# Patient Record
Sex: Male | Born: 1945
Health system: Southern US, Community
[De-identification: ages and names within clinical notes are randomized; demographics above are authoritative.]

## PROBLEM LIST (undated history)

## (undated) DIAGNOSIS — R053 Chronic cough: Secondary | ICD-10-CM

## (undated) DIAGNOSIS — U071 COVID-19: Secondary | ICD-10-CM

## (undated) DIAGNOSIS — C801 Malignant (primary) neoplasm, unspecified: Secondary | ICD-10-CM

## (undated) DIAGNOSIS — I499 Cardiac arrhythmia, unspecified: Secondary | ICD-10-CM

## (undated) DIAGNOSIS — R05 Cough: Secondary | ICD-10-CM

## (undated) HISTORY — DX: Chronic cough: R05.3

## (undated) HISTORY — PX: SKIN SURGERY: SHX2413

## (undated) HISTORY — PX: HERNIA REPAIR: SHX51

---
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
## 2010-06-28 HISTORY — PX: ACHILLES TENDON REPAIR: SUR1153

## 2015-04-03 DIAGNOSIS — R69 Illness, unspecified: Secondary | ICD-10-CM | POA: Diagnosis not present

## 2015-09-01 DIAGNOSIS — M25562 Pain in left knee: Secondary | ICD-10-CM | POA: Diagnosis not present

## 2015-09-01 DIAGNOSIS — G8929 Other chronic pain: Secondary | ICD-10-CM | POA: Diagnosis not present

## 2015-10-02 DIAGNOSIS — R69 Illness, unspecified: Secondary | ICD-10-CM | POA: Diagnosis not present

## 2015-11-09 DIAGNOSIS — R062 Wheezing: Secondary | ICD-10-CM | POA: Diagnosis not present

## 2015-11-09 DIAGNOSIS — J069 Acute upper respiratory infection, unspecified: Secondary | ICD-10-CM | POA: Diagnosis not present

## 2015-11-09 DIAGNOSIS — J209 Acute bronchitis, unspecified: Secondary | ICD-10-CM | POA: Diagnosis not present

## 2015-12-01 DIAGNOSIS — M25562 Pain in left knee: Secondary | ICD-10-CM

## 2015-12-01 DIAGNOSIS — M1712 Unilateral primary osteoarthritis, left knee: Secondary | ICD-10-CM | POA: Diagnosis not present

## 2015-12-01 DIAGNOSIS — G8929 Other chronic pain: Secondary | ICD-10-CM | POA: Insufficient documentation

## 2015-12-01 HISTORY — DX: Other chronic pain: G89.29

## 2015-12-01 HISTORY — DX: Pain in left knee: M25.562

## 2016-02-08 DIAGNOSIS — R05 Cough: Secondary | ICD-10-CM | POA: Diagnosis not present

## 2016-04-22 DIAGNOSIS — I10 Essential (primary) hypertension: Secondary | ICD-10-CM | POA: Diagnosis not present

## 2016-04-22 DIAGNOSIS — E785 Hyperlipidemia, unspecified: Secondary | ICD-10-CM | POA: Diagnosis not present

## 2016-04-22 DIAGNOSIS — Z9181 History of falling: Secondary | ICD-10-CM | POA: Diagnosis not present

## 2016-04-22 DIAGNOSIS — Z23 Encounter for immunization: Secondary | ICD-10-CM | POA: Diagnosis not present

## 2016-04-22 DIAGNOSIS — Z1389 Encounter for screening for other disorder: Secondary | ICD-10-CM | POA: Diagnosis not present

## 2016-04-22 DIAGNOSIS — Z79899 Other long term (current) drug therapy: Secondary | ICD-10-CM | POA: Diagnosis not present

## 2016-04-22 DIAGNOSIS — Z0000 Encounter for general adult medical examination without abnormal findings: Secondary | ICD-10-CM | POA: Diagnosis not present

## 2016-04-22 DIAGNOSIS — D510 Vitamin B12 deficiency anemia due to intrinsic factor deficiency: Secondary | ICD-10-CM | POA: Diagnosis not present

## 2016-09-02 DIAGNOSIS — M1712 Unilateral primary osteoarthritis, left knee: Secondary | ICD-10-CM | POA: Diagnosis not present

## 2016-09-02 DIAGNOSIS — G8929 Other chronic pain: Secondary | ICD-10-CM | POA: Diagnosis not present

## 2016-10-26 DIAGNOSIS — R69 Illness, unspecified: Secondary | ICD-10-CM | POA: Diagnosis not present

## 2017-03-30 DIAGNOSIS — S79911A Unspecified injury of right hip, initial encounter: Secondary | ICD-10-CM | POA: Diagnosis not present

## 2017-03-30 DIAGNOSIS — S39012A Strain of muscle, fascia and tendon of lower back, initial encounter: Secondary | ICD-10-CM | POA: Diagnosis not present

## 2017-03-30 DIAGNOSIS — S3992XA Unspecified injury of lower back, initial encounter: Secondary | ICD-10-CM | POA: Diagnosis not present

## 2017-03-30 DIAGNOSIS — S79912A Unspecified injury of left hip, initial encounter: Secondary | ICD-10-CM | POA: Diagnosis not present

## 2017-03-30 DIAGNOSIS — W03XXXA Other fall on same level due to collision with another person, initial encounter: Secondary | ICD-10-CM | POA: Diagnosis not present

## 2017-03-30 DIAGNOSIS — M62830 Muscle spasm of back: Secondary | ICD-10-CM | POA: Diagnosis not present

## 2017-04-06 DIAGNOSIS — S060X9A Concussion with loss of consciousness of unspecified duration, initial encounter: Secondary | ICD-10-CM | POA: Diagnosis not present

## 2017-05-25 DIAGNOSIS — Z79899 Other long term (current) drug therapy: Secondary | ICD-10-CM | POA: Diagnosis not present

## 2017-05-25 DIAGNOSIS — E785 Hyperlipidemia, unspecified: Secondary | ICD-10-CM | POA: Diagnosis not present

## 2017-05-25 DIAGNOSIS — Z0000 Encounter for general adult medical examination without abnormal findings: Secondary | ICD-10-CM | POA: Diagnosis not present

## 2017-05-25 DIAGNOSIS — Z125 Encounter for screening for malignant neoplasm of prostate: Secondary | ICD-10-CM | POA: Diagnosis not present

## 2017-05-26 DIAGNOSIS — R69 Illness, unspecified: Secondary | ICD-10-CM | POA: Diagnosis not present

## 2017-06-01 DIAGNOSIS — Z0000 Encounter for general adult medical examination without abnormal findings: Secondary | ICD-10-CM | POA: Diagnosis not present

## 2017-06-01 DIAGNOSIS — Z1331 Encounter for screening for depression: Secondary | ICD-10-CM | POA: Diagnosis not present

## 2017-06-01 DIAGNOSIS — Z23 Encounter for immunization: Secondary | ICD-10-CM | POA: Diagnosis not present

## 2017-06-01 DIAGNOSIS — Z9181 History of falling: Secondary | ICD-10-CM | POA: Diagnosis not present

## 2017-06-01 DIAGNOSIS — Z6837 Body mass index (BMI) 37.0-37.9, adult: Secondary | ICD-10-CM | POA: Diagnosis not present

## 2017-06-01 DIAGNOSIS — Z1339 Encounter for screening examination for other mental health and behavioral disorders: Secondary | ICD-10-CM | POA: Diagnosis not present

## 2017-10-12 DIAGNOSIS — Z7982 Long term (current) use of aspirin: Secondary | ICD-10-CM | POA: Diagnosis not present

## 2017-10-12 DIAGNOSIS — J189 Pneumonia, unspecified organism: Secondary | ICD-10-CM | POA: Diagnosis not present

## 2017-10-12 DIAGNOSIS — J309 Allergic rhinitis, unspecified: Secondary | ICD-10-CM | POA: Diagnosis not present

## 2017-10-12 DIAGNOSIS — I10 Essential (primary) hypertension: Secondary | ICD-10-CM | POA: Diagnosis not present

## 2017-10-12 DIAGNOSIS — R05 Cough: Secondary | ICD-10-CM | POA: Diagnosis not present

## 2018-04-01 DIAGNOSIS — J668 Airway disease due to other specific organic dusts: Secondary | ICD-10-CM | POA: Diagnosis not present

## 2018-05-29 DIAGNOSIS — Z23 Encounter for immunization: Secondary | ICD-10-CM | POA: Diagnosis not present

## 2018-06-01 DIAGNOSIS — R69 Illness, unspecified: Secondary | ICD-10-CM | POA: Diagnosis not present

## 2018-11-29 DIAGNOSIS — R69 Illness, unspecified: Secondary | ICD-10-CM | POA: Diagnosis not present

## 2018-11-30 DIAGNOSIS — R69 Illness, unspecified: Secondary | ICD-10-CM | POA: Diagnosis not present

## 2018-12-13 DIAGNOSIS — R69 Illness, unspecified: Secondary | ICD-10-CM | POA: Diagnosis not present

## 2018-12-26 DIAGNOSIS — R69 Illness, unspecified: Secondary | ICD-10-CM | POA: Diagnosis not present

## 2019-01-18 DIAGNOSIS — R69 Illness, unspecified: Secondary | ICD-10-CM | POA: Diagnosis not present

## 2019-04-23 DIAGNOSIS — Z23 Encounter for immunization: Secondary | ICD-10-CM | POA: Diagnosis not present

## 2019-07-08 DIAGNOSIS — I10 Essential (primary) hypertension: Secondary | ICD-10-CM | POA: Diagnosis not present

## 2019-07-08 DIAGNOSIS — Z79899 Other long term (current) drug therapy: Secondary | ICD-10-CM | POA: Diagnosis not present

## 2019-07-08 DIAGNOSIS — R9431 Abnormal electrocardiogram [ECG] [EKG]: Secondary | ICD-10-CM | POA: Diagnosis not present

## 2019-07-08 DIAGNOSIS — R0602 Shortness of breath: Secondary | ICD-10-CM | POA: Diagnosis not present

## 2019-07-08 DIAGNOSIS — J1282 Pneumonia due to coronavirus disease 2019: Secondary | ICD-10-CM | POA: Diagnosis not present

## 2019-07-08 DIAGNOSIS — R0902 Hypoxemia: Secondary | ICD-10-CM | POA: Diagnosis not present

## 2019-07-08 DIAGNOSIS — J1289 Other viral pneumonia: Secondary | ICD-10-CM | POA: Diagnosis not present

## 2019-07-08 DIAGNOSIS — U071 COVID-19: Secondary | ICD-10-CM | POA: Diagnosis not present

## 2019-07-08 DIAGNOSIS — M1990 Unspecified osteoarthritis, unspecified site: Secondary | ICD-10-CM | POA: Diagnosis not present

## 2019-07-08 DIAGNOSIS — E871 Hypo-osmolality and hyponatremia: Secondary | ICD-10-CM | POA: Diagnosis not present

## 2019-07-08 DIAGNOSIS — Z6837 Body mass index (BMI) 37.0-37.9, adult: Secondary | ICD-10-CM | POA: Diagnosis not present

## 2019-07-08 DIAGNOSIS — R05 Cough: Secondary | ICD-10-CM | POA: Diagnosis not present

## 2019-07-08 DIAGNOSIS — E669 Obesity, unspecified: Secondary | ICD-10-CM | POA: Diagnosis not present

## 2019-07-08 DIAGNOSIS — I4891 Unspecified atrial fibrillation: Secondary | ICD-10-CM | POA: Diagnosis not present

## 2019-07-09 DIAGNOSIS — E871 Hypo-osmolality and hyponatremia: Secondary | ICD-10-CM | POA: Diagnosis not present

## 2019-07-09 DIAGNOSIS — I4891 Unspecified atrial fibrillation: Secondary | ICD-10-CM | POA: Diagnosis not present

## 2019-07-09 DIAGNOSIS — R0902 Hypoxemia: Secondary | ICD-10-CM | POA: Diagnosis not present

## 2019-07-09 DIAGNOSIS — E669 Obesity, unspecified: Secondary | ICD-10-CM | POA: Diagnosis not present

## 2019-07-09 DIAGNOSIS — U071 COVID-19: Secondary | ICD-10-CM | POA: Diagnosis not present

## 2019-07-09 DIAGNOSIS — I10 Essential (primary) hypertension: Secondary | ICD-10-CM | POA: Diagnosis not present

## 2019-07-18 DIAGNOSIS — Z9119 Patient's noncompliance with other medical treatment and regimen: Secondary | ICD-10-CM | POA: Diagnosis not present

## 2019-07-18 DIAGNOSIS — U071 COVID-19: Secondary | ICD-10-CM | POA: Diagnosis not present

## 2019-07-18 DIAGNOSIS — I10 Essential (primary) hypertension: Secondary | ICD-10-CM | POA: Diagnosis not present

## 2019-07-18 DIAGNOSIS — Y708 Miscellaneous anesthesiology devices associated with adverse incidents, not elsewhere classified: Secondary | ICD-10-CM | POA: Diagnosis not present

## 2019-07-18 DIAGNOSIS — Z6837 Body mass index (BMI) 37.0-37.9, adult: Secondary | ICD-10-CM | POA: Diagnosis not present

## 2019-07-18 DIAGNOSIS — I4891 Unspecified atrial fibrillation: Secondary | ICD-10-CM | POA: Diagnosis not present

## 2019-07-25 ENCOUNTER — Other Ambulatory Visit: Payer: Self-pay

## 2019-07-25 ENCOUNTER — Encounter: Payer: Self-pay | Admitting: *Deleted

## 2019-07-25 ENCOUNTER — Ambulatory Visit (INDEPENDENT_AMBULATORY_CARE_PROVIDER_SITE_OTHER): Payer: Medicare HMO | Admitting: Cardiology

## 2019-07-25 VITALS — BP 138/86 | HR 112 | Ht 71.5 in | Wt 264.0 lb

## 2019-07-25 DIAGNOSIS — I4811 Longstanding persistent atrial fibrillation: Secondary | ICD-10-CM | POA: Diagnosis not present

## 2019-07-25 DIAGNOSIS — I10 Essential (primary) hypertension: Secondary | ICD-10-CM | POA: Diagnosis not present

## 2019-07-25 NOTE — Progress Notes (Signed)
Cardiology Office Note:    Date:  07/25/2019   ID:  [name], DOB March 09, 1946, MRN [id number]  PCP:  Angelina Sheriff, MD  Cardiologist:  Berniece Salines, DO  Electrophysiologist:  None   Referring MD: Angelina Sheriff, MD   Chief Complaint  Patient presents with  . Irregular Heart Beat    History of Present Illness:    Thomas Knight is a 74 y.o. male with a hx of atrial fibrillation (recently diagnosed now on metoprolol tartrate 25 mg twice a day with Eliquis 5 mg twice daily), hypertension, recently COVID-19 infection presents today to be evaluated postoperatively.  Patient initially presented to [facility] on July 08, 2019 to be evaluated for tenuous coughing.  He was found to be positive for COVID-19.  In addition he was noted to be in atrial fibrillation.  He was seen by cardiology who recommended patient get a transthoracic echocardiogram and was treated for rate control and anticoagulated for atrial fibrillation.  He is here today to establish cardiac care.  Past Medical History:  Diagnosis Date  . Chronic cough   . Chronic pain of left knee 12/01/2015    Past Surgical History:  Procedure Laterality Date  . ACHILLES TENDON REPAIR Left 2012   Ruptured  . HERNIA REPAIR     umbilical hernia  . SKIN SURGERY Left    Basal cell carcinoma on left forearm    Current Medications: Current Meds  Medication Sig  . Ascorbic Acid (VITAMIN C WITH ROSE HIPS) 500 MG tablet Take 500 mg by mouth daily.  Marland Kitchen aspirin 81 MG EC tablet Take 81 mg by mouth daily.  . chlorpheniramine-HYDROcodone (TUSSIONEX) 10-8 MG/5ML SUER Take 5 mLs by mouth 2 (two) times daily as needed.  . Cholecalciferol (D3-1000) 25 MCG (1000 UT) capsule Take 1,000 Units by mouth daily.  Marland Kitchen dextromethorphan-guaiFENesin (ROBITUSSIN-DM) 10-100 MG/5ML liquid Take 10 mLs by mouth every 4 (four) hours as needed for cough.  Marland Kitchen ELIQUIS 5 MG TABS tablet Take 5 mg by mouth 2 (two) times daily.  . metoprolol  tartrate (LOPRESSOR) 25 MG tablet Take 25 mg by mouth 2 (two) times daily.  . Zinc 50 MG CAPS Take by mouth.     Allergies:   Patient has no known allergies.   Social History   Socioeconomic History  . Marital status: Unknown    Spouse name: Not on file  . Number of children: Not on file  . Years of education: Not on file  . Highest education level: Not on file  Occupational History  . Not on file  Tobacco Use  . Smoking status: Never Smoker  . Smokeless tobacco: Never Used  Substance and Sexual Activity  . Alcohol use: Never  . Drug use: Never  . Sexual activity: Not on file  Other Topics Concern  . Not on file  Social History Narrative  . Not on file   Social Determinants of Health   Financial Resource Strain:   . Difficulty of Paying Living Expenses: Not on file  Food Insecurity:   . Worried About Charity fundraiser in the Last Year: Not on file  . Ran Out of Food in the Last Year: Not on file  Transportation Needs:   . Lack of Transportation (Medical): Not on file  . Lack of Transportation (Non-Medical): Not on file  Physical Activity:   . Days of Exercise per Week: Not on file  . Minutes of Exercise per Session: Not on file  Stress:   . Feeling of Stress : Not on file  Social Connections:   . Frequency of Communication with Friends and Family: Not on file  . Frequency of Social Gatherings with Friends and Family: Not on file  . Attends Religious Services: Not on file  . Active Member of Clubs or Organizations: Not on file  . Attends Archivist Meetings: Not on file  . Marital Status: Not on file     Family History: The patient's family history includes Lung cancer in his father.  ROS:   Review of Systems  Constitution: Negative for decreased appetite, fever and weight gain.  HENT: Negative for congestion, ear discharge, hoarse voice and sore throat.   Eyes: Negative for discharge, redness, vision loss in right eye and visual halos.    Cardiovascular: Negative for chest pain, dyspnea on exertion, leg swelling, orthopnea and palpitations.  Respiratory: Negative for cough, hemoptysis, shortness of breath and snoring.   Endocrine: Negative for heat intolerance and polyphagia.  Hematologic/Lymphatic: Negative for bleeding problem. Does not bruise/bleed easily.  Skin: Negative for flushing, nail changes, rash and suspicious lesions.  Musculoskeletal: Negative for arthritis, joint pain, muscle cramps, myalgias, neck pain and stiffness.  Gastrointestinal: Negative for abdominal pain, bowel incontinence, diarrhea and excessive appetite.  Genitourinary: Negative for decreased libido, genital sores and incomplete emptying.  Neurological: Negative for brief paralysis, focal weakness, headaches and loss of balance.  Psychiatric/Behavioral: Negative for altered mental status, depression and suicidal ideas.  Allergic/Immunologic: Negative for HIV exposure and persistent infections.    EKGs/Labs/Other Studies Reviewed:    The following studies were reviewed today:   EKG:  The ekg ordered today demonstrates atrial fibrillation, with controlled ventricular rate, heart rate 78 bpm-compared to his EKG from [facility] no significant change.  Transthoracic echocardiogram normal LV function EF 55 to 60%.  Right ventricle is mildly enlarged.  Left atrium is moderately dilated.  There is mild aortic valve sclerosis.  There is trace mitral vegetation.  Mild tricuspid regurgitation.   Recent Labs: No results found for requested labs within last 8760 hours.  Recent Lipid Panel No results found for: CHOL, TRIG, HDL, CHOLHDL, VLDL, LDLCALC, LDLDIRECT  Physical Exam:    VS:  BP 138/86 (BP Location: Right Arm, Patient Position: Sitting, Cuff Size: Normal)   Pulse (!) 112   Ht 5' 11.5" (1.816 m)   Wt 264 lb (119.7 kg)   SpO2 100%   BMI 36.31 kg/m     Wt Readings from Last 3 Encounters:  07/25/19 264 lb (119.7 kg)     GEN: Very  pleasant, obese, well nourished, well developed in no acute distress HEENT: Normal NECK: No JVD; No carotid bruits LYMPHATICS: No lymphadenopathy CARDIAC: S1S2 noted,RRR, no murmurs, rubs, gallops RESPIRATORY:  Clear to auscultation without rales, wheezing or rhonchi  ABDOMEN: Soft, non-tender, non-distended, +bowel sounds, no guarding. EXTREMITIES: No edema, No cyanosis, no clubbing MUSCULOSKELETAL:  No deformity  SKIN: Warm and dry NEUROLOGIC:  Alert and oriented x 3, non-focal PSYCHIATRIC:  Normal affect, good insight  ASSESSMENT:    1. Longstanding persistent atrial fibrillation ([place])   2. Essential hypertension    PLAN:     1.  Atrial fibrillation-discussed heavily with the patient today management for his A. fib currently he is on rate control strategy with his Lopressor 25 mg daily.  He is also being anticoagulated with Eliquis 5 mg twice daily.  I have educated patient on the side effects of this medication. I also urge  him to abstain from any taking behaviors.  He understands that he is now at a high risk of bleeding due to being on anticoagulation.  He was also advised that if he ever falls and especially hit his head to be seen in the emergency department.  In addition we discussed rhythm control strategy.  The goal will be to keep patient in sinus rhythm.  For now I would like to first perform a DC cardioversion since he has been anticoagulated and by the time he undergo his procedure he would have been anticoagulated for a month.  After DC cardioversion arrhythmics will be started.  For now the patient will prefer to discuss this with his wife and adult children prior to undergoing any procedure.  This is not unreasonable.  2 hypertension.  We discussed his blood pressure was slightly elevated and for now he will decrease salt intake and we will reassess this in 1 month at his follow-up visit if he is still not at his target of less less than 130/80 we will proceed with giving  in addition antihypertensive.  The patient is in agreement with the above plan. The patient left the office in stable condition.  The patient will follow up in 1 month or sooner if needed.   Medication Adjustments/Labs and Tests Ordered: Current medicines are reviewed at length with the patient today.  Concerns regarding medicines are outlined above.  Orders Placed This Encounter  Procedures  . EKG 12-Lead   No orders of the defined types were placed in this encounter.   Patient Instructions  Medication Instructions:  Your physician recommends that you continue on your current medications as directed. Please refer to the Current Medication list given to you today.  *If you need a refill on your cardiac medications before your next appointment, please call your pharmacy*  Lab Work: None If you have labs (blood work) drawn today and your tests are completely normal, you will receive your results only by: Marland Kitchen MyChart Message (if you have MyChart) OR . A paper copy in the mail If you have any lab test that is abnormal or we need to change your treatment, we will call you to review the results.  Testing/Procedures: Your physician has recommended that you have a Cardioversion (DCCV). Electrical Cardioversion uses a jolt of electricity to your heart either through paddles or wired patches attached to your chest. This is a controlled, usually prescheduled, procedure. Defibrillation is done under light anesthesia in the hospital, and you usually go home the day of the procedure. This is done to get your heart back into a normal rhythm. You are not awake for the procedure. Please see the instruction sheet given to you today.    Follow-Up: At [facility], you and your health needs are our priority.  As part of our continuing mission to provide you with exceptional heart care, we have created designated Provider Care Teams.  These Care Teams include your primary Cardiologist (physician) and  Advanced Practice Providers (APPs -  Physician Assistants and Nurse Practitioners) who all work together to provide you with the care you need, when you need it.  Your next appointment:   1 month(s)  The format for your next appointment:   In Person  Provider:   Berniece Salines, DO  Other Instructions  Electrical Cardioversion Electrical cardioversion is the delivery of a jolt of electricity to restore a normal rhythm to the heart. A rhythm that is too fast or is not regular keeps  the heart from pumping well. In this procedure, sticky patches or metal paddles are placed on the chest to deliver electricity to the heart from a device. This procedure may be done in an emergency if:  There is low or no blood pressure as a result of the heart rhythm.  Normal rhythm must be restored as fast as possible to protect the brain and heart from further damage.  It may save a life. This may also be a scheduled procedure for irregular or fast heart rhythms that are not immediately life-threatening. Tell a health care provider about:  Any allergies you have.  All medicines you are taking, including vitamins, herbs, eye drops, creams, and over-the-counter medicines.  Any problems you or family members have had with anesthetic medicines.  Any blood disorders you have.  Any surgeries you have had.  Any medical conditions you have.  Whether you are pregnant or may be pregnant. What are the risks? Generally, this is a safe procedure. However, problems may occur, including:  Allergic reactions to medicines.  A blood clot that breaks free and travels to other parts of your body.  The possible return of an abnormal heart rhythm within hours or days after the procedure.  Your heart stopping (cardiac arrest). This is rare. What happens before the procedure? Medicines  Your health care provider may have you start taking: ? Blood-thinning medicines (anticoagulants) so your blood does not clot as  easily. ? Medicines to help stabilize your heart rate and rhythm.  Ask your health care provider about: ? Changing or stopping your regular medicines. This is especially important if you are taking diabetes medicines or blood thinners. ? Taking medicines such as aspirin and ibuprofen. These medicines can thin your blood. Do not take these medicines unless your health care provider tells you to take them. ? Taking over-the-counter medicines, vitamins, herbs, and supplements. General instructions  Follow instructions from your health care provider about eating or drinking restrictions.  Plan to have someone take you home from the hospital or clinic.  If you will be going home right after the procedure, plan to have someone with you for 24 hours.  Ask your health care provider what steps will be taken to help prevent infection. These may include washing your skin with a germ-killing soap. What happens during the procedure?   An IV will be inserted into one of your veins.  Sticky patches (electrodes) or metal paddles may be placed on your chest.  You will be given a medicine to help you relax (sedative).  An electrical shock will be delivered. The procedure may vary among health care providers and hospitals. What can I expect after the procedure?  Your blood pressure, heart rate, breathing rate, and blood oxygen level will be monitored until you leave the hospital or clinic.  Your heart rhythm will be watched to make sure it does not change.  You may have some redness on the skin where the shocks were given. Follow these instructions at home:  Do not drive for 24 hours if you were given a sedative during your procedure.  Take over-the-counter and prescription medicines only as told by your health care provider.  Ask your health care provider how to check your pulse. Check it often.  Rest for 48 hours after the procedure or as told by your health care provider.  Avoid or limit  your caffeine use as told by your health care provider.  Keep all follow-up visits as told by your  health care provider. This is important. Contact a health care provider if:  You feel like your heart is beating too quickly or your pulse is not regular.  You have a serious muscle cramp that does not go away. Get help right away if:  You have discomfort in your chest.  You are dizzy or you feel faint.  You have trouble breathing or you are short of breath.  Your speech is slurred.  You have trouble moving an arm or leg on one side of your body.  Your fingers or toes turn cold or blue. Summary  Electrical cardioversion is the delivery of a jolt of electricity to restore a normal rhythm to the heart.  This procedure may be done right away in an emergency or may be a scheduled procedure if the condition is not an emergency.  Generally, this is a safe procedure.  After the procedure, check your pulse often as told by your health care provider. This information is not intended to replace advice given to you by your health care provider. Make sure you discuss any questions you have with your health care provider. Document Revised: 01/15/2019 Document Reviewed: 01/15/2019 Elsevier Patient Education  [place].      Adopting a Healthy Lifestyle.  Know what a healthy weight is for you (roughly BMI <25) and aim to maintain this   Aim for 7+ servings of fruits and vegetables daily   65-80+ fluid ounces of water or unsweet tea for healthy kidneys   Limit to max 1 drink of alcohol per day; avoid smoking/tobacco   Limit animal fats in diet for cholesterol and heart health - choose grass fed whenever available   Avoid highly processed foods, and foods high in saturated/trans fats   Aim for low stress - take time to unwind and care for your mental health   Aim for 150 min of moderate intensity exercise weekly for heart health, and weights twice weekly for bone health     Aim for 7-9 hours of sleep daily   When it comes to diets, agreement about the perfect plan isnt easy to find, even among the experts. Experts at the [employer] developed an idea known as the Healthy Eating Plate. Just imagine a plate divided into logical, healthy portions.   The emphasis is on diet quality:   Load up on vegetables and fruits - one-half of your plate: Aim for color and variety, and remember that potatoes dont count.   Go for whole grains - one-quarter of your plate: Whole wheat, barley, wheat berries, quinoa, oats, brown rice, and foods made with them. If you want pasta, go with whole wheat pasta.   Protein power - one-quarter of your plate: Fish, chicken, beans, and nuts are all healthy, versatile protein sources. Limit red meat.   The diet, however, does go beyond the plate, offering a few other suggestions.   Use healthy plant oils, such as olive, canola, soy, corn, sunflower and peanut. Check the labels, and avoid partially hydrogenated oil, which have unhealthy trans fats.   If youre thirsty, drink water. Coffee and tea are good in moderation, but skip sugary drinks and limit milk and dairy products to one or two daily servings.   The type of carbohydrate in the diet is more important than the amount. Some sources of carbohydrates, such as vegetables, fruits, whole grains, and beans-are healthier than others.   Finally, stay active  Signed, Berniece Salines, DO  07/25/2019  10:20 AM    [facility]

## 2019-07-25 NOTE — Patient Instructions (Signed)
Medication Instructions:  Your physician recommends that you continue on your current medications as directed. Please refer to the Current Medication list given to you today.  *If you need a refill on your cardiac medications before your next appointment, please call your pharmacy*  Lab Work: None If you have labs (blood work) drawn today and your tests are completely normal, you will receive your results only by: Marland Kitchen MyChart Message (if you have MyChart) OR . A paper copy in the mail If you have any lab test that is abnormal or we need to change your treatment, we will call you to review the results.  Testing/Procedures: Your physician has recommended that you have a Cardioversion (DCCV). Electrical Cardioversion uses a jolt of electricity to your heart either through paddles or wired patches attached to your chest. This is a controlled, usually prescheduled, procedure. Defibrillation is done under light anesthesia in the hospital, and you usually go home the day of the procedure. This is done to get your heart back into a normal rhythm. You are not awake for the procedure. Please see the instruction sheet given to you today.    Follow-Up: At [facility], you and your health needs are our priority.  As part of our continuing mission to provide you with exceptional heart care, we have created designated Provider Care Teams.  These Care Teams include your primary Cardiologist (physician) and Advanced Practice Providers (APPs -  Physician Assistants and Nurse Practitioners) who all work together to provide you with the care you need, when you need it.  Your next appointment:   1 month(s)  The format for your next appointment:   In Person  Provider:   Berniece Salines, DO  Other Instructions  Electrical Cardioversion Electrical cardioversion is the delivery of a jolt of electricity to restore a normal rhythm to the heart. A rhythm that is too fast or is not regular keeps the heart from  pumping well. In this procedure, sticky patches or metal paddles are placed on the chest to deliver electricity to the heart from a device. This procedure may be done in an emergency if:  There is low or no blood pressure as a result of the heart rhythm.  Normal rhythm must be restored as fast as possible to protect the brain and heart from further damage.  It may save a life. This may also be a scheduled procedure for irregular or fast heart rhythms that are not immediately life-threatening. Tell a health care provider about:  Any allergies you have.  All medicines you are taking, including vitamins, herbs, eye drops, creams, and over-the-counter medicines.  Any problems you or family members have had with anesthetic medicines.  Any blood disorders you have.  Any surgeries you have had.  Any medical conditions you have.  Whether you are pregnant or may be pregnant. What are the risks? Generally, this is a safe procedure. However, problems may occur, including:  Allergic reactions to medicines.  A blood clot that breaks free and travels to other parts of your body.  The possible return of an abnormal heart rhythm within hours or days after the procedure.  Your heart stopping (cardiac arrest). This is rare. What happens before the procedure? Medicines  Your health care provider may have you start taking: ? Blood-thinning medicines (anticoagulants) so your blood does not clot as easily. ? Medicines to help stabilize your heart rate and rhythm.  Ask your health care provider about: ? Changing or stopping your regular medicines.  This is especially important if you are taking diabetes medicines or blood thinners. ? Taking medicines such as aspirin and ibuprofen. These medicines can thin your blood. Do not take these medicines unless your health care provider tells you to take them. ? Taking over-the-counter medicines, vitamins, herbs, and supplements. General instructions   Follow instructions from your health care provider about eating or drinking restrictions.  Plan to have someone take you home from the hospital or clinic.  If you will be going home right after the procedure, plan to have someone with you for 24 hours.  Ask your health care provider what steps will be taken to help prevent infection. These may include washing your skin with a germ-killing soap. What happens during the procedure?   An IV will be inserted into one of your veins.  Sticky patches (electrodes) or metal paddles may be placed on your chest.  You will be given a medicine to help you relax (sedative).  An electrical shock will be delivered. The procedure may vary among health care providers and hospitals. What can I expect after the procedure?  Your blood pressure, heart rate, breathing rate, and blood oxygen level will be monitored until you leave the hospital or clinic.  Your heart rhythm will be watched to make sure it does not change.  You may have some redness on the skin where the shocks were given. Follow these instructions at home:  Do not drive for 24 hours if you were given a sedative during your procedure.  Take over-the-counter and prescription medicines only as told by your health care provider.  Ask your health care provider how to check your pulse. Check it often.  Rest for 48 hours after the procedure or as told by your health care provider.  Avoid or limit your caffeine use as told by your health care provider.  Keep all follow-up visits as told by your health care provider. This is important. Contact a health care provider if:  You feel like your heart is beating too quickly or your pulse is not regular.  You have a serious muscle cramp that does not go away. Get help right away if:  You have discomfort in your chest.  You are dizzy or you feel faint.  You have trouble breathing or you are short of breath.  Your speech is slurred.  You  have trouble moving an arm or leg on one side of your body.  Your fingers or toes turn cold or blue. Summary  Electrical cardioversion is the delivery of a jolt of electricity to restore a normal rhythm to the heart.  This procedure may be done right away in an emergency or may be a scheduled procedure if the condition is not an emergency.  Generally, this is a safe procedure.  After the procedure, check your pulse often as told by your health care provider. This information is not intended to replace advice given to you by your health care provider. Make sure you discuss any questions you have with your health care provider. Document Revised: 01/15/2019 Document Reviewed: 01/15/2019 Elsevier Patient Education  [place].

## 2019-07-30 ENCOUNTER — Telehealth: Payer: Self-pay | Admitting: *Deleted

## 2019-07-30 ENCOUNTER — Other Ambulatory Visit: Payer: Self-pay | Admitting: *Deleted

## 2019-07-30 DIAGNOSIS — I4811 Longstanding persistent atrial fibrillation: Secondary | ICD-10-CM

## 2019-07-30 DIAGNOSIS — Z01812 Encounter for preprocedural laboratory examination: Secondary | ICD-10-CM

## 2019-07-30 NOTE — Telephone Encounter (Signed)
Telephone call to patient. Talked to him about setting up cardioversion next week while Dr Harriet Masson is in the hospital. He needs labs prior to the procedure. He will come tomorrow(07/31/19) and have a CBC and BMP drawn

## 2019-08-01 DIAGNOSIS — Z01812 Encounter for preprocedural laboratory examination: Secondary | ICD-10-CM | POA: Diagnosis not present

## 2019-08-01 DIAGNOSIS — I4811 Longstanding persistent atrial fibrillation: Secondary | ICD-10-CM | POA: Diagnosis not present

## 2019-08-01 DIAGNOSIS — Z03818 Encounter for observation for suspected exposure to other biological agents ruled out: Secondary | ICD-10-CM | POA: Diagnosis not present

## 2019-08-02 LAB — CBC
Hematocrit: 47.4 % (ref 37.5–51.0)
Hemoglobin: 16.5 g/dL (ref 13.0–17.7)
MCH: 31.4 pg (ref 26.6–33.0)
MCHC: 34.8 g/dL (ref 31.5–35.7)
MCV: 90 fL (ref 79–97)
Platelets: 198 10*3/uL (ref 150–450)
RBC: 5.25 x10E6/uL (ref 4.14–5.80)
RDW: 13 % (ref 11.6–15.4)
WBC: 6.2 10*3/uL (ref 3.4–10.8)

## 2019-08-02 LAB — BASIC METABOLIC PANEL
BUN/Creatinine Ratio: 13 (ref 10–24)
BUN: 13 mg/dL (ref 8–27)
CO2: 23 mmol/L (ref 20–29)
Calcium: 9 mg/dL (ref 8.6–10.2)
Chloride: 105 mmol/L (ref 96–106)
Creatinine, Ser: 1.01 mg/dL (ref 0.76–1.27)
GFR calc Af Amer: 84 mL/min/{1.73_m2} (ref >59)
GFR calc non Af Amer: 73 mL/min/{1.73_m2} (ref >59)
Glucose: 82 mg/dL (ref 65–99)
Potassium: 4.2 mmol/L (ref 3.5–5.2)
Sodium: 142 mmol/L (ref 134–144)

## 2019-08-02 NOTE — Telephone Encounter (Signed)
Telephone call to patient. Informed of lab results and copy sent to [place] for [place] on Thurs 10/07/19 at 9 AM. Informed patient to arrive at hospital at [facility].

## 2019-08-07 ENCOUNTER — Telehealth: Payer: Self-pay | Admitting: *Deleted

## 2019-08-07 NOTE — Telephone Encounter (Signed)
Called patient. Verified date and time of cardioversion at [facility] on 08/09/2019 at [place]. Pt to arrive at 7:30 Am at the [facility] per [place] in the [place]. Informed him that someone from [place] will call and go over pre instructions. No further questions

## 2019-08-08 ENCOUNTER — Telehealth: Payer: Self-pay | Admitting: Cardiology

## 2019-08-08 NOTE — Telephone Encounter (Signed)
New Message    Thomas Knight is calling and says the Covid results are not available yet and they need them before proceeding with the cardioversion     Please advise

## 2019-08-24 ENCOUNTER — Other Ambulatory Visit: Payer: Self-pay

## 2019-08-24 ENCOUNTER — Ambulatory Visit (INDEPENDENT_AMBULATORY_CARE_PROVIDER_SITE_OTHER): Payer: Medicare HMO | Admitting: Cardiology

## 2019-08-24 ENCOUNTER — Encounter: Payer: Self-pay | Admitting: Cardiology

## 2019-08-24 VITALS — BP 130/90 | HR 53 | Ht 71.5 in | Wt 270.0 lb

## 2019-08-24 DIAGNOSIS — E782 Mixed hyperlipidemia: Secondary | ICD-10-CM

## 2019-08-24 DIAGNOSIS — R600 Localized edema: Secondary | ICD-10-CM

## 2019-08-24 DIAGNOSIS — I10 Essential (primary) hypertension: Secondary | ICD-10-CM | POA: Insufficient documentation

## 2019-08-24 DIAGNOSIS — Z01818 Encounter for other preprocedural examination: Secondary | ICD-10-CM | POA: Diagnosis not present

## 2019-08-24 DIAGNOSIS — R0602 Shortness of breath: Secondary | ICD-10-CM | POA: Diagnosis not present

## 2019-08-24 DIAGNOSIS — I4819 Other persistent atrial fibrillation: Secondary | ICD-10-CM

## 2019-08-24 DIAGNOSIS — R0609 Other forms of dyspnea: Secondary | ICD-10-CM | POA: Insufficient documentation

## 2019-08-24 DIAGNOSIS — I4811 Longstanding persistent atrial fibrillation: Secondary | ICD-10-CM | POA: Diagnosis not present

## 2019-08-24 DIAGNOSIS — E669 Obesity, unspecified: Secondary | ICD-10-CM

## 2019-08-24 HISTORY — DX: Localized edema: R60.0

## 2019-08-24 HISTORY — DX: Mixed hyperlipidemia: E78.2

## 2019-08-24 HISTORY — DX: Other persistent atrial fibrillation: I48.19

## 2019-08-24 HISTORY — DX: Shortness of breath: R06.02

## 2019-08-24 HISTORY — DX: Essential (primary) hypertension: I10

## 2019-08-24 HISTORY — DX: Obesity, unspecified: E66.9

## 2019-08-24 LAB — BASIC METABOLIC PANEL
BUN/Creatinine Ratio: 12 (ref 10–24)
BUN: 13 mg/dL (ref 8–27)
CO2: 25 mmol/L (ref 20–29)
Calcium: 9.4 mg/dL (ref 8.6–10.2)
Chloride: 102 mmol/L (ref 96–106)
Creatinine, Ser: 1.06 mg/dL (ref 0.76–1.27)
GFR calc Af Amer: 80 mL/min/{1.73_m2} (ref >59)
GFR calc non Af Amer: 69 mL/min/{1.73_m2} (ref >59)
Glucose: 101 mg/dL — ABNORMAL HIGH (ref 65–99)
Potassium: 4.5 mmol/L (ref 3.5–5.2)
Sodium: 140 mmol/L (ref 134–144)

## 2019-08-24 LAB — MAGNESIUM: Magnesium: 2.3 mg/dL (ref 1.6–2.3)

## 2019-08-24 MED ORDER — FUROSEMIDE 40 MG PO TABS: 40.0000 mg | ORAL_TABLET | Freq: Every day | ORAL | 1 refills | Status: DC

## 2019-08-24 MED ORDER — POTASSIUM CHLORIDE CRYS ER 20 MEQ PO TBCR: 20.0000 meq | EXTENDED_RELEASE_TABLET | Freq: Every day | ORAL | 1 refills | Status: DC

## 2019-08-24 NOTE — Progress Notes (Signed)
Cardiology Office Note:    Date:  08/24/2019   ID:  Thomas Knight, DOB Nov 05, 1945, MRN [id number]  PCP:  Thomas Sheriff, MD  Cardiologist:  Thomas Salines, DO  Electrophysiologist:  None   Referring MD: Thomas Sheriff, MD   Chief Complaint  Patient presents with  . Follow-up   Follow-up for atrial fibrillation  History of Present Illness:    Thomas Knight is a 74 y.o. male with a hx of atrial fibrillation (recently diagnosed now on metoprolol tartrate 25 mg twice a day with Eliquis 5 mg twice daily), hypertension, recently COVID-19 infection presents today to be evaluated postoperatively.  Patient initially presented to [facility] on July 08, 2019 to be evaluated for tenuous coughing.  He was found to be positive for COVID-19.  In addition he was noted to be in atrial fibrillation.  He was seen by cardiology who recommended patient get a transthoracic echocardiogram and was treated for rate control and anticoagulated for atrial fibrillation.  I saw the patient on July 25, 2019 at that time he seems to be doing well post hospitalization.  He was still in atrial fibrillation therefore I discussed with the patient about rhythm control strategy.  Since he has been anticoagulated previously he was willing to undergo DC cardioversion.  We did arrange for the patient undergo his cardioversion however unfortunately the patient did not get his procedure due to missed timing of his COVID-19 testing.  He is here for follow-up visit today.  He tells me that he has been experiencing significant shortness of breath with bilateral leg edema.  He denies any chest pain  Past Medical History:  Diagnosis Date  . Chronic cough   . Chronic pain of left knee 12/01/2015    Past Surgical History:  Procedure Laterality Date  . ACHILLES TENDON REPAIR Left 2012   Ruptured  . HERNIA REPAIR     umbilical hernia  . SKIN SURGERY Left    Basal cell carcinoma on left forearm    Current  Medications: Current Meds  Medication Sig  . Ascorbic Acid (VITAMIN C WITH ROSE HIPS) 500 MG tablet Take 500 mg by mouth daily.  Marland Kitchen aspirin 81 MG EC tablet Take 81 mg by mouth daily.  . chlorpheniramine-HYDROcodone (TUSSIONEX) 10-8 MG/5ML SUER Take 5 mLs by mouth 2 (two) times daily as needed.  . Cholecalciferol (D3-1000) 25 MCG (1000 UT) capsule Take 1,000 Units by mouth daily.  Marland Kitchen dextromethorphan-guaiFENesin (ROBITUSSIN-DM) 10-100 MG/5ML liquid Take 10 mLs by mouth every 4 (four) hours as needed for cough.  Marland Kitchen ELIQUIS 5 MG TABS tablet Take 5 mg by mouth 2 (two) times daily.  . metoprolol tartrate (LOPRESSOR) 25 MG tablet Take 25 mg by mouth 2 (two) times daily.  Marland Kitchen OVER THE COUNTER MEDICATION daily. Instaflex featuring UC-II Collagen/ Relieves discomfort, improves flexibility, joint relief in 7 days  . Zinc 50 MG CAPS Take by mouth.     Allergies:   Patient has no known allergies.   Social History   Socioeconomic History  . Marital status: Married    Spouse name: Not on file  . Number of children: Not on file  . Years of education: Not on file  . Highest education level: Not on file  Occupational History  . Not on file  Tobacco Use  . Smoking status: Never Smoker  . Smokeless tobacco: Never Used  Substance and Sexual Activity  . Alcohol use: Never  . Drug use: Never  . Sexual  activity: Not on file  Other Topics Concern  . Not on file  Social History Narrative  . Not on file   Social Determinants of Health   Financial Resource Strain:   . Difficulty of Paying Living Expenses: Not on file  Food Insecurity:   . Worried About Charity fundraiser in the Last Year: Not on file  . Ran Out of Food in the Last Year: Not on file  Transportation Needs:   . Lack of Transportation (Medical): Not on file  . Lack of Transportation (Non-Medical): Not on file  Physical Activity:   . Days of Exercise per Week: Not on file  . Minutes of Exercise per Session: Not on file  Stress:   .  Feeling of Stress : Not on file  Social Connections:   . Frequency of Communication with Friends and Family: Not on file  . Frequency of Social Gatherings with Friends and Family: Not on file  . Attends Religious Services: Not on file  . Active Member of Clubs [name]: Not on file  . Attends Archivist Meetings: Not on file  . Marital Status: Not on file     Family History: The patient's family history includes Lung cancer in his father.  ROS:   Review of Systems  Constitution: Negative for decreased appetite, fever and weight gain.  HENT: Negative for congestion, ear discharge, hoarse voice and sore throat.   Eyes: Negative for discharge, redness, vision loss in right eye and visual halos.  Cardiovascular: Reports shortness of breath.  Negative for chest pain, leg swelling, orthopnea and palpitations.  Respiratory: Negative for cough, hemoptysis, shortness of breath and snoring.   Endocrine: Negative for heat intolerance and polyphagia.  Hematologic/Lymphatic: Negative for bleeding problem. Does not bruise/bleed easily.  Skin: Negative for flushing, nail changes, rash and suspicious lesions.  Musculoskeletal: Negative for arthritis, joint pain, muscle cramps, myalgias, neck pain and stiffness.  Gastrointestinal: Negative for abdominal pain, bowel incontinence, diarrhea and excessive appetite.  Genitourinary: Negative for decreased libido, genital sores and incomplete emptying.  Neurological: Negative for brief paralysis, focal weakness, headaches and loss of balance.  Psychiatric/Behavioral: Negative for altered mental status, depression and suicidal ideas.  Allergic/Immunologic: Negative for HIV exposure and persistent infections.    EKGs/Labs/Other Studies Reviewed:    The following studies were reviewed today:   EKG:  The ekg ordered today demonstrates atrial fibrillation, heart rate 66 bpm, compared to prior EKG patient remains in atrial  fibrillation.  Transthoracic echocardiogram normal LV function EF 55 to 60%.  Right ventricle is mildly enlarged.  Left atrium is moderately dilated.  There is mild aortic valve sclerosis.  There is trace mitral vegetation.  Mild tricuspid regurgitation.     Recent Labs: 08/01/2019: BUN 13; Creatinine, Ser 1.01; Hemoglobin 16.5; Platelets 198; Potassium 4.2; Sodium 142  Recent Lipid Panel No results found for: CHOL, TRIG, HDL, CHOLHDL, VLDL, LDLCALC, LDLDIRECT  Physical Exam:    VS:  BP 130/90 (BP Location: Right Arm, Patient Position: Sitting, Cuff Size: Normal)   Pulse (!) 53   Ht 5' 11.5" (1.816 m)   Wt 270 lb (122.5 kg)   SpO2 94%   BMI 37.13 kg/m     Wt Readings from Last 3 Encounters:  08/24/19 270 lb (122.5 kg)  07/25/19 264 lb (119.7 kg)     GEN: Well nourished, well developed in no acute distress HEENT: Normal NECK: No JVD; No carotid bruits LYMPHATICS: No lymphadenopathy CARDIAC: S1S2 noted,RRR, no murmurs,  rubs, gallops RESPIRATORY: Mild bibasilar crackles, wheezing Knight rhonchi  ABDOMEN: Soft, non-tender, non-distended, +bowel sounds, no guarding. EXTREMITIES: Bilateral leg edema, No cyanosis, no clubbing MUSCULOSKELETAL:  No deformity  SKIN: Warm and dry NEUROLOGIC:  Alert and oriented x 3, non-focal PSYCHIATRIC:  Normal affect, good insight  ASSESSMENT:    1. Shortness of breath   2. Longstanding persistent atrial fibrillation ([place])   3. Essential hypertension   4. Mixed hyperlipidemia   5. Bilateral leg edema   6. Obesity (BMI 30-39.9)    PLAN:    He is experiencing his shortness of breath which is new.  His physical exam is suggestive of some volume overload with mild bibasilar crackles and bilateral leg edema.  At this time I will start patient on Lasix 40 mg daily and potassium chloride 20 mEq daily.  I also think that his atrial fibrillation may be contributing to his symptoms.  We will monitor the patient while he takes his Lasix for improvement.  In  addition I am hoping to get the patient cardioverted within the next 2 weeks.  I plan to start the patient on antiarrhythmic with amiodarone post cardioversion.  I advised the patient to continue on his anticoagulation with Eliquis.  If his shortness of breath does not improve with his diuretics hopefully post cardioversion where he is in sinus rhythm we will pursue an ischemic evaluation at that time.   He does have some diastolic hypertension in the office today.  But I am starting the patient on Lasix 40 mg daily which I think will also help with his high blood pressure.  He has gained some weight since his last visit which I think is secondary to his retention of fluid.  Blood work will be done today for BMP and mag.  The patient is in agreement with the above plan. The patient left the office in stable condition.  The patient will follow up in 1 month Knight sooner if needed    Medication Adjustments/Labs and Tests Ordered: Current medicines are reviewed at length with the patient today.  Concerns regarding medicines are outlined above.  Orders Placed This Encounter  Procedures  . Basic Metabolic Panel (BMET)  . Magnesium  . EKG 12-Lead   Meds ordered this encounter  Medications  . furosemide (LASIX) 40 MG tablet    Sig: Take 1 tablet (40 mg total) by mouth daily.    Dispense:  90 tablet    Refill:  1  . potassium chloride SA (KLOR-CON M20) 20 MEQ tablet    Sig: Take 1 tablet (20 mEq total) by mouth daily.    Dispense:  90 tablet    Refill:  1    Patient Instructions  Medication Instructions:  Your physician has recommended you make the following change in your medication:   START: Furosemide 40 mg Take 1 tab daily START: Potassium 20 meq Take 1 tab daily  *If you need a refill on your cardiac medications before your next appointment, please call your pharmacy*   Lab Work: Your physician recommends that you return for lab work in: TODAY BMP,Magnesium  If you have labs  (blood work) drawn today and your tests are completely normal, you will receive your results only by: Marland Kitchen MyChart Message (if you have MyChart) Knight . A paper copy in the mail If you have any lab test that is abnormal Knight we need to change your treatment, we will call you to review the results.   Testing/Procedures: Your physician  has recommended that you have a Cardioversion (DCCV). Electrical Cardioversion uses a jolt of electricity to your heart either through paddles Knight wired patches attached to your chest. This is a controlled, usually prescheduled, procedure. Defibrillation is done under light anesthesia in the hospital, and you usually go home the day of the procedure. This is done to get your heart back into a normal rhythm. You are not awake for the procedure. This will be done at  [place]: At [facility], you and your health needs are our priority.  As part of our continuing mission to provide you with exceptional heart care, we have created designated Provider Care Teams.  These Care Teams include your primary Cardiologist (physician) and Advanced Practice Providers (APPs -  Physician Assistants and Nurse Practitioners) who all work together to provide you with the care you need, when you need it.  We recommend signing up for the patient portal called "MyChart".  Sign up information is provided on this After Visit Summary.  MyChart is used to connect with patients for Virtual Visits (Telemedicine).  Patients are able to view lab/test results, encounter notes, upcoming appointments, etc.  Non-urgent messages can be sent to your provider as well.   To learn more about what you can do with MyChart, go to [email or web address].    Your next appointment:   1 month(s)  The format for your next appointment:   In Person  Provider:   Berniece Salines, DO   Other Instructions      Adopting a Healthy Lifestyle.  Know what a healthy weight is for you (roughly BMI  <25) and aim to maintain this   Aim for 7+ servings of fruits and vegetables daily   65-80+ fluid ounces of water Knight unsweet tea for healthy kidneys   Limit to max 1 drink of alcohol per day; avoid smoking/tobacco   Limit animal fats in diet for cholesterol and heart health - choose grass fed whenever available   Avoid highly processed foods, and foods high in saturated/trans fats   Aim for low stress - take time to unwind and care for your mental health   Aim for 150 min of moderate intensity exercise weekly for heart health, and weights twice weekly for bone health   Aim for 7-9 hours of sleep daily   When it comes to diets, agreement about the perfect plan isnt easy to find, even among the experts. Experts at the [employer] developed an idea known as the Healthy Eating Plate. Just imagine a plate divided into logical, healthy portions.   The emphasis is on diet quality:   Load up on vegetables and fruits - one-half of your plate: Aim for color and variety, and remember that potatoes dont count.   Go for whole grains - one-quarter of your plate: Whole wheat, barley, wheat berries, quinoa, oats, brown rice, and foods made with them. If you want pasta, go with whole wheat pasta.   Protein power - one-quarter of your plate: Fish, chicken, beans, and nuts are all healthy, versatile protein sources. Limit red meat.   The diet, however, does go beyond the plate, offering a few other suggestions.   Use healthy plant oils, such as olive, canola, soy, corn, sunflower and peanut. Check the labels, and avoid partially hydrogenated oil, which have unhealthy trans fats.   If youre thirsty, drink water. Coffee and tea are good in moderation, but skip sugary drinks and limit milk  and dairy products to one Knight two daily servings.   The type of carbohydrate in the diet is more important than the amount. Some sources of carbohydrates, such as vegetables, fruits, whole  grains, and beans-are healthier than others.   Finally, stay active  Signed, Thomas Salines, DO  08/24/2019 11:26 AM    [place]

## 2019-08-24 NOTE — Patient Instructions (Signed)
Medication Instructions:  Your physician has recommended you make the following change in your medication:   START: Furosemide 40 mg Take 1 tab daily START: Potassium 20 meq Take 1 tab daily  *If you need a refill on your cardiac medications before your next appointment, please call your pharmacy*   Lab Work: Your physician recommends that you return for lab work in: TODAY BMP,Magnesium  If you have labs (blood work) drawn today and your tests are completely normal, you will receive your results only by: Marland Kitchen MyChart Message (if you have MyChart) OR . A paper copy in the mail If you have any lab test that is abnormal or we need to change your treatment, we will call you to review the results.   Testing/Procedures: Your physician has recommended that you have a Cardioversion (DCCV). Electrical Cardioversion uses a jolt of electricity to your heart either through paddles or wired patches attached to your chest. This is a controlled, usually prescheduled, procedure. Defibrillation is done under light anesthesia in the hospital, and you usually go home the day of the procedure. This is done to get your heart back into a normal rhythm. You are not awake for the procedure. This will be done at  [place]: At [facility], you and your health needs are our priority.  As part of our continuing mission to provide you with exceptional heart care, we have created designated Provider Care Teams.  These Care Teams include your primary Cardiologist (physician) and Advanced Practice Providers (APPs -  Physician Assistants and Nurse Practitioners) who all work together to provide you with the care you need, when you need it.  We recommend signing up for the patient portal called "MyChart".  Sign up information is provided on this After Visit Summary.  MyChart is used to connect with patients for Virtual Visits (Telemedicine).  Patients are able to view lab/test results, encounter  notes, upcoming appointments, etc.  Non-urgent messages can be sent to your provider as well.   To learn more about what you can do with MyChart, go to [email or web address].    Your next appointment:   1 month(s)  The format for your next appointment:   In Person  Provider:   Berniece Salines, DO   Other Instructions

## 2019-08-27 NOTE — Telephone Encounter (Signed)
Called [facility] where he was tested. They are faxing results to me today and I will fax to [facility].Spoke with patient and he is scheduled for Thurs 08/30/19 At 9:30 To arrive at hospital at 7:30.Patient verbalized understanding.

## 2019-08-27 NOTE — Telephone Encounter (Signed)
Mickel Baas could you please take care of this for me thank you.

## 2019-08-30 DIAGNOSIS — I4891 Unspecified atrial fibrillation: Secondary | ICD-10-CM | POA: Diagnosis not present

## 2019-08-30 DIAGNOSIS — E669 Obesity, unspecified: Secondary | ICD-10-CM | POA: Diagnosis not present

## 2019-08-30 DIAGNOSIS — I10 Essential (primary) hypertension: Secondary | ICD-10-CM | POA: Diagnosis not present

## 2019-08-30 DIAGNOSIS — J309 Allergic rhinitis, unspecified: Secondary | ICD-10-CM | POA: Diagnosis not present

## 2019-09-03 ENCOUNTER — Telehealth: Payer: Self-pay | Admitting: Cardiology

## 2019-09-03 NOTE — Telephone Encounter (Signed)
  Patient has the opportunity to get the covid vaccine today @ 1:15 pm but he had a cardioversion done recently and he wants to know if it is okay for him to get the shot. Please let him know soon as possible so he can go to his appt.  Patient also wants to know what would be the sign or symptoms of afib so he would know what to look for in case he were to go back into afib since the cardioversion.

## 2019-09-03 NOTE — Telephone Encounter (Signed)
Telephone call to patient. Informed no reason he should not get the COVID vaccine today. Went over s/s of atrial fib and that there are times he may not know he is in atrial fib. Pt satisfied

## 2019-09-07 ENCOUNTER — Other Ambulatory Visit: Payer: Self-pay | Admitting: *Deleted

## 2019-09-07 ENCOUNTER — Telehealth: Payer: Self-pay | Admitting: Cardiology

## 2019-09-07 MED ORDER — ELIQUIS 5 MG PO TABS: 5.0000 mg | ORAL_TABLET | Freq: Two times a day (BID) | ORAL | 5 refills | Status: DC

## 2019-09-07 MED ORDER — METOPROLOL TARTRATE 25 MG PO TABS: 25.0000 mg | ORAL_TABLET | Freq: Two times a day (BID) | ORAL | 1 refills | Status: DC

## 2019-09-07 NOTE — Telephone Encounter (Signed)
Refill sent.

## 2019-09-07 NOTE — Telephone Encounter (Signed)
  *  STAT* If patient is at the pharmacy, call can be transferred to refill team.   1. Which medications need to be refilled? (please list name of each medication and dose if known) ELIQUIS 5 MG TABS tablet, metoprolol tartrate (LOPRESSOR) 25 MG tablet  2. Which pharmacy/location (including street and city if local pharmacy) is medication to be sent to? [place], [place]  3. Do they need a 30 day or 90 day supply? 90 days

## 2019-09-10 ENCOUNTER — Other Ambulatory Visit: Payer: Self-pay

## 2019-09-10 MED ORDER — METOPROLOL TARTRATE 25 MG PO TABS: 25.0000 mg | ORAL_TABLET | Freq: Two times a day (BID) | ORAL | 2 refills | Status: DC

## 2019-09-10 MED ORDER — ELIQUIS 5 MG PO TABS: 5.0000 mg | ORAL_TABLET | Freq: Two times a day (BID) | ORAL | 2 refills | Status: DC

## 2019-09-21 ENCOUNTER — Encounter: Payer: Self-pay | Admitting: Cardiology

## 2019-09-21 ENCOUNTER — Ambulatory Visit (INDEPENDENT_AMBULATORY_CARE_PROVIDER_SITE_OTHER): Payer: Medicare HMO | Admitting: Cardiology

## 2019-09-21 ENCOUNTER — Other Ambulatory Visit: Payer: Self-pay

## 2019-09-21 VITALS — BP 150/80 | HR 63 | Ht 71.5 in | Wt 263.0 lb

## 2019-09-21 DIAGNOSIS — E669 Obesity, unspecified: Secondary | ICD-10-CM

## 2019-09-21 DIAGNOSIS — I480 Paroxysmal atrial fibrillation: Secondary | ICD-10-CM

## 2019-09-21 DIAGNOSIS — I10 Essential (primary) hypertension: Secondary | ICD-10-CM | POA: Diagnosis not present

## 2019-09-21 NOTE — Progress Notes (Signed)
Cardiology Office Note:    Date:  09/21/2019   ID:  Thomas Knight, DOB Apr 15, 1946, MRN [id number]  PCP:  Angelina Sheriff, MD  Cardiologist:  Berniece Salines, DO  Electrophysiologist:  None   Referring MD: Angelina Sheriff, MD    Follow up visit   History of Present Illness:    Thomas Knight is a 74 y.o. male with a hx of history of atrial fibrillation on Eliquis 5 mg twice a day and metoprolol tartrate 25 mg twice daily, hypertension, recent COVID-19 infection.  The patient is status post cardioversion from the [facility] with no complications.  He is here today for follow-up visit.  He offers no complaints at this time.  Past Medical History:  Diagnosis Date  . Chronic cough   . Chronic pain of left knee 12/01/2015    Past Surgical History:  Procedure Laterality Date  . ACHILLES TENDON REPAIR Left 2012   Ruptured  . HERNIA REPAIR     umbilical hernia  . SKIN SURGERY Left    Basal cell carcinoma on left forearm    Current Medications: Current Meds  Medication Sig  . Ascorbic Acid (VITAMIN C WITH ROSE HIPS) 500 MG tablet Take 500 mg by mouth daily.  Marland Kitchen aspirin 81 MG EC tablet Take 81 mg by mouth daily.  . Cholecalciferol (D3-1000) 25 MCG (1000 UT) capsule Take 1,000 Units by mouth daily.  Marland Kitchen ELIQUIS 5 MG TABS tablet Take 1 tablet (5 mg total) by mouth 2 (two) times daily.  . furosemide (LASIX) 40 MG tablet Take 1 tablet (40 mg total) by mouth daily.  . metoprolol tartrate (LOPRESSOR) 25 MG tablet Take 1 tablet (25 mg total) by mouth 2 (two) times daily.  Marland Kitchen OVER THE COUNTER MEDICATION daily. Instaflex featuring UC-II Collagen/ Relieves discomfort, improves flexibility, joint relief in 7 days  . potassium chloride SA (KLOR-CON M20) 20 MEQ tablet Take 1 tablet (20 mEq total) by mouth daily.  . Zinc 50 MG CAPS Take by mouth.  . [DISCONTINUED] chlorpheniramine-HYDROcodone (TUSSIONEX) 10-8 MG/5ML SUER Take 5 mLs by mouth 2 (two) times daily as needed.  .  [DISCONTINUED] dextromethorphan-guaiFENesin (ROBITUSSIN-DM) 10-100 MG/5ML liquid Take 10 mLs by mouth every 4 (four) hours as needed for cough.     Allergies:   Patient has no known allergies.   Social History   Socioeconomic History  . Marital status: Married    Spouse name: Not on file  . Number of children: Not on file  . Years of education: Not on file  . Highest education level: Not on file  Occupational History  . Not on file  Tobacco Use  . Smoking status: Never Smoker  . Smokeless tobacco: Never Used  Substance and Sexual Activity  . Alcohol use: Never  . Drug use: Never  . Sexual activity: Not on file  Other Topics Concern  . Not on file  Social History Narrative  . Not on file   Social Determinants of Health   Financial Resource Strain:   . Difficulty of Paying Living Expenses:   Food Insecurity:   . Worried About Charity fundraiser in the Last Year:   . Arboriculturist in the Last Year:   Transportation Needs:   . Film/video editor (Medical):   Marland Kitchen Lack of Transportation (Non-Medical):   Physical Activity:   . Days of Exercise per Week:   . Minutes of Exercise per Session:   Stress:   .  Feeling of Stress :   Social Connections:   . Frequency of Communication with Friends and Family:   . Frequency of Social Gatherings with Friends and Family:   . Attends Religious Services:   . Active Member of Clubs [name]:   . Attends Archivist Meetings:   Marland Kitchen Marital Status:      Family History: The patient's family history includes Lung cancer in his father.  ROS:   Review of Systems  Constitution: Negative for decreased appetite, fever and weight gain.  HENT: Negative for congestion, ear discharge, hoarse voice and sore throat.   Eyes: Negative for discharge, redness, vision loss in right eye and visual halos.  Cardiovascular: Negative for chest pain, dyspnea on exertion, leg swelling, orthopnea and palpitations.  Respiratory: Negative  for cough, hemoptysis, shortness of breath and snoring.   Endocrine: Negative for heat intolerance and polyphagia.  Hematologic/Lymphatic: Negative for bleeding problem. Does not bruise/bleed easily.  Skin: Negative for flushing, nail changes, rash and suspicious lesions.  Musculoskeletal: Negative for arthritis, joint pain, muscle cramps, myalgias, neck pain and stiffness.  Gastrointestinal: Negative for abdominal pain, bowel incontinence, diarrhea and excessive appetite.  Genitourinary: Negative for decreased libido, genital sores and incomplete emptying.  Neurological: Negative for brief paralysis, focal weakness, headaches and loss of balance.  Psychiatric/Behavioral: Negative for altered mental status, depression and suicidal ideas.  Allergic/Immunologic: Negative for HIV exposure and persistent infections.    EKGs/Labs/Other Studies Reviewed:    The following studies were reviewed today:   EKG:  The ekg ordered today demonstrates sinus rhythm, heart rate 61 bpm with first-degree AV block.  Prior EKG showed evidence of A. fib with controlled ventricular rate.  Recent Labs: 08/01/2019: Hemoglobin 16.5; Platelets 198 08/24/2019: BUN 13; Creatinine, Ser 1.06; Magnesium 2.3; Potassium 4.5; Sodium 140  Recent Lipid Panel No results found for: CHOL, TRIG, HDL, CHOLHDL, VLDL, LDLCALC, LDLDIRECT  Physical Exam:    VS:  BP (!) 150/80 (BP Location: Right Arm, Patient Position: Sitting, Cuff Size: Large)   Pulse 63   Ht 5' 11.5" (1.816 m)   Wt 263 lb (119.3 kg)   SpO2 94%   BMI 36.17 kg/m     Wt Readings from Last 3 Encounters:  09/21/19 263 lb (119.3 kg)  08/24/19 270 lb (122.5 kg)  07/25/19 264 lb (119.7 kg)     GEN: Well nourished, well developed in no acute distress HEENT: Normal NECK: No JVD; No carotid bruits LYMPHATICS: No lymphadenopathy CARDIAC: S1S2 noted,RRR, no murmurs, rubs, gallops RESPIRATORY:  Clear to auscultation without rales, wheezing Knight rhonchi  ABDOMEN:  Soft, non-tender, non-distended, +bowel sounds, no guarding. EXTREMITIES: bilateral +1 leg edema, No cyanosis, no clubbing MUSCULOSKELETAL:  No deformity  SKIN: Warm and dry NEUROLOGIC:  Alert and oriented x 3, non-focal PSYCHIATRIC:  Normal affect, good insight  ASSESSMENT:    1. Paroxysmal atrial fibrillation (HCC)   2. Essential hypertension   3. Obesity (BMI 30-39.9)    PLAN:    He is in sinus rhythm today.  I discussed with patient about possible antiarrhythmics he prefers to hold off on this for now.  He will continue on his metoprolol tartrate 25 mg twice daily and his Eliquis 5 mg twice daily.  He does have some bilateral edema at this time going to have him increase his Lasix to 40 mg twice a day on Saturday and Monday and go back to his regular 40 mg daily.  He is hypertensive in the office today, hoping that  increasing his Lasix and his decreasing salt intake will help bring his blood pressure some improvement.  He has been asked to take his blood pressure daily we will check back with the patient in 1 week to see if he is getting readings started acceptable less than 130/80.  At which time further recommendations for additional medication will be made.  The patient is in agreement with the above plan. The patient left the office in stable condition.  The patient will follow up in 3 months.    Medication Adjustments/Labs and Tests Ordered: Current medicines are reviewed at length with the patient today.  Concerns regarding medicines are outlined above.  Orders Placed This Encounter  Procedures  . EKG 12-Lead   No orders of the defined types were placed in this encounter.   Patient Instructions  Medication Instructions:  1) Increase Lasix to 40 mg twice a day on Monday and Saturday then back to once a daily   2) Increase Potassium to 20 mg twice a day on Monday on Saturday when you take your Lasix then back to once daily  *If you need a refill on your cardiac  medications before your next appointment, please call your pharmacy*   Lab Work: None ordered   If you have labs (blood work) drawn today and your tests are completely normal, you will receive your results only by: Marland Kitchen MyChart Message (if you have MyChart) Knight . A paper copy in the mail If you have any lab test that is abnormal Knight we need to change your treatment, we will call you to review the results.   Testing/Procedures: None ordered    Follow-Up: At [facility], you and your health needs are our priority.  As part of our continuing mission to provide you with exceptional heart care, we have created designated Provider Care Teams.  These Care Teams include your primary Cardiologist (physician) and Advanced Practice Providers (APPs -  Physician Assistants and Nurse Practitioners) who all work together to provide you with the care you need, when you need it.  We recommend signing up for the patient portal called "MyChart".  Sign up information is provided on this After Visit Summary.  MyChart is used to connect with patients for Virtual Visits (Telemedicine).  Patients are able to view lab/test results, encounter notes, upcoming appointments, etc.  Non-urgent messages can be sent to your provider as well.   To learn more about what you can do with MyChart, go to [email or web address].    Your next appointment:   3 month(s)  The format for your next appointment:   In Person  Provider:   Berniece Salines, DO   Other Instructions None      Adopting a Healthy Lifestyle.  Know what a healthy weight is for you (roughly BMI <25) and aim to maintain this   Aim for 7+ servings of fruits and vegetables daily   65-80+ fluid ounces of water Knight unsweet tea for healthy kidneys   Limit to max 1 drink of alcohol per day; avoid smoking/tobacco   Limit animal fats in diet for cholesterol and heart health - choose grass fed whenever available   Avoid highly processed foods, and foods  high in saturated/trans fats   Aim for low stress - take time to unwind and care for your mental health   Aim for 150 min of moderate intensity exercise weekly for heart health, and weights twice weekly for bone health   Aim for 7-9 hours of sleep  daily   When it comes to diets, agreement about the perfect plan isnt easy to find, even among the experts. Experts at the [employer] developed an idea known as the Healthy Eating Plate. Just imagine a plate divided into logical, healthy portions.   The emphasis is on diet quality:   Load up on vegetables and fruits - one-half of your plate: Aim for color and variety, and remember that potatoes dont count.   Go for whole grains - one-quarter of your plate: Whole wheat, barley, wheat berries, quinoa, oats, brown rice, and foods made with them. If you want pasta, go with whole wheat pasta.   Protein power - one-quarter of your plate: Fish, chicken, beans, and nuts are all healthy, versatile protein sources. Limit red meat.   The diet, however, does go beyond the plate, offering a few other suggestions.   Use healthy plant oils, such as olive, canola, soy, corn, sunflower and peanut. Check the labels, and avoid partially hydrogenated oil, which have unhealthy trans fats.   If youre thirsty, drink water. Coffee and tea are good in moderation, but skip sugary drinks and limit milk and dairy products to one Knight two daily servings.   The type of carbohydrate in the diet is more important than the amount. Some sources of carbohydrates, such as vegetables, fruits, whole grains, and beans-are healthier than others.   Finally, stay active  Signed, Berniece Salines, DO  09/21/2019 11:04 AM    Thomas Knight

## 2019-09-21 NOTE — Patient Instructions (Addendum)
Medication Instructions:  1) Increase Lasix to 40 mg twice a day on Monday and Saturday then back to once a daily   2) Increase Potassium to 20 mg twice a day on Monday on Saturday when you take your Lasix then back to once daily  *If you need a refill on your cardiac medications before your next appointment, please call your pharmacy*   Lab Work: None ordered   If you have labs (blood work) drawn today and your tests are completely normal, you will receive your results only by: Marland Kitchen MyChart Message (if you have MyChart) OR . A paper copy in the mail If you have any lab test that is abnormal or we need to change your treatment, we will call you to review the results.   Testing/Procedures: None ordered    Follow-Up: At [facility], you and your health needs are our priority.  As part of our continuing mission to provide you with exceptional heart care, we have created designated Provider Care Teams.  These Care Teams include your primary Cardiologist (physician) and Advanced Practice Providers (APPs -  Physician Assistants and Nurse Practitioners) who all work together to provide you with the care you need, when you need it.  We recommend signing up for the patient portal called "MyChart".  Sign up information is provided on this After Visit Summary.  MyChart is used to connect with patients for Virtual Visits (Telemedicine).  Patients are able to view lab/test results, encounter notes, upcoming appointments, etc.  Non-urgent messages can be sent to your provider as well.   To learn more about what you can do with MyChart, go to [email or web address].    Your next appointment:   3 month(s)  The format for your next appointment:   In Person  Provider:   Berniece Salines, DO   Other Instructions None

## 2019-10-08 ENCOUNTER — Other Ambulatory Visit: Payer: Self-pay | Admitting: Cardiology

## 2019-10-08 MED ORDER — FUROSEMIDE 40 MG PO TABS: 40.0000 mg | ORAL_TABLET | Freq: Every day | ORAL | 1 refills | Status: DC

## 2019-10-08 NOTE — Telephone Encounter (Signed)
Medication refilled

## 2019-10-08 NOTE — Telephone Encounter (Signed)
Yes please refill 

## 2019-10-08 NOTE — Telephone Encounter (Signed)
Patient dropped most of his 90 day supply and the pharmacy will not let him get his refill yet because it has not been enough time from the last one. He is running out of medication. Will verify that Dr. Harriet Masson is fine for [place] to go ahead and fill again.

## 2019-10-08 NOTE — Telephone Encounter (Signed)
*  STAT* If patient is at the pharmacy, call can be transferred to refill team.   1. Which medications need to be refilled? (please list name of each medication and dose if known) furosemide (LASIX) 40 MG tablet  2. Which pharmacy/location (including street and city if local pharmacy) is medication to be sent to? [place], [place]  3. Do they need a 30 day or 90 day supply? 90 day  Patient is out of medication. He states he dropped the rest.

## 2019-12-13 DIAGNOSIS — R69 Illness, unspecified: Secondary | ICD-10-CM | POA: Diagnosis not present

## 2019-12-17 DIAGNOSIS — R69 Illness, unspecified: Secondary | ICD-10-CM | POA: Diagnosis not present

## 2019-12-28 ENCOUNTER — Ambulatory Visit: Payer: Medicare HMO | Admitting: Cardiology

## 2020-01-10 ENCOUNTER — Ambulatory Visit (INDEPENDENT_AMBULATORY_CARE_PROVIDER_SITE_OTHER): Payer: Medicare HMO | Admitting: Cardiology

## 2020-01-10 ENCOUNTER — Encounter: Payer: Self-pay | Admitting: Cardiology

## 2020-01-10 ENCOUNTER — Other Ambulatory Visit: Payer: Self-pay

## 2020-01-10 VITALS — BP 144/88 | HR 57 | Ht 71.5 in | Wt 261.6 lb

## 2020-01-10 DIAGNOSIS — E669 Obesity, unspecified: Secondary | ICD-10-CM | POA: Diagnosis not present

## 2020-01-10 DIAGNOSIS — E782 Mixed hyperlipidemia: Secondary | ICD-10-CM | POA: Diagnosis not present

## 2020-01-10 DIAGNOSIS — R072 Precordial pain: Secondary | ICD-10-CM

## 2020-01-10 DIAGNOSIS — R600 Localized edema: Secondary | ICD-10-CM | POA: Diagnosis not present

## 2020-01-10 DIAGNOSIS — I480 Paroxysmal atrial fibrillation: Secondary | ICD-10-CM

## 2020-01-10 DIAGNOSIS — I10 Essential (primary) hypertension: Secondary | ICD-10-CM

## 2020-01-10 MED ORDER — NITROGLYCERIN 0.4 MG SL SUBL
0.4000 mg | SUBLINGUAL_TABLET | SUBLINGUAL | 6 refills | Status: DC | PRN
Start: 2020-01-10 — End: 2021-01-30

## 2020-01-10 NOTE — Progress Notes (Signed)
Cardiology Office Note:    Date:  01/10/2020   ID:  [name], DOB 1946/05/23, MRN [id number]  PCP:  Angelina Sheriff, MD  Cardiologist:  Berniece Salines, DO  Electrophysiologist:  None   Referring MD: Angelina Sheriff, MD   " I have been having some chest pain"  History of Present Illness:    Thomas Knight is a 74 y.o. male with a hx of  history of atrial fibrillation on Eliquis 5 mg twice a day and metoprolol tartrate 25 mg twice daily status post cardioversion, hypertension, recent COVID-19 infection.  Last saw the patient on March 3 12/16/2019 at that time he appeared to be doing well he has some bilateral leg edema I increase his Lasix to 40 mg twice daily for 2 days and have him back on his regular 40 mg daily.  In the interim he tells me he has done well with his leg edema.  New symptom today is the fact that the patient tells me that on exertion he is experiencing chest tightness and burning sensation.  He notes that his mailbox is a little further and on the incline from his house whenever he walks to his mailbox and he comes back he feels this tightening burning sensation across his midsternal area.  He notes that on rest this completely resolves.  Nothing makes it better or worse.  Past Medical History:  Diagnosis Date  . Chronic cough   . Chronic pain of left knee 12/01/2015    Past Surgical History:  Procedure Laterality Date  . ACHILLES TENDON REPAIR Left 2012   Ruptured  . HERNIA REPAIR     umbilical hernia  . SKIN SURGERY Left    Basal cell carcinoma on left forearm    Current Medications: Current Meds  Medication Sig  . Ascorbic Acid (VITAMIN C WITH ROSE HIPS) 500 MG tablet Take 500 mg by mouth daily.  Marland Kitchen aspirin 81 MG EC tablet Take 81 mg by mouth daily.  . Cholecalciferol (D3-1000) 25 MCG (1000 UT) capsule Take 1,000 Units by mouth daily.  Marland Kitchen ELIQUIS 5 MG TABS tablet Take 1 tablet (5 mg total) by mouth 2 (two) times daily.  . furosemide (LASIX) 40 MG  tablet Take 1 tablet (40 mg total) by mouth daily.  . metoprolol tartrate (LOPRESSOR) 25 MG tablet Take 1 tablet (25 mg total) by mouth 2 (two) times daily.  Marland Kitchen OVER THE COUNTER MEDICATION daily. Instaflex featuring UC-II Collagen/ Relieves discomfort, improves flexibility, joint relief in 7 days  . potassium chloride SA (KLOR-CON M20) 20 MEQ tablet Take 1 tablet (20 mEq total) by mouth daily.  . Zinc 50 MG CAPS Take by mouth.     Allergies:   Patient has no known allergies.   Social History   Socioeconomic History  . Marital status: Married    Spouse name: Not on file  . Number of children: Not on file  . Years of education: Not on file  . Highest education level: Not on file  Occupational History  . Not on file  Tobacco Use  . Smoking status: Never Smoker  . Smokeless tobacco: Never Used  Substance and Sexual Activity  . Alcohol use: Never  . Drug use: Never  . Sexual activity: Not on file  Other Topics Concern  . Not on file  Social History Narrative  . Not on file   Social Determinants of Health   Financial Resource Strain:   . Difficulty of Paying Living  Expenses:   Food Insecurity:   . Worried About Charity fundraiser in the Last Year:   . Arboriculturist in the Last Year:   Transportation Needs:   . Film/video editor (Medical):   Marland Kitchen Lack of Transportation (Non-Medical):   Physical Activity:   . Days of Exercise per Week:   . Minutes of Exercise per Session:   Stress:   . Feeling of Stress :   Social Connections:   . Frequency of Communication with Friends and Family:   . Frequency of Social Gatherings with Friends and Family:   . Attends Religious Services:   . Active Member of Clubs or Organizations:   . Attends Archivist Meetings:   Marland Kitchen Marital Status:      Family History: The patient's family history includes Lung cancer in his father.  ROS:   Review of Systems  Constitution: Negative for decreased appetite, fever and weight gain.    HENT: Negative for congestion, ear discharge, hoarse voice and sore throat.   Eyes: Negative for discharge, redness, vision loss in right eye and visual halos.  Cardiovascular: Negative for chest pain, dyspnea on exertion, leg swelling, orthopnea and palpitations.  Respiratory: Negative for cough, hemoptysis, shortness of breath and snoring.   Endocrine: Negative for heat intolerance and polyphagia.  Hematologic/Lymphatic: Negative for bleeding problem. Does not bruise/bleed easily.  Skin: Negative for flushing, nail changes, rash and suspicious lesions.  Musculoskeletal: Negative for arthritis, joint pain, muscle cramps, myalgias, neck pain and stiffness.  Gastrointestinal: Negative for abdominal pain, bowel incontinence, diarrhea and excessive appetite.  Genitourinary: Negative for decreased libido, genital sores and incomplete emptying.  Neurological: Negative for brief paralysis, focal weakness, headaches and loss of balance.  Psychiatric/Behavioral: Negative for altered mental status, depression and suicidal ideas.  Allergic/Immunologic: Negative for HIV exposure and persistent infections.    EKGs/Labs/Other Studies Reviewed:    The following studies were reviewed today:   EKG:  The ekg ordered today demonstrates sinus bradycardia, heart rate 52 bpm with first-degree AV block.  Compared to prior EKG no significant change.  Recent Labs: 08/01/2019: Hemoglobin 16.5; Platelets 198 08/24/2019: BUN 13; Creatinine, Ser 1.06; Magnesium 2.3; Potassium 4.5; Sodium 140  Recent Lipid Panel No results found for: CHOL, TRIG, HDL, CHOLHDL, VLDL, LDLCALC, LDLDIRECT  Physical Exam:    VS:  BP (!) 144/88 (BP Location: Right Arm, Patient Position: Sitting, Cuff Size: Normal)   Pulse (!) 57   Ht 5' 11.5" (1.816 m)   Wt 261 lb 9.6 oz (118.7 kg)   SpO2 95%   BMI 35.98 kg/m     Wt Readings from Last 3 Encounters:  01/10/20 261 lb 9.6 oz (118.7 kg)  09/21/19 263 lb (119.3 kg)  08/24/19 270 lb  (122.5 kg)     GEN: Well nourished, well developed in no acute distress HEENT: Normal NECK: No JVD; No carotid bruits LYMPHATICS: No lymphadenopathy CARDIAC: S1S2 noted,RRR, no murmurs, rubs, gallops RESPIRATORY:  Clear to auscultation without rales, wheezing or rhonchi  ABDOMEN: Soft, non-tender, non-distended, +bowel sounds, no guarding. EXTREMITIES: No edema, No cyanosis, no clubbing MUSCULOSKELETAL:  No deformity  SKIN: Warm and dry NEUROLOGIC:  Alert and oriented x 3, non-focal PSYCHIATRIC:  Normal affect, good insight  ASSESSMENT:    1. Precordial pain   2. Paroxysmal atrial fibrillation (HCC)   3. Essential hypertension   4. Mixed hyperlipidemia   5. Bilateral leg edema   6. Obesity (BMI 30-39.9)    PLAN:  His chest tightness/burning sensation is concerning given the patient risk factors I therefore would like to proceed with coronary CTA in this patient.  He does not have any IV contrast dye allergies.  He is agreeable to proceed with this testing.  Sublingual nitroglycerin prescription was sent, its protocol and 911 protocol explained and the patient vocalized understanding questions were answered to the patient's satisfaction.  Paroxysmal atrial fibrillation-patient is still in sinus rhythm today.  EKG shows sinus bradycardia no changes will be made to his medication.  We have previously discussed antiarrhythmics and the patient does not really want to start this on occasion option for now.  Obesity-the patient understands the need to lose weight with diet and exercise. We have discussed specific strategies for this.  Hypertension-his blood pressure is improved but not yet at target therefore would like the patient to continue to decrease his salt intake.  We discussed that if his diet modification does not help by his next visit if he is still above target of greater than 130/80 mmHg additional antihypertensive medication will be added.   The patient is in  agreement with the above plan. The patient left the office in stable condition.  The patient will follow up in 3 months or sooner if needed.   Medication Adjustments/Labs and Tests Ordered: Current medicines are reviewed at length with the patient today.  Concerns regarding medicines are outlined above.  Orders Placed This Encounter  Procedures  . CT CORONARY MORPH W/CTA COR W/SCORE W/CA W/CM &/OR WO/CM  . CT CORONARY FRACTIONAL FLOW RESERVE DATA PREP  . CT CORONARY FRACTIONAL FLOW RESERVE FLUID ANALYSIS  . Basic metabolic panel  . EKG 12-Lead   Meds ordered this encounter  Medications  . nitroGLYCERIN (NITROSTAT) 0.4 MG SL tablet    Sig: Place 1 tablet (0.4 mg total) under the tongue every 5 (five) minutes as needed.    Dispense:  25 tablet    Refill:  6    Patient Instructions  Medication Instructions:  Your physician has recommended you make the following change in your medication:   Take Nitroglycerin as needed for chest pain.  *If you need a refill on your cardiac medications before your next appointment, please call your pharmacy*   Lab Work: Your physician recommends that you return for lab work in: 1 week prior to your CT. You can come Monday through Friday 8:30 am to 12:00 pm and 1:15 to 4:30. You do not need to make an appointment as the order has already been placed.   If you have labs (blood work) drawn today and your tests are completely normal, you will receive your results only by: Marland Kitchen MyChart Message (if you have MyChart) OR . A paper copy in the mail If you have any lab test that is abnormal or we need to change your treatment, we will call you to review the results.   Testing/Procedures: Your cardiac CT will be scheduled at:   [facility] [address] [place], [place] [address] [id number]  If scheduled at [facility], please arrive at the [facility] main entrance of [facility] 30 minutes prior to test start  time. Proceed to the [facility] Radiology Department (first floor) to check-in and test prep.  Please follow these instructions carefully (unless otherwise directed):  Hold all erectile dysfunction medications at least 3 days (72 hrs) prior to test.  On the Night Before the Test: . Be sure to Drink plenty of water. Marland Kitchen  Do not consume any caffeinated/decaffeinated beverages or chocolate 12 hours prior to your test. . Do not take any antihistamines 12 hours prior to your test.   On the Day of the Test: . Drink plenty of water. Do not drink any water within one hour of the test. . Do not eat any food 4 hours prior to the test. . You may take your regular medications prior to the test.  . Take metoprolol (Lopressor) two hours prior to test. . HOLD Furosemide  morning of the test.        After the Test: . Drink plenty of water. . After receiving IV contrast, you may experience a mild flushed feeling. This is normal. . On occasion, you may experience a mild rash up to 24 hours after the test. This is not dangerous. If this occurs, you can take Benadryl 25 mg and increase your fluid intake. . If you experience trouble breathing, this can be serious. If it is severe call 911 IMMEDIATELY. If it is mild, please call our office. . If you take any of these medications: Glipizide/Metformin, Avandament, Glucavance, please do not take 48 hours after completing test unless otherwise instructed.   Once we have confirmed authorization from your insurance company, we will call you to set up a date and time for your test. Based on how quickly your insurance processes prior authorizations requests, please allow up to 4 weeks to be contacted for scheduling your Cardiac CT appointment. Be advised that routine Cardiac CT appointments could be scheduled as many as 8 weeks after your provider has ordered it.  For non-scheduling related questions, please contact the cardiac imaging nurse navigator should you have  any questions/concerns: Marchia Bond, Cardiac Imaging Nurse Navigator Burley Saver, Interim Cardiac Imaging Nurse [facility] Direct Office Dial: [phone]   For scheduling needs, including cancellations and rescheduling, please call Vivien Rota at [phone], option 3.      Follow-Up: At [facility], you and your health needs are our priority.  As part of our continuing mission to provide you with exceptional heart care, we have created designated Provider Care Teams.  These Care Teams include your primary Cardiologist (physician) and Advanced Practice Providers (APPs -  Physician Assistants and Nurse Practitioners) who all work together to provide you with the care you need, when you need it.  We recommend signing up for the patient portal called "MyChart".  Sign up information is provided on this After Visit Summary.  MyChart is used to connect with patients for Virtual Visits (Telemedicine).  Patients are able to view lab/test results, encounter notes, upcoming appointments, etc.  Non-urgent messages can be sent to your provider as well.   To learn more about what you can do with MyChart, go to [email or web address].    Your next appointment:   3 month(s)  The format for your next appointment:   In Person  Provider:   Jyl Heinz, MD   Other Instructions  Cardiac CT Angiogram A cardiac CT angiogram is a procedure to look at the heart and the area around the heart. It may be done to help find the cause of chest pains or other symptoms of heart disease. During this procedure, a substance called contrast dye is injected into the blood vessels in the area to be checked. A large X-ray machine, called a CT scanner, then takes detailed pictures of the heart and the surrounding area. The procedure is also sometimes called a coronary CT angiogram,  coronary artery scanning, or CTA. A cardiac CT angiogram allows the health care provider to see how well  blood is flowing to and from the heart. The health care provider will be able to see if there are any problems, such as:  Blockage or narrowing of the coronary arteries in the heart.  Fluid around the heart.  Signs of weakness or disease in the muscles, valves, and tissues of the heart. Tell a health care provider about:  Any allergies you have. This is especially important if you have had a previous allergic reaction to contrast dye.  All medicines you are taking, including vitamins, herbs, eye drops, creams, and over-the-counter medicines.  Any blood disorders you have.  Any surgeries you have had.  Any medical conditions you have.  Whether you are pregnant or may be pregnant.  Any anxiety disorders, chronic pain, or other conditions you have that may increase your stress or prevent you from lying still. What are the risks? Generally, this is a safe procedure. However, problems may occur, including:  Bleeding.  Infection.  Allergic reactions to medicines or dyes.  Damage to other structures or organs.  Kidney damage from the contrast dye that is used.  Increased risk of cancer from radiation exposure. This risk is low. Talk with your health care provider about: ? The risks and benefits of testing. ? How you can receive the lowest dose of radiation. What happens before the procedure?  Wear comfortable clothing and remove any jewelry, glasses, dentures, and hearing aids.  Follow instructions from your health care provider about eating and drinking. This may include: ? For 12 hours before the procedure -- avoid caffeine. This includes tea, coffee, soda, energy drinks, and diet pills. Drink plenty of water or other fluids that do not have caffeine in them. Being well hydrated can prevent complications. ? For 4-6 hours before the procedure -- stop eating and drinking. The contrast dye can cause nausea, but this is less likely if your stomach is empty.  Ask your health care  provider about changing or stopping your regular medicines. This is especially important if you are taking diabetes medicines, blood thinners, or medicines to treat problems with erections (erectile dysfunction). What happens during the procedure?   Hair on your chest may need to be removed so that small sticky patches called electrodes can be placed on your chest. These will transmit information that helps to monitor your heart during the procedure.  An IV will be inserted into one of your veins.  You might be given a medicine to control your heart rate during the procedure. This will help to ensure that good images are obtained.  You will be asked to lie on an exam table. This table will slide in and out of the CT machine during the procedure.  Contrast dye will be injected into the IV. You might feel warm, or you may get a metallic taste in your mouth.  You will be given a medicine called nitroglycerin. This will relax or dilate the arteries in your heart.  The table that you are lying on will move into the CT machine tunnel for the scan.  The person running the machine will give you instructions while the scans are being done. You may be asked to: ? Keep your arms above your head. ? Hold your breath. ? Stay very still, even if the table is moving.  When the scanning is complete, you will be moved out of the machine.  The  IV will be removed. The procedure may vary among health care providers and hospitals. What can I expect after the procedure? After your procedure, it is common to have:  A metallic taste in your mouth from the contrast dye.  A feeling of warmth.  A headache from the nitroglycerin. Follow these instructions at home:  Take over-the-counter and prescription medicines only as told by your health care provider.  If you are told, drink enough fluid to keep your urine pale yellow. This will help to flush the contrast dye out of your body.  Most people can return  to their normal activities right after the procedure. Ask your health care provider what activities are safe for you.  It is up to you to get the results of your procedure. Ask your health care provider, or the department that is doing the procedure, when your results will be ready.  Keep all follow-up visits as told by your health care provider. This is important. Contact a health care provider if:  You have any symptoms of allergy to the contrast dye. These include: ? Shortness of breath. ? Rash or hives. ? A racing heartbeat. Summary  A cardiac CT angiogram is a procedure to look at the heart and the area around the heart. It may be done to help find the cause of chest pains or other symptoms of heart disease.  During this procedure, a large X-ray machine, called a CT scanner, takes detailed pictures of the heart and the surrounding area after a contrast dye has been injected into blood vessels in the area.  Ask your health care provider about changing or stopping your regular medicines before the procedure. This is especially important if you are taking diabetes medicines, blood thinners, or medicines to treat erectile dysfunction.  If you are told, drink enough fluid to keep your urine pale yellow. This will help to flush the contrast dye out of your body. This information is not intended to replace advice given to you by your health care provider. Make sure you discuss any questions you have with your health care provider. Document Revised: 02/07/2019 Document Reviewed: 02/07/2019 Elsevier Patient Education  [place].     Adopting a Healthy Lifestyle.  Know what a healthy weight is for you (roughly BMI <25) and aim to maintain this   Aim for 7+ servings of fruits and vegetables daily   65-80+ fluid ounces of water or unsweet tea for healthy kidneys   Limit to max 1 drink of alcohol per day; avoid smoking/tobacco   Limit animal fats in diet for cholesterol and heart  health - choose grass fed whenever available   Avoid highly processed foods, and foods high in saturated/trans fats   Aim for low stress - take time to unwind and care for your mental health   Aim for 150 min of moderate intensity exercise weekly for heart health, and weights twice weekly for bone health   Aim for 7-9 hours of sleep daily   When it comes to diets, agreement about the perfect plan isnt easy to find, even among the experts. Experts at the [place] developed an idea known as the Healthy Eating Plate. Just imagine a plate divided into logical, healthy portions.   The emphasis is on diet quality:   Load up on vegetables and fruits - one-half of your plate: Aim for color and variety, and remember that potatoes dont count.   Go for whole grains - one-quarter  of your plate: Whole wheat, barley, wheat berries, quinoa, oats, brown rice, and foods made with them. If you want pasta, go with whole wheat pasta.   Protein power - one-quarter of your plate: Fish, chicken, beans, and nuts are all healthy, versatile protein sources. Limit red meat.   The diet, however, does go beyond the plate, offering a few other suggestions.   Use healthy plant oils, such as olive, canola, soy, corn, sunflower and peanut. Check the labels, and avoid partially hydrogenated oil, which have unhealthy trans fats.   If youre thirsty, drink water. Coffee and tea are good in moderation, but skip sugary drinks and limit milk and dairy products to one or two daily servings.   The type of carbohydrate in the diet is more important than the amount. Some sources of carbohydrates, such as vegetables, fruits, whole grains, and beans-are healthier than others.   Finally, stay active  Signed, Berniece Salines, DO  01/10/2020 11:14 AM    [place]

## 2020-01-10 NOTE — Patient Instructions (Signed)
Medication Instructions:  Your physician has recommended you make the following change in your medication:   Take Nitroglycerin as needed for chest pain.  *If you need a refill on your cardiac medications before your next appointment, please call your pharmacy*   Lab Work: Your physician recommends that you return for lab work in: 1 week prior to your CT. You can come Monday through Friday 8:30 am to 12:00 pm and 1:15 to 4:30. You do not need to make an appointment as the order has already been placed.   If you have labs (blood work) drawn today and your tests are completely normal, you will receive your results only by: Marland Kitchen MyChart Message (if you have MyChart) OR . A paper copy in the mail If you have any lab test that is abnormal or we need to change your treatment, we will call you to review the results.   Testing/Procedures: Your cardiac CT will be scheduled at:   [facility] [address] [place], [place] [address] [phone]  If scheduled at [facility], please arrive at the [facility] main entrance of [facility] 30 minutes prior to test start time. Proceed to the [facility] Radiology Department (first floor) to check-in and test prep.  Please follow these instructions carefully (unless otherwise directed):  Hold all erectile dysfunction medications at least 3 days (72 hrs) prior to test.  On the Night Before the Test: . Be sure to Drink plenty of water. . Do not consume any caffeinated/decaffeinated beverages or chocolate 12 hours prior to your test. . Do not take any antihistamines 12 hours prior to your test.   On the Day of the Test: . Drink plenty of water. Do not drink any water within one hour of the test. . Do not eat any food 4 hours prior to the test. . You may take your regular medications prior to the test.  . Take metoprolol (Lopressor) two hours prior to test. . HOLD Furosemide  morning of the test.        After the  Test: . Drink plenty of water. . After receiving IV contrast, you may experience a mild flushed feeling. This is normal. . On occasion, you may experience a mild rash up to 24 hours after the test. This is not dangerous. If this occurs, you can take Benadryl 25 mg and increase your fluid intake. . If you experience trouble breathing, this can be serious. If it is severe call 911 IMMEDIATELY. If it is mild, please call our office. . If you take any of these medications: Glipizide/Metformin, Avandament, Glucavance, please do not take 48 hours after completing test unless otherwise instructed.   Once we have confirmed authorization from your insurance company, we will call you to set up a date and time for your test. Based on how quickly your insurance processes prior authorizations requests, please allow up to 4 weeks to be contacted for scheduling your Cardiac CT appointment. Be advised that routine Cardiac CT appointments could be scheduled as many as 8 weeks after your provider has ordered it.  For non-scheduling related questions, please contact the cardiac imaging nurse navigator should you have any questions/concerns: Marchia Bond, Cardiac Imaging Nurse Navigator Burley Saver, Interim Cardiac Imaging Nurse [facility] Direct Office Dial: [phone]   For scheduling needs, including cancellations and rescheduling, please call Vivien Rota at [phone], option 3.      Follow-Up: At [facility], you and  your health needs are our priority.  As part of our continuing mission to provide you with exceptional heart care, we have created designated Provider Care Teams.  These Care Teams include your primary Cardiologist (physician) and Advanced Practice Providers (APPs -  Physician Assistants and Nurse Practitioners) who all work together to provide you with the care you need, when you need it.  We recommend signing up for the patient portal called "MyChart".   Sign up information is provided on this After Visit Summary.  MyChart is used to connect with patients for Virtual Visits (Telemedicine).  Patients are able to view lab/test results, encounter notes, upcoming appointments, etc.  Non-urgent messages can be sent to your provider as well.   To learn more about what you can do with MyChart, go to [email or web address].    Your next appointment:   3 month(s)  The format for your next appointment:   In Person  Provider:   Jyl Heinz, MD   Other Instructions  Cardiac CT Angiogram A cardiac CT angiogram is a procedure to look at the heart and the area around the heart. It may be done to help find the cause of chest pains or other symptoms of heart disease. During this procedure, a substance called contrast dye is injected into the blood vessels in the area to be checked. A large X-ray machine, called a CT scanner, then takes detailed pictures of the heart and the surrounding area. The procedure is also sometimes called a coronary CT angiogram, coronary artery scanning, or CTA. A cardiac CT angiogram allows the health care provider to see how well blood is flowing to and from the heart. The health care provider will be able to see if there are any problems, such as:  Blockage or narrowing of the coronary arteries in the heart.  Fluid around the heart.  Signs of weakness or disease in the muscles, valves, and tissues of the heart. Tell a health care provider about:  Any allergies you have. This is especially important if you have had a previous allergic reaction to contrast dye.  All medicines you are taking, including vitamins, herbs, eye drops, creams, and over-the-counter medicines.  Any blood disorders you have.  Any surgeries you have had.  Any medical conditions you have.  Whether you are pregnant or may be pregnant.  Any anxiety disorders, chronic pain, or other conditions you have that may increase your stress or prevent you  from lying still. What are the risks? Generally, this is a safe procedure. However, problems may occur, including:  Bleeding.  Infection.  Allergic reactions to medicines or dyes.  Damage to other structures or organs.  Kidney damage from the contrast dye that is used.  Increased risk of cancer from radiation exposure. This risk is low. Talk with your health care provider about: ? The risks and benefits of testing. ? How you can receive the lowest dose of radiation. What happens before the procedure?  Wear comfortable clothing and remove any jewelry, glasses, dentures, and hearing aids.  Follow instructions from your health care provider about eating and drinking. This may include: ? For 12 hours before the procedure -- avoid caffeine. This includes tea, coffee, soda, energy drinks, and diet pills. Drink plenty of water or other fluids that do not have caffeine in them. Being well hydrated can prevent complications. ? For 4-6 hours before the procedure -- stop eating and drinking. The contrast dye can cause nausea, but this is less likely if your  stomach is empty.  Ask your health care provider about changing or stopping your regular medicines. This is especially important if you are taking diabetes medicines, blood thinners, or medicines to treat problems with erections (erectile dysfunction). What happens during the procedure?   Hair on your chest may need to be removed so that small sticky patches called electrodes can be placed on your chest. These will transmit information that helps to monitor your heart during the procedure.  An IV will be inserted into one of your veins.  You might be given a medicine to control your heart rate during the procedure. This will help to ensure that good images are obtained.  You will be asked to lie on an exam table. This table will slide in and out of the CT machine during the procedure.  Contrast dye will be injected into the IV. You might  feel warm, or you may get a metallic taste in your mouth.  You will be given a medicine called nitroglycerin. This will relax or dilate the arteries in your heart.  The table that you are lying on will move into the CT machine tunnel for the scan.  The person running the machine will give you instructions while the scans are being done. You may be asked to: ? Keep your arms above your head. ? Hold your breath. ? Stay very still, even if the table is moving.  When the scanning is complete, you will be moved out of the machine.  The IV will be removed. The procedure may vary among health care providers and hospitals. What can I expect after the procedure? After your procedure, it is common to have:  A metallic taste in your mouth from the contrast dye.  A feeling of warmth.  A headache from the nitroglycerin. Follow these instructions at home:  Take over-the-counter and prescription medicines only as told by your health care provider.  If you are told, drink enough fluid to keep your urine pale yellow. This will help to flush the contrast dye out of your body.  Most people can return to their normal activities right after the procedure. Ask your health care provider what activities are safe for you.  It is up to you to get the results of your procedure. Ask your health care provider, or the department that is doing the procedure, when your results will be ready.  Keep all follow-up visits as told by your health care provider. This is important. Contact a health care provider if:  You have any symptoms of allergy to the contrast dye. These include: ? Shortness of breath. ? Rash or hives. ? A racing heartbeat. Summary  A cardiac CT angiogram is a procedure to look at the heart and the area around the heart. It may be done to help find the cause of chest pains or other symptoms of heart disease.  During this procedure, a large X-ray machine, called a CT scanner, takes detailed  pictures of the heart and the surrounding area after a contrast dye has been injected into blood vessels in the area.  Ask your health care provider about changing or stopping your regular medicines before the procedure. This is especially important if you are taking diabetes medicines, blood thinners, or medicines to treat erectile dysfunction.  If you are told, drink enough fluid to keep your urine pale yellow. This will help to flush the contrast dye out of your body. This information is not intended to replace advice given  to you by your health care provider. Make sure you discuss any questions you have with your health care provider. Document Revised: 02/07/2019 Document Reviewed: 02/07/2019 Elsevier Patient Education  [place].

## 2020-01-30 DIAGNOSIS — H2513 Age-related nuclear cataract, bilateral: Secondary | ICD-10-CM | POA: Diagnosis not present

## 2020-02-20 ENCOUNTER — Other Ambulatory Visit: Payer: Self-pay | Admitting: Cardiology

## 2020-02-20 DIAGNOSIS — I4819 Other persistent atrial fibrillation: Secondary | ICD-10-CM | POA: Diagnosis not present

## 2020-02-20 DIAGNOSIS — I10 Essential (primary) hypertension: Secondary | ICD-10-CM | POA: Diagnosis not present

## 2020-02-20 LAB — BASIC METABOLIC PANEL
BUN/Creatinine Ratio: 10 (ref 10–24)
BUN: 9 mg/dL (ref 8–27)
CO2: 24 mmol/L (ref 20–29)
Calcium: 9.2 mg/dL (ref 8.6–10.2)
Chloride: 105 mmol/L (ref 96–106)
Creatinine, Ser: 0.87 mg/dL (ref 0.76–1.27)
GFR calc Af Amer: 98 mL/min/{1.73_m2} (ref >59)
GFR calc non Af Amer: 84 mL/min/{1.73_m2} (ref >59)
Glucose: 102 mg/dL — ABNORMAL HIGH (ref 65–99)
Potassium: 4.3 mmol/L (ref 3.5–5.2)
Sodium: 142 mmol/L (ref 134–144)

## 2020-02-22 ENCOUNTER — Telehealth (HOSPITAL_COMMUNITY): Payer: Self-pay | Admitting: Emergency Medicine

## 2020-02-22 NOTE — Telephone Encounter (Signed)
Attempted to call patient regarding upcoming cardiac CT appointment. °Left message on voicemail with name and callback number °Elzina Devera RN Navigator Cardiac Imaging °[facility] °[phone] Office °[phone] Cell ° °

## 2020-02-22 NOTE — Telephone Encounter (Signed)
Pt returning phone call regarding upcoming cardiac imaging study; pt verbalizes understanding of appt date/time, parking situation and where to check in, pre-test NPO status and medications ordered, and verified current allergies; name and call back number provided for further questions should they arise Izaih Kataoka RN Navigator Cardiac Imaging [facility] [phone] office [phone] cell   

## 2020-02-25 ENCOUNTER — Ambulatory Visit (HOSPITAL_COMMUNITY)
Admission: RE | Admit: 2020-02-25 | Discharge: 2020-02-25 | Disposition: A | Discharge status: Home / Self Care | Payer: Medicare HMO | Source: Ambulatory Visit | Attending: Cardiology | Admitting: Cardiology

## 2020-02-25 ENCOUNTER — Other Ambulatory Visit: Payer: Self-pay

## 2020-02-25 DIAGNOSIS — R072 Precordial pain: Secondary | ICD-10-CM | POA: Insufficient documentation

## 2020-02-25 MED ORDER — NITROGLYCERIN 0.4 MG SL SUBL
0.8000 mg | SUBLINGUAL_TABLET | Freq: Once | SUBLINGUAL | Status: AC
  Administered 2020-02-25: 0.8 mg via SUBLINGUAL

## 2020-02-25 MED ORDER — NITROGLYCERIN 0.4 MG SL SUBL
SUBLINGUAL_TABLET | SUBLINGUAL | Status: AC
  Filled 2020-02-25: qty 2

## 2020-02-25 MED ORDER — IOHEXOL 350 MG/ML SOLN
80.0000 mL | Freq: Once | INTRAVENOUS | Status: AC | PRN
  Administered 2020-02-25: 80 mL via INTRAVENOUS

## 2020-03-13 ENCOUNTER — Other Ambulatory Visit: Payer: Self-pay

## 2020-03-13 MED ORDER — POTASSIUM CHLORIDE CRYS ER 20 MEQ PO TBCR: 20.0000 meq | EXTENDED_RELEASE_TABLET | Freq: Every day | ORAL | 2 refills | Status: DC

## 2020-03-13 NOTE — Telephone Encounter (Signed)
Refill sent to [facility] for Potassium Chloride .

## 2020-04-10 DIAGNOSIS — R053 Chronic cough: Secondary | ICD-10-CM | POA: Insufficient documentation

## 2020-04-15 ENCOUNTER — Other Ambulatory Visit: Payer: Self-pay

## 2020-04-15 ENCOUNTER — Ambulatory Visit (INDEPENDENT_AMBULATORY_CARE_PROVIDER_SITE_OTHER): Payer: Medicare HMO | Admitting: Cardiology

## 2020-04-15 ENCOUNTER — Encounter: Payer: Self-pay | Admitting: Cardiology

## 2020-04-15 VITALS — BP 124/88 | HR 59 | Ht 71.0 in | Wt 257.2 lb

## 2020-04-15 DIAGNOSIS — I2510 Atherosclerotic heart disease of native coronary artery without angina pectoris: Secondary | ICD-10-CM | POA: Diagnosis not present

## 2020-04-15 DIAGNOSIS — I5032 Chronic diastolic (congestive) heart failure: Secondary | ICD-10-CM

## 2020-04-15 DIAGNOSIS — E669 Obesity, unspecified: Secondary | ICD-10-CM

## 2020-04-15 DIAGNOSIS — I480 Paroxysmal atrial fibrillation: Secondary | ICD-10-CM | POA: Diagnosis not present

## 2020-04-15 DIAGNOSIS — I10 Essential (primary) hypertension: Secondary | ICD-10-CM

## 2020-04-15 MED ORDER — FUROSEMIDE 40 MG PO TABS: 40.0000 mg | ORAL_TABLET | ORAL | 1 refills | Status: DC | PRN

## 2020-04-15 NOTE — Progress Notes (Signed)
Cardiology Office Note:    Date:  04/15/2020   ID:  Thomas Knight, DOB 10-Nov-1945, MRN [id number]  PCP:  Thomas Sheriff, MD  Cardiologist:  Thomas Salines, DO  Electrophysiologist:  None   Referring MD: Thomas Sheriff, MD  " I am doing well"  History of Present Illness:    Thomas Knight is a 74 y.o. male with a hx of minimal coronary artery disease seen on coronary CTA Eliquis twice daily and metoprolol 25 mg twice a day, he is status post cardioversion and remains in sinus rhythm on his metoprolol.  He had declined use of antiarrhythmic.  History of COVID-19 infection back in March 2021...................Marland Kitchen  At his last visit he complained of precordial chest pain given the fact that he does have risk factors I sent him for cardiac CTA.  In the interim he was able to get his cardiac CTA which showed minimal coronary artery disease. Today he offers no complaints.   Past Medical History:  Diagnosis Date  . Bilateral leg edema 08/24/2019  . Chronic cough   . Chronic pain of left knee 12/01/2015  . Essential hypertension 08/24/2019  . Mixed hyperlipidemia 08/24/2019  . Obesity (BMI 30-39.9) 08/24/2019  . Persistent atrial fibrillation ([place]) 08/24/2019  . Shortness of breath 08/24/2019    Past Surgical History:  Procedure Laterality Date  . ACHILLES TENDON REPAIR Left 2012   Ruptured  . HERNIA REPAIR     umbilical hernia  . SKIN SURGERY Left    Basal cell carcinoma on left forearm    Current Medications: Current Meds  Medication Sig  . Ascorbic Acid (VITAMIN C WITH ROSE HIPS) 500 MG tablet Take 500 mg by mouth daily.  Marland Kitchen aspirin 81 MG EC tablet Take 81 mg by mouth daily.  . Cholecalciferol (D3-1000) 25 MCG (1000 UT) capsule Take 1,000 Units by mouth daily.  Marland Kitchen ELIQUIS 5 MG TABS tablet Take 1 tablet (5 mg total) by mouth 2 (two) times daily.  . furosemide (LASIX) 40 MG tablet Take 1 tablet (40 mg total) by mouth as needed.  . metoprolol tartrate (LOPRESSOR) 25 MG tablet Take  1 tablet (25 mg total) by mouth 2 (two) times daily.  Marland Kitchen OVER THE COUNTER MEDICATION daily. Instaflex featuring UC-II Collagen/ Relieves discomfort, improves flexibility, joint relief in 7 days  . potassium chloride SA (KLOR-CON M20) 20 MEQ tablet Take 1 tablet (20 mEq total) by mouth daily.  . Zinc 50 MG CAPS Take by mouth.  . [DISCONTINUED] furosemide (LASIX) 40 MG tablet Take 1 tablet (40 mg total) by mouth daily.     Allergies:   Patient has no known allergies.   Social History   Socioeconomic History  . Marital status: Married    Spouse name: Not on file  . Number of children: Not on file  . Years of education: Not on file  . Highest education level: Not on file  Occupational History  . Not on file  Tobacco Use  . Smoking status: Never Smoker  . Smokeless tobacco: Never Used  Substance and Sexual Activity  . Alcohol use: Never  . Drug use: Never  . Sexual activity: Not on file  Other Topics Concern  . Not on file  Social History Narrative  . Not on file   Social Determinants of Health   Financial Resource Strain:   . Difficulty of Paying Living Expenses: Not on file  Food Insecurity:   . Worried About Charity fundraiser in  the Last Year: Not on file  . Ran Out of Food in the Last Year: Not on file  Transportation Needs:   . Lack of Transportation (Medical): Not on file  . Lack of Transportation (Non-Medical): Not on file  Physical Activity:   . Days of Exercise per Week: Not on file  . Minutes of Exercise per Session: Not on file  Stress:   . Feeling of Stress : Not on file  Social Connections:   . Frequency of Communication with Friends and Family: Not on file  . Frequency of Social Gatherings with Friends and Family: Not on file  . Attends Religious Services: Not on file  . Active Member of Clubs [name]: Not on file  . Attends Archivist Meetings: Not on file  . Marital Status: Not on file     Family History: The patient's family  history includes Lung cancer in his father.  ROS:   Review of Systems  Constitution: Negative for decreased appetite, fever and weight gain.  HENT: Negative for congestion, ear discharge, hoarse voice and sore throat.   Eyes: Negative for discharge, redness, vision loss in right eye and visual halos.  Cardiovascular: Negative for chest pain, dyspnea on exertion, leg swelling, orthopnea and palpitations.  Respiratory: Negative for cough, hemoptysis, shortness of breath and snoring.   Endocrine: Negative for heat intolerance and polyphagia.  Hematologic/Lymphatic: Negative for bleeding problem. Does not bruise/bleed easily.  Skin: Negative for flushing, nail changes, rash and suspicious lesions.  Musculoskeletal: Negative for arthritis, joint pain, muscle cramps, myalgias, neck pain and stiffness.  Gastrointestinal: Negative for abdominal pain, bowel incontinence, diarrhea and excessive appetite.  Genitourinary: Negative for decreased libido, genital sores and incomplete emptying.  Neurological: Negative for brief paralysis, focal weakness, headaches and loss of balance.  Psychiatric/Behavioral: Negative for altered mental status, depression and suicidal ideas.  Allergic/Immunologic: Negative for HIV exposure and persistent infections.    EKGs/Labs/Other Studies Reviewed:    The following studies were reviewed today:   EKG: None today  CCTA IMPRESSION: 1. Coronary calcium score of 12. This was 13th percentile for age and sex matched control.  2.  Normal coronary origin with right dominance.  3.  Minimal atherosclerosis.  CAD RADS 1.  4.  Consider non-atherosclerotic causes of chest pain.  5.  Recommend preventive therapy and risk factor modification.  Thomas Knight  Recent Labs: 08/01/2019: Hemoglobin 16.5; Platelets 198 08/24/2019: Magnesium 2.3 02/20/2020: BUN 9; Creatinine, Ser 0.87; Potassium 4.3; Sodium 142  Recent Lipid Panel No results found for: CHOL, TRIG, HDL,  CHOLHDL, VLDL, LDLCALC, LDLDIRECT  Physical Exam:    VS:  BP 124/88   Pulse (!) 59   Ht 5\' 11"  (1.803 m)   Wt 257 lb 3.2 oz (116.7 kg)   SpO2 95%   BMI 35.87 kg/m     Wt Readings from Last 3 Encounters:  04/15/20 257 lb 3.2 oz (116.7 kg)  01/10/20 261 lb 9.6 oz (118.7 kg)  09/21/19 263 lb (119.3 kg)     GEN: Well nourished, well developed in no acute distress HEENT: Normal NECK: No JVD; No carotid bruits LYMPHATICS: No lymphadenopathy CARDIAC: S1S2 noted,RRR, no murmurs, rubs, gallops RESPIRATORY:  Clear to auscultation without rales, wheezing Knight rhonchi  ABDOMEN: Soft, non-tender, non-distended, +bowel sounds, no guarding. EXTREMITIES: No edema, No cyanosis, no clubbing MUSCULOSKELETAL:  No deformity  SKIN: Warm and dry NEUROLOGIC:  Alert and oriented x 3, non-focal PSYCHIATRIC:  Normal affect, good insight  ASSESSMENT:  1. Minimal CAD   2. PAF (paroxysmal atrial fibrillation) ([place])   3. Essential hypertension   4. Chronic diastolic heart failure (HCC)   5. Obesity (BMI 30-39.9)    PLAN:     He seems to be doing well from a cardiovascular standpoint.  He has euvolemic and we discussed the use of his Lasix as needed.  I have educated the patient that if he gains 3 pounds in 2 days and 5 pounds in 1 week to take a Lasix dose.  Otherwise we can hold this medication.  He will continue his Eliquis 5 mg twice daily.  As well as stay on his beta-blocker.  We discussed his CT scan finding which show very minimal coronary artery disease.  He is not currently on a statin he will like to get a repeat lipid profile with his PCP and then I will recommend further use of statin.  His blood pressure is acceptable no changes will be made to his antihypertensive regimen.  The patient understands the need to lose weight with diet and exercise. We have discussed specific strategies for this.  The patient is in agreement with the above plan. The patient left the office in stable  condition.  The patient will follow up in 6 months Knight sooner if needed.   Medication Adjustments/Labs and Tests Ordered: Current medicines are reviewed at length with the patient today.  Concerns regarding medicines are outlined above.  No orders of the defined types were placed in this encounter.  Meds ordered this encounter  Medications  . furosemide (LASIX) 40 MG tablet    Sig: Take 1 tablet (40 mg total) by mouth as needed.    Dispense:  90 tablet    Refill:  1    Patient Instructions  Medication Instructions:  Start taking Lasix 40 mg as needed *If you need a refill on your cardiac medications before your next appointment, please call your pharmacy*   Lab Work: None If you have labs (blood work) drawn today and your tests are completely normal, you will receive your results only by: Marland Kitchen MyChart Message (if you have MyChart) Knight . A paper copy in the mail If you have any lab test that is abnormal Knight we need to change your treatment, we will call you to review the results.   Testing/Procedures: None   Follow-Up: At [facility], you and your health needs are our priority.  As part of our continuing mission to provide you with exceptional heart care, we have created designated Provider Care Teams.  These Care Teams include your primary Cardiologist (physician) and Advanced Practice Providers (APPs -  Physician Assistants and Nurse Practitioners) who all work together to provide you with the care you need, when you need it.  We recommend signing up for the patient portal called "MyChart".  Sign up information is provided on this After Visit Summary.  MyChart is used to connect with patients for Virtual Visits (Telemedicine).  Patients are able to view lab/test results, encounter notes, upcoming appointments, etc.  Non-urgent messages can be sent to your provider as well.   To learn more about what you can do with MyChart, go to [email or web address].    Your next appointment:    6 month(s)  The format for your next appointment:   In Person  Provider:   Berniece Salines, DO   Other Instructions      Adopting a Healthy Lifestyle.  Know what a healthy weight is for you (roughly BMI <  25) and aim to maintain this   Aim for 7+ servings of fruits and vegetables daily   65-80+ fluid ounces of water Knight unsweet tea for healthy kidneys   Limit to max 1 drink of alcohol per day; avoid smoking/tobacco   Limit animal fats in diet for cholesterol and heart health - choose grass fed whenever available   Avoid highly processed foods, and foods high in saturated/trans fats   Aim for low stress - take time to unwind and care for your mental health   Aim for 150 min of moderate intensity exercise weekly for heart health, and weights twice weekly for bone health   Aim for 7-9 hours of sleep daily   When it comes to diets, agreement about the perfect plan isnt easy to find, even among the experts. Experts at the [employer] developed an idea known as the Healthy Eating Plate. Just imagine a plate divided into logical, healthy portions.   The emphasis is on diet quality:   Load up on vegetables and fruits - one-half of your plate: Aim for color and variety, and remember that potatoes dont count.   Go for whole grains - one-quarter of your plate: Whole wheat, barley, wheat berries, quinoa, oats, brown rice, and foods made with them. If you want pasta, go with whole wheat pasta.   Protein power - one-quarter of your plate: Fish, chicken, beans, and nuts are all healthy, versatile protein sources. Limit red meat.   The diet, however, does go beyond the plate, offering a few other suggestions.   Use healthy plant oils, such as olive, canola, soy, corn, sunflower and peanut. Check the labels, and avoid partially hydrogenated oil, which have unhealthy trans fats.   If youre thirsty, drink water. Coffee and tea are good in moderation, but skip sugary  drinks and limit milk and dairy products to one Knight two daily servings.   The type of carbohydrate in the diet is more important than the amount. Some sources of carbohydrates, such as vegetables, fruits, whole grains, and beans-are healthier than others.   Finally, stay active  Signed, Thomas Salines, DO  04/15/2020 2:27 PM    [facility]

## 2020-04-15 NOTE — Patient Instructions (Addendum)
Medication Instructions:  Start taking Lasix 40 mg as needed *If you need a refill on your cardiac medications before your next appointment, please call your pharmacy*   Lab Work: None If you have labs (blood work) drawn today and your tests are completely normal, you will receive your results only by: Marland Kitchen MyChart Message (if you have MyChart) OR . A paper copy in the mail If you have any lab test that is abnormal or we need to change your treatment, we will call you to review the results.   Testing/Procedures: None   Follow-Up: At [facility], you and your health needs are our priority.  As part of our continuing mission to provide you with exceptional heart care, we have created designated Provider Care Teams.  These Care Teams include your primary Cardiologist (physician) and Advanced Practice Providers (APPs -  Physician Assistants and Nurse Practitioners) who all work together to provide you with the care you need, when you need it.  We recommend signing up for the patient portal called "MyChart".  Sign up information is provided on this After Visit Summary.  MyChart is used to connect with patients for Virtual Visits (Telemedicine).  Patients are able to view lab/test results, encounter notes, upcoming appointments, etc.  Non-urgent messages can be sent to your provider as well.   To learn more about what you can do with MyChart, go to [email or web address].    Your next appointment:   6 month(s)  The format for your next appointment:   In Person  Provider:   Berniece Salines, DO   Other Instructions

## 2020-06-26 DIAGNOSIS — Z23 Encounter for immunization: Secondary | ICD-10-CM | POA: Diagnosis not present

## 2020-07-21 ENCOUNTER — Other Ambulatory Visit: Payer: Self-pay

## 2020-07-21 MED ORDER — ELIQUIS 5 MG PO TABS: 5.0000 mg | ORAL_TABLET | Freq: Two times a day (BID) | ORAL | 2 refills | Status: DC

## 2020-08-21 DIAGNOSIS — H6121 Impacted cerumen, right ear: Secondary | ICD-10-CM | POA: Diagnosis not present

## 2020-08-21 DIAGNOSIS — H9190 Unspecified hearing loss, unspecified ear: Secondary | ICD-10-CM | POA: Diagnosis not present

## 2020-09-15 DIAGNOSIS — Z1331 Encounter for screening for depression: Secondary | ICD-10-CM | POA: Diagnosis not present

## 2020-09-15 DIAGNOSIS — Z0000 Encounter for general adult medical examination without abnormal findings: Secondary | ICD-10-CM | POA: Diagnosis not present

## 2020-09-15 DIAGNOSIS — E785 Hyperlipidemia, unspecified: Secondary | ICD-10-CM | POA: Diagnosis not present

## 2020-09-15 DIAGNOSIS — Z6837 Body mass index (BMI) 37.0-37.9, adult: Secondary | ICD-10-CM | POA: Diagnosis not present

## 2020-09-15 DIAGNOSIS — Z79899 Other long term (current) drug therapy: Secondary | ICD-10-CM | POA: Diagnosis not present

## 2020-09-19 ENCOUNTER — Telehealth: Payer: Self-pay

## 2020-09-19 MED ORDER — METOPROLOL TARTRATE 25 MG PO TABS: 25.0000 mg | ORAL_TABLET | Freq: Two times a day (BID) | ORAL | 2 refills | Status: DC

## 2020-09-19 NOTE — Telephone Encounter (Signed)
Refill sent to pharmacy.   

## 2020-09-28 DIAGNOSIS — G4730 Sleep apnea, unspecified: Secondary | ICD-10-CM | POA: Diagnosis not present

## 2020-09-30 DIAGNOSIS — Z1211 Encounter for screening for malignant neoplasm of colon: Secondary | ICD-10-CM | POA: Diagnosis not present

## 2020-09-30 DIAGNOSIS — Z1212 Encounter for screening for malignant neoplasm of rectum: Secondary | ICD-10-CM | POA: Diagnosis not present

## 2020-10-21 ENCOUNTER — Ambulatory Visit: Payer: Medicare HMO | Admitting: Cardiology

## 2020-10-21 ENCOUNTER — Encounter: Payer: Self-pay | Admitting: Cardiology

## 2020-10-21 ENCOUNTER — Other Ambulatory Visit: Payer: Self-pay

## 2020-10-21 VITALS — BP 124/72 | HR 60 | Ht 71.0 in | Wt 264.6 lb

## 2020-10-21 DIAGNOSIS — I4891 Unspecified atrial fibrillation: Secondary | ICD-10-CM | POA: Insufficient documentation

## 2020-10-21 DIAGNOSIS — I5032 Chronic diastolic (congestive) heart failure: Secondary | ICD-10-CM

## 2020-10-21 DIAGNOSIS — I2510 Atherosclerotic heart disease of native coronary artery without angina pectoris: Secondary | ICD-10-CM

## 2020-10-21 DIAGNOSIS — G4733 Obstructive sleep apnea (adult) (pediatric): Secondary | ICD-10-CM

## 2020-10-21 DIAGNOSIS — E669 Obesity, unspecified: Secondary | ICD-10-CM | POA: Diagnosis not present

## 2020-10-21 NOTE — Patient Instructions (Addendum)
Medication Instructions:  Your physician recommends that you continue on your current medications as directed. Please refer to the Current Medication list given to you today.  *If you need a refill on your cardiac medications before your next appointment, please call your pharmacy*   Lab Work: None If you have labs (blood work) drawn today and your tests are completely normal, you will receive your results only by: Marland Kitchen MyChart Message (if you have MyChart) OR . A paper copy in the mail If you have any lab test that is abnormal or we need to change your treatment, we will call you to review the results.   Testing/Procedures: None   Follow-Up: At [facility], you and your health needs are our priority.  As part of our continuing mission to provide you with exceptional heart care, we have created designated Provider Care Teams.  These Care Teams include your primary Cardiologist (physician) and Advanced Practice Providers (APPs -  Physician Assistants and Nurse Practitioners) who all work together to provide you with the care you need, when you need it.  We recommend signing up for the patient portal called "MyChart".  Sign up information is provided on this After Visit Summary.  MyChart is used to connect with patients for Virtual Visits (Telemedicine).  Patients are able to view lab/test results, encounter notes, upcoming appointments, etc.  Non-urgent messages can be sent to your provider as well.   To learn more about what you can do with MyChart, go to [email or web address].    Your next appointment:   6 month(s)  The format for your next appointment:   In Person  Provider:   Berniece Salines, DO   Other Instruction Okay to hold Eliquis for 4 doses prior to your Colonoscopy.

## 2020-10-21 NOTE — Progress Notes (Signed)
Cardiology Office Note:    Date:  10/21/2020   ID:  Thomas Knight, DOB 1945-10-22, MRN [id number]  PCP:  Angelina Sheriff, MD  Cardiologist:  Berniece Salines, DO  Electrophysiologist:  None   Referring MD: Angelina Sheriff, MD   I am doing fine  History of Present Illness:    Thomas Knight is a 75 y.o. male with a hx of minimal coronary artery disease seen on coronary CTA Eliquis twice daily and metoprolol 25 mg twice a day, he is status post cardioversion and remains in sinus rhythm on his metoprolol.  He had declined use of antiarrhythmic.  History of COVID-19 infection back in March 2021.  When I last saw the patient October 2021 at that time he appeared to be doing well from a cardiovascular standpoint.  We discussed his report from his CTA which show minimal CAD as well as we talked about managing his paroxysmal atrial fibrillation on his medication regimen which included beta-blocker as well as Eliquis 5 mg twice a day.  He is here today for follow-up visit.  Patient tells me today that he had been experiencing some shortness of breath only when he is carrying things as he is not himself.  He also noted that he has a planned colonoscopy in [facility] and is waiting for date of the procedure.  No chest pain, no lightheadedness no dizziness.  Of note he tells me that he had a home sleep study which was set up by his PCP and he was noted to have severe sleep apnea however he has not heard back about a CPAP setting.  Past Medical History:  Diagnosis Date  . Bilateral leg edema 08/24/2019  . Chronic cough   . Chronic pain of left knee 12/01/2015  . Essential hypertension 08/24/2019  . Mixed hyperlipidemia 08/24/2019  . Obesity (BMI 30-39.9) 08/24/2019  . Persistent atrial fibrillation ([place]) 08/24/2019  . Shortness of breath 08/24/2019    Past Surgical History:  Procedure Laterality Date  . ACHILLES TENDON REPAIR Left 2012   Ruptured  . HERNIA REPAIR     umbilical hernia   . SKIN SURGERY Left    Basal cell carcinoma on left forearm    Current Medications: Current Meds  Medication Sig  . Ascorbic Acid (VITAMIN C WITH ROSE HIPS) 500 MG tablet Take 500 mg by mouth daily.  . Cholecalciferol (D3-1000) 25 MCG (1000 UT) capsule Take 1,000 Units by mouth daily.  Marland Kitchen ELIQUIS 5 MG TABS tablet Take 1 tablet (5 mg total) by mouth 2 (two) times daily.  . furosemide (LASIX) 40 MG tablet Take 1 tablet (40 mg total) by mouth as needed.  . metoprolol tartrate (LOPRESSOR) 25 MG tablet Take 1 tablet (25 mg total) by mouth 2 (two) times daily.  . nitroGLYCERIN (NITROSTAT) 0.4 MG SL tablet Place 1 tablet (0.4 mg total) under the tongue every 5 (five) minutes as needed.  Marland Kitchen OVER THE COUNTER MEDICATION daily. Instaflex featuring UC-II Collagen/ Relieves discomfort, improves flexibility, joint relief in 7 days  . potassium chloride SA (KLOR-CON M20) 20 MEQ tablet Take 1 tablet (20 mEq total) by mouth daily.  . Zinc 50 MG CAPS Take by mouth.     Allergies:   Patient has no known allergies.   Social History   Socioeconomic History  . Marital status: Married    Spouse name: Not on file  . Number of children: Not on file  . Years of education: Not on file  .  Highest education level: Not on file  Occupational History  . Not on file  Tobacco Use  . Smoking status: Never Smoker  . Smokeless tobacco: Never Used  Substance and Sexual Activity  . Alcohol use: Never  . Drug use: Never  . Sexual activity: Not on file  Other Topics Concern  . Not on file  Social History Narrative  . Not on file   Social Determinants of Health   Financial Resource Strain: Not on file  Food Insecurity: Not on file  Transportation Needs: Not on file  Physical Activity: Not on file  Stress: Not on file  Social Connections: Not on file     Family History: The patient's family history includes Lung cancer in his father.  ROS:   Review of Systems  Constitution: Negative for decreased  appetite, fever and weight gain.  HENT: Negative for congestion, ear discharge, hoarse voice and sore throat.   Eyes: Negative for discharge, redness, vision loss in right eye and visual halos.  Cardiovascular: Negative for chest pain, dyspnea on exertion, leg swelling, orthopnea and palpitations.  Respiratory: Negative for cough, hemoptysis, shortness of breath and snoring.   Endocrine: Negative for heat intolerance and polyphagia.  Hematologic/Lymphatic: Negative for bleeding problem. Does not bruise/bleed easily.  Skin: Negative for flushing, nail changes, rash and suspicious lesions.  Musculoskeletal: Negative for arthritis, joint pain, muscle cramps, myalgias, neck pain and stiffness.  Gastrointestinal: Negative for abdominal pain, bowel incontinence, diarrhea and excessive appetite.  Genitourinary: Negative for decreased libido, genital sores and incomplete emptying.  Neurological: Negative for brief paralysis, focal weakness, headaches and loss of balance.  Psychiatric/Behavioral: Negative for altered mental status, depression and suicidal ideas.  Allergic/Immunologic: Negative for HIV exposure and persistent infections.    EKGs/Labs/Other Studies Reviewed:    The following studies were reviewed today:   EKG: None today   Coronary CTA February 25, 2020. FINDINGS: No pleural fluid.  Clear imaged lungs. Normal aortic caliber. Tortuous thoracic aorta. No central pulmonary embolism, on this non-dedicated study. No imaged thoracic adenopathy. Hepatic steatosis.  Normal imaged portions of the spleen, stomach. No acute osseous abnormality. IMPRESSION: No acute findings in the imaged extracardiac chest.  Recent Labs: 02/20/2020: BUN 9; Creatinine, Ser 0.87; Potassium 4.3; Sodium 142  Recent Lipid Panel No results found for: CHOL, TRIG, HDL, CHOLHDL, VLDL, LDLCALC, LDLDIRECT  Physical Exam:    VS:  BP 124/72   Pulse 60   Ht 5\' 11"  (1.803 m)   Wt 264 lb 9.6 oz (120 kg)   SpO2  97%   BMI 36.90 kg/m     Wt Readings from Last 3 Encounters:  10/21/20 264 lb 9.6 oz (120 kg)  04/15/20 257 lb 3.2 oz (116.7 kg)  01/10/20 261 lb 9.6 oz (118.7 kg)     GEN: Well nourished, well developed in no acute distress HEENT: Normal NECK: No JVD; No carotid bruits LYMPHATICS: No lymphadenopathy CARDIAC: S1S2 noted,RRR, no murmurs, rubs, gallops RESPIRATORY:  Clear to auscultation without rales, wheezing Knight rhonchi  ABDOMEN: Soft, non-tender, non-distended, +bowel sounds, no guarding. EXTREMITIES: No edema, No cyanosis, no clubbing MUSCULOSKELETAL:  No deformity  SKIN: Warm and dry NEUROLOGIC:  Alert and oriented x 3, non-focal PSYCHIATRIC:  Normal affect, good insight  ASSESSMENT:    1. Atrial fibrillation, unspecified type ([place])   2. Coronary artery disease involving native coronary artery of native heart without angina pectoris   3. Chronic diastolic heart failure (HCC)   4. Obesity (BMI 30-39.9)   5.  Obstructive sleep apnea syndrome    PLAN:     He appears to be doing well from a cardiovascular standpoint.  No changes will be made to his medications today.  I still do believe that if he has been diagnosed with severe sleep apnea he needs to be set up for CPAP -his sleep study was set up through his PCP which I will ask him to discuss with them hopefully for potential CPAP titration.  He is pending colonoscopy.  I discussed with the patient that prior to his colonoscopy he can hold his Eliquis for 4 doses (2 days) prior to his procedure.  He can start his Eliquis back minimal 12 hours at the discretion of the physician performing his procedure for  He also has some questions about what this means to have diastolic heart failure, we talked about his atrial fibrillation as well as he shared with me information from his PCP where he had lab work done with hemoglobin was 13.5 as well as a hematocrit of 39.  The patient understands the need to lose weight with diet and  exercise. We have discussed specific strategies for this.  The patient is in agreement with the above plan. The patient left the office in stable condition.  The patient will follow up in 6 months Knight sooner if needed.   Medication Adjustments/Labs and Tests Ordered: Current medicines are reviewed at length with the patient today.  Concerns regarding medicines are outlined above.  No orders of the defined types were placed in this encounter.  No orders of the defined types were placed in this encounter.   Patient Instructions  Medication Instructions:  Your physician recommends that you continue on your current medications as directed. Please refer to the Current Medication list given to you today.  *If you need a refill on your cardiac medications before your next appointment, please call your pharmacy*   Lab Work: None If you have labs (blood work) drawn today and your tests are completely normal, you will receive your results only by: Marland Kitchen MyChart Message (if you have MyChart) Knight . A paper copy in the mail If you have any lab test that is abnormal Knight we need to change your treatment, we will call you to review the results.   Testing/Procedures: None   Follow-Up: At [facility], you and your health needs are our priority.  As part of our continuing mission to provide you with exceptional heart care, we have created designated Provider Care Teams.  These Care Teams include your primary Cardiologist (physician) and Advanced Practice Providers (APPs -  Physician Assistants and Nurse Practitioners) who all work together to provide you with the care you need, when you need it.  We recommend signing up for the patient portal called "MyChart".  Sign up information is provided on this After Visit Summary.  MyChart is used to connect with patients for Virtual Visits (Telemedicine).  Patients are able to view lab/test results, encounter notes, upcoming appointments, etc.  Non-urgent messages  can be sent to your provider as well.   To learn more about what you can do with MyChart, go to [email or web address].    Your next appointment:   6 month(s)  The format for your next appointment:   In Person  Provider:   Berniece Salines, DO   Other Instruction Okay to hold Eliquis for 4 doses prior to your Colonoscopy.      Adopting a Healthy Lifestyle.  Know what a healthy weight is  for you (roughly BMI <25) and aim to maintain this   Aim for 7+ servings of fruits and vegetables daily   65-80+ fluid ounces of water Knight unsweet tea for healthy kidneys   Limit to max 1 drink of alcohol per day; avoid smoking/tobacco   Limit animal fats in diet for cholesterol and heart health - choose grass fed whenever available   Avoid highly processed foods, and foods high in saturated/trans fats   Aim for low stress - take time to unwind and care for your mental health   Aim for 150 min of moderate intensity exercise weekly for heart health, and weights twice weekly for bone health   Aim for 7-9 hours of sleep daily   When it comes to diets, agreement about the perfect plan isnt easy to find, even among the experts. Experts at the [employer] developed an idea known as the Healthy Eating Plate. Just imagine a plate divided into logical, healthy portions.   The emphasis is on diet quality:   Load up on vegetables and fruits - one-half of your plate: Aim for color and variety, and remember that potatoes dont count.   Go for whole grains - one-quarter of your plate: Whole wheat, barley, wheat berries, quinoa, oats, brown rice, and foods made with them. If you want pasta, go with whole wheat pasta.   Protein power - one-quarter of your plate: Fish, chicken, beans, and nuts are all healthy, versatile protein sources. Limit red meat.   The diet, however, does go beyond the plate, offering a few other suggestions.   Use healthy plant oils, such as olive, canola, soy,  corn, sunflower and peanut. Check the labels, and avoid partially hydrogenated oil, which have unhealthy trans fats.   If youre thirsty, drink water. Coffee and tea are good in moderation, but skip sugary drinks and limit milk and dairy products to one Knight two daily servings.   The type of carbohydrate in the diet is more important than the amount. Some sources of carbohydrates, such as vegetables, fruits, whole grains, and beans-are healthier than others.   Finally, stay active  Signed, Berniece Salines, DO  10/21/2020 4:52 PM    [facility]

## 2020-10-27 DIAGNOSIS — R195 Other fecal abnormalities: Secondary | ICD-10-CM | POA: Diagnosis not present

## 2020-10-27 DIAGNOSIS — R0602 Shortness of breath: Secondary | ICD-10-CM | POA: Diagnosis not present

## 2020-10-27 DIAGNOSIS — I482 Chronic atrial fibrillation, unspecified: Secondary | ICD-10-CM | POA: Diagnosis not present

## 2020-11-06 ENCOUNTER — Other Ambulatory Visit (HOSPITAL_COMMUNITY): Payer: Self-pay | Admitting: Gastroenterology

## 2020-11-06 ENCOUNTER — Encounter (HOSPITAL_BASED_OUTPATIENT_CLINIC_OR_DEPARTMENT_OTHER): Payer: Self-pay

## 2020-11-06 ENCOUNTER — Other Ambulatory Visit: Payer: Self-pay | Admitting: Gastroenterology

## 2020-11-06 ENCOUNTER — Other Ambulatory Visit: Payer: Self-pay

## 2020-11-06 ENCOUNTER — Ambulatory Visit (HOSPITAL_BASED_OUTPATIENT_CLINIC_OR_DEPARTMENT_OTHER)
Admission: RE | Admit: 2020-11-06 | Discharge: 2020-11-06 | Disposition: A | Discharge status: Home / Self Care | Payer: Medicare HMO | Source: Ambulatory Visit | Attending: Gastroenterology | Admitting: Gastroenterology

## 2020-11-06 DIAGNOSIS — R195 Other fecal abnormalities: Secondary | ICD-10-CM | POA: Diagnosis not present

## 2020-11-06 DIAGNOSIS — D374 Neoplasm of uncertain behavior of colon: Secondary | ICD-10-CM | POA: Diagnosis not present

## 2020-11-06 DIAGNOSIS — C184 Malignant neoplasm of transverse colon: Secondary | ICD-10-CM | POA: Diagnosis not present

## 2020-11-06 DIAGNOSIS — K5730 Diverticulosis of large intestine without perforation or abscess without bleeding: Secondary | ICD-10-CM | POA: Diagnosis not present

## 2020-11-06 DIAGNOSIS — K635 Polyp of colon: Secondary | ICD-10-CM | POA: Diagnosis not present

## 2020-11-06 DIAGNOSIS — E041 Nontoxic single thyroid nodule: Secondary | ICD-10-CM | POA: Diagnosis not present

## 2020-11-06 HISTORY — DX: Malignant (primary) neoplasm, unspecified: C80.1

## 2020-11-06 LAB — POCT I-STAT CREATININE: Creatinine, Ser: 0.9 mg/dL (ref 0.61–1.24)

## 2020-11-06 MED ORDER — IOHEXOL 300 MG/ML  SOLN
100.0000 mL | Freq: Once | INTRAMUSCULAR | Status: AC | PRN
  Administered 2020-11-06: 100 mL via INTRAVENOUS

## 2020-11-12 NOTE — Progress Notes (Signed)
I spoke with patient and his wife Thomas Knight regarding referral we received from Dr. Carol Ada for his newly diagnosed colon adenocarcinoma.  I referred this patient to [facility] for surgical consult and he is scheduled for 6/2 at 9:40 with Dr. Dema Severin.  I explained to him that colon cancer without distant metastasis requires first line treatment of colon surgery.  He will be scheduled with medical oncology 2 to 3 weeks after his surgery.  He lives in [place] and desires to be seen at [facility] in [place].  I explained I will monitor for his surgery date.  He has my direct number to call back if he has questions.  He verbalized an understanding.

## 2020-11-27 ENCOUNTER — Telehealth: Payer: Self-pay

## 2020-11-27 DIAGNOSIS — C189 Malignant neoplasm of colon, unspecified: Secondary | ICD-10-CM | POA: Diagnosis not present

## 2020-11-27 NOTE — Telephone Encounter (Signed)
   [facility] Pre-operative Risk Assessment    Patient Name: Thomas Knight  DOB: [birth year]  MRN: [id number]   HEARTCARE STAFF: - Please ensure there is not already an duplicate clearance open for this procedure. - Under Visit Info/Reason for Call, type in Other and utilize the format Clearance MM/DD/YY or Clearance TBD. Do not use dashes or single digits. - If request is for dental extraction, please clarify the # of teeth to be extracted. - If the patient is currently at the dentist's office, call Pre-Op APP to address. If the patient is not currently in the dentist office, please route to the Pre-Op pool  Request for surgical clearance:  1. What type of surgery is being performed? Laparascopic right hemicolectomy    2. When is this surgery scheduled? TBD   3. What type of clearance is required (medical clearance vs. Pharmacy clearance to hold med vs. Both)? Both  4. Are there any medications that need to be held prior to surgery and how long?Eliquis   5. Practice name and name of physician performing surgery? [facility]- Dr. Nadeen Landau   6. What is the office phone number? [phone]   7.   What is the office fax number? [phone] Attention Altamese Cabal  8.   Anesthesia type (None, local, MAC, general) ? general   Atheena Spano Tressa Busman 11/27/2020, 1:42 PM  _________________________________________________________________   (provider comments below)

## 2020-11-27 NOTE — Telephone Encounter (Signed)
Patient with diagnosis of afib on Eliquis for anticoagulation.    Procedure: Laparascopic right hemicolectomy    Date of procedure: TBD  CHA2DS2-VASc Score = 5  This indicates a 7.2% annual risk of stroke. The patient's score is based upon: CHF History: Yes HTN History: Yes Diabetes History: No Stroke History: No Vascular Disease History: Yes Age Score: 2 Gender Score: 0  CrCl 57mL/min using adjusted body weight due to obesity Platelet count 198K  Per office protocol, patient can hold Eliquis for 2 days prior to procedure.

## 2020-11-28 NOTE — Telephone Encounter (Signed)
   Name: Thomas Knight  DOB: 04-11-1946  MRN: [id number]   Primary Cardiologist: Berniece Salines, DO  Chart reviewed as part of pre-operative protocol coverage. Patient was contacted 11/28/2020 in reference to pre-operative risk assessment for pending surgery as outlined below.  Polo Lydon was last seen on 10/21/20 by Dr. Harriet Masson.  Since that day, [employer] has done well. Reports exercising regularly with exercise tolerance of >4METS.  Therefore, based on ACC/AHA guidelines, the patient would be at acceptable risk for the planned procedure without further cardiovascular testing.   Per pharmacy review he may hold Eliquis 2 days prior to planned procedure.   The patient was advised that if he develops new symptoms prior to surgery to contact our office to arrange for a follow-up visit, and he verbalized understanding.  I will route this recommendation to the requesting party via Epic fax function and remove from pre-op pool. Please call with questions.  Loel Dubonnet, NP 11/28/2020, 11:04 AM

## 2020-12-19 NOTE — Progress Notes (Signed)
Please place orders in epic pt. Is scheduled for a preop

## 2020-12-19 NOTE — Progress Notes (Addendum)
PCP - Lovette Cliche , MD [place] Cardiologist - LOV Berniece Salines, DO 10-21-20 clearance 11-28-20 epic  PPM/ICD -  Device Orders -  Rep Notified -   Chest x-ray -  EKG - 01-10-20 epiv Stress Test -  ECHO - 07-08-19 epic Cardiac Cath -  Coronary CT-02-25-20  Sleep Study -  CPAP -   Fasting Blood Sugar -  Checks Blood Sugar _____ times a day  Blood Thinner Instructions:Elequis hold 2 days Aspirin Instructions:  ERAS Protcol -yes 3 drinks PRE-SURGERY Ensure -   COVID TEST- 01-07-21  Activity--Walks 3 times a week without SOB and can walk a flight os stairs without SOB Anesthesia review: Afib, HTN  Patient denies shortness of breath, fever, cough and chest pain at PAT appointment   All instructions explained to the patient, with a verbal understanding of the material. Patient agrees to go over the instructions while at home for a better understanding. Patient also instructed to self quarantine after being tested for COVID-19. The opportunity to ask questions was provided.

## 2020-12-19 NOTE — Patient Instructions (Addendum)
DUE TO COVID-19 ONLY ONE VISITOR IS ALLOWED TO COME WITH YOU AND STAY IN THE WAITING ROOM ONLY DURING PRE OP AND PROCEDURE DAY OF SURGERY.   TWO VISITOR  MAY VISIT WITH YOU AFTER SURGERY IN YOUR PRIVATE ROOM DURING VISITING HOURS ONLY!  10a-8p  YOU NEED TO HAVE A COVID 19 TEST ON__7-13-22_____ @_______ , THIS TEST MUST BE DONE BEFORE SURGERY,    COVID TESTING SITE [address] [place] [place] [address],   IT IS ON THE RIGHT GOING OUT [address] APPROXIMATELY  2 MINUTES PAST [employer] ON THE RIGHT. ONCE YOUR COVID TEST IS COMPLETED,  PLEASE wear a mask in public               [facility]  12/19/2020   Your procedure is scheduled on: 01-09-21   Report to [facility] Main  Entrance   Report to admitting at      1000 AM     Call this number if you have problems the morning of surgery [phone]    Remember: Follow bowel prep per MD office follow a clear liquid diet the day of your bowel prep to prevent dehydration  DRINK 2 PRESURGERY ENSURE DRINKS THE NIGHT BEFORE SURGERY AT 1000 PM   AND 1 PRESURGERY DRINK THE DAY OF THE PROCEDURE 3 HOURS PRIOR TO SCHEDULED SURGERY.   NO SOLIDS AFTER MIDNIGHT THE DAY PRIOR TO THE SURGERY. Follow a clear liquid diet the day before surgery while you do your bowel prep  NOTHING BY MOUTH EXCEPT CLEAR LIQUIDS UNTIL THREE HOURS PRIOR TO SCHEDULED SURGERY.   PLEASE FINISH PRESURGERY ENSURE DRINK PER SURGEON ORDER 3 HOURS PRIOR TO SCHEDULED SURGERY TIME WHICH NEEDS TO BE   COMPLETED AT __0900 am_______.     CLEAR LIQUID DIET   Foods Allowed                                                                     Foods Excluded water Black Coffee and tea, regular and decaf                             liquids that you cannot  Plain Jell-O any favor except red or purple                                           see through such as: Fruit ices (not with fruit pulp)                                                  milk, soups,  orange juice  Iced Popsicles                                                           All solid  food                                 Cranberry, grape and apple juices Sports drinks like Gatorade Lightly seasoned clear broth or consume(fat free) Sugar, honey syrup                                              Sample Menu  Breakfast                                Lunch                                     Supper Cranberry juice                    Beef broth                            Chicken broth Jell-O                                     Grape juice                           Apple juice Coffee or tea                        Jell-O                                      Popsicle                                                Coffee or tea                        Coffee or tea  _____________________________________________________________________     BRUSH YOUR TEETH MORNING OF SURGERY AND RINSE YOUR MOUTH OUT, NO CHEWING GUM CANDY OR MINTS.     Take these medicines the morning of surgery with A SIP OF WATER: metoprolol  DO NOT TAKE ANY DIABETIC MEDICATIONS DAY OF YOUR SURGERY                               You may not have any metal on your body including hair pins and              piercings  Do not wear jewelry, lotions, powders or perfumes, deodorant                     Men may shave face and neck.   Do not bring valuables to the hospital. [place]  FOR VALUABLES.  Contacts, dentures or bridgework may not be worn into surgery.       Patients discharged the day of surgery will not be allowed to drive home. IF YOU ARE HAVING SURGERY AND GOING HOME THE SAME DAY, YOU MUST HAVE AN ADULT TO DRIVE YOU HOME AND BE WITH YOU FOR 24 HOURS. YOU MAY GO HOME BY TAXI OR UBER OR ORTHERWISE, BUT AN ADULT MUST ACCOMPANY YOU HOME AND STAY WITH YOU FOR 24 HOURS.  Name and phone number of your driver:  Special Instructions: N/A              Please read over the  following fact sheets you were given: _____________________________________________________________________             [facility] - Preparing for Surgery Before surgery, you can play an important role.  Because skin is not sterile, your skin needs to be as free of germs as possible.  You can reduce the number of germs on your skin by washing with CHG (chlorahexidine gluconate) soap before surgery.  CHG is an antiseptic cleaner which kills germs and bonds with the skin to continue killing germs even after washing. Please DO NOT use if you have an allergy to CHG or antibacterial soaps.  If your skin becomes reddened/irritated stop using the CHG and inform your nurse when you arrive at Short Stay. Do not shave (including legs and underarms) for at least 48 hours prior to the first CHG shower.  You may shave your face/neck. Please follow these instructions carefully:  1.  Shower with CHG Soap the night before surgery and the  morning of Surgery.  2.  If you choose to wash your hair, wash your hair first as usual with your  normal  shampoo.  3.  After you shampoo, rinse your hair and body thoroughly to remove the  shampoo.                           4.  Use CHG as you would any other liquid soap.  You can apply chg directly  to the skin and wash                       Gently with a scrungie or clean washcloth.  5.  Apply the CHG Soap to your body ONLY FROM THE NECK DOWN.   Do not use on face/ open                           Wound or open sores. Avoid contact with eyes, ears mouth and genitals (private parts).                       Wash face,  Genitals (private parts) with your normal soap.             6.  Wash thoroughly, paying special attention to the area where your surgery  will be performed.  7.  Thoroughly rinse your body with warm water from the neck down.  8.  DO NOT shower/wash with your normal soap after using and rinsing off  the CHG Soap.                9.  Pat yourself dry with a clean  towel.            10.  Wear clean pajamas.  11.  Place clean sheets on your bed the night of your first shower and do not  sleep with pets. Day of Surgery : Do not apply any lotions/deodorants the morning of surgery.  Please wear clean clothes to the hospital/surgery center.  FAILURE TO FOLLOW THESE INSTRUCTIONS MAY RESULT IN THE CANCELLATION OF YOUR SURGERY PATIENT SIGNATURE_________________________________  NURSE SIGNATURE__________________________________  _______  Adam Phenix  An incentive spirometer is a tool that can help keep your lungs clear and active. This tool measures how well you are filling your lungs with each breath. Taking long deep breaths may help reverse or decrease the chance of developing breathing (pulmonary) problems (especially infection) following: A long period of time when you are unable to move or be active. BEFORE THE PROCEDURE  If the spirometer includes an indicator to show your best effort, your nurse or respiratory therapist will set it to a desired goal. If possible, sit up straight or lean slightly forward. Try not to slouch. Hold the incentive spirometer in an upright position. INSTRUCTIONS FOR USE  Sit on the edge of your bed if possible, or sit up as far as you can in bed or on a chair. Hold the incentive spirometer in an upright position. Breathe out normally. Place the mouthpiece in your mouth and seal your lips tightly around it. Breathe in slowly and as deeply as possible, raising the piston or the ball toward the top of the column. Hold your breath for 3-5 seconds or for as long as possible. Allow the piston or ball to fall to the bottom of the column. Remove the mouthpiece from your mouth and breathe out normally. Rest for a few seconds and repeat Steps 1 through 7 at least 10 times every 1-2 hours when you are awake. Take your time and take a few normal breaths between deep breaths. The spirometer may include an indicator  to show your best effort. Use the indicator as a goal to work toward during each repetition. After each set of 10 deep breaths, practice coughing to be sure your lungs are clear. If you have an incision (the cut made at the time of surgery), support your incision when coughing by placing a pillow or rolled up towels firmly against it. Once you are able to get out of bed, walk around indoors and cough well. You may stop using the incentive spirometer when instructed by your caregiver.  RISKS AND COMPLICATIONS Take your time so you do not get dizzy or light-headed. If you are in pain, you may need to take or ask for pain medication before doing incentive spirometry. It is harder to take a deep breath if you are having pain. AFTER USE Rest and breathe slowly and easily. It can be helpful to keep track of a log of your progress. Your caregiver can provide you with a simple table to help with this. If you are using the spirometer at home, follow these instructions: [place] IF:  You are having difficultly using the spirometer. You have trouble using the spirometer as often as instructed. Your pain medication is not giving enough relief while using the spirometer. You develop fever of 100.5 F (38.1 C) or higher. SEEK IMMEDIATE MEDICAL CARE IF:  You cough up bloody sputum that had not been present before. You develop fever of 102 F (38.9 C) or greater. You develop worsening pain at or near the incision site. MAKE SURE YOU:  Understand these instructions. Will watch your condition. Will get help  right away if you are not doing well or get worse. Document Released: 10/25/2006 Document Revised: 09/06/2011 Document Reviewed: 12/26/2006 [facility] Patient Information 2014 [employer], [place].   ________________________________________________________________________ _________________________________________________________________

## 2020-12-20 NOTE — Addendum Note (Signed)
Encounter addended by: Annie Paras on: 12/20/2020 2:58 PM  Actions taken: Letter saved

## 2020-12-22 ENCOUNTER — Encounter (HOSPITAL_COMMUNITY): Admission: RE | Admit: 2020-12-22 | Payer: Medicare HMO | Source: Ambulatory Visit

## 2020-12-22 ENCOUNTER — Ambulatory Visit: Payer: Self-pay | Admitting: Surgery

## 2020-12-22 NOTE — H&P (Signed)
CC: Referred by Dr. Benson Norway - newly diagnosed transverse colon cancer  HPI: Mr. Schrecengost is a very pleasant 75yoM with hx of HTN, HLD, obesity, afib (on Eliquis, Dr. Harriet Masson in [place]) - here today following his first colonoscopy at Dr. Benson Norway.  He is found to have a mass in his transverse colon that was biopsied.  We do not have a copy of the report at this time.  By report, this showed "colon cancer."  He subsequently had staging CT chest/abdomen/pelvis that demonstrates an apple core mass in his transverse colon appears to be in the proximal half of it measuring 4.6 cm in size.  No local regional adenopathy apparent on the CAT scan.  No findings of metastatic disease.  He denies any symptoms related to this including history of bleeding, abdominal pain, bloating, nausea or vomiting.  He reports brown stool.  He is here today with his wife; his daughter Starleen Blue is present via speaker phone  PMH: HTN, HLD, obesity, afib (on Eliquis, Dr. Harriet Masson in [place])  [place]: Primary ventral hernia repair with Dr.Linninger in [place] - with mesh ~2010;    FHx: Denies FHx of colorectal, breast, endometrial, ovarian or cervical cancer  Social: Denies use of tobacco/EtOH/drugs.  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  ROS: A comprehensive 10 system review of systems was completed with the patient and pertinent findings as noted above.  The patient is a 75 year old male.   Past Surgical History Janeann Forehand, CNA; 11/27/2020 9:17 AM) Colon Polyp Removal - Colonoscopy   Foot Surgery   Left. Oral Surgery   Ventral / Umbilical Hernia Surgery   Right.  Diagnostic Studies History Janeann Forehand, CNA; 11/27/2020 9:17 AM) Colonoscopy   within last year  Allergies Janeann Forehand, CNA; 11/27/2020 9:17 AM) No Known Drug Allergies   [11/27/2020]: Allergies Reconciled    Medication History Janeann Forehand, CNA; 11/27/2020  9:18 AM) Eliquis  (5MG Tablet, Oral) Active. Furosemide  (40MG Tablet, Oral) Active. Metoprolol Tartrate  (25MG Tablet, Oral) Active. Nitroglycerin  (0.4MG Tab Sublingual, Sublingual) Active. Potassium Chloride Crys ER  (20MEQ Tablet ER, Oral) Active. Sildenafil Citrate  (20MG Tablet, Oral) Active. Suprep Bowel Prep Kit  (17.5-3.13-1.6GM/177ML Solution, Oral) Active. Medications Reconciled   Social History Janeann Forehand, CNA; 11/27/2020 9:17 AM) Alcohol use   Occasional alcohol use. Caffeine use   Carbonated beverages, Coffee, Tea. No drug use   Tobacco use   Never smoker.  Family History Janeann Forehand, CNA; 11/27/2020 9:17 AM) Colon Polyps   Sister. Depression   Sister. Heart Disease   Father, Mother. Hypertension   Father, Mother, Sister. Migraine Headache   Sister. Respiratory Condition   Father.  Other Problems Janeann Forehand, CNA; 11/27/2020 9:17 AM) Atrial Fibrillation   Chest pain   Colon Cancer   High blood pressure   Hypercholesterolemia   Melanoma   Thyroid Disease   Ventral Hernia Repair      Review of Systems Adriana Reams Alston CNA; 11/27/2020 9:17 AM) General Present- Fatigue. Not Present- Appetite Loss, Chills, Fever, Night Sweats, Weight Gain and Weight Loss. Skin Not Present- Change in Wart/Mole, Dryness, Hives, Jaundice, New Lesions, Non-Healing Wounds, Rash and Ulcer. HEENT Present- Ringing in the Ears and Wears glasses/contact lenses. Not Present- Earache, Hearing Loss, Hoarseness, Nose Bleed, Oral Ulcers, Seasonal Allergies, Sinus Pain, Sore Throat, Visual Disturbances and Yellow Eyes. Respiratory Not Present- Bloody sputum, Chronic  Cough, Difficulty Breathing, Snoring and Wheezing. Breast Not Present- Breast Mass, Breast Pain, Nipple Discharge and Skin Changes. Cardiovascular Present- Shortness of Breath. Not Present- Chest Pain, Difficulty Breathing Lying Down, Leg Cramps, Palpitations, Rapid Heart Rate and Swelling of Extremities. Gastrointestinal Not  Present- Abdominal Pain, Bloating, Bloody Stool, Change in Bowel Habits, Chronic diarrhea, Constipation, Difficulty Swallowing, Excessive gas, Gets full quickly at meals, Hemorrhoids, Indigestion, Nausea, Rectal Pain and Vomiting. Male Genitourinary Present- Frequency. Not Present- Blood in Urine, Change in Urinary Stream, Impotence, Nocturia, Painful Urination, Urgency and Urine Leakage. Musculoskeletal Not Present- Back Pain, Joint Pain, Joint Stiffness, Muscle Pain, Muscle Weakness and Swelling of Extremities. Neurological Not Present- Decreased Memory, Fainting, Headaches, Numbness, Seizures, Tingling, Tremor, Trouble walking and Weakness. Psychiatric Not Present- Anxiety, Bipolar, Change in Sleep Pattern, Depression, Fearful and Frequent crying. Endocrine Not Present- Cold Intolerance, Excessive Hunger, Hair Changes, Heat Intolerance, Hot flashes and New Diabetes. Hematology Present- Blood Thinners. Not Present- Easy Bruising, Excessive bleeding, Gland problems, HIV and Persistent Infections.  Vitals (Donyelle Alston CNA; 11/27/2020 9:18 AM) 11/27/2020 9:18 AM Weight: 261.25 lb   Height: 71 in  Body Surface Area: 2.36 m   Body Mass Index: 36.44 kg/m   Temp.: 98 F    Pulse: 79 (Regular)    P.OX: 94% (Room air) BP: 136/80(Sitting, Left Arm, Standard)       Physical Exam Harrell Gave M. Michol Emory MD; 11/27/2020 9:56 AM) The physical exam findings are as follows: Note:  Constitutional: No acute distress; conversant; no deformities; wearing mask Eyes: Moist conjunctiva; no lid lag; anicteric sclerae; pupils equal and round Neck: Trachea midline; no palpable thyromegaly Lungs: Normal respiratory effort; no tactile fremitus CV: irreg rate/rhythm; no palpable thrill; no pitting edema GI: Abdomen obese soft, nontender, nondistended; no palpable hepatosplenomegaly MSK: Normal gait; no clubbing/cyanosis Psychiatric: Appropriate affect; alert and oriented 3 Lymphatic: No palpable cervical or  axillary lymphadenopathy    Assessment & Plan Harrell Gave M. Rai Sinagra MD; 11/27/2020 10:29 AM) COLON CANCER (C18.9) Story: Mr. Werntz is a very pleasant 43yoM with newly diagnosed mass in transverse colon; hx of HTN, HLD, obesity, afib (on Eliquis, Dr. Harriet Masson in [place]) CT CAP 11/06/20 - no evident metastatic disease; mass evident in mid transverse colon Impression: -Cardiac clearance and plan for perioperative Xarelto to be held (Dr. Harriet Masson) -Colonoscopy report/path requested from Dr. Benson Norway; mass is evident on his CT scan in the transverse colon; no evident metastatic disease -The anatomy and physiology of the GI tract was discussed at length with the patient. The pathophysiology of colon cancer was discussed at length with associated pictures. -We have reviewed likely laparoscopic right hemicolectomy based on mass location -The planned procedure, material risks (including, but not limited to, pain, bleeding, infection, scarring, need for blood transfusion, damage to surrounding structures- blood vessels/nerves/viscus/organs, damage to ureter, urine leak, leak from anastomosis, need for additional procedures, worsening of pre-existing medical conditions, need for stoma which may be permanent, hernia, recurrence, DVT/PE, pneumonia, heart attack, stroke, death) benefits and alternatives to surgery were discussed at length. I noted a good probability that the procedure would help improve their symptoms. The patient's, his wife's and daughter's questions were answered to their satisfaction, they voiced understanding and elected to proceed with surgery. Additionally, we discussed typical postoperative expectations and the recovery process.   Ileana Roup, MD

## 2020-12-23 ENCOUNTER — Encounter (INDEPENDENT_AMBULATORY_CARE_PROVIDER_SITE_OTHER): Payer: Self-pay

## 2020-12-23 ENCOUNTER — Encounter (HOSPITAL_COMMUNITY): Payer: Self-pay

## 2020-12-23 ENCOUNTER — Encounter (HOSPITAL_COMMUNITY)
Admission: RE | Admit: 2020-12-23 | Discharge: 2020-12-23 | Disposition: A | Discharge status: Home / Self Care | Payer: Medicare HMO | Source: Ambulatory Visit | Attending: Surgery | Admitting: Surgery

## 2020-12-23 ENCOUNTER — Other Ambulatory Visit: Payer: Self-pay

## 2020-12-23 DIAGNOSIS — Z01812 Encounter for preprocedural laboratory examination: Secondary | ICD-10-CM | POA: Diagnosis not present

## 2020-12-23 HISTORY — DX: Cardiac arrhythmia, unspecified: I49.9

## 2020-12-23 LAB — CBC WITH DIFFERENTIAL/PLATELET
Abs Immature Granulocytes: 0.01 10*3/uL (ref 0.00–0.07)
Basophils Absolute: 0.1 10*3/uL (ref 0.0–0.1)
Basophils Relative: 1 %
Eosinophils Absolute: 0.1 10*3/uL (ref 0.0–0.5)
Eosinophils Relative: 1 %
HCT: 41.1 % (ref 39.0–52.0)
Hemoglobin: 12.8 g/dL — ABNORMAL LOW (ref 13.0–17.0)
Immature Granulocytes: 0 %
Lymphocytes Relative: 22 %
Lymphs Abs: 1.4 10*3/uL (ref 0.7–4.0)
MCH: 27.9 pg (ref 26.0–34.0)
MCHC: 31.1 g/dL (ref 30.0–36.0)
MCV: 89.7 fL (ref 80.0–100.0)
Monocytes Absolute: 0.8 10*3/uL (ref 0.1–1.0)
Monocytes Relative: 12 %
Neutro Abs: 3.9 10*3/uL (ref 1.7–7.7)
Neutrophils Relative %: 64 %
Platelets: 252 10*3/uL (ref 150–400)
RBC: 4.58 MIL/uL (ref 4.22–5.81)
RDW: 16.2 % — ABNORMAL HIGH (ref 11.5–15.5)
WBC: 6.2 10*3/uL (ref 4.0–10.5)
nRBC: 0 % (ref 0.0–0.2)

## 2020-12-23 LAB — COMPREHENSIVE METABOLIC PANEL
ALT: 23 U/L (ref 0–44)
AST: 23 U/L (ref 15–41)
Albumin: 4 g/dL (ref 3.5–5.0)
Alkaline Phosphatase: 60 U/L (ref 38–126)
Anion gap: 5 (ref 5–15)
BUN: 15 mg/dL (ref 8–23)
CO2: 27 mmol/L (ref 22–32)
Calcium: 9.1 mg/dL (ref 8.9–10.3)
Chloride: 109 mmol/L (ref 98–111)
Creatinine, Ser: 0.97 mg/dL (ref 0.61–1.24)
GFR, Estimated: >60 mL/min (ref >60)
Glucose, Bld: 107 mg/dL — ABNORMAL HIGH (ref 70–99)
Potassium: 4.3 mmol/L (ref 3.5–5.1)
Sodium: 141 mmol/L (ref 135–145)
Total Bilirubin: 0.6 mg/dL (ref 0.3–1.2)
Total Protein: 7.1 g/dL (ref 6.5–8.1)

## 2020-12-23 LAB — APTT: aPTT: 33 seconds (ref 24–36)

## 2020-12-23 LAB — PROTIME-INR
INR: 1 (ref 0.8–1.2)
Prothrombin Time: 13.3 seconds (ref 11.4–15.2)

## 2020-12-24 LAB — HEMOGLOBIN A1C
Hgb A1c MFr Bld: 6.1 % — ABNORMAL HIGH (ref 4.8–5.6)
Mean Plasma Glucose: 128 mg/dL

## 2021-01-07 ENCOUNTER — Other Ambulatory Visit (HOSPITAL_COMMUNITY)
Admission: RE | Admit: 2021-01-07 | Discharge: 2021-01-07 | Disposition: A | Discharge status: Home / Self Care | Payer: Medicare HMO | Source: Ambulatory Visit | Attending: Nurse Practitioner | Admitting: Nurse Practitioner

## 2021-01-07 DIAGNOSIS — Z01812 Encounter for preprocedural laboratory examination: Secondary | ICD-10-CM | POA: Insufficient documentation

## 2021-01-07 DIAGNOSIS — Z20822 Contact with and (suspected) exposure to covid-19: Secondary | ICD-10-CM | POA: Insufficient documentation

## 2021-01-07 LAB — SARS CORONAVIRUS 2 (TAT 6-24 HRS): SARS Coronavirus 2: NEGATIVE

## 2021-01-08 MED ORDER — SODIUM CHLORIDE 0.9 % IV SOLN
2.0000 g | INTRAVENOUS | Status: AC
  Administered 2021-01-09: 2 g via INTRAVENOUS
  Filled 2021-01-08: qty 2

## 2021-01-08 MED ORDER — BUPIVACAINE LIPOSOME 1.3 % IJ SUSP
20.0000 mL | Freq: Once | INTRAMUSCULAR | Status: DC
  Filled 2021-01-08: qty 20

## 2021-01-09 ENCOUNTER — Other Ambulatory Visit: Payer: Self-pay

## 2021-01-09 ENCOUNTER — Inpatient Hospital Stay (HOSPITAL_COMMUNITY)
Admission: RE | Admit: 2021-01-09 | Discharge: 2021-01-13 | DRG: 330 | Disposition: A | Discharge status: Home / Self Care | Payer: Medicare HMO | Attending: Surgery | Admitting: Surgery

## 2021-01-09 ENCOUNTER — Encounter (HOSPITAL_COMMUNITY)
Admission: RE | Disposition: A | Discharge status: Home / Self Care | Payer: Self-pay | Source: Home / Self Care | Attending: Surgery

## 2021-01-09 ENCOUNTER — Inpatient Hospital Stay (HOSPITAL_COMMUNITY): Payer: Medicare HMO | Admitting: Physician Assistant

## 2021-01-09 ENCOUNTER — Inpatient Hospital Stay (HOSPITAL_COMMUNITY): Payer: Medicare HMO | Admitting: Certified Registered"

## 2021-01-09 ENCOUNTER — Encounter (HOSPITAL_COMMUNITY): Payer: Self-pay | Admitting: Surgery

## 2021-01-09 DIAGNOSIS — G4733 Obstructive sleep apnea (adult) (pediatric): Secondary | ICD-10-CM | POA: Diagnosis not present

## 2021-01-09 DIAGNOSIS — Z20822 Contact with and (suspected) exposure to covid-19: Secondary | ICD-10-CM | POA: Diagnosis present

## 2021-01-09 DIAGNOSIS — C182 Malignant neoplasm of ascending colon: Secondary | ICD-10-CM | POA: Diagnosis present

## 2021-01-09 DIAGNOSIS — E782 Mixed hyperlipidemia: Secondary | ICD-10-CM | POA: Diagnosis not present

## 2021-01-09 DIAGNOSIS — C189 Malignant neoplasm of colon, unspecified: Secondary | ICD-10-CM | POA: Diagnosis not present

## 2021-01-09 DIAGNOSIS — I4819 Other persistent atrial fibrillation: Secondary | ICD-10-CM | POA: Diagnosis not present

## 2021-01-09 DIAGNOSIS — I440 Atrioventricular block, first degree: Secondary | ICD-10-CM | POA: Diagnosis present

## 2021-01-09 DIAGNOSIS — K660 Peritoneal adhesions (postprocedural) (postinfection): Secondary | ICD-10-CM | POA: Diagnosis not present

## 2021-01-09 DIAGNOSIS — Z7901 Long term (current) use of anticoagulants: Secondary | ICD-10-CM

## 2021-01-09 DIAGNOSIS — Z85828 Personal history of other malignant neoplasm of skin: Secondary | ICD-10-CM

## 2021-01-09 DIAGNOSIS — R053 Chronic cough: Secondary | ICD-10-CM | POA: Diagnosis present

## 2021-01-09 DIAGNOSIS — Z6835 Body mass index (BMI) 35.0-35.9, adult: Secondary | ICD-10-CM | POA: Diagnosis not present

## 2021-01-09 DIAGNOSIS — D124 Benign neoplasm of descending colon: Secondary | ICD-10-CM | POA: Diagnosis present

## 2021-01-09 DIAGNOSIS — C184 Malignant neoplasm of transverse colon: Secondary | ICD-10-CM | POA: Diagnosis not present

## 2021-01-09 DIAGNOSIS — Z9989 Dependence on other enabling machines and devices: Secondary | ICD-10-CM | POA: Diagnosis not present

## 2021-01-09 DIAGNOSIS — Z801 Family history of malignant neoplasm of trachea, bronchus and lung: Secondary | ICD-10-CM | POA: Diagnosis not present

## 2021-01-09 DIAGNOSIS — E669 Obesity, unspecified: Secondary | ICD-10-CM | POA: Diagnosis present

## 2021-01-09 DIAGNOSIS — I10 Essential (primary) hypertension: Secondary | ICD-10-CM | POA: Diagnosis present

## 2021-01-09 DIAGNOSIS — I2510 Atherosclerotic heart disease of native coronary artery without angina pectoris: Secondary | ICD-10-CM | POA: Diagnosis present

## 2021-01-09 HISTORY — PX: XI ROBOTIC ASSISTED LOWER ANTERIOR RESECTION: SHX6558

## 2021-01-09 LAB — TYPE AND SCREEN
ABO/RH(D): O POS
Antibody Screen: NEGATIVE

## 2021-01-09 LAB — ABO/RH: ABO/RH(D): O POS

## 2021-01-09 SURGERY — RESECTION, RECTUM, LOW ANTERIOR, ROBOT-ASSISTED
Anesthesia: General

## 2021-01-09 MED ORDER — SUGAMMADEX SODIUM 200 MG/2ML IV SOLN
INTRAVENOUS | Status: DC | PRN
  Administered 2021-01-09: 250 mg via INTRAVENOUS

## 2021-01-09 MED ORDER — HYDROMORPHONE HCL 1 MG/ML IJ SOLN
INTRAMUSCULAR | Status: AC
  Administered 2021-01-09: 0.5 mg via INTRAVENOUS
  Filled 2021-01-09: qty 1

## 2021-01-09 MED ORDER — ENSURE PRE-SURGERY PO LIQD
296.0000 mL | Freq: Once | ORAL | Status: DC
  Filled 2021-01-09: qty 296

## 2021-01-09 MED ORDER — HYDROMORPHONE HCL 1 MG/ML IJ SOLN
0.2500 mg | INTRAMUSCULAR | Status: DC | PRN
  Administered 2021-01-09 (×2): 0.5 mg via INTRAVENOUS

## 2021-01-09 MED ORDER — ROCURONIUM BROMIDE 10 MG/ML (PF) SYRINGE
PREFILLED_SYRINGE | INTRAVENOUS | Status: AC
  Filled 2021-01-09: qty 10

## 2021-01-09 MED ORDER — ENSURE SURGERY PO LIQD
237.0000 mL | Freq: Two times a day (BID) | ORAL | Status: DC
  Administered 2021-01-10 – 2021-01-12 (×3): 237 mL via ORAL

## 2021-01-09 MED ORDER — DIPHENHYDRAMINE HCL 50 MG/ML IJ SOLN: 12.5000 mg | Freq: Four times a day (QID) | INTRAMUSCULAR | Status: DC | PRN

## 2021-01-09 MED ORDER — HYDRALAZINE HCL 20 MG/ML IJ SOLN
10.0000 mg | INTRAMUSCULAR | Status: DC | PRN
  Administered 2021-01-09: 10 mg via INTRAVENOUS
  Filled 2021-01-09: qty 1

## 2021-01-09 MED ORDER — BUPIVACAINE LIPOSOME 1.3 % IJ SUSP
INTRAMUSCULAR | Status: DC | PRN
  Administered 2021-01-09: 20 mL

## 2021-01-09 MED ORDER — LIDOCAINE 2% (20 MG/ML) 5 ML SYRINGE
INTRAMUSCULAR | Status: AC
  Filled 2021-01-09: qty 5

## 2021-01-09 MED ORDER — DIPHENHYDRAMINE HCL 12.5 MG/5ML PO ELIX: 12.5000 mg | ORAL_SOLUTION | Freq: Four times a day (QID) | ORAL | Status: DC | PRN

## 2021-01-09 MED ORDER — POLYETHYLENE GLYCOL 3350 17 GM/SCOOP PO POWD: 1.0000 | Freq: Once | ORAL | Status: DC

## 2021-01-09 MED ORDER — FERROUS SULFATE 325 (65 FE) MG PO TABS
325.0000 mg | ORAL_TABLET | ORAL | Status: DC
  Administered 2021-01-12: 325 mg via ORAL
  Filled 2021-01-09 (×2): qty 1

## 2021-01-09 MED ORDER — METRONIDAZOLE 500 MG PO TABS: 1000.0000 mg | ORAL_TABLET | ORAL | Status: DC

## 2021-01-09 MED ORDER — ALVIMOPAN 12 MG PO CAPS
12.0000 mg | ORAL_CAPSULE | Freq: Two times a day (BID) | ORAL | Status: DC
  Administered 2021-01-10: 12 mg via ORAL
  Filled 2021-01-09: qty 1

## 2021-01-09 MED ORDER — TRAMADOL HCL 50 MG PO TABS
50.0000 mg | ORAL_TABLET | Freq: Four times a day (QID) | ORAL | 0 refills | Status: AC | PRN
  Filled 2021-01-09: qty 15, 4d supply, fill #0

## 2021-01-09 MED ORDER — FENTANYL CITRATE (PF) 100 MCG/2ML IJ SOLN
INTRAMUSCULAR | Status: AC
  Filled 2021-01-09: qty 2

## 2021-01-09 MED ORDER — MIDAZOLAM HCL 2 MG/2ML IJ SOLN
INTRAMUSCULAR | Status: AC
  Filled 2021-01-09: qty 2

## 2021-01-09 MED ORDER — ROCURONIUM BROMIDE 10 MG/ML (PF) SYRINGE
PREFILLED_SYRINGE | INTRAVENOUS | Status: DC | PRN
  Administered 2021-01-09: 70 mg via INTRAVENOUS
  Administered 2021-01-09: 30 mg via INTRAVENOUS
  Administered 2021-01-09: 10 mg via INTRAVENOUS

## 2021-01-09 MED ORDER — PROPOFOL 10 MG/ML IV BOLUS
INTRAVENOUS | Status: DC | PRN
  Administered 2021-01-09: 150 mg via INTRAVENOUS

## 2021-01-09 MED ORDER — ASCORBIC ACID 500 MG PO TABS
500.0000 mg | ORAL_TABLET | Freq: Every day | ORAL | Status: DC
  Administered 2021-01-10 – 2021-01-13 (×4): 500 mg via ORAL
  Filled 2021-01-09 (×4): qty 1

## 2021-01-09 MED ORDER — FENTANYL CITRATE (PF) 100 MCG/2ML IJ SOLN
INTRAMUSCULAR | Status: DC | PRN
  Administered 2021-01-09 (×3): 50 ug via INTRAVENOUS
  Administered 2021-01-09: 100 ug via INTRAVENOUS

## 2021-01-09 MED ORDER — ONDANSETRON HCL 4 MG/2ML IJ SOLN
INTRAMUSCULAR | Status: AC
  Filled 2021-01-09: qty 2

## 2021-01-09 MED ORDER — ALVIMOPAN 12 MG PO CAPS
12.0000 mg | ORAL_CAPSULE | ORAL | Status: AC
  Administered 2021-01-09: 12 mg via ORAL
  Filled 2021-01-09: qty 1

## 2021-01-09 MED ORDER — FUROSEMIDE 20 MG PO TABS: 20.0000 mg | ORAL_TABLET | Freq: Every day | ORAL | Status: DC | PRN

## 2021-01-09 MED ORDER — SIMETHICONE 80 MG PO CHEW: 40.0000 mg | CHEWABLE_TABLET | Freq: Four times a day (QID) | ORAL | Status: DC | PRN

## 2021-01-09 MED ORDER — CHLORHEXIDINE GLUCONATE CLOTH 2 % EX PADS: 6.0000 | MEDICATED_PAD | Freq: Once | CUTANEOUS | Status: DC

## 2021-01-09 MED ORDER — NEOMYCIN SULFATE 500 MG PO TABS: 1000.0000 mg | ORAL_TABLET | ORAL | Status: DC

## 2021-01-09 MED ORDER — LACTATED RINGERS IV SOLN: INTRAVENOUS | Status: DC

## 2021-01-09 MED ORDER — MIDAZOLAM HCL 2 MG/2ML IJ SOLN
INTRAMUSCULAR | Status: DC | PRN
  Administered 2021-01-09: 2 mg via INTRAVENOUS

## 2021-01-09 MED ORDER — ACETAMINOPHEN 500 MG PO TABS
1000.0000 mg | ORAL_TABLET | ORAL | Status: AC
  Administered 2021-01-09: 1000 mg via ORAL
  Filled 2021-01-09: qty 2

## 2021-01-09 MED ORDER — LIDOCAINE 2% (20 MG/ML) 5 ML SYRINGE
INTRAMUSCULAR | Status: DC | PRN
  Administered 2021-01-09: 1.5 mg/kg/h via INTRAVENOUS

## 2021-01-09 MED ORDER — DEXAMETHASONE SODIUM PHOSPHATE 10 MG/ML IJ SOLN
INTRAMUSCULAR | Status: DC | PRN
  Administered 2021-01-09: 8 mg via INTRAVENOUS

## 2021-01-09 MED ORDER — VITAMIN D3 25 MCG (1000 UNIT) PO TABS
1000.0000 [IU] | ORAL_TABLET | Freq: Every day | ORAL | Status: DC
  Administered 2021-01-10 – 2021-01-13 (×4): 1000 [IU] via ORAL
  Filled 2021-01-09 (×4): qty 1

## 2021-01-09 MED ORDER — ONDANSETRON HCL 4 MG PO TABS: 4.0000 mg | ORAL_TABLET | Freq: Four times a day (QID) | ORAL | Status: DC | PRN

## 2021-01-09 MED ORDER — ONDANSETRON HCL 4 MG/2ML IJ SOLN
4.0000 mg | Freq: Once | INTRAMUSCULAR | Status: AC | PRN
  Administered 2021-01-09: 4 mg via INTRAVENOUS

## 2021-01-09 MED ORDER — 0.9 % SODIUM CHLORIDE (POUR BTL) OPTIME
TOPICAL | Status: DC | PRN
  Administered 2021-01-09: 2000 mL

## 2021-01-09 MED ORDER — HEPARIN SODIUM (PORCINE) 5000 UNIT/ML IJ SOLN
5000.0000 [IU] | Freq: Three times a day (TID) | INTRAMUSCULAR | Status: DC
  Administered 2021-01-10 – 2021-01-11 (×5): 5000 [IU] via SUBCUTANEOUS
  Filled 2021-01-09 (×5): qty 1

## 2021-01-09 MED ORDER — ONDANSETRON HCL 4 MG/2ML IJ SOLN: 4.0000 mg | Freq: Four times a day (QID) | INTRAMUSCULAR | Status: DC | PRN

## 2021-01-09 MED ORDER — BUPIVACAINE-EPINEPHRINE (PF) 0.25% -1:200000 IJ SOLN
INTRAMUSCULAR | Status: AC
  Filled 2021-01-09: qty 30

## 2021-01-09 MED ORDER — TRAMADOL HCL 50 MG PO TABS: 50.0000 mg | ORAL_TABLET | Freq: Four times a day (QID) | ORAL | Status: DC | PRN

## 2021-01-09 MED ORDER — SUGAMMADEX SODIUM 500 MG/5ML IV SOLN
INTRAVENOUS | Status: AC
  Filled 2021-01-09: qty 5

## 2021-01-09 MED ORDER — BISACODYL 5 MG PO TBEC: 20.0000 mg | DELAYED_RELEASE_TABLET | Freq: Once | ORAL | Status: DC

## 2021-01-09 MED ORDER — ACETAMINOPHEN 500 MG PO TABS
1000.0000 mg | ORAL_TABLET | Freq: Four times a day (QID) | ORAL | Status: DC
  Administered 2021-01-09 – 2021-01-13 (×13): 1000 mg via ORAL
  Filled 2021-01-09 (×15): qty 2

## 2021-01-09 MED ORDER — HEPARIN SODIUM (PORCINE) 5000 UNIT/ML IJ SOLN
5000.0000 [IU] | Freq: Once | INTRAMUSCULAR | Status: AC
  Administered 2021-01-09: 5000 [IU] via SUBCUTANEOUS
  Filled 2021-01-09: qty 1

## 2021-01-09 MED ORDER — INDOCYANINE GREEN 25 MG IV SOLR
INTRAVENOUS | Status: DC | PRN
  Administered 2021-01-09: 7.5 mg via INTRAVENOUS

## 2021-01-09 MED ORDER — ONDANSETRON HCL 4 MG/2ML IJ SOLN
INTRAMUSCULAR | Status: DC | PRN
  Administered 2021-01-09: 4 mg via INTRAVENOUS

## 2021-01-09 MED ORDER — LACTATED RINGERS IV SOLN: INTRAVENOUS | Status: DC | PRN

## 2021-01-09 MED ORDER — LIDOCAINE 2% (20 MG/ML) 5 ML SYRINGE
INTRAMUSCULAR | Status: DC | PRN
  Administered 2021-01-09: 80 mg via INTRAVENOUS

## 2021-01-09 MED ORDER — CHLORHEXIDINE GLUCONATE 0.12 % MT SOLN
15.0000 mL | Freq: Once | OROMUCOSAL | Status: AC
  Administered 2021-01-09: 15 mL via OROMUCOSAL

## 2021-01-09 MED ORDER — ALUM & MAG HYDROXIDE-SIMETH 200-200-20 MG/5ML PO SUSP: 30.0000 mL | Freq: Four times a day (QID) | ORAL | Status: DC | PRN

## 2021-01-09 MED ORDER — VITAMIN B-12 1000 MCG PO TABS
1000.0000 ug | ORAL_TABLET | Freq: Every day | ORAL | Status: DC
  Administered 2021-01-10 – 2021-01-13 (×4): 1000 ug via ORAL
  Filled 2021-01-09 (×4): qty 1

## 2021-01-09 MED ORDER — METOPROLOL TARTRATE 25 MG PO TABS
25.0000 mg | ORAL_TABLET | Freq: Two times a day (BID) | ORAL | Status: DC
  Administered 2021-01-09 – 2021-01-13 (×8): 25 mg via ORAL
  Filled 2021-01-09 (×8): qty 1

## 2021-01-09 MED ORDER — IBUPROFEN 400 MG PO TABS
600.0000 mg | ORAL_TABLET | Freq: Four times a day (QID) | ORAL | Status: DC | PRN
  Administered 2021-01-09 – 2021-01-11 (×2): 600 mg via ORAL
  Filled 2021-01-09 (×2): qty 1

## 2021-01-09 MED ORDER — PROPOFOL 10 MG/ML IV BOLUS
INTRAVENOUS | Status: AC
  Filled 2021-01-09: qty 20

## 2021-01-09 MED ORDER — DEXAMETHASONE SODIUM PHOSPHATE 10 MG/ML IJ SOLN
INTRAMUSCULAR | Status: AC
  Filled 2021-01-09: qty 1

## 2021-01-09 MED ORDER — ORAL CARE MOUTH RINSE: 15.0000 mL | Freq: Once | OROMUCOSAL | Status: AC

## 2021-01-09 MED ORDER — HYDROMORPHONE HCL 1 MG/ML IJ SOLN: 0.5000 mg | INTRAMUSCULAR | Status: DC | PRN

## 2021-01-09 MED ORDER — ENSURE PRE-SURGERY PO LIQD
592.0000 mL | Freq: Once | ORAL | Status: DC
  Filled 2021-01-09: qty 592

## 2021-01-09 MED ORDER — BUPIVACAINE-EPINEPHRINE (PF) 0.25% -1:200000 IJ SOLN
INTRAMUSCULAR | Status: DC | PRN
  Administered 2021-01-09: 30 mL

## 2021-01-09 SURGICAL SUPPLY — 114 items
APPLIER CLIP 5 13 M/L LIGAMAX5 (MISCELLANEOUS)
APPLIER CLIP ROT 10 11.4 M/L (STAPLE)
BAG COUNTER SPONGE SURGICOUNT (BAG) IMPLANT
BLADE EXTENDED COATED 6.5IN (ELECTRODE) ×2 IMPLANT
CANNULA REDUC XI 12-8 STAPL (CANNULA) ×1
CANNULA REDUCER 12-8 DVNC XI (CANNULA) ×1 IMPLANT
CELLS DAT CNTRL 66122 CELL SVR (MISCELLANEOUS) IMPLANT
CHLORAPREP W/TINT 26 (MISCELLANEOUS) ×2 IMPLANT
CLIP APPLIE 5 13 M/L LIGAMAX5 (MISCELLANEOUS) IMPLANT
CLIP APPLIE ROT 10 11.4 M/L (STAPLE) IMPLANT
CLIP VESOLOCK LG 6/CT PURPLE (CLIP) IMPLANT
CLIP VESOLOCK MED LG 6/CT (CLIP) IMPLANT
COVER SURGICAL LIGHT HANDLE (MISCELLANEOUS) ×4 IMPLANT
COVER TIP SHEARS 8 DVNC (MISCELLANEOUS) ×1 IMPLANT
COVER TIP SHEARS 8MM DA VINCI (MISCELLANEOUS) ×1
DECANTER SPIKE VIAL GLASS SM (MISCELLANEOUS) ×2 IMPLANT
DERMABOND ADVANCED (GAUZE/BANDAGES/DRESSINGS) ×1
DERMABOND ADVANCED .7 DNX12 (GAUZE/BANDAGES/DRESSINGS) ×1 IMPLANT
DEVICE TROCAR PUNCTURE CLOSURE (ENDOMECHANICALS) IMPLANT
DRAIN CHANNEL 19F RND (DRAIN) ×2 IMPLANT
DRAPE ARM DVNC X/XI (DISPOSABLE) ×4 IMPLANT
DRAPE COLUMN DVNC XI (DISPOSABLE) ×1 IMPLANT
DRAPE DA VINCI XI ARM (DISPOSABLE) ×4
DRAPE DA VINCI XI COLUMN (DISPOSABLE) ×1
DRAPE SURG IRRIG POUCH 19X23 (DRAPES) ×2 IMPLANT
DRSG OPSITE POSTOP 4X10 (GAUZE/BANDAGES/DRESSINGS) IMPLANT
DRSG OPSITE POSTOP 4X6 (GAUZE/BANDAGES/DRESSINGS) ×2 IMPLANT
DRSG OPSITE POSTOP 4X8 (GAUZE/BANDAGES/DRESSINGS) IMPLANT
DRSG TEGADERM 2-3/8X2-3/4 SM (GAUZE/BANDAGES/DRESSINGS) ×10 IMPLANT
DRSG TEGADERM 4X4.75 (GAUZE/BANDAGES/DRESSINGS) ×2 IMPLANT
ELECT REM PT RETURN 15FT ADLT (MISCELLANEOUS) ×2 IMPLANT
ENDOLOOP SUT PDS II  0 18 (SUTURE)
ENDOLOOP SUT PDS II 0 18 (SUTURE) IMPLANT
EVACUATOR SILICONE 100CC (DRAIN) ×2 IMPLANT
GAUZE SPONGE 2X2 8PLY STRL LF (GAUZE/BANDAGES/DRESSINGS) ×1 IMPLANT
GAUZE SPONGE 4X4 12PLY STRL (GAUZE/BANDAGES/DRESSINGS) IMPLANT
GLOVE SURG ENC MOIS LTX SZ7.5 (GLOVE) ×6 IMPLANT
GLOVE SURG UNDER LTX SZ8 (GLOVE) ×6 IMPLANT
GOWN STRL REUS W/TWL XL LVL3 (GOWN DISPOSABLE) ×10 IMPLANT
GRASPER SUT TROCAR 14GX15 (MISCELLANEOUS) IMPLANT
HOLDER FOLEY CATH W/STRAP (MISCELLANEOUS) ×2 IMPLANT
KIT PROCEDURE DA VINCI SI (MISCELLANEOUS) ×1
KIT PROCEDURE DVNC SI (MISCELLANEOUS) ×1 IMPLANT
KIT TURNOVER KIT A (KITS) ×2 IMPLANT
LIGASURE SHORT ATLAS 20CM (ELECTROSURGICAL) ×2 IMPLANT
NEEDLE INSUFFLATION 14GA 120MM (NEEDLE) ×2 IMPLANT
PACK CARDIOVASCULAR III (CUSTOM PROCEDURE TRAY) ×2 IMPLANT
PACK COLON (CUSTOM PROCEDURE TRAY) ×2 IMPLANT
PAD POSITIONING PINK XL (MISCELLANEOUS) ×2 IMPLANT
PENCIL SMOKE EVACUATOR (MISCELLANEOUS) IMPLANT
PORT LAP GEL ALEXIS MED 5-9CM (MISCELLANEOUS) ×2 IMPLANT
PROTECTOR NERVE ULNAR (MISCELLANEOUS) ×4 IMPLANT
RELOAD AUTO 90-3.5 TA90 BLE (ENDOMECHANICALS) ×4 IMPLANT
RELOAD PROXIMATE 75MM BLUE (ENDOMECHANICALS) ×12 IMPLANT
RELOAD STAPLER 3.5X45 BLU DVNC (STAPLE) IMPLANT
RELOAD STAPLER 3.5X60 BLU DVNC (STAPLE) IMPLANT
RELOAD STAPLER 4.3X45 GRN DVNC (STAPLE) IMPLANT
RELOAD STAPLER 4.3X60 GRN DVNC (STAPLE) IMPLANT
RTRCTR WOUND ALEXIS 18CM MED (MISCELLANEOUS)
SCISSORS LAP 5X35 DISP (ENDOMECHANICALS) IMPLANT
SEAL CANN UNIV 5-8 DVNC XI (MISCELLANEOUS) ×5 IMPLANT
SEAL XI 5MM-8MM UNIVERSAL (MISCELLANEOUS) ×5
SEALER VESSEL DA VINCI XI (MISCELLANEOUS) ×1
SEALER VESSEL EXT DVNC XI (MISCELLANEOUS) ×1 IMPLANT
SET IRRIG TUBING LAPAROSCOPIC (IRRIGATION / IRRIGATOR) ×2 IMPLANT
SLEEVE ADV FIXATION 5X100MM (TROCAR) IMPLANT
SOLUTION ELECTROLUBE (MISCELLANEOUS) ×2 IMPLANT
SPONGE GAUZE 2X2 STER 10/PKG (GAUZE/BANDAGES/DRESSINGS) ×1
STAPLER 90 3.5 STAND SLIM (STAPLE) ×2
STAPLER 90 3.5 STD SLIM (STAPLE) ×1 IMPLANT
STAPLER CANNULA SEAL DVNC XI (STAPLE) ×1 IMPLANT
STAPLER CANNULA SEAL XI (STAPLE) ×1
STAPLER ECHELON POWER CIR 29 (STAPLE) IMPLANT
STAPLER ECHELON POWER CIR 31 (STAPLE) IMPLANT
STAPLER PROXIMATE 75MM BLUE (STAPLE) ×2 IMPLANT
STAPLER RELOAD 3.5X45 BLU DVNC (STAPLE)
STAPLER RELOAD 3.5X45 BLUE (STAPLE)
STAPLER RELOAD 3.5X60 BLU DVNC (STAPLE)
STAPLER RELOAD 3.5X60 BLUE (STAPLE)
STAPLER RELOAD 4.3X45 GREEN (STAPLE)
STAPLER RELOAD 4.3X45 GRN DVNC (STAPLE)
STAPLER RELOAD 4.3X60 GREEN (STAPLE)
STAPLER RELOAD 4.3X60 GRN DVNC (STAPLE)
STAPLER SHEATH (SHEATH) ×1
STAPLER SHEATH ENDOWRIST DVNC (SHEATH) ×1 IMPLANT
STOPCOCK 4 WAY LG BORE MALE ST (IV SETS) ×4 IMPLANT
SURGILUBE 2OZ TUBE FLIPTOP (MISCELLANEOUS) ×2 IMPLANT
SUT MNCRL AB 4-0 PS2 18 (SUTURE) ×4 IMPLANT
SUT PDS AB 1 CT1 27 (SUTURE) IMPLANT
SUT PDS AB 1 TP1 96 (SUTURE) IMPLANT
SUT PROLENE 0 CT 2 (SUTURE) IMPLANT
SUT PROLENE 2 0 KS (SUTURE) ×2 IMPLANT
SUT PROLENE 2 0 SH DA (SUTURE) IMPLANT
SUT SILK 2 0 (SUTURE)
SUT SILK 2 0 SH CR/8 (SUTURE) ×2 IMPLANT
SUT SILK 2-0 18XBRD TIE 12 (SUTURE) IMPLANT
SUT SILK 3 0 (SUTURE) ×1
SUT SILK 3 0 SH CR/8 (SUTURE) ×4 IMPLANT
SUT SILK 3-0 18XBRD TIE 12 (SUTURE) ×1 IMPLANT
SUT V-LOC BARB 180 2/0GR6 GS22 (SUTURE)
SUT VIC AB 3-0 SH 18 (SUTURE) IMPLANT
SUT VIC AB 3-0 SH 27 (SUTURE)
SUT VIC AB 3-0 SH 27XBRD (SUTURE) IMPLANT
SUT VICRYL 0 UR6 27IN ABS (SUTURE) ×2 IMPLANT
SUTURE V-LC BRB 180 2/0GR6GS22 (SUTURE) IMPLANT
SYR 10ML LL (SYRINGE) ×2 IMPLANT
SYS LAPSCP GELPORT 120MM (MISCELLANEOUS)
SYSTEM LAPSCP GELPORT 120MM (MISCELLANEOUS) IMPLANT
TAPE UMBILICAL 1/8 X36 TWILL (MISCELLANEOUS) ×2 IMPLANT
TOWEL OR NON WOVEN STRL DISP B (DISPOSABLE) ×2 IMPLANT
TRAY FOLEY MTR SLVR 16FR STAT (SET/KITS/TRAYS/PACK) ×2 IMPLANT
TROCAR ADV FIXATION 5X100MM (TROCAR) ×2 IMPLANT
TUBING CONNECTING 10 (TUBING) ×6 IMPLANT
TUBING INSUFFLATION 10FT LAP (TUBING) ×2 IMPLANT

## 2021-01-09 NOTE — Anesthesia Procedure Notes (Signed)
Procedure Name: Intubation Date/Time: 01/09/2021 12:09 PM Performed by: Niel Hummer, CRNA Pre-anesthesia Checklist: Emergency Drugs available, Suction available, Patient being monitored and Patient identified Patient Re-evaluated:Patient Re-evaluated prior to induction Oxygen Delivery Method: Circle system utilized Preoxygenation: Pre-oxygenation with 100% oxygen Induction Type: IV induction Ventilation: Mask ventilation without difficulty Laryngoscope Size: Mac and 4 Grade View: Grade I Tube type: Oral Tube size: 7.5 mm Number of attempts: 1 Airway Equipment and Method: Stylet Placement Confirmation: ETT inserted through vocal cords under direct vision, breath sounds checked- equal and bilateral and positive ETCO2 Secured at: 23 cm Tube secured with: Tape Dental Injury: Teeth and Oropharynx as per pre-operative assessment

## 2021-01-09 NOTE — Anesthesia Postprocedure Evaluation (Signed)
Anesthesia Post Note  Patient: Hydrologist  Procedure(s) Performed: XI ROBOTIC ASSISTED RIGHT HEMI COLECTOMY, LYSIS OF ADHESIONS, SMALL BOWEL RESECTION, INTRAOPERATIVE ASSESMENT OF PERFUSION, PARTIAL OMENTECTOMY     Patient location during evaluation: PACU Anesthesia Type: General Level of consciousness: awake and alert Pain management: pain level controlled Vital Signs Assessment: post-procedure vital signs reviewed and stable Respiratory status: spontaneous breathing, nonlabored ventilation and respiratory function stable Cardiovascular status: blood pressure returned to baseline and stable Postop Assessment: no apparent nausea or vomiting Anesthetic complications: no   No notable events documented.  Last Vitals:  Vitals:   01/09/21 1615 01/09/21 1630  BP: (!) 159/73 (!) 173/75  Pulse: 70 66  Resp: 16 13  Temp:    SpO2: 100% 100%    Last Pain:  Vitals:   01/09/21 1615  TempSrc:   PainSc: 6                  Teller Wakefield A.

## 2021-01-09 NOTE — Discharge Instructions (Signed)
POST OP INSTRUCTIONS AFTER COLON SURGERY  DIET: Be sure to include lots of fluids daily to stay hydrated - 64oz of water per day (8, 8 oz glasses).  Avoid fast food or heavy meals for the first couple of weeks as your are more likely to get nauseated. Avoid raw/uncooked fruits or vegetables for the first 4 weeks (its ok to have these if they are blended into smoothie form). If you have fruits/vegetables, make sure they are cooked until soft enough to mash on the roof of your mouth and chew your food well. Otherwise, diet as tolerated.  Take your usually prescribed home medications unless otherwise directed.  PAIN CONTROL: Pain is best controlled by a usual combination of three different methods TOGETHER: Ice/Heat Over the counter pain medication Prescription pain medication Most patients will experience some swelling and bruising around the surgical site.  Ice packs or heating pads (30-60 minutes up to 6 times a day) will help. Some people prefer to use ice alone, heat alone, alternating between ice & heat.  Experiment to what works for you.  Swelling and bruising can take several weeks to resolve.   It is helpful to take an over-the-counter pain medication regularly for the first few weeks: Ibuprofen (Motrin/Advil) - 200mg tabs - take 3 tabs (600mg) every 6 hours as needed for pain (unless you have been directed previously to avoid NSAIDs/ibuprofen) Acetaminophen (Tylenol) - you may take 650mg every 6 hours as needed. You can take this with motrin as they act differently on the body. If you are taking a narcotic pain medication that has acetaminophen in it, do not take over the counter tylenol at the same time. NOTE: You may take both of these medications together - most patients  find it most helpful when alternating between the two (i.e. Ibuprofen at 6am, tylenol at 9am, ibuprofen at 12pm ...) A  prescription for pain medication should be given to you upon discharge.  Take your pain medication as  prescribed if your pain is not adequatly controlled with the over-the-counter pain reliefs mentioned above.  Avoid getting constipated.  Between the surgery and the pain medications, it is common to experience some constipation.  Increasing fluid intake and taking a fiber supplement (such as Metamucil, Citrucel, FiberCon, MiraLax, etc) 1-2 times a day regularly will usually help prevent this problem from occurring.  A mild laxative (prune juice, Milk of Magnesia, MiraLax, etc) should be taken according to package directions if there are no bowel movements after 48 hours.    Dressing: Your incisions are covered in Dermabond which is like sterile superglue for the skin. This will come off on it's own in a couple weeks. It is waterproof and you may bathe normally starting the day after your surgery in a shower. Avoid baths/pools/lakes/oceans until your wounds have fully healed.  ACTIVITIES as tolerated:   Avoid heavy lifting (>10lbs or 1 gallon of milk) for the next 6 weeks. You may resume regular daily activities as tolerated--such as daily self-care, walking, climbing stairs--gradually increasing activities as tolerated.  If you can walk 30 minutes without difficulty, it is safe to try more intense activity such as jogging, treadmill, bicycling, low-impact aerobics.  DO NOT PUSH THROUGH PAIN.  Let pain be your guide: If it hurts to do something, don't do it. You may drive when you are no longer taking prescription pain medication, you can comfortably wear a seatbelt, and you can safely maneuver your car and apply brakes.  FOLLOW UP in our   office Please call [facility] at [phone] to set up an appointment to see your surgeon in the office for a follow-up appointment approximately 2 weeks after your surgery. Make sure that you call for this appointment the day you arrive home to insure a convenient appointment time.  9. If you have disability or family leave forms that need to be completed, you may have  them completed by your primary care physician's office; for return to work instructions, please ask our office staff and they will be happy to assist you in obtaining this documentation   When to call us [phone]: Poor pain control Reactions / problems with new medications (rash/itching, etc)  Fever over 101.5 F (38.5 C) Inability to urinate Nausea/vomiting Worsening swelling or bruising Continued bleeding from incision. Increased pain, redness, or drainage from the incision  The clinic staff is available to answer your questions during regular business hours (8:30am-5pm).  Please don't hesitate to call and ask to speak to one of our nurses for clinical concerns.   A surgeon from [facility] is always on call at the hospitals   If you have a medical emergency, go to the nearest emergency room or call 911.  [facility] [address], [address], [place], [place]  [address] MAIN: [phone] FAX: [phone] [email or web address]  

## 2021-01-09 NOTE — H&P (Signed)
CC: Referred by Dr. Benson Norway - newly diagnosed transverse colon cancer   HPI: Mr. Thomas Knight is a very pleasant 21yoM with hx of HTN, HLD, obesity, afib (on Eliquis, Dr. Harriet Masson in [place]) - here today for surgery - he underwent his first colonoscopy at Dr. Benson Norway 10/2020.  He was found to have a mass in his transverse colon that was biopsied. By report, this showed colon cancer.  He subsequently had staging CT chest/abdomen/pelvis that demonstrates an apple core mass in his transverse colon appears to be in the proximal half of it measuring 4.6 cm in size.  No local regional adenopathy apparent on the CAT scan.  No findings of metastatic disease.  He denies any symptoms related to this including history of bleeding, abdominal pain, bloating, nausea or vomiting.  He reports brown stool.   He tolerated his bowel prep without issue but good result. He has been off Eliquis perioperatively. He denies any complaints, changes in his health or health history.   Pathology on his colonoscopy demonstrated moderately differentiated invasive adenocarcinoma.  The 4 mm descending colon polyp showed adenomatous polyp with high-grade dysplasia.  PMH: HTN, HLD, obesity, afib (on Eliquis, Dr. Harriet Masson in [place])   [name]: Primary ventral hernia repair with Dr.Linninger in [place] - with mesh ~2010; ankle surgery   FHx: Denies FHx of colorectal, breast, endometrial, ovarian or cervical cancer   Social: Denies use of tobacco/EtOH/drugs.  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   ROS: A comprehensive 10 system review of systems was completed with the patient and pertinent findings as noted above.  Past Medical History:  Diagnosis Date   Bilateral leg edema 08/24/2019   Cancer ([place])    skin ca, colon   Chronic cough    Chronic pain of left knee 12/01/2015   Dysrhythmia    afib  was cardioverted   Essential hypertension 08/24/2019    Mixed hyperlipidemia 08/24/2019   Obesity (BMI 30-39.9) 08/24/2019   Persistent atrial fibrillation ([place]) 08/24/2019    Past Surgical History:  Procedure Laterality Date   ACHILLES TENDON REPAIR Left 2012   Ruptured   HERNIA REPAIR     umbilical hernia   SKIN SURGERY Left    Basal cell carcinoma on left forearm    Family History  Problem Relation Age of Onset   Lung cancer Father     Social:  reports that he has never smoked. He has never used smokeless tobacco. He reports that he does not drink alcohol and does not use drugs.  Allergies: No Known Allergies  Medications: I have reviewed the patient's current medications.  No results found for this or any previous visit (from the past 48 hour(s)).  No results found.  ROS - all of the below systems have been reviewed with the patient and positives are indicated with bold text General: chills, fever or night sweats Eyes: blurry vision or double vision ENT: epistaxis or sore throat Allergy/Immunology: itchy/watery eyes or nasal congestion Hematologic/Lymphatic: bleeding problems, blood clots or swollen lymph nodes Endocrine: temperature intolerance or unexpected weight changes Breast: new or changing breast lumps or nipple discharge Resp: cough, shortness of breath, or wheezing CV: chest pain or dyspnea on exertion GI: as per HPI GU: dysuria, trouble voiding, or hematuria MSK: joint pain or joint stiffness Neuro: TIA or stroke symptoms Derm: pruritus and skin lesion changes Psych: anxiety and depression  PE Blood pressure (!) 161/80, pulse 70, temperature 98.2 F (36.8 C), temperature source Oral, resp. rate 16, height 5\' 11"  (1.803 m), weight 117 kg, SpO2 99 %. Constitutional: NAD; conversant Eyes: Moist conjunctiva; no lid lag; anicteric Neck: Trachea midline Lungs: Normal respiratory effort CV: RRR; no pitting edema GI: Abd soft, NT/ND; no palpable hepatosplenomegaly MSK: Normal range of motion of  extremities Psychiatric: Appropriate affect; alert and oriented x3  No results found for this or any previous visit (from the past 48 hour(s)).  No results found.   A/P: [name] is a very pleasant 89yoM with newly diagnosed mass in transverse colon; hx of HTN, HLD, obesity, afib (on Eliquis, Dr. Harriet Masson in [place])  CT CAP 11/06/20 - no evident metastatic disease; mass evident in mid transverse colon  -Cardiac clearance obtained; plan for perioperative Xarelto to be held (Dr. Harriet Masson) - 2 days prior per cardiology. -Colonoscopy report/path requested from Dr. Benson Norway; mass is evident on his CT scan in the transverse colon; no evident metastatic disease -The anatomy and physiology of the GI tract was reviewed with the patient. The pathophysiology of colon cancer was discussed as well. -We have reviewed likely robotic/laparoscopic right hemicolectomy based on mass location; scenarios where other procedures may be necessary based on blood supply to the colon including extended right hemicolectomy. -The planned procedure, material risks (including, but not limited to, pain, bleeding, infection, scarring, need for blood transfusion, damage to surrounding structures- blood vessels/nerves/viscus/organs, damage to ureter, urine leak, leak from anastomosis, need for additional procedures, worsening of pre-existing medical conditions, need for stoma which may be permanent, hernia, recurrence, DVT/PE, pneumonia, heart attack, stroke, death) benefits and alternatives to surgery were discussed at length. The patient's questions were answered to his satisfaction, he voiced understanding and elected to proceed with surgery. Additionally, we discussed typical postoperative expectations and the recovery process.  Nadeen Landau, MD [facility] Use [email or web address] to contact on call provider

## 2021-01-09 NOTE — Anesthesia Preprocedure Evaluation (Addendum)
Anesthesia Evaluation  Patient identified by MRN, date of birth, ID band Patient awake    Reviewed: Allergy & Precautions, NPO status , Patient's Chart, lab work & pertinent test results, reviewed documented beta blocker date and time   Airway Mallampati: I  TM Distance: >3 FB Neck ROM: Full    Dental no notable dental hx. (+) Teeth Intact, Caps, Dental Advisory Given   Pulmonary shortness of breath and with exertion, sleep apnea and Continuous Positive Airway Pressure Ventilation ,  [id number] 10/5730, complicated by Atrial Fibrillation   Pulmonary exam normal breath sounds clear to auscultation       Cardiovascular hypertension, Pt. on medications and Pt. on home beta blockers + CAD  Normal cardiovascular exam+ dysrhythmias Atrial Fibrillation  Rhythm:Regular Rate:Normal  EKG 01/10/20 Sinus Bradycardia, 1st degree AV Block   Neuro/Psych negative neurological ROS  negative psych ROS   GI/Hepatic Neg liver ROS, Colon Ca   Endo/Other  Hyperlipidemia Obesity  Renal/GU negative Renal ROS  negative genitourinary   Musculoskeletal   Abdominal (+) + obese,   Peds  Hematology Eliquis therapy- last dose 7/12   Anesthesia Other Findings   Reproductive/Obstetrics                           Anesthesia Physical Anesthesia Plan  ASA: 3  Anesthesia Plan: General   Post-op Pain Management:    Induction: Intravenous  PONV Risk Score and Plan: 4 or greater and Treatment may vary due to age or medical condition, Ondansetron and Dexamethasone  Airway Management Planned: Oral ETT  Additional Equipment:   Intra-op Plan:   Post-operative Plan: Extubation in OR  Informed Consent: I have reviewed the patients History and Physical, chart, labs and discussed the procedure including the risks, benefits and alternatives for the proposed anesthesia with the patient or authorized representative who has indicated  his/her understanding and acceptance.     Dental advisory given  Plan Discussed with: CRNA and Anesthesiologist  Anesthesia Plan Comments:       Anesthesia Quick Evaluation

## 2021-01-09 NOTE — Transfer of Care (Signed)
Immediate Anesthesia Transfer of Care Note  Patient: Hydrologist  Procedure(s) Performed: XI ROBOTIC ASSISTED RIGHT HEMI COLECTOMY, LYSIS OF ADHESIONS, SMALL BOWEL RESECTION, INTRAOPERATIVE ASSESMENT OF PERFUSION, PARTIAL OMENTECTOMY  Patient Location: PACU  Anesthesia Type:General  Level of Consciousness: awake  Airway & Oxygen Therapy: Patient Spontanous Breathing and Patient connected to face mask oxygen  Post-op Assessment: Report given to RN, Post -op Vital signs reviewed and stable and Patient moving all extremities X 4  Post vital signs: Reviewed and stable  Last Vitals:  Vitals Value Taken Time  BP 145/70 01/09/21 1540  Temp    Pulse 59 01/09/21 1540  Resp 9 01/09/21 1541  SpO2 100 % 01/09/21 1540  Vitals shown include unvalidated device data.  Last Pain:  Vitals:   01/09/21 1102  TempSrc:   PainSc: 0-No pain      Patients Stated Pain Goal: 3 (35/32/99 2426)  Complications: No notable events documented.

## 2021-01-09 NOTE — Op Note (Signed)
PATIENT: Thomas Knight  75 y.o. male  Patient Care Team: Angelina Sheriff, MD as PCP - General (Family Medicine) Berniece Salines, DO as PCP - Cardiology (Cardiology)  PREOP DIAGNOSIS: Colon cancer transverse colon  POSTOP DIAGNOSIS: Transverse colon cancer Densely adherent small bowel to paraumbilical mesh   PROCEDURE: Robotic-assisted right hemicolectomy Small bowel resection Excision of involved mesh 4 x 2 cm Partial omentectomy Intraoperative assessment of perfusion Bilateral transversus abdominus plane blocks  SURGEON: Nadeen Landau, MD  ASSISTANT: Michael Boston, MD  ANESTHESIA: General endotracheal  EBL: 20 mL Total I/O In: 1100 [I.V.:1000; IV Piggyback:100] Out: 42 [Urine:50; Blood:20]  DRAINS: None  SPECIMEN:  Right colon (terminal ileum, appendix en bloc) Small bowel with associated mesh Omentum  COUNTS: Sponge, needle and instrument counts were reported correct x2  FINDINGS:  Tattoo and associated mass in proximal to mid transverse colon. No obvious metastatic disease on visceral parietal peritoneum or liver. Dense adhesions between anterior abdominal wall mesh and a loop of small bowel. This was able to ultimately be freed but there was a significant segment of deserosalized bowel with 1 punctate area with a full-thickness enterotomy.  Therefore, a small bowel resection was carried out.  Right hemicolectomy carried out uneventfully including the right branch of the middle colic. In doing this, there is a large tongue of omentum of questionable viability and therefore was excised as well.   NARRATIVE:  The patient was identified & brought into the operating room, placed supine on the operating table and SCDs were applied to the lower extremities. General endotracheal anesthesia was induced. The patient was positioned supine with arms tucked. Antibiotics were administered. A foley catheter was placed under sterile conditions. Pressure points were then padded  and he is secured to the operating table. Hair in the region of planned surgery was clipped. The abdomen was prepped and draped in a sterile fashion. A timeout was performed confirming our patient and plan.   An OG tube was placed by anesthesia and confirmed to be to suction.  At Palmer's point, a stab incision was created and the Veress needle introduced into the peritoneal cavity and the first attempt.  Intraperitoneal location was confirmed with the aspiration and saline drop test.  Pneumoperitoneum was established to maximum pressure 15 mmHg with CO2.  An 8 mm blunt tipped robotic trocar was then cautiously placed in the left upper quadrant.  The laparoscope was inserted and demonstrated no evidence of Veress needle or trocar site complications.  Bilateral transversus abdominis plane blocks were created using a dilute mixture of Exparel with 0.25% Marcaine with epinephrine.  2 additional 8 mm trochars were placed in a line extending from the left upper quadrant to the right lower quadrant.  A 5 mm cyst port was placed.  3 fingerbreadths above the pubic symphysis, an additional 8 mm trocar was placed.  This was well above the bladder and directed cephalad.  There is a significant amount of adhesions in the periumbilical midline and involving both omentum and small bowel.  Adhesiolysis is then carried out dissecting away the omentum sharply with scissors.  There is 2 loops of small bowel that are densely adherent to the mesh. In order to facilitate the surgery, the adhered omentum and bowel need to be freed as they prevent terminal ileum mobilization and visualization to safely carry out resection.  1 of these loops is able to be separated relatively easily and without any serosal injury. The mesh is actually not adherent at this location  and appears to have partially flipped up. There is no apparent hernia underneath. Another loop of small bowel is densely adherent on over 75% of its surface with no real plane  between mesh and serosa.  This is taken off carefully with scissors.  In doing so, it is apparent that there is a significant area of deserosalization.  This was inherent to the nature of this procedure given this involvement with the mesh.  He was then positioned in trendelenburg with left side down. The robot was docked and I went to the console. The ileocolic pedicle was identified. Overlying peritoneum is incised. Gentle blunt dissection commenced around the pedicle and the duodenum was identified and freed from the surrounding structures with careful blunt dissection.  After confirming the location of the duodenum, the ileocolic pedicle was circumferentially dissected until skeletonized.  This was then divided with the Enseal device.  The pedicle was inspected and noted to be hemostatic.  The retroperitoneal plane extending up to the level of the hepatic flexure was developed bluntly separating the mesocolon from Gerota's.   The lateral plane was then developed.  The appendix was grasped and retracted medially.  The terminal ileal attachments were carefully separated from the underlying retroperitoneum.  The cecum and ascending colon were then mobilized medially by incising the Makail Watling line of Toldt all the way up to the hepatic flexure.   Our attention was then directed to the transverse colon.  The tattoo and associated lesion were readily identified.  The colon was retracted inferiorly and the omentum anteriorly.  The lesser sac was gained.  The omentum was freed from its attachments to the colon.  The hepatic flexure was then completely mobilized from this approach.  At this point, the cecum easily reached into the left upper quadrant without difficulty.   The abdomen is reinspected and hemostasis is appreciated.  We then turned our attention to the extracorporeal portion of the procedure given the planned small bowel repair versus resection.  The robot is undocked and I scrubbed back in.  A  supraumbilical extraction incision was created.  Dissection is carried down and the fascia is incised.  Under pneumoperitoneum, the peritoneum was entered.  An [place] wound protector was placed.  Towels were placed around the field.  The cecum was delivered into the field.  The proximal point of transection was identified on the distal aspect of the ileum.  A window was created in the mesentery.  This is then divided using a linear cutting GIA blue load stapler.  A clamp was placed on the ileal staple line to assist in maintaining orientation.  The mesentery is then ligated using a LigaSure.  The distal point of transection is identified including at least 5 cm gross margin.  A window was created in the mesentery at this location.  The colon was then divided with a GIA blue load stapler.  The mesentery is inspected.  The main middle colic pedicle was able to be preserved but taking the right branch of this vessel is planned.  The remaining mesentery was then ligated using the LigaSure device after again confirming the location of the duodenum.  The mesentery was inspected noted to be hemostatic.  The specimen was passed off.  There is an apparent long tongue of omentum of questionable viability that is also then resected using the LigaSure.  This is passed off as additional specimen.  We then turned our attention to performing the ICG perfusion test to ensure the remaining colon  had adequate perfusion.  ICG was administered and under near infrared light using a robotic camera, avid uptake of the tracer is apparent in both the small intestine to the level of the staple line as well as within the remaining colon.  This is consistent with an excellent perfusion result.  There is also a palpable pulse in the mesentery going out to the remaining colon.  Attention is then turned to creating the ilio transverse anastomosis.  The ileum was inspected and orientation confirmed such that there is no twisting of the  mesentery or bowel.  Enterotomies were created in both respective limbs.  The anastomosis was then fashioned using a 75 mm GIA blue load stapler along the antimesenteric border of the small bowel and the tinea of the colon.  This anastomosis is then inspected and noted to be hemostatic.  Including the enterotomy in both respective bowel staple lines, a TA 90 blue load stapler was then used to close the defect.  The corners of this staple line are then "dunked" using 2-0 silk suture.  A stitch is also placed at the apex of this anastomosis.  The anastomosis was palpated and noted to be widely patent.  This is placed back in the abdomen.  The small bowel was then run from the anastomosis proximally.  The 2 areas where the bowel had been taken down from the mesh are inspected.  This is within the midportion of the small bowel.  The first segment is clearly intact without any apparent serosal injury.  The second loop of bowel does appear to have a significant deserosalization incorporating the 75% of the wall that was adherent to the mesh and additionally a punctate full-thickness enterotomy is apparent.  Again, this is inherent to the nature of the procedure.  A small bowel resection is therefore planned.  Windows were created in the mesentery and the small bowel divided with GIA blue load staplers.  The intervening mesentery was ligated with the LigaSure.  Enterotomies were created in the small bowel anastomosis was created using a 75 mm blue load GIA stapler.  The staple line is inspected and hemostatic.  The common enterotomy and associated staple lines are excised using a TA 90 blue load stapler.  This is passed off as specimen.  The anastomosis is palpated noted to be widely patent, at least 3 fingerbreadths.  An apex stitch of 2-0 silk was placed.  The corners of the TA staple line are dunked using 2-0 silk suture.  This is then placed back into the abdomen.  The abdomen is irrigated and hemostasis apparent.   All trochars were removed and hemostatic.  We then switched over to the clean portion of the procedure.  The wound protector was removed and all equipment passed off.  All participants changed to clean gown/gloves.  Sterile drapes were placed around the field.  New instruments were utilized.  The umbilical mesh was inspected and the free-floating segment of this mesh that was adherent to the small bowel was excised.  This is passed off the specimen to go with the small bowel.  This is approximately 4 x 2 cm in size.  The abdominal wall was palpated and there is no apparent defect or palpable hernia.  Omentum was then brought down over the midline.  The midline fascia was closed using 2 running #1 PDS sutures.  Sponge, needle, and instrument counts were reported correct x2.  Skin of all incision sites approximated with 4-0 Monocryl subcuticular suture.  Dermabond is applied.  He is then awakened from anesthesia, extubated, and transferred to a stretcher for transport to PACU in satisfactory condition.  DISPOSITION: PACU in satisfactory condition

## 2021-01-10 ENCOUNTER — Other Ambulatory Visit (HOSPITAL_COMMUNITY): Payer: Self-pay

## 2021-01-10 ENCOUNTER — Encounter (HOSPITAL_COMMUNITY): Payer: Self-pay | Admitting: Surgery

## 2021-01-10 LAB — CBC
HCT: 38.1 % — ABNORMAL LOW (ref 39.0–52.0)
Hemoglobin: 12.2 g/dL — ABNORMAL LOW (ref 13.0–17.0)
MCH: 27.8 pg (ref 26.0–34.0)
MCHC: 32 g/dL (ref 30.0–36.0)
MCV: 86.8 fL (ref 80.0–100.0)
Platelets: 216 10*3/uL (ref 150–400)
RBC: 4.39 MIL/uL (ref 4.22–5.81)
RDW: 15.4 % (ref 11.5–15.5)
WBC: 13.9 10*3/uL — ABNORMAL HIGH (ref 4.0–10.5)
nRBC: 0 % (ref 0.0–0.2)

## 2021-01-10 LAB — BASIC METABOLIC PANEL
Anion gap: 10 (ref 5–15)
BUN: 9 mg/dL (ref 8–23)
CO2: 23 mmol/L (ref 22–32)
Calcium: 8.8 mg/dL — ABNORMAL LOW (ref 8.9–10.3)
Chloride: 105 mmol/L (ref 98–111)
Creatinine, Ser: 0.86 mg/dL (ref 0.61–1.24)
GFR, Estimated: >60 mL/min (ref >60)
Glucose, Bld: 143 mg/dL — ABNORMAL HIGH (ref 70–99)
Potassium: 4.3 mmol/L (ref 3.5–5.1)
Sodium: 138 mmol/L (ref 135–145)

## 2021-01-10 NOTE — Progress Notes (Signed)
1 Day Post-Op   Subjective/Chief Complaint: Doing well Bm today Pain controlled   Objective: Vital signs in last 24 hours: Temp:  [97.6 F (36.4 C)-98.5 F (36.9 C)] 98.1 F (36.7 C) (07/16 1013) Pulse Rate:  [64-79] 72 (07/16 1013) Resp:  [12-18] 18 (07/16 1013) BP: (137-191)/(68-88) 150/70 (07/16 1013) SpO2:  [92 %-100 %] 96 % (07/16 1013) Weight:  [117 kg] 117 kg (07/16 0500) Last BM Date: 01/10/21  Intake/Output from previous day: 07/15 0701 - 07/16 0700 In: 1590 [P.O.:90; I.V.:1400; IV Piggyback:100] Out: 1370 [Urine:1350; Blood:20] Intake/Output this shift: Total I/O In: 240 [P.O.:240] Out: 0   General appearance: alert and cooperative Resp: clear to auscultation bilaterally Cardio: regular rate and rhythm, S1, S2 normal, no murmur, click, rub or gallop Incision/Wound:CDI sot minimal tenderness   Lab Results:  Recent Labs    01/10/21 0417  WBC 13.9*  HGB 12.2*  HCT 38.1*  PLT 216   BMET Recent Labs    01/10/21 0417  NA 138  K 4.3  CL 105  CO2 23  GLUCOSE 143*  BUN 9  CREATININE 0.86  CALCIUM 8.8*   PT/INR No results for input(s): LABPROT, INR in the last 72 hours. ABG No results for input(s): PHART, HCO3 in the last 72 hours.  Invalid input(s): PCO2, PO2  Studies/Results: No results found.  Anti-infectives: Anti-infectives (From admission, onward)    Start     Dose/Rate Route Frequency Ordered Stop   01/09/21 1400  neomycin (MYCIFRADIN) tablet 1,000 mg  Status:  Discontinued       See Hyperspace for full Linked Orders Report.   1,000 mg Oral 3 times per day 01/09/21 1041 01/09/21 1047   01/09/21 1400  metroNIDAZOLE (FLAGYL) tablet 1,000 mg  Status:  Discontinued       See Hyperspace for full Linked Orders Report.   1,000 mg Oral 3 times per day 01/09/21 1041 01/09/21 1047   01/09/21 0600  cefoTEtan (CEFOTAN) 2 g in sodium chloride 0.9 % 100 mL IVPB        2 g 200 mL/hr over 30 Minutes Intravenous On call to O.R. 01/08/21 0724  01/09/21 1240       Assessment/Plan: s/p Procedure(s): XI ROBOTIC ASSISTED RIGHT HEMI COLECTOMY, LYSIS OF ADHESIONS, SMALL BOWEL RESECTION, INTRAOPERATIVE ASSESMENT OF PERFUSION, PARTIAL OMENTECTOMY (N/A) Advance diet ambulate  LOS: 1 day    Thomas Knight 01/10/2021

## 2021-01-10 NOTE — Progress Notes (Signed)
Pharmacy Brief Note - Alvimopan (Entereg)  The standing order set for alvimopan (Entereg) now includes an automatic order to discontinue the drug after the patient has had a bowel movement. The change was approved by the [place] and the Medical Executive Committee.   This patient has had bowel movements documented by nursing. Therefore, alvimopan has been discontinued. If there are questions, please contact the pharmacy at [phone].   Thank you-   Dia Sitter, PharmD, BCPS 01/10/2021 10:38 AM

## 2021-01-11 LAB — CBC
HCT: 35.7 % — ABNORMAL LOW (ref 39.0–52.0)
Hemoglobin: 11.3 g/dL — ABNORMAL LOW (ref 13.0–17.0)
MCH: 28 pg (ref 26.0–34.0)
MCHC: 31.7 g/dL (ref 30.0–36.0)
MCV: 88.4 fL (ref 80.0–100.0)
Platelets: 208 10*3/uL (ref 150–400)
RBC: 4.04 MIL/uL — ABNORMAL LOW (ref 4.22–5.81)
RDW: 15.8 % — ABNORMAL HIGH (ref 11.5–15.5)
WBC: 13.3 10*3/uL — ABNORMAL HIGH (ref 4.0–10.5)
nRBC: 0 % (ref 0.0–0.2)

## 2021-01-11 LAB — BASIC METABOLIC PANEL
Anion gap: 6 (ref 5–15)
BUN: 13 mg/dL (ref 8–23)
CO2: 28 mmol/L (ref 22–32)
Calcium: 8.4 mg/dL — ABNORMAL LOW (ref 8.9–10.3)
Chloride: 104 mmol/L (ref 98–111)
Creatinine, Ser: 0.9 mg/dL (ref 0.61–1.24)
GFR, Estimated: >60 mL/min (ref >60)
Glucose, Bld: 116 mg/dL — ABNORMAL HIGH (ref 70–99)
Potassium: 4.1 mmol/L (ref 3.5–5.1)
Sodium: 138 mmol/L (ref 135–145)

## 2021-01-11 NOTE — Progress Notes (Signed)
     Assessment & Plan: POD#2 - s/p Procedure(s): XI ROBOTIC ASSISTED RIGHT HEMI COLECTOMY, LYSIS OF ADHESIONS, SMALL BOWEL RESECTION, INTRAOPERATIVE ASSESMENT OF PERFUSION, PARTIAL OMENTECTOMY (N/A) - Dr. Annye English  Soft diet - having loose BM's  Ambulating in halls  Hold anticoagulation - plan to restart tomorrow        Armandina Gemma, MD       [facility]       Office: [phone]   Chief Complaint: Colon cancer  Subjective: Patient up in chair, no complaints.  Loose BM's.  Tolerating diet.  Objective: Vital signs in last 24 hours: Temp:  [98 F (36.7 C)-99 F (37.2 C)] 99 F (37.2 C) (07/17 0545) Pulse Rate:  [61-75] 65 (07/17 0545) Resp:  [18] 18 (07/17 0545) BP: (137-153)/(60-70) 137/60 (07/17 0545) SpO2:  [94 %-97 %] 94 % (07/17 0545) Last BM Date: 01/10/21  Intake/Output from previous day: 07/16 0701 - 07/17 0700 In: 1440 [P.O.:1440] Out: 600 [Urine:600] Intake/Output this shift: Total I/O In: 240 [P.O.:240] Out: -   Physical Exam: HEENT - sclerae clear, mucous membranes moist Neck - soft Abdomen - obese, soft, protuberant; BS present; wounds dry and intact Ext - no edema, non-tender Neuro - alert & oriented, no focal deficits  Lab Results:  Recent Labs    01/10/21 0417 01/11/21 0419  WBC 13.9* 13.3*  HGB 12.2* 11.3*  HCT 38.1* 35.7*  PLT 216 208   BMET Recent Labs    01/10/21 0417 01/11/21 0419  NA 138 138  K 4.3 4.1  CL 105 104  CO2 23 28  GLUCOSE 143* 116*  BUN 9 13  CREATININE 0.86 0.90  CALCIUM 8.8* 8.4*   PT/INR No results for input(s): LABPROT, INR in the last 72 hours. Comprehensive Metabolic Panel:    Component Value Date/Time   NA 138 01/11/2021 0419   NA 138 01/10/2021 0417   NA 142 02/20/2020 0912   NA 140 08/24/2019 1128   K 4.1 01/11/2021 0419   K 4.3 01/10/2021 0417   CL 104 01/11/2021 0419   CL 105 01/10/2021 0417   CO2 28 01/11/2021 0419   CO2 23 01/10/2021 0417   BUN 13 01/11/2021 0419    BUN 9 01/10/2021 0417   BUN 9 02/20/2020 0912   BUN 13 08/24/2019 1128   CREATININE 0.90 01/11/2021 0419   CREATININE 0.86 01/10/2021 0417   GLUCOSE 116 (H) 01/11/2021 0419   GLUCOSE 143 (H) 01/10/2021 0417   CALCIUM 8.4 (L) 01/11/2021 0419   CALCIUM 8.8 (L) 01/10/2021 0417   AST 23 12/23/2020 1103   ALT 23 12/23/2020 1103   ALKPHOS 60 12/23/2020 1103   BILITOT 0.6 12/23/2020 1103   PROT 7.1 12/23/2020 1103   ALBUMIN 4.0 12/23/2020 1103    Studies/Results: No results found.    Armandina Gemma 01/11/2021   Patient ID: Thomas Knight, male   DOB: 11-22-1945, 75 y.o.   MRN: [id number]

## 2021-01-12 ENCOUNTER — Other Ambulatory Visit (HOSPITAL_COMMUNITY): Payer: Self-pay

## 2021-01-12 LAB — BASIC METABOLIC PANEL
Anion gap: 8 (ref 5–15)
BUN: 13 mg/dL (ref 8–23)
CO2: 28 mmol/L (ref 22–32)
Calcium: 8.7 mg/dL — ABNORMAL LOW (ref 8.9–10.3)
Chloride: 102 mmol/L (ref 98–111)
Creatinine, Ser: 0.89 mg/dL (ref 0.61–1.24)
GFR, Estimated: >60 mL/min (ref >60)
Glucose, Bld: 135 mg/dL — ABNORMAL HIGH (ref 70–99)
Potassium: 4.1 mmol/L (ref 3.5–5.1)
Sodium: 138 mmol/L (ref 135–145)

## 2021-01-12 LAB — CBC
HCT: 36.3 % — ABNORMAL LOW (ref 39.0–52.0)
Hemoglobin: 11.5 g/dL — ABNORMAL LOW (ref 13.0–17.0)
MCH: 27.9 pg (ref 26.0–34.0)
MCHC: 31.7 g/dL (ref 30.0–36.0)
MCV: 88.1 fL (ref 80.0–100.0)
Platelets: 242 10*3/uL (ref 150–400)
RBC: 4.12 MIL/uL — ABNORMAL LOW (ref 4.22–5.81)
RDW: 15.9 % — ABNORMAL HIGH (ref 11.5–15.5)
WBC: 11 10*3/uL — ABNORMAL HIGH (ref 4.0–10.5)
nRBC: 0 % (ref 0.0–0.2)

## 2021-01-12 MED ORDER — APIXABAN 5 MG PO TABS
5.0000 mg | ORAL_TABLET | Freq: Two times a day (BID) | ORAL | Status: DC
  Administered 2021-01-12 – 2021-01-13 (×3): 5 mg via ORAL
  Filled 2021-01-12 (×3): qty 1

## 2021-01-12 NOTE — Progress Notes (Signed)
Subjective No acute events. Having flatus and Bms. Denies n/v. Denies distention. Over the weekend had some blackish Bms. Those have since become more formed and brown. Denies abdominal pain at present; some soreness about his extraction incision.  Objective: Vital signs in last 24 hours: Temp:  [98.4 F (36.9 C)-98.7 F (37.1 C)] 98.7 F (37.1 C) (07/18 0500) Pulse Rate:  [63-70] 63 (07/18 0500) Resp:  [18] 18 (07/18 0500) BP: (148-150)/(75-76) 150/75 (07/18 0500) SpO2:  [97 %-98 %] 97 % (07/18 0500) Last BM Date: 01/11/21  Intake/Output from previous day: 07/17 0701 - 07/18 0700 In: 1680 [P.O.:1680] Out: 0  Intake/Output this shift: No intake/output data recorded.  Gen: NAD, comfortable CV: RRR Pulm: Normal work of breathing Abd: Soft, NT/ND. Incisions are c/d/I without erythema or drainage Ext: SCDs in place  Lab Results: CBC  Recent Labs    01/11/21 0419 01/12/21 0408  WBC 13.3* 11.0*  HGB 11.3* 11.5*  HCT 35.7* 36.3*  PLT 208 242   BMET Recent Labs    01/11/21 0419 01/12/21 0408  NA 138 138  K 4.1 4.1  CL 104 102  CO2 28 28  GLUCOSE 116* 135*  BUN 13 13  CREATININE 0.90 0.89  CALCIUM 8.4* 8.7*   PT/INR No results for input(s): LABPROT, INR in the last 72 hours. ABG No results for input(s): PHART, HCO3 in the last 72 hours.  Invalid input(s): PCO2, PO2  Studies/Results:  Anti-infectives: Anti-infectives (From admission, onward)    Start     Dose/Rate Route Frequency Ordered Stop   01/09/21 1400  neomycin (MYCIFRADIN) tablet 1,000 mg  Status:  Discontinued       See Hyperspace for full Linked Orders Report.   1,000 mg Oral 3 times per day 01/09/21 1041 01/09/21 1047   01/09/21 1400  metroNIDAZOLE (FLAGYL) tablet 1,000 mg  Status:  Discontinued       See Hyperspace for full Linked Orders Report.   1,000 mg Oral 3 times per day 01/09/21 1041 01/09/21 1047   01/09/21 0600  cefoTEtan (CEFOTAN) 2 g in sodium chloride 0.9 % 100 mL IVPB        2  g 200 mL/hr over 30 Minutes Intravenous On call to O.R. 01/08/21 0724 01/09/21 1240        Assessment/Plan: Patient Active Problem List   Diagnosis Date Noted   Colon cancer ([employer]) 01/09/2021   Atrial fibrillation ([place]) 10/21/2020   Coronary artery disease involving native coronary artery of native heart without angina pectoris 10/21/2020   Obstructive sleep apnea syndrome 10/21/2020   PAF (paroxysmal atrial fibrillation) ([place]) 04/15/2020   Minimal CAD 04/15/2020   Chronic diastolic heart failure ([place]) 04/15/2020   Chronic cough    Persistent atrial fibrillation ([place]) 08/24/2019   Essential hypertension 08/24/2019   Mixed hyperlipidemia 08/24/2019   Shortness of breath 08/24/2019   Bilateral leg edema 08/24/2019   Obesity (BMI 30-39.9) 08/24/2019   Chronic pain of left knee 12/01/2015   s/p Procedure(s): XI ROBOTIC ASSISTED RIGHT HEMI COLECTOMY, LYSIS OF ADHESIONS, SMALL BOWEL RESECTION, INTRAOPERATIVE ASSESMENT OF PERFUSION, PARTIAL OMENTECTOMY 01/09/2021  -We have spent time reviewing his procedure, findings and plans moving forward -Tolerating diet, having reliable bowel function, pain well controlled, mobilizing well on his own -Hgb stable, up some today. No further black stool - brown in color. Restart Xarelto, D/C prophylactic SQH -We will plan to see back in office in 2-3 weeks for postop follow-up or sooner if necessary -PPX: on Eliquis now; SCDs  LOS: 3 days   Nadeen Landau, MD [facility] Use [email or web address] to contact on call provider

## 2021-01-13 NOTE — Progress Notes (Signed)
Subjective No acute events. Having flatus and Bms. Denies n/v. Denies distention. Over the weekend had some blackish Bms. Those have since become more formed and brown. Denies abdominal pain at present; some soreness about his extraction incision.  Objective: Vital signs in last 24 hours: Temp:  [98.2 F (36.8 C)] 98.2 F (36.8 C) (07/19 0604) Pulse Rate:  [66-69] 66 (07/19 0604) Resp:  [18] 18 (07/19 0604) BP: (139-156)/(70-74) 139/74 (07/19 0604) SpO2:  [97 %-100 %] 97 % (07/19 0604) Weight:  [440.3 kg] 115.2 kg (07/19 0448) Last BM Date: 01/11/21  Intake/Output from previous day: 07/18 0701 - 07/19 0700 In: 1680 [P.O.:1680] Out: -  Intake/Output this shift: Total I/O In: 120 [P.O.:120] Out: -   Gen: NAD, comfortable CV: RRR Pulm: Normal work of breathing Abd: Soft, NT/ND. Incisions are c/d/I without erythema or drainage Ext: SCDs in place  Lab Results: CBC  Recent Labs    01/11/21 0419 01/12/21 0408  WBC 13.3* 11.0*  HGB 11.3* 11.5*  HCT 35.7* 36.3*  PLT 208 242   BMET Recent Labs    01/11/21 0419 01/12/21 0408  NA 138 138  K 4.1 4.1  CL 104 102  CO2 28 28  GLUCOSE 116* 135*  BUN 13 13  CREATININE 0.90 0.89  CALCIUM 8.4* 8.7*   PT/INR No results for input(s): LABPROT, INR in the last 72 hours. ABG No results for input(s): PHART, HCO3 in the last 72 hours.  Invalid input(s): PCO2, PO2  Studies/Results:  Anti-infectives: Anti-infectives (From admission, onward)    Start     Dose/Rate Route Frequency Ordered Stop   01/09/21 1400  neomycin (MYCIFRADIN) tablet 1,000 mg  Status:  Discontinued       See Hyperspace for full Linked Orders Report.   1,000 mg Oral 3 times per day 01/09/21 1041 01/09/21 1047   01/09/21 1400  metroNIDAZOLE (FLAGYL) tablet 1,000 mg  Status:  Discontinued       See Hyperspace for full Linked Orders Report.   1,000 mg Oral 3 times per day 01/09/21 1041 01/09/21 1047   01/09/21 0600  cefoTEtan (CEFOTAN) 2 g in sodium  chloride 0.9 % 100 mL IVPB        2 g 200 mL/hr over 30 Minutes Intravenous On call to O.R. 01/08/21 0724 01/09/21 1240        Assessment/Plan: Patient Active Problem List   Diagnosis Date Noted   Colon cancer ([place] ) 01/09/2021   Atrial fibrillation ([place]) 10/21/2020   Coronary artery disease involving native coronary artery of native heart without angina pectoris 10/21/2020   Obstructive sleep apnea syndrome 10/21/2020   PAF (paroxysmal atrial fibrillation) ([place]) 04/15/2020   Minimal CAD 04/15/2020   Chronic diastolic heart failure ([place]) 04/15/2020   Chronic cough    Persistent atrial fibrillation ([place]) 08/24/2019   Essential hypertension 08/24/2019   Mixed hyperlipidemia 08/24/2019   Shortness of breath 08/24/2019   Bilateral leg edema 08/24/2019   Obesity (BMI 30-39.9) 08/24/2019   Chronic pain of left knee 12/01/2015   s/p Procedure(s): XI ROBOTIC ASSISTED RIGHT HEMI COLECTOMY, LYSIS OF ADHESIONS, SMALL BOWEL RESECTION, INTRAOPERATIVE ASSESMENT OF PERFUSION, PARTIAL OMENTECTOMY 01/09/2021  -Tolerating diet, continues with good bowel function, pain well controlled, mobilizing well on his own -No blood in stool on anticoagulation -We will plan to see back in office in 2-3 weeks for postop follow-up or sooner if necessary -PPX: on Eliquis now; SCDs   LOS: 4 days   Nadeen Landau, MD [facility] Use  [email or web address] to contact on call provider

## 2021-01-13 NOTE — Progress Notes (Signed)
Patient was given discharge instructions, and all questions were answered.  Patient was stable for discharge and was taken to the main exit by wheelchair. 

## 2021-01-14 NOTE — Discharge Summary (Signed)
Patient ID: Thomas Knight MRN: [id number] DOB/AGE: Mar 06, 1946 75 y.o.  Admit date: 01/09/2021 Discharge date: 01/14/2021  Discharge Diagnoses Patient Active Problem List   Diagnosis Date Noted   Colon cancer ([place]) 01/09/2021   Atrial fibrillation ([place]) 10/21/2020   Coronary artery disease involving native coronary artery of native heart without angina pectoris 10/21/2020   Obstructive sleep apnea syndrome 10/21/2020   PAF (paroxysmal atrial fibrillation) ([place]) 04/15/2020   Minimal CAD 04/15/2020   Chronic diastolic heart failure ([place]) 04/15/2020   Chronic cough    Persistent atrial fibrillation ([place]) 08/24/2019   Essential hypertension 08/24/2019   Mixed hyperlipidemia 08/24/2019   Shortness of breath 08/24/2019   Bilateral leg edema 08/24/2019   Obesity (BMI 30-39.9) 08/24/2019   Chronic pain of left knee 12/01/2015    Consultants None  Procedures OR 01/09/21 Robotic-assisted right hemicolectomy Small bowel resection Excision of involved mesh 4 x 2 cm Partial omentectomy Intraoperative assessment of perfusion Bilateral transversus abdominus plane blocks  Hospital Course: He was admitted postoperatively where he recovered well. He had some degree of expected postoperative ileus and his diet was then gradually advanced. On 01/13/21 he was noted to be reliably tolerating a diet, ambulating, having bowel function and pain well controlled on oral analgesics. He was comfortable with and stable for discharge home.  Pathology returned with moderately differentiated adenocarcinoma, pT3N0 (Stage II colon cancer). This was reviewed over the phone with him. Follow-up with me has been arranged.    Allergies as of 01/13/2021   No Known Allergies      Medication List     TAKE these medications    D3-1000 25 MCG (1000 UT) capsule Generic drug: Cholecalciferol Take 1,000 Units by mouth daily.   Eliquis 5 MG Tabs tablet Generic drug: apixaban Take 1 tablet (5 mg total) by mouth  2 (two) times daily.   ferrous sulfate 325 (65 FE) MG tablet Take 325 mg by mouth 3 (three) times a week.   furosemide 40 MG tablet Commonly known as: LASIX Take 1 tablet (40 mg total) by mouth as needed. What changed:  when to take this reasons to take this   metoprolol tartrate 25 MG tablet Commonly known as: LOPRESSOR Take 1 tablet (25 mg total) by mouth 2 (two) times daily.   nitroGLYCERIN 0.4 MG SL tablet Commonly known as: NITROSTAT Place 1 tablet (0.4 mg total) under the tongue every 5 (five) minutes as needed.   OVER THE COUNTER MEDICATION Take 1 tablet by mouth daily. Instaflex featuring UC-II Collagen/ Relieves discomfort, improves flexibility   potassium chloride SA 20 MEQ tablet Commonly known as: Klor-Con M20 Take 1 tablet (20 mEq total) by mouth daily. What changed:  when to take this reasons to take this   traMADol 50 MG tablet Commonly known as: Ultram Take 1 tablet (50 mg total) by mouth every 6 (six) hours as needed for up to 5 days (postop pain not controlled with tylenol and ibuprofen first).   vitamin B-12 1000 MCG tablet Commonly known as: CYANOCOBALAMIN Take 1,000 mcg by mouth daily.   vitamin C with rose hips 500 MG tablet Take 500 mg by mouth daily.   Zinc 50 MG Caps Take by mouth.          Follow-up Information     Ileana Roup, MD Follow up in 2 week(s).   Specialties: General Surgery, Colon and Rectal Surgery Why: 2-3 weeks for postop follow-up Contact information: [place] [address] [phone]  Sharon Mt. Dema Severin, M.D. [facility]

## 2021-01-15 LAB — SURGICAL PATHOLOGY

## 2021-01-21 ENCOUNTER — Other Ambulatory Visit: Payer: Self-pay

## 2021-01-21 NOTE — Progress Notes (Signed)
The proposed treatment discussed in conference is for discussion purpose only and is not a binding recommendation.  The patients have not been physically examined, or presented with their treatment options.  Therefore, final treatment plans cannot be decided.  

## 2021-01-30 ENCOUNTER — Other Ambulatory Visit: Payer: Self-pay

## 2021-01-30 MED ORDER — NITROGLYCERIN 0.4 MG SL SUBL: 0.4000 mg | SUBLINGUAL_TABLET | SUBLINGUAL | 7 refills | Status: DC | PRN

## 2021-01-30 NOTE — Telephone Encounter (Signed)
Refill of Nitroglycerin 0.4 mg sent to [facility].

## 2021-02-09 DIAGNOSIS — E041 Nontoxic single thyroid nodule: Secondary | ICD-10-CM | POA: Diagnosis not present

## 2021-04-20 DIAGNOSIS — I10 Essential (primary) hypertension: Secondary | ICD-10-CM | POA: Diagnosis not present

## 2021-04-20 DIAGNOSIS — Z23 Encounter for immunization: Secondary | ICD-10-CM | POA: Diagnosis not present

## 2021-04-20 DIAGNOSIS — N400 Enlarged prostate without lower urinary tract symptoms: Secondary | ICD-10-CM | POA: Diagnosis not present

## 2021-04-20 DIAGNOSIS — Z6837 Body mass index (BMI) 37.0-37.9, adult: Secondary | ICD-10-CM | POA: Diagnosis not present

## 2021-04-20 DIAGNOSIS — Z85038 Personal history of other malignant neoplasm of large intestine: Secondary | ICD-10-CM | POA: Diagnosis not present

## 2021-05-05 ENCOUNTER — Other Ambulatory Visit: Payer: Self-pay

## 2021-05-05 ENCOUNTER — Ambulatory Visit (INDEPENDENT_AMBULATORY_CARE_PROVIDER_SITE_OTHER): Payer: Medicare HMO | Admitting: Cardiology

## 2021-05-05 ENCOUNTER — Encounter: Payer: Self-pay | Admitting: Cardiology

## 2021-05-05 VITALS — BP 140/80 | HR 66 | Ht 69.0 in | Wt 254.0 lb

## 2021-05-05 DIAGNOSIS — E782 Mixed hyperlipidemia: Secondary | ICD-10-CM

## 2021-05-05 DIAGNOSIS — I10 Essential (primary) hypertension: Secondary | ICD-10-CM

## 2021-05-05 DIAGNOSIS — G4733 Obstructive sleep apnea (adult) (pediatric): Secondary | ICD-10-CM

## 2021-05-05 DIAGNOSIS — I2510 Atherosclerotic heart disease of native coronary artery without angina pectoris: Secondary | ICD-10-CM | POA: Diagnosis not present

## 2021-05-05 DIAGNOSIS — I5032 Chronic diastolic (congestive) heart failure: Secondary | ICD-10-CM

## 2021-05-05 DIAGNOSIS — I480 Paroxysmal atrial fibrillation: Secondary | ICD-10-CM | POA: Diagnosis not present

## 2021-05-05 NOTE — Patient Instructions (Signed)
Medication Instructions:  Your physician recommends that you continue on your current medications as directed. Please refer to the Current Medication list given to you today.  *If you need a refill on your cardiac medications before your next appointment, please call your pharmacy*   Lab Work: None If you have labs (blood work) drawn today and your tests are completely normal, you will receive your results only by: [place] (if you have MyChart) OR A paper copy in the mail If you have any lab test that is abnormal or we need to change your treatment, we will call you to review the results.   Testing/Procedures: None   Follow-Up: At [facility], you and your health needs are our priority.  As part of our continuing mission to provide you with exceptional heart care, we have created designated Provider Care Teams.  These Care Teams include your primary Cardiologist (physician) and Advanced Practice Providers (APPs -  Physician Assistants and Nurse Practitioners) who all work together to provide you with the care you need, when you need it.  We recommend signing up for the patient portal called "MyChart".  Sign up information is provided on this After Visit Summary.  MyChart is used to connect with patients for Virtual Visits (Telemedicine).  Patients are able to view lab/test results, encounter notes, upcoming appointments, etc.  Non-urgent messages can be sent to your provider as well.   To learn more about what you can do with MyChart, go to [email or web address].    Your next appointment:   1 year(s)  The format for your next appointment:   In Person  Provider:   Berniece Salines, DO

## 2021-05-05 NOTE — Progress Notes (Signed)
Cardiology Office Note:    Date:  05/06/2021   ID:  Thomas Knight, DOB [birth year], MRN [id number]  PCP:  Angelina Sheriff, MD  Cardiologist:  Berniece Salines, DO  Electrophysiologist:  None   Referring MD: Angelina Sheriff, MD   " I am doing well"  History of Present Illness:    Thomas Knight is a 75 y.o. male with a hx of minimal coronary artery disease seen on coronary CTA, paroxysmal atrial fibrillation on Eliquis twice daily and metoprolol 25 mg twice a day, he is status post cardioversion and remains in sinus rhythm on his metoprolol.  He had declined use of antiarrhythmic.  History of COVID-19 infection back in March 2021.  Other includes hypertension, hyperlipidemia OSA .   When I last saw the patient October 2021 at that time he appeared to be doing well from a cardiovascular standpoint.  We discussed his report from his CTA which show minimal CAD as well as we talked about managing his paroxysmal atrial fibrillation on his medication regimen which included beta-blocker as well as Eliquis 5 mg twice a day.  I saw the patient on October 21, 2020 at that time he had a planned colonoscopy at [facility].  He had just been diagnosed with sleep apnea and was pending his sleep study.  Since I saw the patient underwent first colonoscopy with Dr. Benson Norway 10/2020. He was found to have a mass in his transverse colon that was biopsied. This showed mod diff adenoCA. 71mm desc colon polyp showed adenomatous polyp with HGD. CT CAP demonstrated an apple core mass in his transverse colon appears to be in the proximal half of it measuring 4.6 cm in size. No local regional adenopathy apparent on the CAT scan. No findings of metastatic disease.  Today he offers no complaints.    Past Medical History:  Diagnosis Date   Bilateral leg edema 08/24/2019   Cancer ([place])    skin ca, colon   Chronic cough    Chronic pain of left knee 12/01/2015   Dysrhythmia    afib  was cardioverted   Essential  hypertension 08/24/2019   Mixed hyperlipidemia 08/24/2019   Obesity (BMI 30-39.9) 08/24/2019   Persistent atrial fibrillation ([place]) 08/24/2019    Past Surgical History:  Procedure Laterality Date   ACHILLES TENDON REPAIR Left 2012   Ruptured   HERNIA REPAIR     umbilical hernia   SKIN SURGERY Left    Basal cell carcinoma on left forearm   XI ROBOTIC ASSISTED LOWER ANTERIOR RESECTION N/A 01/09/2021   Procedure: XI ROBOTIC ASSISTED RIGHT HEMI COLECTOMY, LYSIS OF ADHESIONS, SMALL BOWEL RESECTION, INTRAOPERATIVE ASSESMENT OF PERFUSION, PARTIAL OMENTECTOMY;  Surgeon: Ileana Roup, MD;  Location: [facility];  Service: General;  Laterality: N/A;    Current Medications: Current Meds  Medication Sig   Ascorbic Acid (VITAMIN C WITH ROSE HIPS) 500 MG tablet Take 500 mg by mouth daily.   Cholecalciferol (D3-1000) 25 MCG (1000 UT) capsule Take 1,000 Units by mouth daily.   ELIQUIS 5 MG TABS tablet Take 1 tablet (5 mg total) by mouth 2 (two) times daily.   metoprolol tartrate (LOPRESSOR) 25 MG tablet Take 1 tablet (25 mg total) by mouth 2 (two) times daily.   OVER THE COUNTER MEDICATION Take 1 tablet by mouth daily. Instaflex featuring UC-II Collagen/ Relieves discomfort, improves flexibility   sildenafil (REVATIO) 20 MG tablet Take 40-100 mg by mouth daily as needed.   vitamin B-12 (CYANOCOBALAMIN) 1000  MCG tablet Take 1,000 mcg by mouth daily.   Zinc 50 MG CAPS Take by mouth.     Allergies:   Patient has no known allergies.   Social History   Socioeconomic History   Marital status: Married    Spouse name: Not on file   Number of children: Not on file   Years of education: Not on file   Highest education level: Not on file  Occupational History   Not on file  Tobacco Use   Smoking status: Never   Smokeless tobacco: Never  Vaping Use   Vaping Use: Never used  Substance and Sexual Activity   Alcohol use: Never   Drug use: Never   Sexual activity: Not Currently  Other Topics  Concern   Not on file  Social History Narrative   Not on file   Social Determinants of Health   Financial Resource Strain: Not on file  Food Insecurity: Not on file  Transportation Needs: Not on file  Physical Activity: Not on file  Stress: Not on file  Social Connections: Not on file     Family History: The patient's family history includes Lung cancer in his father.  ROS:   Review of Systems  Constitution: Negative for decreased appetite, fever and weight gain.  HENT: Negative for congestion, ear discharge, hoarse voice and sore throat.   Eyes: Negative for discharge, redness, vision loss in right eye and visual halos.  Cardiovascular: Negative for chest pain, dyspnea on exertion, leg swelling, orthopnea and palpitations.  Respiratory: Negative for cough, hemoptysis, shortness of breath and snoring.   Endocrine: Negative for heat intolerance and polyphagia.  Hematologic/Lymphatic: Negative for bleeding problem. Does not bruise/bleed easily.  Skin: Negative for flushing, nail changes, rash and suspicious lesions.  Musculoskeletal: Negative for arthritis, joint pain, muscle cramps, myalgias, neck pain and stiffness.  Gastrointestinal: Negative for abdominal pain, bowel incontinence, diarrhea and excessive appetite.  Genitourinary: Negative for decreased libido, genital sores and incomplete emptying.  Neurological: Negative for brief paralysis, focal weakness, headaches and loss of balance.  Psychiatric/Behavioral: Negative for altered mental status, depression and suicidal ideas.  Allergic/Immunologic: Negative for HIV exposure and persistent infections.    EKGs/Labs/Other Studies Reviewed:    The following studies were reviewed today:   EKG: None today   Recent Labs: 12/23/2020: ALT 23 01/12/2021: BUN 13; Creatinine, Ser 0.89; Hemoglobin 11.5; Platelets 242; Potassium 4.1; Sodium 138  Recent Lipid Panel No results found for: CHOL, TRIG, HDL, CHOLHDL, VLDL, LDLCALC,  LDLDIRECT  Physical Exam:    VS:  BP 140/80 (BP Location: Left Arm)   Pulse 66   Ht 5\' 9"  (1.753 m)   Wt 254 lb (115.2 kg)   SpO2 97%   BMI 37.51 kg/m     Wt Readings from Last 3 Encounters:  05/05/21 254 lb (115.2 kg)  01/13/21 253 lb 15.5 oz (115.2 kg)  12/23/20 258 lb (117 kg)     GEN: Well nourished, well developed in no acute distress HEENT: Normal NECK: No JVD; No carotid bruits LYMPHATICS: No lymphadenopathy CARDIAC: S1S2 noted,RRR, no murmurs, rubs, gallops RESPIRATORY:  Clear to auscultation without rales, wheezing Knight rhonchi  ABDOMEN: Soft, non-tender, non-distended, +bowel sounds, no guarding. EXTREMITIES: No edema, No cyanosis, no clubbing MUSCULOSKELETAL:  No deformity  SKIN: Warm and dry NEUROLOGIC:  Alert and oriented x 3, non-focal PSYCHIATRIC:  Normal affect, good insight  ASSESSMENT:    1. PAF (paroxysmal atrial fibrillation) ([place])   2. Essential hypertension   3. Chronic  diastolic heart failure ([place])   4. Minimal CAD   5. Obstructive sleep apnea syndrome   6. Mixed hyperlipidemia    PLAN:     He appears to be doing well from a cardiovascular standpoint.  We will continue his current medication regimen. Blood pressure is acceptable, continue with current antihypertensive regimen. hyperlipidemia - continue with current statin medication. The patient understands the need to lose weight with diet and exercise. We have discussed specific strategies for this. No anginal symptoms. The patient is inquiring about transitioning his care to new provider in [place] office.  He plans to see Dr. Bettina Gavia.  The patient is in agreement with the above plan. The patient left the office in stable condition.  The patient will follow up in 1 year Knight sooner if needed.   Medication Adjustments/Labs and Tests Ordered: Current medicines are reviewed at length with the patient today.  Concerns regarding medicines are outlined above.  No orders of the defined types were  placed in this encounter.  No orders of the defined types were placed in this encounter.   Patient Instructions  Medication Instructions:  Your physician recommends that you continue on your current medications as directed. Please refer to the Current Medication list given to you today.  *If you need a refill on your cardiac medications before your next appointment, please call your pharmacy*   Lab Work: None If you have labs (blood work) drawn today and your tests are completely normal, you will receive your results only by: [place] (if you have MyChart) Knight A paper copy in the mail If you have any lab test that is abnormal Knight we need to change your treatment, we will call you to review the results.   Testing/Procedures: None   Follow-Up: At [facility], you and your health needs are our priority.  As part of our continuing mission to provide you with exceptional heart care, we have created designated Provider Care Teams.  These Care Teams include your primary Cardiologist (physician) and Advanced Practice Providers (APPs -  Physician Assistants and Nurse Practitioners) who all work together to provide you with the care you need, when you need it.  We recommend signing up for the patient portal called "MyChart".  Sign up information is provided on this After Visit Summary.  MyChart is used to connect with patients for Virtual Visits (Telemedicine).  Patients are able to view lab/test results, encounter notes, upcoming appointments, etc.  Non-urgent messages can be sent to your provider as well.   To learn more about what you can do with MyChart, go to [email or web address].    Your next appointment:   1 year(s)  The format for your next appointment:   In Person  Provider:   Berniece Salines, DO      Adopting a Healthy Lifestyle.  Know what a healthy weight is for you (roughly BMI <25) and aim to maintain this   Aim for 7+ servings of fruits and vegetables daily    65-80+ fluid ounces of water Knight unsweet tea for healthy kidneys   Limit to max 1 drink of alcohol per day; avoid smoking/tobacco   Limit animal fats in diet for cholesterol and heart health - choose grass fed whenever available   Avoid highly processed foods, and foods high in saturated/trans fats   Aim for low stress - take time to unwind and care for your mental health   Aim for 150 min of moderate intensity exercise weekly for heart health,  and weights twice weekly for bone health   Aim for 7-9 hours of sleep daily   When it comes to diets, agreement about the perfect plan isnt easy to find, even among the experts. Experts at the [employer] developed an idea known as the Healthy Eating Plate. Just imagine a plate divided into logical, healthy portions.   The emphasis is on diet quality:   Load up on vegetables and fruits - one-half of your plate: Aim for color and variety, and remember that potatoes dont count.   Go for whole grains - one-quarter of your plate: Whole wheat, barley, wheat berries, quinoa, oats, brown rice, and foods made with them. If you want pasta, go with whole wheat pasta.   Protein power - one-quarter of your plate: Fish, chicken, beans, and nuts are all healthy, versatile protein sources. Limit red meat.   The diet, however, does go beyond the plate, offering a few other suggestions.   Use healthy plant oils, such as olive, canola, soy, corn, sunflower and peanut. Check the labels, and avoid partially hydrogenated oil, which have unhealthy trans fats.   If youre thirsty, drink water. Coffee and tea are good in moderation, but skip sugary drinks and limit milk and dairy products to one Knight two daily servings.   The type of carbohydrate in the diet is more important than the amount. Some sources of carbohydrates, such as vegetables, fruits, whole grains, and beans-are healthier than others.   Finally, stay active  Signed, Berniece Salines, DO  05/06/2021 8:37 AM    [facility]

## 2021-05-11 ENCOUNTER — Other Ambulatory Visit: Payer: Self-pay

## 2021-05-11 MED ORDER — ELIQUIS 5 MG PO TABS: 5.0000 mg | ORAL_TABLET | Freq: Two times a day (BID) | ORAL | 1 refills | Status: DC

## 2021-06-24 DIAGNOSIS — Z85038 Personal history of other malignant neoplasm of large intestine: Secondary | ICD-10-CM | POA: Diagnosis not present

## 2021-07-22 DIAGNOSIS — Z85038 Personal history of other malignant neoplasm of large intestine: Secondary | ICD-10-CM | POA: Diagnosis not present

## 2021-07-22 DIAGNOSIS — R351 Nocturia: Secondary | ICD-10-CM | POA: Diagnosis not present

## 2021-08-10 ENCOUNTER — Telehealth: Payer: Self-pay

## 2021-08-10 ENCOUNTER — Telehealth: Payer: Self-pay | Admitting: Cardiology

## 2021-08-10 ENCOUNTER — Other Ambulatory Visit: Payer: Self-pay

## 2021-08-10 MED ORDER — METOPROLOL TARTRATE 25 MG PO TABS: 25.0000 mg | ORAL_TABLET | Freq: Two times a day (BID) | ORAL | 2 refills | Status: DC

## 2021-08-10 NOTE — Telephone Encounter (Signed)
°*  STAT* If patient is at the pharmacy, call can be transferred to refill team.   1. Which medications need to be refilled? (please list name of each medication and dose if known) Metoprolol  2. Which pharmacy/location (including street and city if local pharmacy) is medication to be sent to?[facility] [place]  3. Do they need a 30 day or 90 day supply? 90 days and refill- need this call in today if possible please

## 2021-08-10 NOTE — Telephone Encounter (Signed)
Called patient to advise refill sent to pharmacy.

## 2021-08-26 DIAGNOSIS — R351 Nocturia: Secondary | ICD-10-CM | POA: Diagnosis not present

## 2021-08-26 DIAGNOSIS — R3915 Urgency of urination: Secondary | ICD-10-CM | POA: Diagnosis not present

## 2021-08-26 DIAGNOSIS — N401 Enlarged prostate with lower urinary tract symptoms: Secondary | ICD-10-CM | POA: Diagnosis not present

## 2021-08-26 DIAGNOSIS — N5201 Erectile dysfunction due to arterial insufficiency: Secondary | ICD-10-CM | POA: Diagnosis not present

## 2021-09-25 DIAGNOSIS — E785 Hyperlipidemia, unspecified: Secondary | ICD-10-CM | POA: Diagnosis not present

## 2021-09-25 DIAGNOSIS — I10 Essential (primary) hypertension: Secondary | ICD-10-CM | POA: Diagnosis not present

## 2021-10-19 DIAGNOSIS — Z85038 Personal history of other malignant neoplasm of large intestine: Secondary | ICD-10-CM | POA: Diagnosis not present

## 2021-10-19 DIAGNOSIS — E6609 Other obesity due to excess calories: Secondary | ICD-10-CM | POA: Diagnosis not present

## 2021-10-19 DIAGNOSIS — I482 Chronic atrial fibrillation, unspecified: Secondary | ICD-10-CM | POA: Diagnosis not present

## 2021-10-20 ENCOUNTER — Telehealth: Payer: Self-pay | Admitting: Cardiology

## 2021-10-20 NOTE — Telephone Encounter (Signed)
? ?  Pre-operative Risk Assessment  ?  ?Patient Name: Thomas Knight  ?DOB: December 27, 1945 ?MRN: [id number]  ? ?  ? ?Request for Surgical Clearance   ? ?Procedure:   colonoscopy  ? ?Date of Surgery:  Clearance 11/19/21                              ?   ?Surgeon:  Dr. Carol Ada ?Surgeon's Group or Practice Name:  [facility] ?Phone number:  [phone] ?Fax number:  [phone] ?  ?Type of Clearance Requested:   ?- Medical  ?- Pharmacy:  Hold Apixaban (Eliquis) # of days not specified  ?  ?Type of Anesthesia:   Propofol  ?  ?Additional requests/questions:   n/a ? ?Signed, ?Sheral Apley M   ?10/20/2021, 2:17 PM  ? ?

## 2021-10-20 NOTE — Telephone Encounter (Addendum)
Patient with diagnosis of PAF on Eliquis for anticoagulation.   ? ?Procedure: colonoscopy ?Date of procedure: 11/19/21 ? ? ?CHA2DS2-VASc Score = 5  ?This indicates a 7.2% annual risk of stroke. ?The patient's score is based upon: ?CHF History: 1 ?HTN History: 1 ?Diabetes History: 0 ?Stroke History: 0 ?Vascular Disease History: 1 ?Age Score: 2 ?Gender Score: 0 ?  ?CrCl 89 ml/min ?Platelet count 242K ? ?Per office protocol, patient can hold Eliquis for 2 days prior to procedure.   ?

## 2021-11-17 ENCOUNTER — Other Ambulatory Visit: Payer: Self-pay | Admitting: Gastroenterology

## 2021-11-17 ENCOUNTER — Ambulatory Visit (INDEPENDENT_AMBULATORY_CARE_PROVIDER_SITE_OTHER): Payer: Medicare HMO | Admitting: Nurse Practitioner

## 2021-11-17 ENCOUNTER — Telehealth: Payer: Self-pay

## 2021-11-17 ENCOUNTER — Encounter: Payer: Self-pay | Admitting: Nurse Practitioner

## 2021-11-17 VITALS — BP 164/72 | HR 57

## 2021-11-17 DIAGNOSIS — Z01810 Encounter for preprocedural cardiovascular examination: Secondary | ICD-10-CM

## 2021-11-17 MED ORDER — METOPROLOL TARTRATE 25 MG PO TABS: 25.0000 mg | ORAL_TABLET | Freq: Two times a day (BID) | ORAL | 3 refills | Status: DC

## 2021-11-17 NOTE — Progress Notes (Signed)
Virtual Visit via Telephone Note   Because of Thomas Knight's co-morbid illnesses, he is at least at moderate risk for complications without adequate follow up.  This format is felt to be most appropriate for this patient at this time.  The patient did not have access to video technology/had technical difficulties with video requiring transitioning to audio format only (telephone).  All issues noted in this document were discussed and addressed.  No physical exam could be performed with this format.  Please refer to the patient's chart for his consent to telehealth for Thomas Knight.  Evaluation Performed:  Preoperative cardiovascular risk assessment _____________   Date:  11/17/2021   Patient ID:  Thomas Knight, DOB [birth year], MRN [id number] Patient Location:  Home Provider location:   Office  Primary Care Provider:  Angelina Sheriff, MD Primary Cardiologist:  Thomas Salines, DO  Chief Complaint / Patient Profile   76 y.o. y/o male with a h/o minimal CAD seen on coronary CTA, PAF on chronic anticoagulation, hypertension, hyperlipidemia, OSA who is pending colonoscopy and presents today for telephonic preoperative cardiovascular risk assessment.  Past Medical History    Past Medical History:  Diagnosis Date   Bilateral leg edema 08/24/2019   Cancer ([place])    skin ca, colon   Chronic cough    Chronic pain of left knee 12/01/2015   Dysrhythmia    afib  was cardioverted   Essential hypertension 08/24/2019   Mixed hyperlipidemia 08/24/2019   Obesity (BMI 30-39.9) 08/24/2019   Persistent atrial fibrillation ([place]) 08/24/2019   Past Surgical History:  Procedure Laterality Date   ACHILLES TENDON REPAIR Left 2012   Ruptured   HERNIA REPAIR     umbilical hernia   SKIN SURGERY Left    Basal cell carcinoma on left forearm   XI ROBOTIC ASSISTED LOWER ANTERIOR RESECTION N/A 01/09/2021   Procedure: XI ROBOTIC ASSISTED RIGHT HEMI COLECTOMY, LYSIS OF ADHESIONS, SMALL BOWEL RESECTION,  INTRAOPERATIVE ASSESMENT OF PERFUSION, PARTIAL OMENTECTOMY;  Surgeon: Ileana Roup, MD;  Location: [facility];  Service: General;  Laterality: N/A;    Allergies  No Known Allergies  History of Present Illness    Thomas Knight is a 76 y.o. male who presents via Engineer, civil (consulting) for a telehealth visit today.  Pt was last seen in cardiology clinic on 05/05/21 by Dr. Harriet Knight.  At that time [facility] was doing well.  The patient is now pending procedure as outlined above. Since his last visit, he  denies chest pain, shortness of breath, lower extremity edema, fatigue, palpitations, melena, hematuria, hemoptysis, diaphoresis, weakness, presyncope, syncope, orthopnea, and PND.   Home Medications    Prior to Admission medications   Medication Sig Start Date End Date Taking? Authorizing Provider  Ascorbic Acid (VITAMIN C WITH ROSE HIPS) 500 MG tablet Take 500 mg by mouth daily.    [provider]  Cholecalciferol (D3-1000) 25 MCG (1000 UT) capsule Take 1,000 Units by mouth daily.    [provider]  ELIQUIS 5 MG TABS tablet Take 1 tablet (5 mg total) by mouth 2 (two) times daily. 05/11/21   Tobb, Kardie, DO  ferrous sulfate 325 (65 FE) MG tablet Take 325 mg by mouth 3 (three) times a week.    [provider]  furosemide (LASIX) 40 MG tablet Take 1 tablet (40 mg total) by mouth as needed. Patient taking differently: Take 40 mg by mouth daily as needed for fluid. 04/15/20 12/16/20  Tobb, Godfrey Pick, DO  metoprolol tartrate (LOPRESSOR)  25 MG tablet Take 1 tablet (25 mg total) by mouth 2 (two) times daily. 11/17/21   Tobb, Kardie, DO  nitroGLYCERIN (NITROSTAT) 0.4 MG SL tablet Place 1 tablet (0.4 mg total) under the tongue every 5 (five) minutes as needed. 01/30/21 04/30/21  Tobb, Kardie, DO  OVER THE COUNTER MEDICATION Take 1 tablet by mouth daily. Instaflex featuring UC-II Collagen/ Relieves discomfort, improves flexibility    [provider]  potassium chloride  SA (KLOR-CON M20) 20 MEQ tablet Take 1 tablet (20 mEq total) by mouth daily. Patient taking differently: Take 20 mEq by mouth daily as needed (when taking furosemide). 03/13/20 12/16/20  Tobb, Kardie, DO  sildenafil (REVATIO) 20 MG tablet Take 40-100 mg by mouth daily as needed. 04/28/21   [provider]  vitamin B-12 (CYANOCOBALAMIN) 1000 MCG tablet Take 1,000 mcg by mouth daily.    [provider]  Zinc 50 MG CAPS Take by mouth.    [provider]    Physical Exam    Vital Signs:  Thomas Knight does have vital signs available for review today.  Given telephonic nature of communication, physical exam is limited. AAOx3. NAD. Normal affect.  Speech and respirations are unlabored.  Accessory Clinical Findings    None  Assessment & Plan    1.  Preoperative Cardiovascular Risk Assessment: He is doing well from a cardiac perspective and may proceed to procedure without further cardiac testing.  According to the Revised Cardiac Risk Index (RCRI), his Perioperative Risk of Major Cardiac Event is (%): 0.4. His Functional Capacity in METs is: 7.59 according to the Duke Activity Status Index (DASI). Guidelines for holding anticoagulation have been reviewed by clinical pharmacist. Patient may hold Eliquis for 2 days prior to procedure.   A copy of this note will be routed to requesting surgeon.  Time:   Today, I have spent 10 minutes with the patient with telehealth technology discussing medical history, symptoms, and management plan.    Thomas Life, NP-C    11/17/2021, 9:57 AM [place] [address], [address] Office [phone] Fax [phone]

## 2021-11-17 NOTE — Telephone Encounter (Signed)
  Patient Consent for Virtual Visit        Thomas Knight has provided verbal consent on 11/17/2021 for a virtual visit (video or telephone).   CONSENT FOR VIRTUAL VISIT FOR:  Thomas Knight  By participating in this virtual visit I agree to the following:  I hereby voluntarily request, consent and authorize [facility] and its employed or contracted physicians, Engineer, materials, nurse practitioners or other licensed health care professionals (the Practitioner), to provide me with telemedicine health care services (the "Services") as deemed necessary by the treating Practitioner. I acknowledge and consent to receive the Services by the Practitioner via telemedicine. I understand that the telemedicine visit will involve communicating with the Practitioner through live audiovisual communication technology and the disclosure of certain medical information by electronic transmission. I acknowledge that I have been given the opportunity to request an in-person assessment or other available alternative prior to the telemedicine visit and am voluntarily participating in the telemedicine visit.  I understand that I have the right to withhold or withdraw my consent to the use of telemedicine in the course of my care at any time, without affecting my right to future care or treatment, and that the Practitioner or I may terminate the telemedicine visit at any time. I understand that I have the right to inspect all information obtained and/or recorded in the course of the telemedicine visit and may receive copies of available information for a reasonable fee.  I understand that some of the potential risks of receiving the Services via telemedicine include:  Delay or interruption in medical evaluation due to technological equipment failure or disruption; Information transmitted may not be sufficient (e.g. poor resolution of images) to allow for appropriate medical decision making by the Practitioner; and/or   In rare instances, security protocols could fail, causing a breach of personal health information.  Furthermore, I acknowledge that it is my responsibility to provide information about my medical history, conditions and care that is complete and accurate to the best of my ability. I acknowledge that Practitioner's advice, recommendations, and/or decision may be based on factors not within their control, such as incomplete or inaccurate data provided by me or distortions of diagnostic images or specimens that may result from electronic transmissions. I understand that the practice of medicine is not an exact science and that Practitioner makes no warranties or guarantees regarding treatment outcomes. I acknowledge that a copy of this consent can be made available to me via my patient portal ([facility]), or I can request a printed copy by calling the office of [facility].    I understand that my insurance will be billed for this visit.   I have read or had this consent read to me. I understand the contents of this consent, which adequately explains the benefits and risks of the Services being provided via telemedicine.  I have been provided ample opportunity to ask questions regarding this consent and the Services and have had my questions answered to my satisfaction. I give my informed consent for the services to be provided through the use of telemedicine in my medical care

## 2021-12-01 ENCOUNTER — Encounter (HOSPITAL_COMMUNITY): Payer: Self-pay | Admitting: Gastroenterology

## 2021-12-11 ENCOUNTER — Ambulatory Visit (HOSPITAL_COMMUNITY)
Admission: RE | Admit: 2021-12-11 | Discharge: 2021-12-11 | Disposition: A | Discharge status: Home / Self Care | Payer: Medicare HMO | Attending: Gastroenterology | Admitting: Gastroenterology

## 2021-12-11 ENCOUNTER — Ambulatory Visit (HOSPITAL_BASED_OUTPATIENT_CLINIC_OR_DEPARTMENT_OTHER): Payer: Medicare HMO | Admitting: Anesthesiology

## 2021-12-11 ENCOUNTER — Encounter (HOSPITAL_COMMUNITY): Payer: Self-pay | Admitting: Gastroenterology

## 2021-12-11 ENCOUNTER — Other Ambulatory Visit: Payer: Self-pay

## 2021-12-11 ENCOUNTER — Ambulatory Visit (HOSPITAL_COMMUNITY): Payer: Medicare HMO | Admitting: Anesthesiology

## 2021-12-11 ENCOUNTER — Encounter (HOSPITAL_COMMUNITY)
Admission: RE | Disposition: A | Discharge status: Home / Self Care | Payer: Self-pay | Source: Home / Self Care | Attending: Gastroenterology

## 2021-12-11 DIAGNOSIS — K5730 Diverticulosis of large intestine without perforation or abscess without bleeding: Secondary | ICD-10-CM | POA: Diagnosis not present

## 2021-12-11 DIAGNOSIS — I10 Essential (primary) hypertension: Secondary | ICD-10-CM

## 2021-12-11 DIAGNOSIS — I4891 Unspecified atrial fibrillation: Secondary | ICD-10-CM | POA: Diagnosis not present

## 2021-12-11 DIAGNOSIS — Z980 Intestinal bypass and anastomosis status: Secondary | ICD-10-CM | POA: Insufficient documentation

## 2021-12-11 DIAGNOSIS — Z08 Encounter for follow-up examination after completed treatment for malignant neoplasm: Secondary | ICD-10-CM | POA: Diagnosis not present

## 2021-12-11 DIAGNOSIS — Z85038 Personal history of other malignant neoplasm of large intestine: Secondary | ICD-10-CM | POA: Diagnosis not present

## 2021-12-11 DIAGNOSIS — I2510 Atherosclerotic heart disease of native coronary artery without angina pectoris: Secondary | ICD-10-CM | POA: Diagnosis not present

## 2021-12-11 DIAGNOSIS — Z1211 Encounter for screening for malignant neoplasm of colon: Secondary | ICD-10-CM | POA: Diagnosis not present

## 2021-12-11 HISTORY — PX: COLONOSCOPY WITH PROPOFOL: SHX5780

## 2021-12-11 SURGERY — COLONOSCOPY WITH PROPOFOL
Anesthesia: Monitor Anesthesia Care

## 2021-12-11 MED ORDER — PROPOFOL 10 MG/ML IV BOLUS
INTRAVENOUS | Status: DC | PRN
  Administered 2021-12-11: 20 mg via INTRAVENOUS
  Administered 2021-12-11: 30 mg via INTRAVENOUS

## 2021-12-11 MED ORDER — PROPOFOL 500 MG/50ML IV EMUL
INTRAVENOUS | Status: AC
  Filled 2021-12-11: qty 50

## 2021-12-11 MED ORDER — PROPOFOL 500 MG/50ML IV EMUL
INTRAVENOUS | Status: DC | PRN
  Administered 2021-12-11: 125 ug/kg/min via INTRAVENOUS

## 2021-12-11 MED ORDER — SODIUM CHLORIDE 0.9 % IV SOLN: INTRAVENOUS | Status: DC

## 2021-12-11 MED ORDER — PROPOFOL 10 MG/ML IV BOLUS
INTRAVENOUS | Status: AC
  Filled 2021-12-11: qty 20

## 2021-12-11 MED ORDER — PROPOFOL 500 MG/50ML IV EMUL
INTRAVENOUS | Status: AC
  Filled 2021-12-11: qty 100

## 2021-12-11 MED ORDER — PROPOFOL 1000 MG/100ML IV EMUL
INTRAVENOUS | Status: AC
  Filled 2021-12-11: qty 100

## 2021-12-11 MED ORDER — LACTATED RINGERS IV SOLN
INTRAVENOUS | Status: DC
  Administered 2021-12-11: 1000 mL via INTRAVENOUS

## 2021-12-11 MED ORDER — PROPOFOL 1000 MG/100ML IV EMUL
INTRAVENOUS | Status: AC
  Filled 2021-12-11: qty 200

## 2021-12-11 SURGICAL SUPPLY — 22 items

## 2021-12-11 NOTE — Anesthesia Preprocedure Evaluation (Signed)
Anesthesia Evaluation  Patient identified by MRN, date of birth, ID band Patient awake    Reviewed: Allergy & Precautions, NPO status , Patient's Chart, lab work & pertinent test results  Airway Mallampati: II  TM Distance: >3 FB Neck ROM: Full    Dental  (+) Teeth Intact, Dental Advisory Given   Pulmonary sleep apnea ,    breath sounds clear to auscultation       Cardiovascular hypertension, + CAD  + dysrhythmias Atrial Fibrillation  Rhythm:Regular Rate:Normal     Neuro/Psych negative neurological ROS  negative psych ROS   GI/Hepatic negative GI ROS, Neg liver ROS,   Endo/Other  negative endocrine ROS  Renal/GU negative Renal ROS     Musculoskeletal negative musculoskeletal ROS (+)   Abdominal Normal abdominal exam  (+)   Peds  Hematology negative hematology ROS (+)   Anesthesia Other Findings   Reproductive/Obstetrics                             Anesthesia Physical Anesthesia Plan  ASA: 3  Anesthesia Plan: MAC   Post-op Pain Management:    Induction: Intravenous  PONV Risk Score and Plan: 0 and Propofol infusion  Airway Management Planned: Natural Airway and Simple Face Mask  Additional Equipment: None  Intra-op Plan:   Post-operative Plan:   Informed Consent: I have reviewed the patients History and Physical, chart, labs and discussed the procedure including the risks, benefits and alternatives for the proposed anesthesia with the patient or authorized representative who has indicated his/her understanding and acceptance.       Plan Discussed with: CRNA  Anesthesia Plan Comments:         Anesthesia Quick Evaluation

## 2021-12-11 NOTE — Transfer of Care (Signed)
Immediate Anesthesia Transfer of Care Note  Patient: Thomas Knight  Procedure(s) Performed: COLONOSCOPY WITH PROPOFOL  Patient Location: Endoscopy Unit  Anesthesia Type:MAC  Level of Consciousness: awake, alert  and oriented  Airway & Oxygen Therapy: Patient Spontanous Breathing and Patient connected to face mask oxygen  Post-op Assessment: Report given to RN and Post -op Vital signs reviewed and stable  Post vital signs: Reviewed and stable  Last Vitals:  Vitals Value Taken Time  BP 113/49 12/11/21 0803  Temp 36.6 C 12/11/21 0803  Pulse 57 12/11/21 0804  Resp 18 12/11/21 0804  SpO2 99 % 12/11/21 0804  Vitals shown include unvalidated device data.  Last Pain:  Vitals:   12/11/21 0803  TempSrc: Temporal  PainSc: Asleep         Complications: No notable events documented.

## 2021-12-11 NOTE — Op Note (Signed)
[facility] Patient Name: Thomas Knight Procedure Date: 12/11/2021 MRN: [id number] Attending MD: Carol Ada , MD Date of Birth: 1945/10/25 CSN: [id number] Age: 76 Admit Type: Outpatient Procedure:                Colonoscopy Indications:              High risk colon cancer surveillance: Personal                            history of colon cancer Providers:                Carol Ada, MD, Benay Pillow, RN, Benetta Spar, Technician Referring MD:              Medicines:                Propofol per Anesthesia Complications:            No immediate complications. Estimated Blood Loss:     Estimated blood loss: none. Procedure:                Pre-Anesthesia Assessment:                           - Prior to the procedure, a History and Physical                            was performed, and patient medications and                            allergies were reviewed. The patient's tolerance of                            previous anesthesia was also reviewed. The risks                            and benefits of the procedure and the sedation                            options and risks were discussed with the patient.                            All questions were answered, and informed consent                            was obtained. Prior Anticoagulants: The patient has                            taken Eliquis (apixaban), last dose was 4 days                            prior to procedure. ASA Grade Assessment: III - A  patient with severe systemic disease. After                            reviewing the risks and benefits, the patient was                            deemed in satisfactory condition to undergo the                            procedure.                           - Sedation was administered by an anesthesia                            professional. Deep sedation was attained.                           After  obtaining informed consent, the colonoscope                            was passed under direct vision. Throughout the                            procedure, the patient's blood pressure, pulse, and                            oxygen saturations were monitored continuously. The                            CF-HQ190L ([id number]) Olympus colonoscope was                            introduced through the anus and advanced to the the                            ileocolonic anastomosis. The colonoscopy was                            performed without difficulty. The patient tolerated                            the procedure well. The terminal ileum was                            photographed. The quality of the bowel preparation                            was good. Scope In: 7:44:22 AM Scope Out: 7:57:53 AM Scope Withdrawal Time: 0 hours 11 minutes 1 second  Total Procedure Duration: 0 hours 13 minutes 31 seconds  Findings:      There was evidence of a prior functional end-to-end ileo-colonic       anastomosis in the transverse colon. This was patent and was       characterized by healthy  appearing mucosa. The anastomosis was traversed.      Scattered small and large-mouthed diverticula were found in the sigmoid       colon. Impression:               - Patent functional end-to-end ileo-colonic                            anastomosis, characterized by healthy appearing                            mucosa.                           - Diverticulosis in the sigmoid colon.                           - No specimens collected. Moderate Sedation:      Not Applicable - Patient had care per Anesthesia. Recommendation:           - Patient has a contact number available for                            emergencies. The signs and symptoms of potential                            delayed complications were discussed with the                            patient. Return to normal activities tomorrow.                             Written discharge instructions were provided to the                            patient.                           - Resume previous diet.                           - Continue present medications.                           - Repeat colonoscopy in 3 years for surveillance.                           - Resume Eliquis. Procedure Code(s):        --- Professional ---                           [phone], Colonoscopy, flexible; diagnostic, including                            collection of specimen(s) by brushing or washing,                            when performed (separate procedure) Diagnosis Code(s):        ---  Professional ---                           P23.300, Personal history of other malignant                            neoplasm of large intestine                           Z98.0, Intestinal bypass and anastomosis status                           K57.30, Diverticulosis of large intestine without                            perforation or abscess without bleeding CPT copyright 2019 American Medical Association. All rights reserved. The codes documented in this report are preliminary and upon coder review may  be revised to meet current compliance requirements. Carol Ada, MD Carol Ada, MD 12/11/2021 8:05:27 AM This report has been signed electronically. Number of Addenda: 0

## 2021-12-11 NOTE — H&P (Signed)
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  He is well from the surgery and he did not require any chemotherapy.  The patient remains active with brisk walking for 15 minutes and then lifiting some weights.   Past Medical History:  Diagnosis Date   Bilateral leg edema 08/24/2019   Cancer ([place])    skin ca, colon   Chronic cough    Chronic pain of left knee 12/01/2015   Dysrhythmia    afib  was cardioverted   Essential hypertension 08/24/2019   Mixed hyperlipidemia 08/24/2019   Obesity (BMI 30-39.9) 08/24/2019   Persistent atrial fibrillation ([place]) 08/24/2019    Past Surgical History:  Procedure Laterality Date   ACHILLES TENDON REPAIR Left 2012   Ruptured   HERNIA REPAIR     umbilical hernia   SKIN SURGERY Left    Basal cell carcinoma on left forearm   XI ROBOTIC ASSISTED LOWER ANTERIOR RESECTION N/A 01/09/2021   Procedure: XI ROBOTIC ASSISTED RIGHT HEMI COLECTOMY, LYSIS OF ADHESIONS, SMALL BOWEL RESECTION, INTRAOPERATIVE ASSESMENT OF PERFUSION, PARTIAL OMENTECTOMY;  Surgeon: Ileana Roup, MD;  Location: [facility];  Service: General;  Laterality: N/A;    Family History  Problem Relation Age of Onset   Lung cancer Father     Social History:  reports that he has never smoked. He has never used smokeless tobacco. He reports that he does not drink alcohol and does not use drugs.  Allergies: No Known Allergies  Medications: Scheduled: Continuous:  sodium chloride     lactated ringers 10 mL/hr at 12/11/21 0656    No results found for this or any previous visit (from the past 24 hour(s)).   No results found.  ROS:  As stated above in the HPI otherwise negative.  Blood pressure (!) 187/74, pulse 65, temperature 98.5 F (36.9 C), temperature source Tympanic, resp. rate 20, height 5\' 9"  (1.753 m), weight 115 kg, SpO2 97 %.    PE: Gen: NAD, Alert and Oriented HEENT:  [place]/AT,  EOMI Neck: Supple, no LAD Lungs: CTA Bilaterally CV: RRR without M/G/R ABD: Soft, NTND, +BS Ext: No C/C/E  Assessment/Plan: 1) Personal history of colon cancer - colonoscopy.  Thomas Knight 12/11/2021, 7:14 AM

## 2021-12-11 NOTE — Discharge Instructions (Signed)

## 2021-12-11 NOTE — Anesthesia Postprocedure Evaluation (Signed)
Anesthesia Post Note  Patient: Hydrologist  Procedure(s) Performed: COLONOSCOPY WITH PROPOFOL     Patient location during evaluation: PACU Anesthesia Type: MAC Level of consciousness: awake and alert Pain management: pain level controlled Vital Signs Assessment: post-procedure vital signs reviewed and stable Respiratory status: spontaneous breathing, nonlabored ventilation, respiratory function stable and patient connected to nasal cannula oxygen Cardiovascular status: stable and blood pressure returned to baseline Postop Assessment: no apparent nausea or vomiting Anesthetic complications: no   No notable events documented.  Last Vitals:  Vitals:   12/11/21 0813 12/11/21 0821  BP: 115/63 (!) 166/78  Pulse: (!) 57 (!) 58  Resp: 15 18  Temp:    SpO2: 95% 95%    Last Pain:  Vitals:   12/11/21 0821  TempSrc:   PainSc: 0-No pain                 Effie Berkshire

## 2021-12-15 ENCOUNTER — Encounter (HOSPITAL_COMMUNITY): Payer: Self-pay | Admitting: Gastroenterology

## 2022-01-07 ENCOUNTER — Telehealth: Payer: Self-pay | Admitting: Cardiology

## 2022-01-07 DIAGNOSIS — I480 Paroxysmal atrial fibrillation: Secondary | ICD-10-CM

## 2022-01-07 MED ORDER — ELIQUIS 5 MG PO TABS: 5.0000 mg | ORAL_TABLET | Freq: Two times a day (BID) | ORAL | 0 refills | Status: DC

## 2022-01-07 NOTE — Telephone Encounter (Signed)
*  STAT* If patient is at the pharmacy, call can be transferred to refill team.   1. Which medications need to be refilled? (please list name of each medication and dose if known)  ELIQUIS 5 MG TABS tablet  2. Which pharmacy/location (including street and city if local pharmacy) is medication to be sent to? [place], [place]  3. Do they need a 30 day or 90 day supply? 90 day  Patient has 3 tablets left.

## 2022-01-07 NOTE — Telephone Encounter (Addendum)
Eliquis 5mg  refill request received. Patient is 76 years old, weight-115kg, Crea-0.89 on 01/12/2021, Diagnosis-Afib, and last seen by Christen Bame on 11/17/2021. Dose is appropriate based on dosing criteria. Will send in refill to requested pharmacy.

## 2022-01-14 ENCOUNTER — Other Ambulatory Visit: Payer: Self-pay | Admitting: Surgery

## 2022-01-14 DIAGNOSIS — Z85038 Personal history of other malignant neoplasm of large intestine: Secondary | ICD-10-CM

## 2022-01-25 ENCOUNTER — Ambulatory Visit
Admission: RE | Admit: 2022-01-25 | Discharge: 2022-01-25 | Disposition: A | Discharge status: Home / Self Care | Payer: Medicare HMO | Source: Ambulatory Visit | Attending: Surgery | Admitting: Surgery

## 2022-01-25 DIAGNOSIS — K3189 Other diseases of stomach and duodenum: Secondary | ICD-10-CM | POA: Diagnosis not present

## 2022-01-25 DIAGNOSIS — N289 Disorder of kidney and ureter, unspecified: Secondary | ICD-10-CM | POA: Diagnosis not present

## 2022-01-25 DIAGNOSIS — E785 Hyperlipidemia, unspecified: Secondary | ICD-10-CM | POA: Diagnosis not present

## 2022-01-25 DIAGNOSIS — R599 Enlarged lymph nodes, unspecified: Secondary | ICD-10-CM | POA: Diagnosis not present

## 2022-01-25 DIAGNOSIS — C189 Malignant neoplasm of colon, unspecified: Secondary | ICD-10-CM | POA: Diagnosis not present

## 2022-01-25 DIAGNOSIS — K769 Liver disease, unspecified: Secondary | ICD-10-CM | POA: Diagnosis not present

## 2022-01-25 DIAGNOSIS — Z85038 Personal history of other malignant neoplasm of large intestine: Secondary | ICD-10-CM

## 2022-01-25 DIAGNOSIS — R918 Other nonspecific abnormal finding of lung field: Secondary | ICD-10-CM | POA: Diagnosis not present

## 2022-01-25 DIAGNOSIS — I10 Essential (primary) hypertension: Secondary | ICD-10-CM | POA: Diagnosis not present

## 2022-01-25 DIAGNOSIS — I700 Atherosclerosis of aorta: Secondary | ICD-10-CM | POA: Diagnosis not present

## 2022-01-25 MED ORDER — IOPAMIDOL (ISOVUE-300) INJECTION 61%
100.0000 mL | Freq: Once | INTRAVENOUS | Status: AC | PRN
  Administered 2022-01-25: 100 mL via INTRAVENOUS

## 2022-01-26 ENCOUNTER — Telehealth: Payer: Self-pay | Admitting: Physician Assistant

## 2022-01-26 NOTE — Telephone Encounter (Signed)
Scheduled appt per 8/1 referral. Pt is aware of appt date and time. Pt is aware to arrive 15 mins prior to appt time and to bring and updated insurance card. Pt is aware of appt location.

## 2022-01-27 NOTE — Progress Notes (Signed)
[place] [phone]   [phone]  CONSULT NOTE  REFERRING PHYSICIAN: Dr. Dema Severin  REASON FOR CONSULTATION:  Hx Stage II CRC, for disease recurrence   HPI Thomas Knight is a 76 y.o. male with a past medical history significant for hypertension, basal cell carcinoma, ruptured Achilles tendon, hyperlipidemia, obesity, and A-fib (on Eliquis, Dr. Harriet Masson) is referred to the clinic for suspicious recurrent colorectal cancer.  The patient underwent colonoscopy by Dr. Benson Norway in May 2022.  He was found to have a mass in the transverse colon.   The patient subsequently had a staging CT of the abdomen and pelvis which showed an apple core mass in the transverse colon without any evidence of local regional adenopathy or metastatic disease.  He was asymptomatic.  Therefore, the patient underwent a right hemicolectomy on 01/09/2021.  The pathology showed moderately differentiated adenocarcinoma PT3N0 (0/17); MMR preserved.  Initial CEA from 06/24/2021 was 2.1.  For surveillance, he recently had a repeat colonoscopy performed on 12/11/2021 which showed healthy-appearing anastomosis as well as diverticulosis.  The patient has surveillance CT of the chest, abdomen, and pelvis on 01/25/2022 which showed new right-sided pulmonary nodules measuring up to 11 mm which are nonspecific but concerning for metastatic disease, there was also a new nodularity/lymph nodes in the central mesentery concerning for nodal disease involvement.  There is slightly increase in size of prominent retroperitoneal lymph nodes, which is nonspecific but also concerning for nodal disease involvement.  There is stable prominent mediastinal lymph nodes which are nonspecific but stable.  There is also a stable hypodense 8 mm hepatic lesion.   The patient was referred to the clinic for further evaluation regarding these findings.  Overall, the patient is feeling well today without any concerning  complaints.  The patient has never had any symptoms related to his initial diagnosis or symptoms related to his possible recurrence.  The patient is active walking 30 minutes daily.  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  The patient denies any recent fatigue, sleep disturbances, appetite change, fevers, chills, night sweats, lymphadenopathy, or unexplained weight loss. Denies any nausea, vomiting, abdominal pain, constipation, melena, or hematochezia. Denies any cough, shortness of breath, chest pain, or hemoptysis.  Patient is a never smoker.    The patient's father had a history of tobacco use and was diagnosed with tongue and lung cancer.  The patient's mother passed away at the age of 31 secondary to sepsis.  The patient has a sister who has some unknown medical problems but no malignancy.  As previously mentioned, the patient is a retired Careers adviser.  He is married and accompanied by his wife, Thomas Knight, and his daughter, Thomas Knight, today.  His family and him live in the [place] region.  As previously mentioned, the patient is a never smoker.  He seldomly drinks alcoholic beverages and estimates he drinks approximately 1 beer per month.  Denies any history of drug use.  HPI  Past Medical History:  Diagnosis Date   Bilateral leg edema 08/24/2019   Cancer ([place])    skin ca, colon   Chronic cough    Chronic pain of left knee 12/01/2015   Dysrhythmia    afib  was cardioverted   Essential hypertension 08/24/2019   Mixed hyperlipidemia 08/24/2019   Obesity (BMI 30-39.9) 08/24/2019   Persistent atrial fibrillation ([place]) 08/24/2019    Past Surgical History:  Procedure Laterality Date   ACHILLES TENDON REPAIR Left 2012   Ruptured   COLONOSCOPY WITH PROPOFOL N/A 12/11/2021   Procedure: COLONOSCOPY WITH PROPOFOL;  Surgeon: Carol Ada, MD;  Location: [facility];  Service:  Gastroenterology;  Laterality: N/A;   HERNIA REPAIR     umbilical hernia   SKIN SURGERY Left    Basal cell carcinoma on left forearm   XI ROBOTIC ASSISTED LOWER ANTERIOR RESECTION N/A 01/09/2021   Procedure: XI ROBOTIC ASSISTED RIGHT HEMI COLECTOMY, LYSIS OF ADHESIONS, SMALL BOWEL RESECTION, INTRAOPERATIVE ASSESMENT OF PERFUSION, PARTIAL OMENTECTOMY;  Surgeon: Ileana Roup, MD;  Location: [facility];  Service: General;  Laterality: N/A;    Family History  Problem Relation Age of Onset   Lung cancer Father     Social History Social History   Tobacco Use   Smoking status: Never   Smokeless tobacco: Never  Vaping Use   Vaping Use: Never used  Substance Use Topics   Alcohol use: Never   Drug use: Never    No Known Allergies  Current Outpatient Medications  Medication Sig Dispense Refill   Ascorbic Acid (VITAMIN C WITH ROSE HIPS) 500 MG tablet Take 500 mg by mouth daily.     Cholecalciferol (D3-1000) 25 MCG (1000 UT) capsule Take 1,000 Units by mouth daily.     ELIQUIS 5 MG TABS tablet Take 1 tablet (5 mg total) by mouth 2 (two) times daily. 180 tablet 0   metoprolol tartrate (LOPRESSOR) 25 MG tablet Take 1 tablet (25 mg total) by mouth 2 (two) times daily. 180 tablet 3   nitroGLYCERIN (NITROSTAT) 0.4 MG SL tablet Place 1 tablet (0.4 mg total) under the tongue every 5 (five) minutes as needed. (Patient taking differently: Place 0.4 mg under the tongue every 5 (five) minutes as needed for chest pain.) 25 tablet 7   OVER THE COUNTER MEDICATION Take 1 tablet by mouth daily. Instaflex featuring UC-II Collagen/ Relieves discomfort, improves flexibility     tadalafil (CIALIS) 5 MG tablet Take 5 mg by mouth daily as needed for erectile dysfunction.     vitamin B-12 (CYANOCOBALAMIN) 1000 MCG tablet Take 1,000 mcg by mouth daily.     Zinc 50 MG CAPS Take 50 mg by mouth daily.     No current facility-administered medications for this visit.    REVIEW OF SYSTEMS:   Review of Systems   Constitutional: Negative for appetite change, chills, fatigue, fever and unexpected weight change.  HENT: Negative for mouth sores, nosebleeds, sore throat and trouble swallowing.   Eyes: Negative for eye problems and icterus.  Respiratory: Negative for cough, hemoptysis, shortness of breath and wheezing.   Cardiovascular: Negative for chest pain and leg swelling.  Gastrointestinal: Negative for abdominal pain, constipation, diarrhea, nausea and vomiting.  Genitourinary: Negative for bladder incontinence, difficulty urinating, dysuria, frequency and hematuria.   Musculoskeletal: Negative for back pain, gait problem, neck pain and neck stiffness.  Skin: Negative for itching and rash.  Neurological: Negative for dizziness, extremity weakness, gait problem, headaches, light-headedness and seizures.  Hematological: Negative for adenopathy. Does not bruise/bleed easily.  Psychiatric/Behavioral: Negative for confusion, depression and sleep disturbance. The patient is not nervous/anxious.     PHYSICAL EXAMINATION:  There were no vitals taken for this visit.  ECOG PERFORMANCE STATUS: 0  Physical Exam  Constitutional: Oriented to person, place, and time and well-developed, well-nourished, and in no distress.  HENT:  Head: Normocephalic and atraumatic.  Mouth/Throat: Oropharynx is clear and moist. No oropharyngeal exudate.  Eyes:  Conjunctivae are normal. Right eye exhibits no discharge. Left eye exhibits no discharge. No scleral icterus.  Neck: Normal range of motion. Neck supple.  Cardiovascular: Normal rate, regular rhythm, normal heart sounds and intact distal pulses.   Pulmonary/Chest: Effort normal and breath sounds normal. No respiratory distress. No wheezes. No rales.  Abdominal: Soft. Bowel sounds are normal. Exhibits no distension and no mass. There is no tenderness.  Musculoskeletal: Normal range of motion. Exhibits no edema.  Lymphadenopathy:    No cervical adenopathy.   Neurological: Alert and oriented to person, place, and time. Exhibits normal muscle tone. Gait normal. Coordination normal.  Skin: Skin is warm and dry. No rash noted. Not diaphoretic. No erythema. No pallor.  Psychiatric: Mood, memory and judgment normal.  Vitals reviewed.  LABORATORY DATA: Lab Results  Component Value Date   WBC 11.0 (H) 01/12/2021   HGB 11.5 (L) 01/12/2021   HCT 36.3 (L) 01/12/2021   MCV 88.1 01/12/2021   PLT 242 01/12/2021      Chemistry      Component Value Date/Time   NA 138 01/12/2021 0408   NA 142 02/20/2020 0912   K 4.1 01/12/2021 0408   CL 102 01/12/2021 0408   CO2 28 01/12/2021 0408   BUN 13 01/12/2021 0408   BUN 9 02/20/2020 0912   CREATININE 0.89 01/12/2021 0408      Component Value Date/Time   CALCIUM 8.7 (L) 01/12/2021 0408   ALKPHOS 60 12/23/2020 1103   AST 23 12/23/2020 1103   ALT 23 12/23/2020 1103   BILITOT 0.6 12/23/2020 1103       RADIOGRAPHIC STUDIES: CT CHEST ABDOMEN PELVIS W CONTRAST  Result Date: 01/25/2022 CLINICAL DATA:  Colon cancer staging.  * Tracking Code: BO * EXAM: CT CHEST, ABDOMEN, AND PELVIS WITH CONTRAST TECHNIQUE: Multidetector CT imaging of the chest, abdomen and pelvis was performed following the standard protocol during bolus administration of intravenous contrast. RADIATION DOSE REDUCTION: This exam was performed according to the departmental dose-optimization program which includes automated exposure control, adjustment of the mA and/or kV according to patient size and/or use of iterative reconstruction technique. CONTRAST:  170m ISOVUE-300 IOPAMIDOL (ISOVUE-300) INJECTION 61% COMPARISON:  CT Nov 06, 2020 FINDINGS: CT CHEST FINDINGS Cardiovascular: Aortic atherosclerosis. No central pulmonary embolus on this nondedicated study. Normal size heart. No significant pericardial effusion/thickening. Mediastinum/Nodes: No supraclavicular adenopathy. Hypodense 19 mm nodule in the left lobe of the thyroid. Prominent  mediastinal lymph nodes not pathologically enlarged by size criteria are similar prior. No pathologically enlarged mediastinal, hilar or axillary lymph nodes. The esophagus is grossly unremarkable. Lungs/Pleura: New pulmonary nodule in the peripheral right upper lobe measuring 11 mm on image 59/3. New posterior peripheral 10 mm right upper lobe pulmonary nodule on image 49/3. New 3 mm central right upper lobe pulmonary nodule on image 55/3. Musculoskeletal: No aggressive lytic or blastic lesion of bone. No suspicious chest wall mass. Multilevel degenerative changes spine with bridging anterior vertebral osteophytes. CT ABDOMEN PELVIS FINDINGS Hepatobiliary: Hypodense 8 mm lesion in the right lobe of the liver is unchanged from prior. No solid enhancing hepatic lesion. Gallbladder is unremarkable. No biliary ductal dilation. Pancreas: No pancreatic ductal dilation or evidence of acute inflammation. Spleen: No splenomegaly or focal splenic lesion. Adrenals/Urinary Tract: Bilateral adrenal glands appear normal. No hydronephrosis. Exophytic 2.2 cm left renal lesion measures fluid density consistent with a cyst and considered benign requiring no imaging follow-up. Urinary bladder is unremarkable for degree of distension. Stomach/Bowel: Radiopaque enteric contrast material traverses the  rectum. Stomach is minimally distended limiting evaluation. No pathologic dilation of small or large bowel. No evidence of acute bowel inflammation. Surgical change of partial right hemicolectomy without suspicious enhancing soft tissue nodularity along the suture line. Vascular/Lymphatic: Aortic atherosclerosis without abdominal aortic aneurysm. Prominent retroperitoneal lymph nodes are slightly increased from prior for instance a precaval lymph node on image 80/2 measuring 7 mm previously measured 4 mm. New nodularity/lymph nodes in the central mesentery for instance on images 65-60 8/2 measuring up to 10 mm on image 65/2. Stable  prominent right external iliac lymph node measuring 9 mm on image 99/2. Reproductive: Prostatomegaly. Other: No significant abdominopelvic free fluid.  No omental caking. Musculoskeletal: No aggressive lytic or blastic lesion of bone. Multilevel degenerative changes spine. Degenerative changes bilateral hips. IMPRESSION: 1. New right-sided pulmonary nodules measure up to 11 mm, nonspecific but concerning for metastatic disease. Consider further evaluation with nuclear medicine PET/CT. 2. New nodularity/lymph nodes in the central mesentery, concerning for nodal disease involvement. 3. Slightly increased size of prominent retroperitoneal lymph nodes, nonspecific but also somewhat concerning for nodal disease involvement. 4. Surgical change of partial colectomy without evidence of local recurrence. 5. Stable prominent mediastinal lymph nodes, nonspecific but stability is reassuring. Attention on follow-up imaging suggested. 6. Stable prominent right external iliac lymph node, nonspecific but stability is reassuring. Attention on follow-up imaging suggested. 7. Stable hypodense 8 mm hepatic lesion technically too small to accurately characterize but stability is reassuring. Attention on follow-up imaging suggested. 8. 1.9 cm incidental left thyroid nodule. Recommend thyroid US. Reference: J Am Coll Radiol. 2015 Feb;12(2): 143-50 9.  Aortic Atherosclerosis (ICD10-I70.0). These results will be called to the ordering clinician or representative by the Radiologist Assistant, and communication documented in the PACS or [employer]. Electronically Signed   By: Dahlia Bailiff M.D.   On: 01/25/2022 16:15    ASSESSMENT: This is a very pleasant 76 year old male with:  Colorectal cancer, initially diagnosed in July 2022 as stage II (pT3, N0), suspicious disease recurrence in July 2023. -Moderately differentiated adenocarcinoma of the transverse colon was found on screening colonoscopy May 2022. -Underwent right  hemicolectomy on 01/09/2021 which showed 3 cm moderately differentiated adenocarcinoma of the transverse colon.  Stage is a pT3, PN0 (0/17).  MMR preserved.  -More recently, on 01/25/2022 the patient had a surveillance CT of the chest, abdomen, and pelvis which showed new right-sided pulmonary nodules measuring up to 11 mm, new nodularity/lymph nodes in the central mesentery, slightly increased nonspecific prominent retroperitoneal lymph nodes, and stable prominent mediastinal lymph nodes which are also noted to be nonspecific as well as a stable prominent nonspecific right external iliac lymph node and a stable hypodense 8 mm hepatic lesion which is too small to accurately characterize. -The patient was seen with Dr. Burr Medico today.  Dr. Burr Medico had a lengthy discussion with the patient today about his current condition and recommended work-up. -Dr. Burr Medico recommends a PET scan for staging and to assess for sites amenable to biopsy -Dr. Burr Medico discussed the definitive way to diagnose recurrent colorectal cancer is for tissue diagnosis.  That if the lung nodules are found to be consistent with CRC, Dr. Burr Medico discussed this would make the patient stage IV disease, which is generally treatable but not curable in majority of cases.  -We will discuss the patient's case at the multidisciplinary thoracic conference to see the preferred method for biopsy (CT biopsy versus bronchoscopy) of the lung nodules if they are found to be suspicious on PET scan.  -  We will obtain guardian reveal testing and CEA testing today.  Discussed with the patient and his family that if this is elevated, that is more suspicious for metastatic disease -We we will tentatively place an order for CT-guided biopsy by interventional radiology for the peripheral lung nodule.  However, if after the discussion at the multidisciplinary thoracic conference determines that bronchoscopy is the best option for biopsy, then we will cancel the CT-guided biopsy. -If  repeat biopsy that confirms metastatic CRC, Dr. Burr Medico would likely request that this be sent for foundation 1 testing to see if the patient would be a candidate for any targeted treatment or immunotherapy. -Dr. Burr Medico briefly mentioned if this is found to be metastatic colorectal cancer, she generally would recommend chemotherapy which is given every 2 to 3 weeks depending on the regimen.  If chemotherapy is recommended, Dr. Burr Medico discussed that Dr. Dema Severin may consider putting in a Port-A-Cath.  The patient is scheduled to see Dr. Dema Severin on 02/10/2022.  If there are only a few sites of disease and if the patient has a positive response to treatment, Dr. Burr Medico discussed some patients may go on to get radiation or surgery after chemotherapy.  Once again this all depends on the PET scan results, biopsy, and response to treatment. -We estimated this all will take approximately 2 weeks or so to have all the studies performed.  We will see the patient back after to review the results and for more detailed discussion about his current condition and recommended treatment based on these findings.   2. Hypertension -The patient's blood pressure is elevated today.  The patient reports he is nervous and his blood pressure is typically high at his clinic appointments.  He is asymptomatic -The patient has a blood pressure cuff at home and checks his blood pressure every night.  He states that his blood pressure is normal at home using 2 different blood pressure cuffs.  3.  Social -The patient lives in a house with his wife Jocelyn Lamer.  The patient's daughter and son live nearby. 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 -The patient is active and walks 30 minutes every day.  He is also on the football field approximately 2-1/2 hours on days of practice   PLAN: -Guardian revealed testing and CEA testing performed today -Order PET scan -Discussed case at multidisciplinary GI and  thoracic conference next week -Tentative CT biopsy of the lung nodule ordered -Follow-up in approximately 2 to 3 weeks once all the studies have been performed for more detailed discussion about his current condition and treatment options.  The patient voices understanding of current disease status and treatment options and is in agreement with the current care plan.  All questions were answered. The patient knows to call the clinic with any problems, questions or concerns. We can certainly see the patient much sooner if necessary.  Thank you so much for allowing me to participate in the care of [facility]. I will continue to follow up the patient with you and assist in his care.  Disclaimer: This note was dictated with voice recognition software. Similar sounding words can inadvertently be transcribed and may not be corrected upon review.   Abram Sax L Rumeal Cullipher January 27, 2022, 8:00 AM  Addendum I have seen the patient, examined him. I agree with the assessment and and plan and have edited the notes.   76 year old gentleman, with past medical history of stage II transverse  colon cancer, status post hemicolectomy on January 09, 2021, now presented with screening discovered multiple lung nodules, with the largest 1.1 cm in right lower, and adenopathy in the central mesentery and retroperitoneum, measuring up to 9 mm.  This is concerning for metastatic colon cancer.  He is asymptomatic, clinically doing very well.  We will repeat lab, including tumor marker CEA and GardianReveal (circulating colon cancer tumor DNA), and obtain PET scan for further evaluation.  We will review his case in thoracic tumor board next week, to see if we can biopsy the lung nodule and how to biopsy.  We discussed that tissue diagnosis is needed before we can offer treatment..  If the lung biopsy confirmed metastatic disease, this is stage IV, and likely incurable disease due to multiple lung nodules and adenopathy.   Patient and his daughter had multiple questions and I answered to the best of my ability.  I plan to see them back after the above work-up, to finalize a treatment plan if this is metastatic cancer.  We briefly discussed that chemotherapy would likely the first-line treatment, and he may need a port for that. He voiced good understanding, and agrees with the plan.  He is also scheduled to see his surgeon Dr. Dema Severin next week.  Truitt Merle  01/29/2022

## 2022-01-29 ENCOUNTER — Encounter: Payer: Self-pay | Admitting: Physician Assistant

## 2022-01-29 ENCOUNTER — Inpatient Hospital Stay: Payer: Medicare HMO | Attending: Physician Assistant | Admitting: Physician Assistant

## 2022-01-29 ENCOUNTER — Inpatient Hospital Stay: Payer: Medicare HMO

## 2022-01-29 ENCOUNTER — Other Ambulatory Visit: Payer: Medicare HMO

## 2022-01-29 ENCOUNTER — Other Ambulatory Visit: Payer: Self-pay

## 2022-01-29 ENCOUNTER — Other Ambulatory Visit: Payer: Self-pay | Admitting: Physician Assistant

## 2022-01-29 VITALS — BP 184/69 | HR 58 | Temp 97.9°F | Resp 20 | Wt 253.3 lb

## 2022-01-29 DIAGNOSIS — C184 Malignant neoplasm of transverse colon: Secondary | ICD-10-CM

## 2022-01-29 DIAGNOSIS — R918 Other nonspecific abnormal finding of lung field: Secondary | ICD-10-CM

## 2022-01-29 DIAGNOSIS — K769 Liver disease, unspecified: Secondary | ICD-10-CM | POA: Insufficient documentation

## 2022-01-29 DIAGNOSIS — C189 Malignant neoplasm of colon, unspecified: Secondary | ICD-10-CM

## 2022-01-29 DIAGNOSIS — I10 Essential (primary) hypertension: Secondary | ICD-10-CM | POA: Insufficient documentation

## 2022-01-29 DIAGNOSIS — C7801 Secondary malignant neoplasm of right lung: Secondary | ICD-10-CM | POA: Diagnosis not present

## 2022-01-29 LAB — CEA (IN HOUSE-CHCC): CEA (CHCC-In House): 1.97 ng/mL (ref 0.00–5.00)

## 2022-01-29 NOTE — Patient Instructions (Addendum)
Summary: -It was nice meeting you all today.  I know we discussed a lot of very important information at your appointment today.  Here is a summary of the most important take away points. -Last year, Dr. Dema Severin performed surgery on your colon which showed moderately differentiated colon cancer.  At that point in time, all 17 lymph nodes that were removed were negative.  You had a tumor marker called CEA drawn in December 2022 which was within normal limits at 2.1. -On your recent CT scan that Dr. Dema Severin ordered for surveillance of the chest abdomen and pelvis that was performed on Monday 01/25/2022, you have some suspicious findings that the cancer from the colon may have spread to the upper part of the right lung.  There are also some abnormal appearing lymph nodes and a small spot in the liver that we will need to follow-up on.  You have 3 nodules in the upper right lobe of the lung measuring 3, 10, 11 mm.  I know you are curious about a percentage of these being cancer.  From Dr. Ernestina Penna experience, she feels this is 80% suspicious that this could be cancer.  But there is a percentage that this could be something benign or a different process altogether.  If this is found to be spread from the colon, this does NOT make this lung cancer.  It still would be colon cancer that spread to the lung.  If cancer cells moved to different organs of the body, that makes this "metastatic" disease which is also equivalent to being stage IV.  -If this is found to be stage IV/metastatic disease, majority of the time, this is treatable but not curable.  Although Dr. Burr Medico mention there is a small percentage, perhaps up to 20%, of her patients that have been cured from stage IV colon cancer from her experience. -If this is found to be cancer, Dr. Burr Medico would likely recommend chemotherapy for  at least 6 months.  More often than not based on the most common treatments, this is given once every 2 to 3 weeks depending on which regimen  she offers.  We are also doing test to see what markers you may have for other treatment options in the future such as targeted treatment or immunotherapy.  If you have a good response to treatment and there are not that many sites of disease, Dr. Burr Medico at that point in time may consider radiation and/or surgery.  All of this is going to depend on your PET scan and biopsy results. -Today, we are going to perform a blood test called guardian reveal and tumor marker called CEA.  If these are elevated, that is more suspicious that we are dealing with metastatic colorectal cancer.  However, the only definitive way to diagnose if the suspicious sites are cancerous is to perform a biopsy -The PET scan is going to scan you from head to mid thigh. This will better help [place] assess possible sites of disease and lymph node involvement.  This will also help [place] identify the easiest and most accessible sites for a possible biopsy.  With what we know at this point in time, if the spots in the lung look suspicious on your PET scan, those may be sites to target for biopsy.  There are 2 ways to biopsy, both of which may be challenging based on the size and location of the lung nodules.  1 way is to have interventional radiology go in with a needle from  the outside of the lung and take a piece from the lung nodule.  A radiologist would have to review your images to see if that is possible and to make sure there is nothing in the way (such as a bone).  The second way is to go down your airway into your lung.  They can usually assess lymph nodes in the center of your chest and lung this way as well.  However, an airway would have to go near the lung nodule in order to get a good sample.  A pulmonologist is someone who performs this procedure which is called a bronchoscopy. -I am going to discuss your case at the thoracic conference which will take place next Thursday on 02/04/2022.  Interventional radiologist, radiologist, and  pulmonologist are at this meeting and we will get their opinion on how accessible the sites are for biopsy.  I did go ahead and place the order for the interventional radiologist to review your images to see if they can perform the CT-guided biopsy (the one where they going from the outside into the lung).  I do not imagine that you will hear from them this week until your PET scan is performed because they usually want to review the Pet scan before doing an invasive procedure to make sure this is the best way. -The most important next step is to get the PET scan.  Be on the look out for phone call from the scanning department.  If you need to get in touch with them, their number is [phone].  If you do call them, you just need to tell them your name and date of birth and that you are trying to schedule your PET scan.  They are going to be able to open up your chart with the information they should see the order.  Our authorization team should be in the process of trying to get this approved by your insurance. -I would imagine it can take approximately 2 weeks or so to have all the following listed above performed.  We will see you back once we have all of the data so we will have a better idea about your official treatment recommendations at your next appointment which we will discuss in more depth at that time. -If you have any questions, please feel free to call the clinic or send a MyChart message for nonurgent questions.  The main office number is [phone].  Let them know that you would like to speak to Dr. Ernestina Penna or Cassie's support nurse for the day to ensure they get your call routed to the correct phone.

## 2022-02-04 ENCOUNTER — Other Ambulatory Visit: Payer: Self-pay | Admitting: Physician Assistant

## 2022-02-04 ENCOUNTER — Telehealth: Payer: Self-pay | Admitting: Physician Assistant

## 2022-02-04 ENCOUNTER — Ambulatory Visit (INDEPENDENT_AMBULATORY_CARE_PROVIDER_SITE_OTHER): Payer: Medicare HMO | Admitting: Pulmonary Disease

## 2022-02-04 ENCOUNTER — Encounter: Payer: Self-pay | Admitting: Pulmonary Disease

## 2022-02-04 ENCOUNTER — Encounter (HOSPITAL_COMMUNITY)
Admission: RE | Admit: 2022-02-04 | Discharge: 2022-02-04 | Disposition: A | Discharge status: Home / Self Care | Payer: Medicare HMO | Source: Ambulatory Visit | Attending: Physician Assistant | Admitting: Physician Assistant

## 2022-02-04 VITALS — BP 126/80 | HR 64 | Temp 97.6°F | Ht 71.5 in | Wt 249.6 lb

## 2022-02-04 DIAGNOSIS — C189 Malignant neoplasm of colon, unspecified: Secondary | ICD-10-CM

## 2022-02-04 DIAGNOSIS — Z7901 Long term (current) use of anticoagulants: Secondary | ICD-10-CM

## 2022-02-04 DIAGNOSIS — R911 Solitary pulmonary nodule: Secondary | ICD-10-CM

## 2022-02-04 DIAGNOSIS — C799 Secondary malignant neoplasm of unspecified site: Secondary | ICD-10-CM

## 2022-02-04 DIAGNOSIS — R918 Other nonspecific abnormal finding of lung field: Secondary | ICD-10-CM | POA: Insufficient documentation

## 2022-02-04 DIAGNOSIS — I480 Paroxysmal atrial fibrillation: Secondary | ICD-10-CM | POA: Diagnosis not present

## 2022-02-04 LAB — GLUCOSE, CAPILLARY: Glucose-Capillary: 81 mg/dL (ref 70–99)

## 2022-02-04 MED ORDER — FLUDEOXYGLUCOSE F - 18 (FDG) INJECTION
12.5000 | Freq: Once | INTRAVENOUS | Status: AC | PRN
  Administered 2022-02-04: 12.5 via INTRAVENOUS

## 2022-02-04 NOTE — Progress Notes (Signed)
Synopsis: Referred in August 2023 for multiple pulmonary nodules by Angelina Sheriff, MD  Subjective:   PATIENT ID: Thomas Knight GENDER: male DOB: 1946/02/06, MRN: [id number]  Chief Complaint  Patient presents with   Consult    Referred due to recent [place] conference and is here to review results.  Pt states that he has an occasional cough that will usually occur if he has a cold. Denies any complaints of SOB.    This is a 76 year old gentleman, past medical history of hypertension, hyperlipidemia, BMI 30, persistent atrial fibrillation currently on Eliquis.  Patient had a recent diagnosis of colon cancer and was taken for a right hemicolectomy.Surgical pathology on 01/09/2022 revealed a 3 cm adenocarcinoma moderately differentiated margins were uninvolved.  There were 17 lymph nodes.  These were negative.  Patient was pathologic stage classification of a pT3 pN0 M0.  Patient had a staging CT chest abdomen and pelvis complete.  This was done with contrast.  He was found to have a new right sided 11 mm pulmonary nodule.  Additionally there was some new nodular lymph nodes in the central mesentery.  There was slightly increased size of the retroperitoneal lymph nodes.  Patient's case was reviewed this morning in medical thoracic oncology multidisciplinary tumor conference.  Lesions within the chest were deemed necessary for consideration of tissue biopsy due to the patient's history of colorectal cancer.  Patient was brought urgently to clinic to discuss next steps regarding tissue biopsy.    Past Medical History:  Diagnosis Date   Bilateral leg edema 08/24/2019   Cancer ([place])    skin ca, colon   Chronic cough    Chronic pain of left knee 12/01/2015   Dysrhythmia    afib  was cardioverted   Essential hypertension 08/24/2019   Mixed hyperlipidemia 08/24/2019   Obesity (BMI 30-39.9) 08/24/2019   Persistent atrial fibrillation ([place]) 08/24/2019     Family History  Problem Relation Age  of Onset   Lung cancer Father      Past Surgical History:  Procedure Laterality Date   ACHILLES TENDON REPAIR Left 2012   Ruptured   COLONOSCOPY WITH PROPOFOL N/A 12/11/2021   Procedure: COLONOSCOPY WITH PROPOFOL;  Surgeon: Carol Ada, MD;  Location: [facility];  Service: Gastroenterology;  Laterality: N/A;   HERNIA REPAIR     umbilical hernia   SKIN SURGERY Left    Basal cell carcinoma on left forearm   XI ROBOTIC ASSISTED LOWER ANTERIOR RESECTION N/A 01/09/2021   Procedure: XI ROBOTIC ASSISTED RIGHT HEMI COLECTOMY, LYSIS OF ADHESIONS, SMALL BOWEL RESECTION, INTRAOPERATIVE ASSESMENT OF PERFUSION, PARTIAL OMENTECTOMY;  Surgeon: Ileana Roup, MD;  Location: [facility];  Service: General;  Laterality: N/A;    Social History   Socioeconomic History   Marital status: Married    Spouse name: Not on file   Number of children: Not on file   Years of education: Not on file   Highest education level: Not on file  Occupational History   Not on file  Tobacco Use   Smoking status: Never   Smokeless tobacco: Never  Vaping Use   Vaping Use: Never used  Substance and Sexual Activity   Alcohol use: Never   Drug use: Never   Sexual activity: Not Currently  Other Topics Concern   Not on file  Social History Narrative   Not on file   Social Determinants of Health   Financial Resource Strain: Not on file  Food Insecurity: Not on file  Transportation Needs: Not on file  Physical Activity: Not on file  Stress: Not on file  Social Connections: Not on file  Intimate Partner Violence: Not on file     No Known Allergies   Outpatient Medications Prior to Visit  Medication Sig Dispense Refill   Ascorbic Acid (VITAMIN C WITH ROSE HIPS) 500 MG tablet Take 500 mg by mouth daily.     Cholecalciferol (D3-1000) 25 MCG (1000 UT) capsule Take 1,000 Units by mouth daily.     ELIQUIS 5 MG TABS tablet Take 1 tablet (5 mg total) by mouth 2 (two) times daily. 180 tablet 0   metoprolol  tartrate (LOPRESSOR) 25 MG tablet Take 1 tablet (25 mg total) by mouth 2 (two) times daily. 180 tablet 3   OVER THE COUNTER MEDICATION Take 1 tablet by mouth daily. Instaflex featuring UC-II Collagen/ Relieves discomfort, improves flexibility     tadalafil (CIALIS) 5 MG tablet Take 5 mg by mouth daily as needed for erectile dysfunction.     vitamin B-12 (CYANOCOBALAMIN) 1000 MCG tablet Take 1,000 mcg by mouth daily.     Zinc 50 MG CAPS Take 50 mg by mouth daily.     nitroGLYCERIN (NITROSTAT) 0.4 MG SL tablet Place 1 tablet (0.4 mg total) under the tongue every 5 (five) minutes as needed. (Patient taking differently: Place 0.4 mg under the tongue every 5 (five) minutes as needed for chest pain.) 25 tablet 7   No facility-administered medications prior to visit.    Review of Systems  Constitutional:  Negative for chills, fever, malaise/fatigue and weight loss.  HENT:  Negative for hearing loss, sore throat and tinnitus.   Eyes:  Negative for blurred vision and double vision.  Respiratory:  Negative for cough, hemoptysis, sputum production, shortness of breath, wheezing and stridor.   Cardiovascular:  Negative for chest pain, palpitations, orthopnea, leg swelling and PND.  Gastrointestinal:  Negative for abdominal pain, constipation, diarrhea, heartburn, nausea and vomiting.  Genitourinary:  Negative for dysuria, hematuria and urgency.  Musculoskeletal:  Negative for joint pain and myalgias.  Skin:  Negative for itching and rash.  Neurological:  Negative for dizziness, tingling, weakness and headaches.  Endo/Heme/Allergies:  Negative for environmental allergies. Does not bruise/bleed easily.  Psychiatric/Behavioral:  Negative for depression. The patient is not nervous/anxious and does not have insomnia.   All other systems reviewed and are negative.    Objective:  Physical Exam Vitals reviewed.  Constitutional:      General: He is not in acute distress.    Appearance: He is  well-developed. He is obese.  HENT:     Head: Normocephalic and atraumatic.  Eyes:     General: No scleral icterus.    Conjunctiva/sclera: Conjunctivae normal.     Pupils: Pupils are equal, round, and reactive to light.  Neck:     Vascular: No JVD.     Trachea: No tracheal deviation.  Cardiovascular:     Rate and Rhythm: Normal rate and regular rhythm.     Heart sounds: Normal heart sounds. No murmur heard. Pulmonary:     Effort: Pulmonary effort is normal. No tachypnea, accessory muscle usage Knight respiratory distress.     Breath sounds: No stridor. No wheezing, rhonchi Knight rales.  Abdominal:     General: There is no distension.     Palpations: Abdomen is soft.     Tenderness: There is no abdominal tenderness.  Musculoskeletal:        General: No tenderness.  Cervical back: Neck supple.  Lymphadenopathy:     Cervical: No cervical adenopathy.  Skin:    General: Skin is warm and dry.     Capillary Refill: Capillary refill takes less than 2 seconds.     Findings: No rash.  Neurological:     Mental Status: He is alert and oriented to person, place, and time.  Psychiatric:        Behavior: Behavior normal.      Vitals:   02/04/22 1036  BP: 126/80  Pulse: 64  Temp: 97.6 F (36.4 C)  TempSrc: Oral  SpO2: 98%  Weight: 249 lb 9.6 oz (113.2 kg)  Height: 5' 11.5" (1.816 m)   98% on RA BMI Readings from Last 3 Encounters:  02/04/22 34.33 kg/m  01/29/22 37.41 kg/m  12/11/21 37.44 kg/m   Wt Readings from Last 3 Encounters:  02/04/22 249 lb 9.6 oz (113.2 kg)  01/29/22 253 lb 4.8 oz (114.9 kg)  12/11/21 253 lb 8.5 oz (115 kg)     CBC    Component Value Date/Time   WBC 11.0 (H) 01/12/2021 0408   RBC 4.12 (L) 01/12/2021 0408   HGB 11.5 (L) 01/12/2021 0408   HGB 16.5 08/01/2019 0959   HCT 36.3 (L) 01/12/2021 0408   HCT 47.4 08/01/2019 0959   PLT 242 01/12/2021 0408   PLT 198 08/01/2019 0959   MCV 88.1 01/12/2021 0408   MCV 90 08/01/2019 0959   MCH 27.9  01/12/2021 0408   MCHC 31.7 01/12/2021 0408   RDW 15.9 (H) 01/12/2021 0408   RDW 13.0 08/01/2019 0959   LYMPHSABS 1.4 12/23/2020 1103   MONOABS 0.8 12/23/2020 1103   EOSABS 0.1 12/23/2020 1103   BASOSABS 0.1 12/23/2020 1103      Chest Imaging: July CT chest 2023: Right upper lobe pulmonary nodules. The patient's images have been independently reviewed by me.    Pulmonary Functions Testing Results:     No data to display          FeNO:   Pathology:   Echocardiogram:   Heart Catheterization:     Assessment & Plan:     ICD-10-CM   1. Lung nodule  R91.1 Procedural/ Surgical Case Request: ROBOTIC ASSISTED NAVIGATIONAL BRONCHOSCOPY, VIDEO BRONCHOSCOPY WITH ENDOBRONCHIAL ULTRASOUND    Ambulatory referral to Pulmonology    CT Super D Chest Wo Contrast    CANCELED: Ambulatory referral to Pulmonology    2. Malignant neoplasm of colon, unspecified part of colon ([place])  C18.9     3. PAF (paroxysmal atrial fibrillation) ([facility])  I48.0     4. On continuous oral anticoagulation  Z79.01       Discussion:  This is a 76 year old gentleman, past medical history of colon cancer status post hemicolectomy, 17 nodes negative however was a large lesion at the time of resection.  Found to have pulmonary nodules new in existence after follow-up imaging by medical oncology.  These were concerning for metastatic disease patient was referred for consideration of robotic assisted navigation bronchoscopy tissue sampling.  Plan: Patient has nuclear medicine pet imaging scheduled for today. We will tentatively schedule bronchoscopy for next week on Tuesday. Today in the office we talked about the risk benefits alternatives of proceeding with bronchoscopy versus percutaneous needle biopsy with interventional radiology. Patient's bronchoscopy will be on 02/09/2022. Follow-up with [place] in approximately 1 week after bronchoscopy with tissue sampling. To go over path results.  Additional time  spent this morning discussing case and medical thoracic oncology conference.  As well as discussing appropriate diagnostic algorithm with patient's family and reviewing CT imaging.  Additional time also spent coordinating and planning for bronchoscopy next week.  We appreciate the PCC's help with scheduling.   Current Outpatient Medications:    Ascorbic Acid (VITAMIN C WITH ROSE HIPS) 500 MG tablet, Take 500 mg by mouth daily., Disp: , Rfl:    Cholecalciferol (D3-1000) 25 MCG (1000 UT) capsule, Take 1,000 Units by mouth daily., Disp: , Rfl:    ELIQUIS 5 MG TABS tablet, Take 1 tablet (5 mg total) by mouth 2 (two) times daily., Disp: 180 tablet, Rfl: 0   metoprolol tartrate (LOPRESSOR) 25 MG tablet, Take 1 tablet (25 mg total) by mouth 2 (two) times daily., Disp: 180 tablet, Rfl: 3   OVER THE COUNTER MEDICATION, Take 1 tablet by mouth daily. Instaflex featuring UC-II Collagen/ Relieves discomfort, improves flexibility, Disp: , Rfl:    tadalafil (CIALIS) 5 MG tablet, Take 5 mg by mouth daily as needed for erectile dysfunction., Disp: , Rfl:    vitamin B-12 (CYANOCOBALAMIN) 1000 MCG tablet, Take 1,000 mcg by mouth daily., Disp: , Rfl:    Zinc 50 MG CAPS, Take 50 mg by mouth daily., Disp: , Rfl:    nitroGLYCERIN (NITROSTAT) 0.4 MG SL tablet, Place 1 tablet (0.4 mg total) under the tongue every 5 (five) minutes as needed. (Patient taking differently: Place 0.4 mg under the tongue every 5 (five) minutes as needed for chest pain.), Disp: 25 tablet, Rfl: 7  I spent 62 minutes dedicated to the care of this patient on the date of this encounter to include pre-visit review of records, face-to-face time with the patient discussing conditions above, post visit ordering of testing, clinical documentation with the electronic health record, making appropriate referrals as documented, and communicating necessary findings to members of the patients care team.   Garner Nash, DO [facility] 02/04/2022 3:03 PM

## 2022-02-04 NOTE — Telephone Encounter (Signed)
I met the patient on Friday, 8/4.  I told him I will call him today to review what we discussed at the multidisciplinary conference this morning.  Unable to reach him.  Left a detailed voicemail on his mobile number as well as his wife's mobile number with instructions to call back so we can discuss the recommendation.  Home phone number kept ringing without going to voicemail.  In summary, we have placed a referral to pulmonary medicine Dr. Icard for consideration of bronchoscopy to try to biopsy 2 of the largest right upper lobe pulmonary nodules.  I have canceled the order to the CT-guided biopsy.  Also was going to remind him to ensure he gets his PET scan (which is scheduled for today) done as scheduled.  I left a callback number to our clinic as well as my direct office number. 

## 2022-02-04 NOTE — Telephone Encounter (Signed)
Please see mychart. Thanks

## 2022-02-04 NOTE — Patient Instructions (Addendum)
Thank you for visiting Dr. Valeta Harms at [facility]. Today we recommend the following:  Orders Placed This Encounter  Procedures   Procedural/ Surgical Case Request: ROBOTIC ASSISTED NAVIGATIONAL BRONCHOSCOPY, VIDEO BRONCHOSCOPY WITH ENDOBRONCHIAL ULTRASOUND   Ambulatory referral to Pulmonology   Bronchscopy on 8/15 STOP ELIQUIS ON 8/12  Return in about 12 days (around 02/16/2022) for with Eric Form, NP.    Please do your part to reduce the spread of COVID-19.

## 2022-02-04 NOTE — Telephone Encounter (Signed)
I spoke to the patient and his wife. They already received a call from Dr. Fabio Bering office about an appointment and are getting ready to head over there now. I reviewed what we discussed at conference. Dr. Valeta Harms will discuss the logistics of bronchoscopy at their consultation today. I also let them know I would personally call them with the results of the PET scan. I will ensure to make sure Dr. Burr Medico and Dr. Valeta Harms are aware of the results as well. The patient and his wife were appreciative of the call and verbalized understanding of the plan.

## 2022-02-04 NOTE — Progress Notes (Signed)
Per Dr Ernestina Penna request patient scheduled for follow up appt on 8/18 at 1205.  I left a vm for patient regarding this appt date and time.  I requested he return my call.

## 2022-02-04 NOTE — H&P (View-Only) (Signed)
Synopsis: Referred in August 2023 for multiple pulmonary nodules by Angelina Sheriff, MD  Subjective:   PATIENT ID: Thomas Knight GENDER: male DOB: Feb 19, 1946, MRN: [id number]  Chief Complaint  Patient presents with   Consult    Referred due to recent [place] conference and is here to review results.  Pt states that he has an occasional cough that will usually occur if he has a cold. Denies any complaints of SOB.    This is a 76 year old gentleman, past medical history of hypertension, hyperlipidemia, BMI 30, persistent atrial fibrillation currently on Eliquis.  Patient had a recent diagnosis of colon cancer and was taken for a right hemicolectomy.Surgical pathology on 01/09/2022 revealed a 3 cm adenocarcinoma moderately differentiated margins were uninvolved.  There were 17 lymph nodes.  These were negative.  Patient was pathologic stage classification of a pT3 pN0 M0.  Patient had a staging CT chest abdomen and pelvis complete.  This was done with contrast.  He was found to have a new right sided 11 mm pulmonary nodule.  Additionally there was some new nodular lymph nodes in the central mesentery.  There was slightly increased size of the retroperitoneal lymph nodes.  Patient's case was reviewed this morning in medical thoracic oncology multidisciplinary tumor conference.  Lesions within the chest were deemed necessary for consideration of tissue biopsy due to the patient's history of colorectal cancer.  Patient was brought urgently to clinic to discuss next steps regarding tissue biopsy.    Past Medical History:  Diagnosis Date   Bilateral leg edema 08/24/2019   Cancer ([place])    skin ca, colon   Chronic cough    Chronic pain of left knee 12/01/2015   Dysrhythmia    afib  was cardioverted   Essential hypertension 08/24/2019   Mixed hyperlipidemia 08/24/2019   Obesity (BMI 30-39.9) 08/24/2019   Persistent atrial fibrillation ([place]) 08/24/2019     Family History  Problem Relation Age  of Onset   Lung cancer Father      Past Surgical History:  Procedure Laterality Date   ACHILLES TENDON REPAIR Left 2012   Ruptured   COLONOSCOPY WITH PROPOFOL N/A 12/11/2021   Procedure: COLONOSCOPY WITH PROPOFOL;  Surgeon: Carol Ada, MD;  Location: [facility];  Service: Gastroenterology;  Laterality: N/A;   HERNIA REPAIR     umbilical hernia   SKIN SURGERY Left    Basal cell carcinoma on left forearm   XI ROBOTIC ASSISTED LOWER ANTERIOR RESECTION N/A 01/09/2021   Procedure: XI ROBOTIC ASSISTED RIGHT HEMI COLECTOMY, LYSIS OF ADHESIONS, SMALL BOWEL RESECTION, INTRAOPERATIVE ASSESMENT OF PERFUSION, PARTIAL OMENTECTOMY;  Surgeon: Ileana Roup, MD;  Location: [facility];  Service: General;  Laterality: N/A;    Social History   Socioeconomic History   Marital status: Married    Spouse name: Not on file   Number of children: Not on file   Years of education: Not on file   Highest education level: Not on file  Occupational History   Not on file  Tobacco Use   Smoking status: Never   Smokeless tobacco: Never  Vaping Use   Vaping Use: Never used  Substance and Sexual Activity   Alcohol use: Never   Drug use: Never   Sexual activity: Not Currently  Other Topics Concern   Not on file  Social History Narrative   Not on file   Social Determinants of Health   Financial Resource Strain: Not on file  Food Insecurity: Not on file  Transportation Needs: Not on file  Physical Activity: Not on file  Stress: Not on file  Social Connections: Not on file  Intimate Partner Violence: Not on file     No Known Allergies   Outpatient Medications Prior to Visit  Medication Sig Dispense Refill   Ascorbic Acid (VITAMIN C WITH ROSE HIPS) 500 MG tablet Take 500 mg by mouth daily.     Cholecalciferol (D3-1000) 25 MCG (1000 UT) capsule Take 1,000 Units by mouth daily.     ELIQUIS 5 MG TABS tablet Take 1 tablet (5 mg total) by mouth 2 (two) times daily. 180 tablet 0   metoprolol  tartrate (LOPRESSOR) 25 MG tablet Take 1 tablet (25 mg total) by mouth 2 (two) times daily. 180 tablet 3   OVER THE COUNTER MEDICATION Take 1 tablet by mouth daily. Instaflex featuring UC-II Collagen/ Relieves discomfort, improves flexibility     tadalafil (CIALIS) 5 MG tablet Take 5 mg by mouth daily as needed for erectile dysfunction.     vitamin B-12 (CYANOCOBALAMIN) 1000 MCG tablet Take 1,000 mcg by mouth daily.     Zinc 50 MG CAPS Take 50 mg by mouth daily.     nitroGLYCERIN (NITROSTAT) 0.4 MG SL tablet Place 1 tablet (0.4 mg total) under the tongue every 5 (five) minutes as needed. (Patient taking differently: Place 0.4 mg under the tongue every 5 (five) minutes as needed for chest pain.) 25 tablet 7   No facility-administered medications prior to visit.    Review of Systems  Constitutional:  Negative for chills, fever, malaise/fatigue and weight loss.  HENT:  Negative for hearing loss, sore throat and tinnitus.   Eyes:  Negative for blurred vision and double vision.  Respiratory:  Negative for cough, hemoptysis, sputum production, shortness of breath, wheezing and stridor.   Cardiovascular:  Negative for chest pain, palpitations, orthopnea, leg swelling and PND.  Gastrointestinal:  Negative for abdominal pain, constipation, diarrhea, heartburn, nausea and vomiting.  Genitourinary:  Negative for dysuria, hematuria and urgency.  Musculoskeletal:  Negative for joint pain and myalgias.  Skin:  Negative for itching and rash.  Neurological:  Negative for dizziness, tingling, weakness and headaches.  Endo/Heme/Allergies:  Negative for environmental allergies. Does not bruise/bleed easily.  Psychiatric/Behavioral:  Negative for depression. The patient is not nervous/anxious and does not have insomnia.   All other systems reviewed and are negative.    Objective:  Physical Exam Vitals reviewed.  Constitutional:      General: He is not in acute distress.    Appearance: He is  well-developed. He is obese.  HENT:     Head: Normocephalic and atraumatic.  Eyes:     General: No scleral icterus.    Conjunctiva/sclera: Conjunctivae normal.     Pupils: Pupils are equal, round, and reactive to light.  Neck:     Vascular: No JVD.     Trachea: No tracheal deviation.  Cardiovascular:     Rate and Rhythm: Normal rate and regular rhythm.     Heart sounds: Normal heart sounds. No murmur heard. Pulmonary:     Effort: Pulmonary effort is normal. No tachypnea, accessory muscle usage Knight respiratory distress.     Breath sounds: No stridor. No wheezing, rhonchi Knight rales.  Abdominal:     General: There is no distension.     Palpations: Abdomen is soft.     Tenderness: There is no abdominal tenderness.  Musculoskeletal:        General: No tenderness.  Cervical back: Neck supple.  Lymphadenopathy:     Cervical: No cervical adenopathy.  Skin:    General: Skin is warm and dry.     Capillary Refill: Capillary refill takes less than 2 seconds.     Findings: No rash.  Neurological:     Mental Status: He is alert and oriented to person, place, and time.  Psychiatric:        Behavior: Behavior normal.      Vitals:   02/04/22 1036  BP: 126/80  Pulse: 64  Temp: 97.6 F (36.4 C)  TempSrc: Oral  SpO2: 98%  Weight: 249 lb 9.6 oz (113.2 kg)  Height: 5' 11.5" (1.816 m)   98% on RA BMI Readings from Last 3 Encounters:  02/04/22 34.33 kg/m  01/29/22 37.41 kg/m  12/11/21 37.44 kg/m   Wt Readings from Last 3 Encounters:  02/04/22 249 lb 9.6 oz (113.2 kg)  01/29/22 253 lb 4.8 oz (114.9 kg)  12/11/21 253 lb 8.5 oz (115 kg)     CBC    Component Value Date/Time   WBC 11.0 (H) 01/12/2021 0408   RBC 4.12 (L) 01/12/2021 0408   HGB 11.5 (L) 01/12/2021 0408   HGB 16.5 08/01/2019 0959   HCT 36.3 (L) 01/12/2021 0408   HCT 47.4 08/01/2019 0959   PLT 242 01/12/2021 0408   PLT 198 08/01/2019 0959   MCV 88.1 01/12/2021 0408   MCV 90 08/01/2019 0959   MCH 27.9  01/12/2021 0408   MCHC 31.7 01/12/2021 0408   RDW 15.9 (H) 01/12/2021 0408   RDW 13.0 08/01/2019 0959   LYMPHSABS 1.4 12/23/2020 1103   MONOABS 0.8 12/23/2020 1103   EOSABS 0.1 12/23/2020 1103   BASOSABS 0.1 12/23/2020 1103      Chest Imaging: July CT chest 2023: Right upper lobe pulmonary nodules. The patient's images have been independently reviewed by me.    Pulmonary Functions Testing Results:     No data to display          FeNO:   Pathology:   Echocardiogram:   Heart Catheterization:     Assessment & Plan:     ICD-10-CM   1. Lung nodule  R91.1 Procedural/ Surgical Case Request: ROBOTIC ASSISTED NAVIGATIONAL BRONCHOSCOPY, VIDEO BRONCHOSCOPY WITH ENDOBRONCHIAL ULTRASOUND    Ambulatory referral to Pulmonology    CT Super D Chest Wo Contrast    CANCELED: Ambulatory referral to Pulmonology    2. Malignant neoplasm of colon, unspecified part of colon ([place])  C18.9     3. PAF (paroxysmal atrial fibrillation) ([facility])  I48.0     4. On continuous oral anticoagulation  Z79.01       Discussion:  This is a 76 year old gentleman, past medical history of colon cancer status post hemicolectomy, 17 nodes negative however was a large lesion at the time of resection.  Found to have pulmonary nodules new in existence after follow-up imaging by medical oncology.  These were concerning for metastatic disease patient was referred for consideration of robotic assisted navigation bronchoscopy tissue sampling.  Plan: Patient has nuclear medicine pet imaging scheduled for today. We will tentatively schedule bronchoscopy for next week on Tuesday. Today in the office we talked about the risk benefits alternatives of proceeding with bronchoscopy versus percutaneous needle biopsy with interventional radiology. Patient's bronchoscopy will be on 02/09/2022. Follow-up with [place] in approximately 1 week after bronchoscopy with tissue sampling. To go over path results.  Additional time  spent this morning discussing case and medical thoracic oncology conference.  As well as discussing appropriate diagnostic algorithm with patient's family and reviewing CT imaging.  Additional time also spent coordinating and planning for bronchoscopy next week.  We appreciate the PCC's help with scheduling.   Current Outpatient Medications:    Ascorbic Acid (VITAMIN C WITH ROSE HIPS) 500 MG tablet, Take 500 mg by mouth daily., Disp: , Rfl:    Cholecalciferol (D3-1000) 25 MCG (1000 UT) capsule, Take 1,000 Units by mouth daily., Disp: , Rfl:    ELIQUIS 5 MG TABS tablet, Take 1 tablet (5 mg total) by mouth 2 (two) times daily., Disp: 180 tablet, Rfl: 0   metoprolol tartrate (LOPRESSOR) 25 MG tablet, Take 1 tablet (25 mg total) by mouth 2 (two) times daily., Disp: 180 tablet, Rfl: 3   OVER THE COUNTER MEDICATION, Take 1 tablet by mouth daily. Instaflex featuring UC-II Collagen/ Relieves discomfort, improves flexibility, Disp: , Rfl:    tadalafil (CIALIS) 5 MG tablet, Take 5 mg by mouth daily as needed for erectile dysfunction., Disp: , Rfl:    vitamin B-12 (CYANOCOBALAMIN) 1000 MCG tablet, Take 1,000 mcg by mouth daily., Disp: , Rfl:    Zinc 50 MG CAPS, Take 50 mg by mouth daily., Disp: , Rfl:    nitroGLYCERIN (NITROSTAT) 0.4 MG SL tablet, Place 1 tablet (0.4 mg total) under the tongue every 5 (five) minutes as needed. (Patient taking differently: Place 0.4 mg under the tongue every 5 (five) minutes as needed for chest pain.), Disp: 25 tablet, Rfl: 7  I spent 62 minutes dedicated to the care of this patient on the date of this encounter to include pre-visit review of records, face-to-face time with the patient discussing conditions above, post visit ordering of testing, clinical documentation with the electronic health record, making appropriate referrals as documented, and communicating necessary findings to members of the patients care team.   Garner Nash, DO [facility] 02/04/2022 3:03 PM

## 2022-02-05 ENCOUNTER — Telehealth: Payer: Self-pay | Admitting: Physician Assistant

## 2022-02-05 ENCOUNTER — Other Ambulatory Visit: Payer: Self-pay | Admitting: *Deleted

## 2022-02-05 NOTE — Progress Notes (Signed)
I spoke with Mr Huckeby regarding the appt I made yesterday for him with Dr Burr Medico next week.  He is able to be here at that time.

## 2022-02-05 NOTE — Telephone Encounter (Signed)
I called the patient to review the results of his PET scan. I talked to him, his daughter, and his wife. The right lung nodules did not show any hypermetabolic activity and remains indeterminate. I reviewed with Dr. Burr Medico, who still feels it is beneficial for biopsy to confirm whether this is benign or malignant. The patient is scheduled for bronchoscopy on Tuesday. I will forward the results to Dr. Valeta Harms to review. The patient also ask that I forward this to his surgeon, Dr. Dema Severin.

## 2022-02-05 NOTE — Progress Notes (Signed)
The proposed treatment discussed in conference is for discussion purpose only and is not a binding recommendation.  The patients have not been physically examined, or presented with their treatment options.  Therefore, final treatment plans cannot be decided.  

## 2022-02-08 ENCOUNTER — Other Ambulatory Visit: Payer: Self-pay

## 2022-02-08 ENCOUNTER — Encounter (HOSPITAL_COMMUNITY): Payer: Self-pay | Admitting: Pulmonary Disease

## 2022-02-08 DIAGNOSIS — C189 Malignant neoplasm of colon, unspecified: Secondary | ICD-10-CM | POA: Diagnosis not present

## 2022-02-08 NOTE — Progress Notes (Signed)
Anesthesia Chart Review: SAME DAY WORK-UP  Case: [id number] Date/Time: 02/09/22 0930   Procedures:      ROBOTIC ASSISTED NAVIGATIONAL BRONCHOSCOPY (Bilateral) - ION w/ CIOS     VIDEO BRONCHOSCOPY WITH ENDOBRONCHIAL ULTRASOUND (Bilateral)   Anesthesia type: General   Diagnosis: Lung nodule [R91.1]   Pre-op diagnosis: lung nodules   Location: [facility] / [facility]   Surgeons: Garner Nash, DO       DISCUSSION: Patient is a 76 year old male scheduled for the above procedure. He is s/p right hemicolectomy 01/09/21 for adenocarcinoma. Recent staging CT revealed a 11 mm right pulmonary nodule and some new nodular lymph nodes in the central mesentery. 02/04/22 PET scan did not show any hypermetabolism of the lung nodules or mesenteric lymph nodes, but given history biopsy of lung lesion recommended for tissue diagnosis to guide management.   History includes never smoker, HTN, afib (07/08/19 in setting of cough/COVID-19, s/p DCCV 08/30/19), HLD, LE edema, chronic cough, colon cancer (pT3 pN0 M0, s/p robotic assisted right hemicolectomy, small bowel resection with excision of involved paraumbilical mesh 1/88/41), skin cancer. Minimal CAD on 02/25/20 CCTA.  Last office visit with cardiologist Dr. Harriet Masson was on 05/05/21.  He was doing well from a cardiovascular standpoint.  Weight loss advised but otherwise continue current medication regimen.  1 year follow-up planned.  He is planning to transition his care to their [place] location with Dr. Shirlee More. He did have telemedicine preoperative evaluation on 11/17/21 with Florene Glen, NP prior to his colonoscopy and was advised to hold Eliquis for 2 days for that procedure.  He reported last Eliquis 02/06/2022 for this procedure.  He is for COVID-19 testing on arrival.  Anesthesia team to evaluate on the day of surgery. Last EKG available in > 1 year ago and labs > 30 days ago.   VS: Ht 5\' 11"  (1.803 m)   Wt 112.5 kg   BMI 34.59 kg/m   BP Readings from Last 3 Encounters:  02/04/22 126/80  01/29/22 (!) 184/69  12/11/21 (!) 166/78   Pulse Readings from Last 3 Encounters:  02/04/22 64  01/29/22 (!) 58  12/11/21 (!) 58     PROVIDERS: Angelina Sheriff, MD is PCP Berniece Salines, DO is cardiologist Carol Ada, MD is GI June Leap, DO is pulmonologist Nadeen Landau, MD is general surgeon Heilingoetter, Cassandra, PA-C/Feng, Krista Blue, MD is oncology provider   LABS: Labs on arrival as indicated. Last results in [facility] include: Lab Results  Component Value Date   WBC 11.0 (H) 01/12/2021   HGB 11.5 (L) 01/12/2021   HCT 36.3 (L) 01/12/2021   PLT 242 01/12/2021   GLUCOSE 135 (H) 01/12/2021   ALT 23 12/23/2020   AST 23 12/23/2020   NA 138 01/12/2021   K 4.1 01/12/2021   CL 102 01/12/2021   CREATININE 0.89 01/12/2021   BUN 13 01/12/2021   CO2 28 01/12/2021   INR 1.0 12/23/2020   HGBA1C 6.1 (H) 12/23/2020     IMAGES: PET Scan 02/04/22: IMPRESSION: 1. The right lung pulmonary nodules do not show any hypermetabolism. These remain indeterminate. Possibilities would include biopsy or observation (follow-up noncontrast chest CT in 3 months and if they enlarge, then proceeded to biopsy). 2. No findings for abdominal/pelvic metastatic disease. The mesenteric lymph nodes are not hypermetabolic and likely reactive. These could also be followed.   CT chest/abd/pelvis 01/25/22: IMPRESSION: 1. New right-sided pulmonary nodules measure up to 11 mm, nonspecific but concerning  for metastatic disease. Consider further evaluation with nuclear medicine PET/CT. 2. New nodularity/lymph nodes in the central mesentery, concerning for nodal disease involvement. 3. Slightly increased size of prominent retroperitoneal lymph nodes, nonspecific but also somewhat concerning for nodal disease involvement. 4. Surgical change of partial colectomy without evidence of local recurrence. 5. Stable prominent mediastinal lymph  nodes, nonspecific but stability is reassuring. Attention on follow-up imaging suggested. 6. Stable prominent right external iliac lymph node, nonspecific but stability is reassuring. Attention on follow-up imaging suggested. 7. Stable hypodense 8 mm hepatic lesion technically too small to accurately characterize but stability is reassuring. Attention on follow-up imaging suggested. 8. 1.9 cm incidental left thyroid nodule. Recommend thyroid US. Reference: J Am Coll Radiol. 2015 Feb;12(2): 143-50 9.  Aortic Atherosclerosis (ICD10-I70.0).   EKG: Last EKG noted is from 01/10/20: SB at 52 bpm. First degree AV block.   CV: CT Coronary 02/25/20: - Aorta: Normal size. Scattered calcifications in the aortic arch. No dissection. - Aortic Valve:  Trileaflet.  No calcifications. - Coronary Arteries:  Normal coronary origin.  Left dominance. - RCA is a small non-dominant artery that give rise to an acute RVM branch. There is no plaque. - Left main is a short artery that gives rise to LAD and LCX arteries. There is no plaque. - LAD is a large vessel that gives rise to a large branching diagonal. There is minimal calcified plaque in the proximal and mid LAD with associated stenosis of 0-24%. - LCX is a dominant artery that gives rise to one large OM1 branch and PDA. There is no plaque. - Other findings: Normal pulmonary vein drainage into the left atrium. Normal let atrial appendage without a thrombus. Normal size of the pulmonary artery.  IMPRESSION: 1. Coronary calcium score of 12. This was 13th percentile for age and sex matched control. 2.  Normal coronary origin with right dominance. 3.  Minimal atherosclerosis.  CAD RADS 1. 4.  Consider non-atherosclerotic causes of chest pain. 5.  Recommend preventive therapy and risk factor modification.   Echo 07/08/19 [facility]): Conclusions: 1.  Atrial fibrillation. 2.  This was a technically difficult study with suboptimal  views. 3.  There is normal global left ventricular contractility. 4.  Overall left ventricular systolic function is normal with an EF between 55 and 60%. 5.  The right ventricle is mildly enlarged. 6.  The left atrium is moderately dilated. 7.  The right atrium is normal in size and function. 8.  There is mild aortic valve sclerosis. 9.  There is trace mitral regurgitation. 10.  Mildish tricuspid regurgitation present. 11.  The pulmonic valve was not well visualized. 12.  The aortic root, ascending aorta and aortic arch are not well visualized. 13.  Echo-free space may represent effusion or pericardial fat pad.  Past Medical History:  Diagnosis Date   Bilateral leg edema 08/24/2019   Cancer ([place])    skin ca, colon   Chronic cough    Chronic pain of left knee 12/01/2015   COVID-19    06/2019   Dysrhythmia    afib  was cardioverted   Essential hypertension 08/24/2019   Mixed hyperlipidemia 08/24/2019   Obesity (BMI 30-39.9) 08/24/2019   Persistent atrial fibrillation ([place]) 08/24/2019    Past Surgical History:  Procedure Laterality Date   ACHILLES TENDON REPAIR Left 2012   Ruptured   COLONOSCOPY WITH PROPOFOL N/A 12/11/2021   Procedure: COLONOSCOPY WITH PROPOFOL;  Surgeon: Carol Ada, MD;  Location: [facility];  Service: Gastroenterology;  Laterality: N/A;   HERNIA REPAIR     umbilical hernia   SKIN SURGERY Left    Basal cell carcinoma on left forearm   XI ROBOTIC ASSISTED LOWER ANTERIOR RESECTION N/A 01/09/2021   Procedure: XI ROBOTIC ASSISTED RIGHT HEMI COLECTOMY, LYSIS OF ADHESIONS, SMALL BOWEL RESECTION, INTRAOPERATIVE ASSESMENT OF PERFUSION, PARTIAL OMENTECTOMY;  Surgeon: Ileana Roup, MD;  Location: [facility];  Service: General;  Laterality: N/A;    MEDICATIONS: No current facility-administered medications for this encounter.    acetaminophen (TYLENOL) 500 MG tablet   Ascorbic Acid (VITAMIN C WITH ROSE HIPS) 500 MG tablet   Cholecalciferol (D3-1000) 25 MCG  (1000 UT) capsule   ELIQUIS 5 MG TABS tablet   metoprolol tartrate (LOPRESSOR) 25 MG tablet   Misc Natural Products (JOINT SUPPORT PO)   nitroGLYCERIN (NITROSTAT) 0.4 MG SL tablet   vitamin B-12 (CYANOCOBALAMIN) 1000 MCG tablet   Zinc 50 MG CAPS    Myra Gianotti, PA-C Surgical Short Stay/Anesthesiology [facility] Phone [phone] [facility] Phone [phone] 02/08/2022 12:12 PM

## 2022-02-08 NOTE — Progress Notes (Signed)
PCP - Jacqlyn Larsen II, MD Cardiologist - Berniece Salines, DO  ECHO - 07/08/19  Blood Thinner Instructions: Eliquis last dose 02/06/22 AM  ERAS Protcol - Clear liquids until 0630  COVID TEST- DOS  Anesthesia review: Y  Patient verbally denies any shortness of breath, fever, cough and chest pain during phone call   -------------  SDW INSTRUCTIONS given:  Your procedure is scheduled on 02/09/22.  Report to [facility] Main Entrance "A" at [email or web address] and check in at the Admitting office.  Call this number if you have problems the morning of surgery:  [phone]   Remember:  Do not eat after midnight the night before your surgery    Take these medicines the morning of surgery with A SIP OF WATER  Metoprolol  As of today, STOP taking any Aspirin (unless otherwise instructed by your surgeon) Aleve, Naproxen, Ibuprofen, Motrin, Advil, Goody's, BC's, all herbal medications, fish oil, and all vitamins.                      Do not wear jewelry, make up, or nail polish            Do not wear lotions, powders, perfumes/colognes, or deodorant.            Do not shave 48 hours prior to surgery.  Men may shave face and neck.            Do not bring valuables to the hospital.            [facility] is not responsible for any belongings or valuables.  Do NOT Smoke (Tobacco/Vaping) 24 hours prior to your procedure If you use a CPAP at night, you may bring all equipment for your overnight stay.   Contacts, glasses, dentures or bridgework may not be worn into surgery.      For patients admitted to the hospital, discharge time will be determined by your treatment team.   Patients discharged the day of surgery will not be allowed to drive home, and someone needs to stay with them for 24 hours.    Special instructions:   - Preparing For Surgery  Before surgery, you can play an important role. Because skin is not sterile, your skin needs to be as free of germs as possible. You  can reduce the number of germs on your skin by washing with CHG (chlorahexidine gluconate) Soap before surgery.  CHG is an antiseptic cleaner which kills germs and bonds with the skin to continue killing germs even after washing.    Oral Hygiene is also important to reduce your risk of infection.  Remember - BRUSH YOUR TEETH THE MORNING OF SURGERY WITH YOUR REGULAR TOOTHPASTE  Please do not use if you have an allergy to CHG or antibacterial soaps. If your skin becomes reddened/irritated stop using the CHG.  Do not shave (including legs and underarms) for at least 48 hours prior to first CHG shower. It is OK to shave your face.  Please follow these instructions carefully.   Shower the NIGHT BEFORE SURGERY and the MORNING OF SURGERY with DIAL Soap.   Pat yourself dry with a CLEAN TOWEL.  Wear CLEAN PAJAMAS to bed the night before surgery  Place CLEAN SHEETS on your bed the night of your first shower and DO NOT SLEEP WITH PETS.   Day of Surgery: Please shower morning of surgery  Wear Clean/Comfortable clothing the morning of surgery Do not apply any deodorants/lotions.  Remember to brush your teeth WITH YOUR REGULAR TOOTHPASTE.   Questions were answered. Patient verbalized understanding of instructions.

## 2022-02-08 NOTE — Anesthesia Preprocedure Evaluation (Signed)
Anesthesia Evaluation  Patient identified by MRN, date of birth, ID band Patient awake    Reviewed: Allergy & Precautions, H&P , NPO status , Patient's Chart, lab work & pertinent test results  Airway Mallampati: III  TM Distance: >3 FB Neck ROM: Full    Dental no notable dental hx. (+) Teeth Intact, Dental Advisory Given   Pulmonary    Pulmonary exam normal breath sounds clear to auscultation       Cardiovascular hypertension, Pt. on medications + dysrhythmias Atrial Fibrillation  Rhythm:Regular Rate:Normal     Neuro/Psych negative neurological ROS  negative psych ROS   GI/Hepatic negative GI ROS, Neg liver ROS,   Endo/Other  negative endocrine ROS  Renal/GU negative Renal ROS  negative genitourinary   Musculoskeletal   Abdominal   Peds  Hematology negative hematology ROS (+)   Anesthesia Other Findings   Reproductive/Obstetrics negative OB ROS                           Anesthesia Physical Anesthesia Plan  ASA: 3  Anesthesia Plan: General   Post-op Pain Management: Tylenol PO (pre-op)*   Induction: Intravenous  PONV Risk Score and Plan: 3 and Ondansetron, Dexamethasone, TIVA and Propofol infusion  Airway Management Planned: Oral ETT  Additional Equipment:   Intra-op Plan:   Post-operative Plan: Extubation in OR  Informed Consent: I have reviewed the patients History and Physical, chart, labs and discussed the procedure including the risks, benefits and alternatives for the proposed anesthesia with the patient or authorized representative who has indicated his/her understanding and acceptance.     Dental advisory given  Plan Discussed with: CRNA  Anesthesia Plan Comments: (PAT note written 02/08/2022 by Myra Gianotti, PA-C. )       Anesthesia Quick Evaluation

## 2022-02-09 ENCOUNTER — Ambulatory Visit (HOSPITAL_COMMUNITY)
Admission: RE | Admit: 2022-02-09 | Discharge: 2022-02-09 | Disposition: A | Discharge status: Home / Self Care | Payer: Medicare HMO | Attending: Pulmonary Disease | Admitting: Pulmonary Disease

## 2022-02-09 ENCOUNTER — Other Ambulatory Visit: Payer: Self-pay

## 2022-02-09 ENCOUNTER — Encounter (HOSPITAL_COMMUNITY): Payer: Self-pay | Admitting: Pulmonary Disease

## 2022-02-09 ENCOUNTER — Ambulatory Visit (HOSPITAL_COMMUNITY): Payer: Medicare HMO | Admitting: Vascular Surgery

## 2022-02-09 ENCOUNTER — Encounter (HOSPITAL_COMMUNITY)
Admission: RE | Disposition: A | Discharge status: Home / Self Care | Payer: Self-pay | Source: Home / Self Care | Attending: Pulmonary Disease

## 2022-02-09 ENCOUNTER — Ambulatory Visit (HOSPITAL_COMMUNITY): Payer: Medicare HMO

## 2022-02-09 ENCOUNTER — Ambulatory Visit (HOSPITAL_BASED_OUTPATIENT_CLINIC_OR_DEPARTMENT_OTHER): Payer: Medicare HMO | Admitting: Vascular Surgery

## 2022-02-09 DIAGNOSIS — C3411 Malignant neoplasm of upper lobe, right bronchus or lung: Secondary | ICD-10-CM | POA: Diagnosis not present

## 2022-02-09 DIAGNOSIS — R911 Solitary pulmonary nodule: Secondary | ICD-10-CM | POA: Diagnosis present

## 2022-02-09 DIAGNOSIS — R918 Other nonspecific abnormal finding of lung field: Secondary | ICD-10-CM | POA: Insufficient documentation

## 2022-02-09 DIAGNOSIS — I4891 Unspecified atrial fibrillation: Secondary | ICD-10-CM

## 2022-02-09 DIAGNOSIS — E669 Obesity, unspecified: Secondary | ICD-10-CM | POA: Diagnosis not present

## 2022-02-09 DIAGNOSIS — Z6830 Body mass index (BMI) 30.0-30.9, adult: Secondary | ICD-10-CM | POA: Diagnosis not present

## 2022-02-09 DIAGNOSIS — Z9049 Acquired absence of other specified parts of digestive tract: Secondary | ICD-10-CM | POA: Insufficient documentation

## 2022-02-09 DIAGNOSIS — Z7901 Long term (current) use of anticoagulants: Secondary | ICD-10-CM | POA: Insufficient documentation

## 2022-02-09 DIAGNOSIS — Z20822 Contact with and (suspected) exposure to covid-19: Secondary | ICD-10-CM | POA: Diagnosis not present

## 2022-02-09 DIAGNOSIS — I4819 Other persistent atrial fibrillation: Secondary | ICD-10-CM | POA: Insufficient documentation

## 2022-02-09 DIAGNOSIS — I119 Hypertensive heart disease without heart failure: Secondary | ICD-10-CM | POA: Diagnosis not present

## 2022-02-09 DIAGNOSIS — I10 Essential (primary) hypertension: Secondary | ICD-10-CM | POA: Diagnosis not present

## 2022-02-09 DIAGNOSIS — C189 Malignant neoplasm of colon, unspecified: Secondary | ICD-10-CM | POA: Insufficient documentation

## 2022-02-09 DIAGNOSIS — E785 Hyperlipidemia, unspecified: Secondary | ICD-10-CM | POA: Insufficient documentation

## 2022-02-09 HISTORY — PX: BRONCHIAL NEEDLE ASPIRATION BIOPSY: SHX5106

## 2022-02-09 HISTORY — DX: COVID-19: U07.1

## 2022-02-09 HISTORY — PX: BRONCHIAL BIOPSY: SHX5109

## 2022-02-09 LAB — CBC
HCT: 52.2 % — ABNORMAL HIGH (ref 39.0–52.0)
Hemoglobin: 17.1 g/dL — ABNORMAL HIGH (ref 13.0–17.0)
MCH: 31.2 pg (ref 26.0–34.0)
MCHC: 32.8 g/dL (ref 30.0–36.0)
MCV: 95.3 fL (ref 80.0–100.0)
Platelets: 193 10*3/uL (ref 150–400)
RBC: 5.48 MIL/uL (ref 4.22–5.81)
RDW: 14.7 % (ref 11.5–15.5)
WBC: 6.2 10*3/uL (ref 4.0–10.5)
nRBC: 0 % (ref 0.0–0.2)

## 2022-02-09 LAB — BASIC METABOLIC PANEL
Anion gap: 11 (ref 5–15)
BUN: 12 mg/dL (ref 8–23)
CO2: 19 mmol/L — ABNORMAL LOW (ref 22–32)
Calcium: 8.6 mg/dL — ABNORMAL LOW (ref 8.9–10.3)
Chloride: 108 mmol/L (ref 98–111)
Creatinine, Ser: 0.92 mg/dL (ref 0.61–1.24)
GFR, Estimated: >60 mL/min (ref >60)
Glucose, Bld: 106 mg/dL — ABNORMAL HIGH (ref 70–99)
Potassium: 4.3 mmol/L (ref 3.5–5.1)
Sodium: 138 mmol/L (ref 135–145)

## 2022-02-09 LAB — SARS CORONAVIRUS 2 BY RT PCR: SARS Coronavirus 2 by RT PCR: NEGATIVE

## 2022-02-09 SURGERY — BRONCHOSCOPY, WITH BIOPSY USING ELECTROMAGNETIC NAVIGATION
Anesthesia: General | Laterality: Bilateral

## 2022-02-09 MED ORDER — GLYCOPYRROLATE 0.2 MG/ML IJ SOLN
INTRAMUSCULAR | Status: DC | PRN
  Administered 2022-02-09: .2 mg via INTRAVENOUS

## 2022-02-09 MED ORDER — PROPOFOL 500 MG/50ML IV EMUL
INTRAVENOUS | Status: DC | PRN
  Administered 2022-02-09: 150 ug/kg/min via INTRAVENOUS

## 2022-02-09 MED ORDER — ACETAMINOPHEN 500 MG PO TABS
ORAL_TABLET | ORAL | Status: AC
  Administered 2022-02-09: 1000 mg via ORAL
  Filled 2022-02-09: qty 2

## 2022-02-09 MED ORDER — ONDANSETRON HCL 4 MG/2ML IJ SOLN
INTRAMUSCULAR | Status: DC | PRN
  Administered 2022-02-09: 4 mg via INTRAVENOUS

## 2022-02-09 MED ORDER — SUGAMMADEX SODIUM 200 MG/2ML IV SOLN
INTRAVENOUS | Status: DC | PRN
  Administered 2022-02-09: 200 mg via INTRAVENOUS

## 2022-02-09 MED ORDER — PHENYLEPHRINE HCL-NACL 20-0.9 MG/250ML-% IV SOLN
INTRAVENOUS | Status: DC | PRN
  Administered 2022-02-09: 25 ug/min via INTRAVENOUS

## 2022-02-09 MED ORDER — LACTATED RINGERS IV SOLN: INTRAVENOUS | Status: DC

## 2022-02-09 MED ORDER — PROPOFOL 10 MG/ML IV BOLUS
INTRAVENOUS | Status: DC | PRN
  Administered 2022-02-09: 150 mg via INTRAVENOUS

## 2022-02-09 MED ORDER — CHLORHEXIDINE GLUCONATE 0.12 % MT SOLN
OROMUCOSAL | Status: AC
  Filled 2022-02-09: qty 15

## 2022-02-09 MED ORDER — EPHEDRINE SULFATE (PRESSORS) 50 MG/ML IJ SOLN
INTRAMUSCULAR | Status: DC | PRN
  Administered 2022-02-09: 5 mg via INTRAVENOUS

## 2022-02-09 MED ORDER — SUCCINYLCHOLINE CHLORIDE 200 MG/10ML IV SOSY
PREFILLED_SYRINGE | INTRAVENOUS | Status: DC | PRN
  Administered 2022-02-09: 120 mg via INTRAVENOUS

## 2022-02-09 MED ORDER — ACETAMINOPHEN 500 MG PO TABS: 1000.0000 mg | ORAL_TABLET | Freq: Once | ORAL | Status: AC

## 2022-02-09 MED ORDER — ROCURONIUM BROMIDE 10 MG/ML (PF) SYRINGE
PREFILLED_SYRINGE | INTRAVENOUS | Status: DC | PRN
  Administered 2022-02-09: 20 mg via INTRAVENOUS
  Administered 2022-02-09: 60 mg via INTRAVENOUS

## 2022-02-09 MED ORDER — DEXAMETHASONE SODIUM PHOSPHATE 10 MG/ML IJ SOLN
INTRAMUSCULAR | Status: DC | PRN
  Administered 2022-02-09: 10 mg via INTRAVENOUS

## 2022-02-09 MED ORDER — FENTANYL CITRATE (PF) 250 MCG/5ML IJ SOLN
INTRAMUSCULAR | Status: DC | PRN
  Administered 2022-02-09: 100 ug via INTRAVENOUS

## 2022-02-09 MED ORDER — FENTANYL CITRATE (PF) 100 MCG/2ML IJ SOLN: 25.0000 ug | INTRAMUSCULAR | Status: DC | PRN

## 2022-02-09 MED ORDER — LIDOCAINE 2% (20 MG/ML) 5 ML SYRINGE
INTRAMUSCULAR | Status: DC | PRN
  Administered 2022-02-09: 60 mg via INTRAVENOUS

## 2022-02-09 SURGICAL SUPPLY — 30 items

## 2022-02-09 NOTE — Anesthesia Procedure Notes (Signed)
Procedure Name: Intubation Date/Time: 02/09/2022 9:23 AM  Performed by: Inda Coke, CRNAPre-anesthesia Checklist: Patient identified, Emergency Drugs available, Suction available, Timeout performed and Patient being monitored Patient Re-evaluated:Patient Re-evaluated prior to induction Oxygen Delivery Method: Circle system utilized Preoxygenation: Pre-oxygenation with 100% oxygen Induction Type: IV induction and Rapid sequence Ventilation: Mask ventilation without difficulty Laryngoscope Size: Mac and 4 Grade View: Grade I Tube type: Oral Tube size: 8.5 mm Number of attempts: 1 Airway Equipment and Method: Stylet Placement Confirmation: ETT inserted through vocal cords under direct vision, positive ETCO2, CO2 detector and breath sounds checked- equal and bilateral Secured at: 24 cm Tube secured with: Tape Dental Injury: Teeth and Oropharynx as per pre-operative assessment

## 2022-02-09 NOTE — Op Note (Signed)
Video Bronchoscopy with Robotic Assisted Bronchoscopic Navigation   Date of Operation: 02/09/2022   Pre-op Diagnosis: Pulmonary nodules  Post-op Diagnosis: Pulmonary nodules  Surgeon: Garner Nash, DO  Assistants: None   Anesthesia: General endotracheal anesthesia  Operation: Flexible video fiberoptic bronchoscopy with robotic assistance and biopsies.  Estimated Blood Loss: Minimal  Complications: None  Indications and History: Thomas Knight is a 76 y.o. male with history of lung nodules. The risks, benefits, complications, treatment options and expected outcomes were discussed with the patient.  The possibilities of pneumothorax, pneumonia, reaction to medication, pulmonary aspiration, perforation of a viscus, bleeding, failure to diagnose a condition and creating a complication requiring transfusion or operation were discussed with the patient who freely signed the consent.    Description of Procedure: The patient was seen in the Preoperative Area, was examined and was deemed appropriate to proceed.  The patient was taken to [facility] endoscopy room 3, identified as Thomas Knight and the procedure verified as Flexible Video Fiberoptic Bronchoscopy.  A Time Out was held and the above information confirmed.   Prior to the date of the procedure a high-resolution CT scan of the chest was performed. Utilizing ION software program a virtual tracheobronchial tree was generated to allow the creation of distinct navigation pathways to the patient's parenchymal abnormalities. After being taken to the operating room general anesthesia was initiated and the patient  was orally intubated. The video fiberoptic bronchoscope was introduced via the endotracheal tube and a general inspection was performed which showed normal right and left lung anatomy, aspiration of the bilateral mainstems was completed to remove any remaining secretions. Robotic catheter inserted into patient's endotracheal tube.   Target  #1 right upper lobe posterior nodule: The distinct navigation pathways prepared prior to this procedure were then utilized to navigate to patient's lesion identified on CT scan. The robotic catheter was secured into place and the vision probe was withdrawn.  Lesion location was approximated using fluoroscopy and three-dimensional cone beam CT imaging.  Patient's initial imaging showed significant posterior segment atelectasis in the right upper lobe under cone beam CT imaging.  The patient was repositioned into the left lateral decubitus and given 3 recruitment breaths.  The Ion robotic system was undocked and reregistered.  We navigated to the lesion under direction with cone beam imaging.  We were successful at identifying the lesion that was now visible following atelectasis reversal.  Please see tool and lesion imaging below.  Under fluoroscopic guidance transbronchial needle biopsies, and transbronchial forceps biopsies were performed to be sent for cytology and pathology.  At the end of the procedure a general airway inspection was performed and there was no evidence of active bleeding. The bronchoscope was removed.  The patient tolerated the procedure well. There was no significant blood loss and there were no obvious complications. A post-procedural chest x-ray is pending.  Samples Target #1: 1. Transbronchial Wang needle biopsies from RUL posterior 2. Transbronchial forceps biopsies from RUL posterior  Plans:  The patient will be discharged from the PACU to home when recovered from anesthesia and after chest x-ray is reviewed. We will review the cytology, pathology results with the patient when they become available. Outpatient followup will be with Octavio Graves [place] Kretz, DO.    Tool-in-lesion:

## 2022-02-09 NOTE — Discharge Instructions (Signed)
Flexible Bronchoscopy, Care After This sheet gives you information about how to care for yourself after your test. Your doctor may also give you more specific instructions. If you have problems or questions, contact your doctor. Follow these instructions at home: Eating and drinking Do not eat or drink anything (not even water) for 2 hours after your test, or until your numbing medicine (local anesthetic) wears off. When your numbness is gone and your cough and gag reflexes have come back, you may: Eat only soft foods. Slowly drink liquids. The day after the test, go back to your normal diet. Driving Do not drive for 24 hours if you were given a medicine to help you relax (sedative). Do not drive or use heavy machinery while taking prescription pain medicine. General instructions  Take over-the-counter and prescription medicines only as told by your doctor. Return to your normal activities as told. Ask what activities are safe for you. Do not use any products that have nicotine or tobacco in them. This includes cigarettes and e-cigarettes. If you need help quitting, ask your doctor. Keep all follow-up visits as told by your doctor. This is important. It is very important if you had a tissue sample (biopsy) taken. Get help right away if: You have shortness of breath that gets worse. You get light-headed. You feel like you are going to pass out (faint). You have chest pain. You cough up: More than a little blood. More blood than before. Summary Do not eat or drink anything (not even water) for 2 hours after your test, or until your numbing medicine wears off. Do not use cigarettes. Do not use e-cigarettes. Get help right away if you have chest pain.  This information is not intended to replace advice given to you by your health care provider. Make sure you discuss any questions you have with your health care provider. Document Released: 04/11/2009 Document Revised: 05/27/2017 Document  Reviewed: 07/02/2016 Elsevier Patient Education  2020 [employer].

## 2022-02-09 NOTE — Transfer of Care (Signed)
Immediate Anesthesia Transfer of Care Note  Patient: Hydrologist  Procedure(s) Performed: ROBOTIC ASSISTED NAVIGATIONAL BRONCHOSCOPY (Bilateral) BRONCHIAL NEEDLE ASPIRATION BIOPSIES BRONCHIAL BIOPSIES  Patient Location: PACU  Anesthesia Type:General  Level of Consciousness: awake and alert   Airway & Oxygen Therapy: Patient Spontanous Breathing and Patient connected to face mask oxygen  Post-op Assessment: Report given to RN and Post -op Vital signs reviewed and stable  Post vital signs: Reviewed and stable  Last Vitals:  Vitals Value Taken Time  BP 115/58 02/09/22 1056  Temp    Pulse 64 02/09/22 1058  Resp 10 02/09/22 1058  SpO2 94 % 02/09/22 1058  Vitals shown include unvalidated device data.  Last Pain:  Vitals:   02/09/22 0703  TempSrc: Oral  PainSc: 0-No pain         Complications: No notable events documented.

## 2022-02-09 NOTE — Interval H&P Note (Signed)
History and Physical Interval Note:  02/09/2022 9:04 AM  Thomas Knight  has presented today for surgery, with the diagnosis of lung nodules.  The various methods of treatment have been discussed with the patient and family. After consideration of risks, benefits and other options for treatment, the patient has consented to  Procedure(s) with comments: ROBOTIC ASSISTED NAVIGATIONAL BRONCHOSCOPY (Bilateral) - ION w/ CIOS VIDEO BRONCHOSCOPY WITH ENDOBRONCHIAL ULTRASOUND (Bilateral) as a surgical intervention.  The patient's history has been reviewed, patient examined, no change in status, stable for surgery.  I have reviewed the patient's chart and labs.  Questions were answered to the patient's satisfaction.     [place]

## 2022-02-09 NOTE — Anesthesia Postprocedure Evaluation (Signed)
Anesthesia Post Note  Patient: Hydrologist  Procedure(s) Performed: ROBOTIC ASSISTED NAVIGATIONAL BRONCHOSCOPY (Bilateral) BRONCHIAL NEEDLE ASPIRATION BIOPSIES BRONCHIAL BIOPSIES     Patient location during evaluation: PACU Anesthesia Type: General Level of consciousness: awake and alert Pain management: pain level controlled Vital Signs Assessment: post-procedure vital signs reviewed and stable Respiratory status: spontaneous breathing, nonlabored ventilation and respiratory function stable Cardiovascular status: blood pressure returned to baseline and stable Postop Assessment: no apparent nausea or vomiting Anesthetic complications: no   No notable events documented.  Last Vitals:  Vitals:   02/09/22 1140 02/09/22 1155  BP: (!) 146/75 (!) 140/73  Pulse: 63 (!) 58  Resp: 20 15  Temp:  36.9 C  SpO2: 95% 94%    Last Pain:  Vitals:   02/09/22 1155  TempSrc:   PainSc: 0-No pain                 Shantrice Rodenberg,W. EDMOND

## 2022-02-10 ENCOUNTER — Encounter (HOSPITAL_COMMUNITY): Payer: Self-pay | Admitting: Pulmonary Disease

## 2022-02-10 ENCOUNTER — Other Ambulatory Visit: Payer: Self-pay

## 2022-02-10 DIAGNOSIS — Z85038 Personal history of other malignant neoplasm of large intestine: Secondary | ICD-10-CM | POA: Diagnosis not present

## 2022-02-10 NOTE — Progress Notes (Signed)
The proposed treatment discussed in conference is for discussion purpose only and is not a binding recommendation.  The patients have not been physically examined, or presented with their treatment options.  Therefore, final treatment plans cannot be decided.  

## 2022-02-11 LAB — CYTOLOGY - NON PAP

## 2022-02-12 ENCOUNTER — Inpatient Hospital Stay (HOSPITAL_BASED_OUTPATIENT_CLINIC_OR_DEPARTMENT_OTHER): Payer: Medicare HMO | Admitting: Physician Assistant

## 2022-02-12 ENCOUNTER — Encounter: Payer: Self-pay | Admitting: Physician Assistant

## 2022-02-12 ENCOUNTER — Other Ambulatory Visit: Payer: Self-pay

## 2022-02-12 VITALS — BP 172/74 | HR 55 | Temp 98.1°F | Resp 20 | Wt 249.6 lb

## 2022-02-12 DIAGNOSIS — C7800 Secondary malignant neoplasm of unspecified lung: Secondary | ICD-10-CM | POA: Insufficient documentation

## 2022-02-12 DIAGNOSIS — I10 Essential (primary) hypertension: Secondary | ICD-10-CM | POA: Diagnosis not present

## 2022-02-12 DIAGNOSIS — C189 Malignant neoplasm of colon, unspecified: Secondary | ICD-10-CM

## 2022-02-12 DIAGNOSIS — C3492 Malignant neoplasm of unspecified part of left bronchus or lung: Secondary | ICD-10-CM | POA: Diagnosis not present

## 2022-02-12 DIAGNOSIS — K769 Liver disease, unspecified: Secondary | ICD-10-CM | POA: Diagnosis not present

## 2022-02-12 DIAGNOSIS — Z7189 Other specified counseling: Secondary | ICD-10-CM

## 2022-02-12 DIAGNOSIS — C787 Secondary malignant neoplasm of liver and intrahepatic bile duct: Secondary | ICD-10-CM

## 2022-02-12 DIAGNOSIS — C184 Malignant neoplasm of transverse colon: Secondary | ICD-10-CM | POA: Diagnosis not present

## 2022-02-12 DIAGNOSIS — C7801 Secondary malignant neoplasm of right lung: Secondary | ICD-10-CM | POA: Diagnosis not present

## 2022-02-12 MED ORDER — ONDANSETRON HCL 8 MG PO TABS: ORAL_TABLET | ORAL | 2 refills | Status: DC

## 2022-02-12 MED ORDER — PROCHLORPERAZINE MALEATE 10 MG PO TABS: 10.0000 mg | ORAL_TABLET | Freq: Four times a day (QID) | ORAL | 2 refills | Status: DC | PRN

## 2022-02-12 MED ORDER — LIDOCAINE-PRILOCAINE 2.5-2.5 % EX CREA: 1.0000 | TOPICAL_CREAM | CUTANEOUS | 2 refills | Status: DC | PRN

## 2022-02-12 NOTE — Progress Notes (Signed)
I met with Thomas Knight and his family during his follow up appointment with Cassie Heilingoetter,PA-C and  Dr Burr Medico.  I explained my role as a nurse navigator and provided my contact information.  I explained the services provided at [facility] and provided written information.  I explained the alight grant and let  him know one of the financial advisors will reach out to  him at the time of his chemo education class. I briefly explained insertion and care of a port a cath.I told him we will reach out to Dr white to see if he is able to place a prot in the next week or so if not we will schedule port placement with IR. I showed a sample of the port a cath. I reviewed [place] of lidocaine cream and advised him not to use the cream until the dermabond has come off his skin.  I briefly reviewed common side effects associated with the recommended chemotherapy regimen,  CAPEOX vs FOLFOX. He will call me with his treatment decision. I told him that he will be scheduled for chemotherapy education class prior to receiving chemotherapy.  I told him our schedulers will call him with those appts.  All questions were answered. They verbalized understanding.

## 2022-02-12 NOTE — Patient Instructions (Addendum)
-I know we covered a lot of important information at your appointment today.  Here is a summary of the main take away points. -When Dr. Valeta Harms performed the procedure and took the sample of the spot in the right lung, a pathologist looked at it under a microscope and ran some special test on the sample.  When they looked at it under a microscope.  It was positive for cancer.  The stains tell us that the cancer is the same cancer that was found and removed from your colon last year.  Therefore, this is considered stage IV colorectal cancer since cancer cells moved from your colon to a different part of the body (lung).  Once again, this does not make this lung cancer. -The PET scan did not show any areas that lit up on the scan.  This potentially could be explained if the cancer is not fast-growing (less aggressive) and because the spots in the lung are still relatively small. -Your initial CT scan showed some abnormal looking lymph nodes in the abdomen. Those also did not light up on the PET scan.  Once again, it is unclear if those nodes have cancer in them as well.  Either way the treatment goes to your entire body including the lymph nodes if those are related to cancer.  We will monitor these on follow-up CT scans.  Dr Burr Medico does not think it is necessary to biopsy these as it does not change the treatment plan or the stage. -Treatment will need to be chemotherapy as it goes to your entire body.  The treatment regimens are typically called "FOLFOX" or "CAPOX".  Both options are equally as effective.  The response rate is about 70 to 80%.  -Given your age, FOLFOX is more tolerable to most patients.  Dr. Burr Medico would recommend this treatment for you. -The FOLFOX treatment consists of 2 chemotherapy drugs, called fluorouracil, and oxaliplatin and one vitamin called leucovorin. (See attached AVS) -This treatment is given every 2 weeks.  All of these medicines are administered in an IV.  You will come in on the  first day of treatment.  Expect to be here approximately 6 hours total.  You would then go home with a small pump which will continue to give the medication through the port.  You would then come back 2 days later to have the pump detached (quick appointment).  This every 2 week regimen is a little more tolerable because the dose of the oxaliplatin is lower which means less side effects.  Most common side effects are fatigue, alterations, sensitivity to cold for a few days after treatment, nausea, vomiting, drops in the blood count. -The CAPOX regimen is 2 medications. One medication is called Capecitabine/Xeloda (pill medication) and the other medication is oxaliplatin (in both regimens).  The pill would be taken 3 tablets in the morning and 3 tablets at night (6 tablets total a day) for 14 days. You then will have 7 days off of not taking the pill. Effectively, this is 2 weeks of the pill/1 week off, then repeat this every 3 weeks.  The IV medication is given once every 3 weeks.  The dose is higher in this regimen which is why there are more side effects.  Dr. Burr Medico thinks that because of your age, she would prefer the FOLFOX regimen for quality of life and safety concerns.  On days of treatment, you would be here for approximately 4 hours or so. -Once you make a decision  about the treatment, please call Santiago Glad and she will help arrange future appointments. Before your start your treatment, I would like you to attend a Chemotherapy Education Class. This involves having you sit down with one of our nurse educators. She will discuss with your one-on-one more details about your treatment as well as general information about resources here at the cancer center.  -I will reach out to Dr. Dema Severin to see if he can put in a Port-A-Cath.  -Dr. Burr Medico would estimate that treatment would be approximately 3 months before obtaining a repeat CT scan.  Treatment could be as much is 3-6 months.  Dr. Burr Medico may consider radiation  based on your repeat scan vs just some sort of "maintenance" treatment.  More to come on that in the future. -Additionally, we are running some special test in the background to see what other treatments you may be a candidate for in the future such as targeted treatment or immunotherapy.  If you are not a candidate for any of these additional treatments, Dr. Burr Medico may add another biologic medication to your treatment called Avastin.  Avastin is a medication that basically affects the blood supply feeding the tumor.  She may consider adding this to your treatment starting from your second or third round of treatment. -Overall, in general, people with your condition is treatable and generally not curable, only in a small portion of patient are cured.   Medications:  -I have sent a few important medication prescriptions to your pharmacy.  -Compazine was sent to your pharmacy. This medication is for nausea. You may take this every 6 hours as needed if you feel nauseous.  -I have also sent a prescription zofran to take every 8 hours as needed for nausea starting 3 days after chemotherapy.  -I also sent a prescription for numbing cream to the pharmacy to apply to numb the port-a-cath site.  Essentially you will put a quarter size glob on top of the port site.  You do not need to rub the numbing cream into your skin.  Just put a glob on top of the port and cover it with Saran wrap about 30 minutes before your appointment.  By the time you get here, it will be numb.  You cannot use the numbing cream until the port site has healed completely.  For your first infusion, you may consider using ice to numb the area.   Referrals or Imaging:     Follow up:  -We will see you back for a follow up visit for your first infusion, please let us know what your final decision is and we will help facilitate your appointments.   -If you need to reach [place] at any time, the main office number to the cancer center is  [phone].

## 2022-02-12 NOTE — Progress Notes (Unsigned)
[place] OFFICE PROGRESS NOTE  Angelina Sheriff, MD [place] [address]  DIAGNOSIS: Metastatic colorectal cancer   Oncology History  Colon cancer [facility])  10/2020 Procedure   Colonoscopy-Dr. Benson Norway     11/06/2020 Imaging   CT CHEST ABDOMEN PELVIS W CONTRAST   IMPRESSION: 1. 4.6 cm partially circumferential apple-core type neoplastic process involving the mid transverse colon. No findings for locoregional adenopathy Knight distant metastatic disease. 2. Enlarged prostate gland with median lobe hypertrophy impressing on the base of the bladder. 3. 16 mm left thyroid nodule. Recommend follow-up thyroid ultrasound examination.   01/09/2021 Initial Diagnosis   Colon cancer ([place])   01/09/2021 Pathology Results   SURGICAL PATHOLOGY  **** THIS IS AN ADDENDUM REPORT ****  CASE: [id number]  PATIENT: Thomas Knight  Surgical Pathology Report   B. COLON, RIGHT, RESECTION:  -  Adenocarcinoma, moderately differentiated, 3.0 cm  -  No carcinoma identified in seventeen lymph nodes (0/17)  -  Margins uninvolved by carcinoma  -  Tubular adenoma (x2)  -  Benign appendix  -  See oncology table and comment below   Margin Status for Invasive Carcinoma: All margins negative for  invasive carcinoma       Distance from Invasive Carcinoma to Radial (Circumferential)       Margin Status for Non-Invasive Tumor: All margins negative for  high-grade dysplasia / intramucosal carcinoma and low-grade dysplasia  Regional Lymph Nodes:       Number of Lymph Nodes with Tumor: 0       Number of Lymph Nodes Examined: 17  Tumor Deposits: 0  Distant Metastasis:       Distant Site(s) Involved: Not applicable  Pathologic Stage Classification (pTNM, AJCC 8th Edition): pT3, pN0   MMR Stable    01/09/2021 Cancer Staging   Staging form: Colon and Rectum, AJCC 8th Edition - Pathologic stage from 01/09/2021: Stage IIA (pT3, pN0, cM0) - Signed by Truitt Merle, MD on  02/12/2022 Total positive nodes: 0 Histologic grading system: 4 grade system Histologic grade (G): G2 Residual tumor (R): R0 - None   05/2021 Tumor Marker   Patient's tumor was tested for the following markers: CEA. Results of the tumor marker test revealed 2.1.   12/11/2021 Procedure   Colonoscopy-Dr. Benson Norway  There was evidence of a prior functional end-to-end ileo-colonic anastomosis in the transverse colon. This was patent and was characterized by healthy appearing mucosa. The anastomosis was traversed. Findings: Scattered small and large-mouthed diverticula were found in the sigmoid colon. - Patent functional end-to-end ileo-colonic anastomosis, characterized by healthy appearing mucosa. - Diverticulosis in the sigmoid colon. - No specimens collected.   01/25/2022 Imaging   CT CHEST ABDOMEN PELVIS W CONTRAST   IMPRESSION: 1. New right-sided pulmonary nodules measure up to 11 mm, nonspecific but concerning for metastatic disease. Consider further evaluation with nuclear medicine PET/CT. 2. New nodularity/lymph nodes in the central mesentery, concerning for nodal disease involvement. 3. Slightly increased size of prominent retroperitoneal lymph nodes, nonspecific but also somewhat concerning for nodal disease involvement. 4. Surgical change of partial colectomy without evidence of local recurrence. 5. Stable prominent mediastinal lymph nodes, nonspecific but stability is reassuring. Attention on follow-up imaging suggested. 6. Stable prominent right external iliac lymph node, nonspecific but stability is reassuring. Attention on follow-up imaging suggested. 7. Stable hypodense 8 mm hepatic lesion technically too small to accurately characterize but stability is reassuring. Attention on follow-up imaging suggested. 8. 1.9 cm incidental left thyroid nodule.  Recommend thyroid US. Reference: J Am Coll Radiol. 2015 Feb;12(2): 143-50 9.  Aortic Atherosclerosis (ICD10-I70.0).    02/09/2022 Procedure   Bronchoscopy under the care of Dr. Valeta Harms    02/09/2022 Pathology Results   CYTOLOGY - NON PAP  CASE: [id number]  PATIENT: Thomas Knight  Non-Gynecological Cytology Report   CYTOLOGY - NON PAP  CASE: [id number]  PATIENT: Thomas Knight  Non-Gynecological Cytology Report      Clinical History: Lung Nodules     FINAL MICROSCOPIC DIAGNOSIS:   A. LUNG, RUL, FINE NEEDLE ASPIRATION  BIOPSY FORCEPS:  Adenocarcinoma with morphologic features consistent with metastasis from  a colorectal primary.  Please see comment.   Comment: The following immunostains are performed with appropriate  controls :  TTF-1: Negative .  Napsin A: Negative .  CK7: Negative.  CK20: Positive.  CDX2: Positive .   The above immuno histochemical profile is supportive of the rendered  diagnosis.  This case was also reviewed by Dr. Saralyn Pilar who concurs with the above  interpretation.      CURRENT THERAPY: Starting chemotherapy in the next couple of weeks FOLFOX vs. CAPOX  INTERVAL HISTORY: Thomas Knight 76 y.o. male returns to the clinic today for a follow up visit. The patient was first seen in the clinic on 01/29/22.  The patient has a history of stage II colorectal cancer that was resected in July 2022.  The patient was on observation and feeling well. He recently had a surveillance CT of the chest, abdomen, pelvis performed in July 2023 which showed new right-sided pulmonary nodules measuring up to 11 mm.  This is suspicious for metastatic disease. There were also new nodularity/lymph nodes in the central mesentery, concerning for nodal disease involvement and slightly increased nonspecific retroperitoneal lymph nodes. He subsequently had a PET scan which did not show any hypermetabolism, and this remained inconclusive. Therefore, the patient saw Dr. Valeta Harms from pulmonary medicine who performed bronchoscopy and biopsy of 1 of these right upper lobe lesions and the pathology was  consistent metastatic adenocarcinoma of the colon. This was performed on 02/09/22.   Since last being seen, the patient denies any new concerning complaints with his symptoms.  Mentally, he is having some trouble with the recent diagnosis. He continues to be asymptomatic from a physical standpoint. The patient has never had any symptoms related to his initial diagnosis Knight symptoms related to his possible recurrence.  The patient is active walking 30 minutes daily.  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  The patient denies any recent fatigue, sleep disturbances, appetite change, fevers, chills, night sweats, lymphadenopathy, Knight unexplained weight loss. Denies any nausea, vomiting, abdominal pain, constipation, melena, Knight hematochezia. Denies any cough, shortness of breath, chest pain, Knight hemoptysis.  Patient is a never smoker.  He is here today for evaluation and a more detailed discussion about his current condition and recommended treatment options.   MEDICAL HISTORY: Past Medical History:  Diagnosis Date   Bilateral leg edema 08/24/2019   Cancer ([place])    skin ca, colon   Chronic cough    Chronic pain of left knee 12/01/2015   COVID-19    06/2019   Dysrhythmia    afib  was cardioverted   Essential hypertension 08/24/2019   Mixed hyperlipidemia 08/24/2019   Obesity (BMI 30-39.9) 08/24/2019   Persistent atrial fibrillation ([place]) 08/24/2019    ALLERGIES:  has No Known Allergies.  MEDICATIONS:  Current Outpatient Medications  Medication Sig Dispense Refill   acetaminophen (TYLENOL) 500 MG tablet Take 500-1,000 mg by mouth every 6 (six) hours as needed (pain.).     Ascorbic Acid (VITAMIN C WITH ROSE HIPS) 500 MG tablet Take 500 mg by mouth every evening.     Cholecalciferol (D3-1000) 25 MCG (1000 UT) capsule Take 1,000 Units by mouth every evening.     ELIQUIS 5 MG TABS tablet Take 1 tablet (5 mg  total) by mouth 2 (two) times daily. 180 tablet 0   lidocaine-prilocaine (EMLA) cream Apply 1 Application topically as needed. 30 g 2   metoprolol tartrate (LOPRESSOR) 25 MG tablet Take 1 tablet (25 mg total) by mouth 2 (two) times daily. 180 tablet 3   Misc Natural Products (JOINT SUPPORT PO) Take 1 tablet by mouth daily. Instaflex Advanced Joint Support     ondansetron (ZOFRAN) 8 MG tablet Take 1 tablet every 8 hours as needed for nausea/vomiting starting on 3 days after chemotherapy 30 tablet 2   prochlorperazine (COMPAZINE) 10 MG tablet Take 1 tablet (10 mg total) by mouth every 6 (six) hours as needed. 30 tablet 2   vitamin B-12 (CYANOCOBALAMIN) 1000 MCG tablet Take 1,000 mcg by mouth every evening.     Zinc 50 MG CAPS Take 50 mg by mouth every evening.     nitroGLYCERIN (NITROSTAT) 0.4 MG SL tablet Place 1 tablet (0.4 mg total) under the tongue every 5 (five) minutes as needed. (Patient not taking: Reported on 02/12/2022) 25 tablet 7   No current facility-administered medications for this visit.    SURGICAL HISTORY:  Past Surgical History:  Procedure Laterality Date   ACHILLES TENDON REPAIR Left 2012   Ruptured   BRONCHIAL BIOPSY  02/09/2022   Procedure: BRONCHIAL BIOPSIES;  Surgeon: Garner Nash, DO;  Location: [facility];  Service: Pulmonary;;   BRONCHIAL NEEDLE ASPIRATION BIOPSY  02/09/2022   Procedure: BRONCHIAL NEEDLE ASPIRATION BIOPSIES;  Surgeon: Garner Nash, DO;  Location: [facility];  Service: Pulmonary;;   COLONOSCOPY WITH PROPOFOL N/A 12/11/2021   Procedure: COLONOSCOPY WITH PROPOFOL;  Surgeon: Carol Ada, MD;  Location: [facility];  Service: Gastroenterology;  Laterality: N/A;   HERNIA REPAIR     umbilical hernia   SKIN SURGERY Left    Basal cell carcinoma on left forearm   XI ROBOTIC ASSISTED LOWER ANTERIOR RESECTION N/A 01/09/2021   Procedure: XI ROBOTIC ASSISTED RIGHT HEMI COLECTOMY, LYSIS OF ADHESIONS, SMALL BOWEL RESECTION, INTRAOPERATIVE ASSESMENT OF  PERFUSION, PARTIAL OMENTECTOMY;  Surgeon: Ileana Roup, MD;  Location: [facility];  Service: General;  Laterality: N/A;    REVIEW OF SYSTEMS:   Review of Systems  Constitutional: Negative for appetite change, chills, fatigue, fever and unexpected weight change.  HENT:   Negative for mouth sores, nosebleeds, sore throat and trouble swallowing.   Eyes: Negative for eye problems and icterus.  Respiratory: Negative for cough, hemoptysis, shortness of breath and wheezing.   Cardiovascular: Negative for chest pain and leg swelling.  Gastrointestinal: Negative for abdominal pain, constipation, diarrhea, nausea and vomiting.  Genitourinary: Negative for bladder incontinence, difficulty urinating, dysuria, frequency and hematuria.   Musculoskeletal: Negative for back pain, gait problem, neck pain and neck stiffness.  Skin: Negative for itching and rash.  Neurological: Negative for dizziness, extremity weakness, gait problem, headaches, light-headedness and seizures.  Hematological: Negative for adenopathy. Does not bruise/bleed easily.  Psychiatric/Behavioral: Negative for confusion, depression and sleep disturbance. The patient is not nervous/anxious.  PHYSICAL EXAMINATION:  Blood pressure (!) 172/74, pulse (!) 55, temperature 98.1 F (36.7 C), resp. rate 20, weight 249 lb 9.6 oz (113.2 kg), SpO2 98 %.  ECOG PERFORMANCE STATUS: 0  Physical Exam  Constitutional: Oriented to person, place, and time and well-developed, well-nourished, and in no distress.  HENT:  Head: Normocephalic and atraumatic.  Mouth/Throat: Oropharynx is clear and moist. No oropharyngeal exudate.  Eyes: Conjunctivae are normal. Right eye exhibits no discharge. Left eye exhibits no discharge. No scleral icterus.  Neck: Normal range of motion. Neck supple.  Cardiovascular: Normal rate, regular rhythm, normal heart sounds and intact distal pulses.   Pulmonary/Chest: Effort normal and breath sounds normal. No  respiratory distress. No wheezes. No rales.  Abdominal: Soft. Bowel sounds are normal. Exhibits no distension and no mass. There is no tenderness.  Musculoskeletal: Normal range of motion. Exhibits no edema.  Lymphadenopathy:    No cervical adenopathy.  Neurological: Alert and oriented to person, place, and time. Exhibits normal muscle tone. Gait normal. Coordination normal.  Skin: Skin is warm and dry. No rash noted. Not diaphoretic. No erythema. No pallor.  Psychiatric: Mood, memory and judgment normal.  Vitals reviewed.  LABORATORY DATA: Lab Results  Component Value Date   WBC 6.2 02/09/2022   HGB 17.1 (H) 02/09/2022   HCT 52.2 (H) 02/09/2022   MCV 95.3 02/09/2022   PLT 193 02/09/2022      Chemistry      Component Value Date/Time   NA 138 02/09/2022 0731   NA 142 02/20/2020 0912   K 4.3 02/09/2022 0731   CL 108 02/09/2022 0731   CO2 19 (L) 02/09/2022 0731   BUN 12 02/09/2022 0731   BUN 9 02/20/2020 0912   CREATININE 0.92 02/09/2022 0731      Component Value Date/Time   CALCIUM 8.6 (L) 02/09/2022 0731   ALKPHOS 60 12/23/2020 1103   AST 23 12/23/2020 1103   ALT 23 12/23/2020 1103   BILITOT 0.6 12/23/2020 1103       RADIOGRAPHIC STUDIES:  DG Chest Port 1 View  Result Date: 02/09/2022 CLINICAL DATA:  Post bronchoscopy EXAM: PORTABLE CHEST 1 VIEW COMPARISON:  07/08/2019 FINDINGS: The heart size and mediastinal contours are within normal limits. Both lungs are clear. Pulmonary nodules better seen on prior CT. No pleural effusion Knight pneumothorax. The visualized skeletal structures are unremarkable. IMPRESSION: No pneumothorax. Electronically Signed   By: Macy Mis M.D.   On: 02/09/2022 11:44   DG C-ARM BRONCHOSCOPY  Result Date: 02/09/2022 C-ARM BRONCHOSCOPY: Fluoroscopy was utilized by the requesting physician.  No radiographic interpretation.   DG C-Arm 1-60 Min-No Report  Result Date: 02/09/2022 Fluoroscopy was utilized by the requesting physician.  No  radiographic interpretation.   NM PET Image Initial (PI) Skull Base To Thigh  Result Date: 02/05/2022 CLINICAL DATA:  Subsequent treatment strategy for prior history of colon cancer. New pulmonary nodules. EXAM: NUCLEAR MEDICINE PET SKULL BASE TO THIGH TECHNIQUE: 12.4 mCi F-18 FDG was injected intravenously. Full-ring PET imaging was performed from the skull base to thigh after the radiotracer. CT data was obtained and used for attenuation correction and anatomic localization. Fasting blood glucose: 82 mg/dl COMPARISON:  CT scan 01/25/2022 FINDINGS: Mediastinal blood pool activity: SUV max 2.07 Liver activity: SUV max NA NECK: No hypermetabolic lymph nodes in the neck. Incidental CT findings: None. CHEST: The 2 larger right-sided pulmonary nodules do not show any hypermetabolism. The 11 mm lateral nodule in the right upper lobe has an SUV max of  1.81 and a 10 mm right lower lobe nodule has an SUV max of 1.71. No enlarged Knight hypermetabolic mediastinal hilar lymph nodes. No supraclavicular Knight axillary adenopathy. Incidental CT findings: Stable mild atherosclerotic calcifications involving the aorta and coronary arteries. ABDOMEN/PELVIS: No findings to suggest hepatic metastatic disease. The adrenal glands, pancreas, spleen and both kidneys are unremarkable. Small scattered lymph nodes in the root of the small bowel mesentery do not demonstrate any hypermetabolism to suggest metastatic adenopathy. No retroperitoneal adenopathy. No pelvic adenopathy. Incidental CT findings: Minimal vascular calcifications for age. Prostate gland is enlarged but no areas of hypermetabolism. Colonic diverticulosis appears stable. SKELETON: No focal hypermetabolic activity to suggest skeletal metastasis. Incidental CT findings: None. IMPRESSION: 1. The right lung pulmonary nodules do not show any hypermetabolism. These remain indeterminate. Possibilities would include biopsy Knight observation (follow-up noncontrast chest CT in 3 months  and if they enlarge, then proceeded to biopsy). 2. No findings for abdominal/pelvic metastatic disease. The mesenteric lymph nodes are not hypermetabolic and likely reactive. These could also be followed. Electronically Signed   By: Marijo Sanes M.D.   On: 02/05/2022 14:15   CT CHEST ABDOMEN PELVIS W CONTRAST  Result Date: 01/25/2022 CLINICAL DATA:  Colon cancer staging.  * Tracking Code: BO * EXAM: CT CHEST, ABDOMEN, AND PELVIS WITH CONTRAST TECHNIQUE: Multidetector CT imaging of the chest, abdomen and pelvis was performed following the standard protocol during bolus administration of intravenous contrast. RADIATION DOSE REDUCTION: This exam was performed according to the departmental dose-optimization program which includes automated exposure control, adjustment of the mA and/Knight kV according to patient size and/Knight use of iterative reconstruction technique. CONTRAST:  170m ISOVUE-300 IOPAMIDOL (ISOVUE-300) INJECTION 61% COMPARISON:  CT Nov 06, 2020 FINDINGS: CT CHEST FINDINGS Cardiovascular: Aortic atherosclerosis. No central pulmonary embolus on this nondedicated study. Normal size heart. No significant pericardial effusion/thickening. Mediastinum/Nodes: No supraclavicular adenopathy. Hypodense 19 mm nodule in the left lobe of the thyroid. Prominent mediastinal lymph nodes not pathologically enlarged by size criteria are similar prior. No pathologically enlarged mediastinal, hilar Knight axillary lymph nodes. The esophagus is grossly unremarkable. Lungs/Pleura: New pulmonary nodule in the peripheral right upper lobe measuring 11 mm on image 59/3. New posterior peripheral 10 mm right upper lobe pulmonary nodule on image 49/3. New 3 mm central right upper lobe pulmonary nodule on image 55/3. Musculoskeletal: No aggressive lytic Knight blastic lesion of bone. No suspicious chest wall mass. Multilevel degenerative changes spine with bridging anterior vertebral osteophytes. CT ABDOMEN PELVIS FINDINGS Hepatobiliary:  Hypodense 8 mm lesion in the right lobe of the liver is unchanged from prior. No solid enhancing hepatic lesion. Gallbladder is unremarkable. No biliary ductal dilation. Pancreas: No pancreatic ductal dilation Knight evidence of acute inflammation. Spleen: No splenomegaly Knight focal splenic lesion. Adrenals/Urinary Tract: Bilateral adrenal glands appear normal. No hydronephrosis. Exophytic 2.2 cm left renal lesion measures fluid density consistent with a cyst and considered benign requiring no imaging follow-up. Urinary bladder is unremarkable for degree of distension. Stomach/Bowel: Radiopaque enteric contrast material traverses the rectum. Stomach is minimally distended limiting evaluation. No pathologic dilation of small Knight large bowel. No evidence of acute bowel inflammation. Surgical change of partial right hemicolectomy without suspicious enhancing soft tissue nodularity along the suture line. Vascular/Lymphatic: Aortic atherosclerosis without abdominal aortic aneurysm. Prominent retroperitoneal lymph nodes are slightly increased from prior for instance a precaval lymph node on image 80/2 measuring 7 mm previously measured 4 mm. New nodularity/lymph nodes in the central mesentery for instance on images 65-60 8/2 measuring  up to 10 mm on image 65/2. Stable prominent right external iliac lymph node measuring 9 mm on image 99/2. Reproductive: Prostatomegaly. Other: No significant abdominopelvic free fluid.  No omental caking. Musculoskeletal: No aggressive lytic Knight blastic lesion of bone. Multilevel degenerative changes spine. Degenerative changes bilateral hips. IMPRESSION: 1. New right-sided pulmonary nodules measure up to 11 mm, nonspecific but concerning for metastatic disease. Consider further evaluation with nuclear medicine PET/CT. 2. New nodularity/lymph nodes in the central mesentery, concerning for nodal disease involvement. 3. Slightly increased size of prominent retroperitoneal lymph nodes, nonspecific but  also somewhat concerning for nodal disease involvement. 4. Surgical change of partial colectomy without evidence of local recurrence. 5. Stable prominent mediastinal lymph nodes, nonspecific but stability is reassuring. Attention on follow-up imaging suggested. 6. Stable prominent right external iliac lymph node, nonspecific but stability is reassuring. Attention on follow-up imaging suggested. 7. Stable hypodense 8 mm hepatic lesion technically too small to accurately characterize but stability is reassuring. Attention on follow-up imaging suggested. 8. 1.9 cm incidental left thyroid nodule. Recommend thyroid US. Reference: J Am Coll Radiol. 2015 Feb;12(2): 143-50 9.  Aortic Atherosclerosis (ICD10-I70.0). These results will be called to the ordering clinician Knight representative by the Radiologist Assistant, and communication documented in the PACS Knight [employer]. Electronically Signed   By: Dahlia Bailiff M.D.   On: 01/25/2022 16:15     ASSESSMENT/PLAN:  This is a very pleasant 76 year old male with:   Colorectal cancer, initially diagnosed in July 2022 as stage II (pT3, N0), metastatic disease to the lung found in July 2023. -Moderately differentiated adenocarcinoma of the transverse colon was found on screening colonoscopy May 2022. -Underwent right hemicolectomy on 01/09/2021 which showed 3 cm moderately differentiated adenocarcinoma of the transverse colon.  Stage is a pT3, PN0 (0/17).  MMR preserved.  -More recently, on 01/25/2022 the patient had a surveillance CT of the chest, abdomen, and pelvis which showed new right-sided pulmonary nodules measuring up to 11 mm, new nodularity/lymph nodes in the central mesentery, slightly increased nonspecific prominent retroperitoneal lymph nodes, and stable prominent mediastinal lymph nodes which are also noted to be nonspecific as well as a stable prominent nonspecific right external iliac lymph node and a stable hypodense 8 mm hepatic lesion which is too  small to accurately characterize. -PET on 02/04/22 did not show any hypermetabolism in the right lung pulmonary nodules Knight any findings in the abdomen/pelvis for metastatic disease.  The mesenteric lymph nodes are not hypermetabolic -CEA on 0/9/81 WNL at 1.97 -Dr. Valeta Harms from pulmonology, arrange for navigational bronchoscopy on 02/09/22. The final pathology ([id number]) of the RUL FNA was Adenocarcinoma with morphologic features consistent with metastasis from a colorectal primary. -Dr. Burr Medico discussed that this is IV disease, which is generally treatable but not curable in majority of cases.  Dr. Burr Medico discussed that while the suspicious lymph nodes did not show any hypermetabolic activity, that she cannot exclude nodal metastases. -We will arrange for the patient's tissue to be sent for foundation one testing to see if he he a candidate for targeted treatment Knight immunotherapy -Dr. Burr Medico recommended systemic chemotherapy which is given every 2 to 3 weeks depending on the regimen. Dr. Burr Medico would recommend FOLFOX as it is likely more tolerable and safer in his age.  -Dr. Burr Medico reviewed the logistics of CAPOX versus FOLFOX.  Dr. Burr Medico would recommend chemotherapy for approximately 3 to 6 months.  She will arrange for repeat CT scan after 3 months of treatment.           --  Chemotherapy consent: Side effects including but does not limited to, fatigue, nausea, vomiting, diarrhea, hair loss, neuropathy, fluid retention, renal and kidney dysfunction, neutropenic fever, needed for blood transfusion, bleeding, were discussed with patient in great detail. She agrees to proceed.  -The patient is interested in starting treatment with chemotherapy but he would  like to think about his options of CAPOX and FOLFOX and will call us once he has made the final decision. -Dr. Burr Medico may consider adding Avastin starting from cycle #2 Knight 3 based on the foundation 1 testing results. - We will reach out to Dr. Dema Severin to see if he can  arrange for a port-a-cath placement -I will send prescriptions for Compazine and Zofran to the patient's pharmacy.  I will send a prescription for Emla cream to the patient's pharmacy -We will arrange for chemo education class prior to receiving his first cycle of treatment. -We will see the patient back for follow-up visit before starting cycle #1 of treatment  2. Hypertension -The patient reports he is nervous and his blood pressure is typically high at his clinic appointments.  He is asymptomatic -The patient has a blood pressure cuff at home and checks his blood pressure every night.  He states that his blood pressure is normal at home using 2 different blood pressure cuffs.    3.  Social -The patient lives in a house with his wife Jocelyn Lamer.  The patient's daughter and son live nearby. 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 -The patient is active and walks 30 minutes every day.  He is also on the football field approximately 2-1/2 hours on days of practice -The patient drives from [place]: -Arrange for chemo education class once decision is made -Foundation one testing requested -Prescription sent for Compazine, Zofran, and Emla cream. -Reach out to Dr. Dema Severin about possible Port-A-Cath insertion -Follow-up with cycle #1 once the patient has made a decision  No orders of the defined types were placed in this encounter.     La Shehan L Urho Rio, PA-C 02/12/22

## 2022-02-15 ENCOUNTER — Encounter: Payer: Self-pay | Admitting: Physician Assistant

## 2022-02-15 ENCOUNTER — Other Ambulatory Visit: Payer: Self-pay | Admitting: Physician Assistant

## 2022-02-15 DIAGNOSIS — C189 Malignant neoplasm of colon, unspecified: Secondary | ICD-10-CM

## 2022-02-16 ENCOUNTER — Encounter: Payer: Self-pay | Admitting: Physician Assistant

## 2022-02-16 ENCOUNTER — Encounter: Payer: Self-pay | Admitting: Acute Care

## 2022-02-16 ENCOUNTER — Ambulatory Visit (INDEPENDENT_AMBULATORY_CARE_PROVIDER_SITE_OTHER): Payer: Medicare HMO | Admitting: Acute Care

## 2022-02-16 ENCOUNTER — Other Ambulatory Visit: Payer: Self-pay | Admitting: Hematology

## 2022-02-16 VITALS — BP 144/78 | HR 98 | Temp 98.1°F | Ht 71.0 in | Wt 248.2 lb

## 2022-02-16 DIAGNOSIS — R918 Other nonspecific abnormal finding of lung field: Secondary | ICD-10-CM

## 2022-02-16 DIAGNOSIS — C801 Malignant (primary) neoplasm, unspecified: Secondary | ICD-10-CM

## 2022-02-16 DIAGNOSIS — C189 Malignant neoplasm of colon, unspecified: Secondary | ICD-10-CM

## 2022-02-16 MED ORDER — ONDANSETRON HCL 8 MG PO TABS: 8.0000 mg | ORAL_TABLET | Freq: Three times a day (TID) | ORAL | 1 refills | Status: DC | PRN

## 2022-02-16 MED ORDER — LIDOCAINE-PRILOCAINE 2.5-2.5 % EX CREA: TOPICAL_CREAM | CUTANEOUS | 3 refills | Status: DC

## 2022-02-16 MED ORDER — PROCHLORPERAZINE MALEATE 10 MG PO TABS: 10.0000 mg | ORAL_TABLET | Freq: Four times a day (QID) | ORAL | 1 refills | Status: DC | PRN

## 2022-02-16 NOTE — Patient Instructions (Addendum)
It is good to see you today.  You have recovered well from your biopsy procedure.  Good luck with chemo and port placement this week.  The [facility] is going to take great care of you.  Let us know if you need [place] for anything. Follow up with cardiology or PCP about BP elevation.  Please contact office for sooner follow up if symptoms do not improve or worsen or seek emergency care

## 2022-02-16 NOTE — Progress Notes (Signed)
Thomas Knight called stating that he has decided on FOLFOX for treatment.  I have scheduled his chemo ed for next Monday 8/28.  He would like to start chemo next week as well.  Dr Burr Medico notified. I let him know that our scheduling department will reach out to him with his chemotherapy appts.  All questions were answered.  He verbalized understanding.

## 2022-02-16 NOTE — Progress Notes (Signed)
START ON PATHWAY REGIMEN - Colorectal     A cycle is every 14 days:     Bevacizumab-xxxx      Oxaliplatin      Leucovorin      Fluorouracil      Fluorouracil   **Always confirm dose/schedule in your pharmacy ordering system**  Patient Characteristics: Distant Metastases, Nonsurgical Candidate, KRAS/NRAS Mutation Positive/Unknown (BRAF V600 Wild-Type/Unknown), Standard Cytotoxic Therapy, First Line Standard Cytotoxic Therapy, Bevacizumab Eligible, PS = 0,1 Tumor Location: Colon Therapeutic Status: Distant Metastases Microsatellite/Mismatch Repair Status: Unknown BRAF Mutation Status: Awaiting Test Results KRAS/NRAS Mutation Status: Awaiting Test Results Standard Cytotoxic Line of Therapy: First Line Standard Cytotoxic Therapy ECOG Performance Status: 0 Bevacizumab Eligibility: Eligible Intent of Therapy: Non-Curative / Palliative Intent, Discussed with Patient

## 2022-02-16 NOTE — Progress Notes (Unsigned)
History of Present Illness Thomas Knight is a 76 y.o. male never smoker with  past medical history of hypertension, hyperlipidemia, BMI 30, persistent atrial fibrillation currently on Eliquis. 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 He was stages as stage classification of a pT3 pN0 M0. He has been in observation  He was referred to Dr. Valeta Harms for finding of new pulmonary nodules in surveillance Ct 12/2021.    Synopsis Patient had a recent diagnosis of colon cancer and was taken for a right hemicolectomy.Surgical pathology on 01/09/2021 revealed a 3 cm adenocarcinoma moderately differentiated margins were uninvolved.  There were 17 lymph nodes.  These were negative.  Patient was pathologic stage classification of a pT3 pN0 M0.  He was in observation phase. Patient had a surveillance CT chest abdomen and pelvis completed 01/25/2022..  This was done with contrast.  He was found to have a new right sided 11 mm pulmonary nodule.  Additionally there was some new nodular lymph nodes in the central mesentery.  There was slightly increased size of the retroperitoneal lymph nodes. Patient's case was reviewed  in medical thoracic oncology multidisciplinary tumor conference.  Lesions within the chest were deemed necessary for consideration of tissue biopsy due to the patient's history of colorectal cancer.  Patient was brought urgently to clinic to discuss next steps regarding tissue biopsy.        02/16/2022 Pt. Presents for follow up. He has had a roller coaster ride since he had the finding of pulmonary nodules on his most recent surveillance Ct 01/25/2022, which found a new right sided 11 mm pulmonary nodule.  Additionally there was some new nodular lymph nodes in the central mesentery.  There was slightly increased size of the retroperitoneal lymph nodes. Lesions within the chest were deemed necessary for consideration of tissue biopsy due to the  patient's history of colorectal cancer. He also had a PET scan on 02/04/2022 which revealed the 2 larger right sided nodules has SUV max of 1.81 and 1.71 respectively. The 2 larger right-sided pulmonary nodules do not show any hypermetabolism. The 11 mm lateral nodule in the right upper lobe has an SUV max of 1.81 and a 10 mm right lower lobe nodule has an SUV max of 1.71.No enlarged or hypermetabolic mediastinal hilar lymph nodes. No supraclavicular or axillary adenopathy. There was recommendation for biopsy which was done      6 month follow up surveillance Ct Chest >> 2 nodules R upper lung  PET scan was done / CEA blood work was done CEA was 2.1 in 12/2020 CEA now 1.97 PET showed nothing concerning Dr. Burr Medico said the only sure way of knowing this was not cancer was a biopsy. + malignant  8/24 Port placement  Education Starting treatment next  Cytology Adenocarcinoma with morphologic features consistent with metastasis from  a colorectal primary.  FolFox chemo every 2 weeks.  Minimal blood loss during the procedure.  Minimal sore throat x 1 day, hoarse x 24 hours Some muscle soreness x 24 hours , did not even  No breathing issues. Covid 06/2019, in hospital x 2 days  Test Results: PET CT Base of Skull to thigh The right lung pulmonary nodules do not show any hypermetabolism. These remain indeterminate. Possibilities would include biopsy or observation (follow-up noncontrast chest CT in 3 months and if they enlarge, then proceeded to biopsy). 2. No findings for abdominal/pelvic metastatic disease. The mesenteric lymph nodes are  not hypermetabolic and likely reactive. These could also be followed.    CT Chest 01/25/2022 New right-sided pulmonary nodules measure up to 11 mm, nonspecific but concerning for metastatic disease. Consider further evaluation with nuclear medicine PET/CT. 2. New nodularity/lymph nodes in the central mesentery, concerning for nodal disease involvement. 3.  Slightly increased size of prominent retroperitoneal lymph nodes, nonspecific but also somewhat concerning for nodal disease involvement. 4. Surgical change of partial colectomy without evidence of local recurrence. 5. Stable prominent mediastinal lymph nodes, nonspecific but stability is reassuring. Attention on follow-up imaging suggested. 6. Stable prominent right external iliac lymph node, nonspecific but stability is reassuring. Attention on follow-up imaging suggested. 7. Stable hypodense 8 mm hepatic lesion technically too small to accurately characterize but stability is reassuring. Attention on follow-up imaging suggested. 8. 1.9 cm incidental left thyroid nodule. Recommend thyroid US. Reference: J Am Coll Radiol. 2015 Feb;12(2): 143-50 9.  Aortic Atherosclerosis (ICD10-I70.0).     Latest Ref Rng & Units 02/09/2022    7:31 AM 01/12/2021    4:08 AM 01/11/2021    4:19 AM  CBC  WBC 4.0 - 10.5 K/uL 6.2  11.0  13.3   Hemoglobin 13.0 - 17.0 g/dL 17.1  11.5  11.3   Hematocrit 39.0 - 52.0 % 52.2  36.3  35.7   Platelets 150 - 400 K/uL 193  242  208        Latest Ref Rng & Units 02/09/2022    7:31 AM 01/12/2021    4:08 AM 01/11/2021    4:19 AM  BMP  Glucose 70 - 99 mg/dL 106  135  116   BUN 8 - 23 mg/dL 12  13  13    Creatinine 0.61 - 1.24 mg/dL 0.92  0.89  0.90   Sodium 135 - 145 mmol/L 138  138  138   Potassium 3.5 - 5.1 mmol/L 4.3  4.1  4.1   Chloride 98 - 111 mmol/L 108  102  104   CO2 22 - 32 mmol/L 19  28  28    Calcium 8.9 - 10.3 mg/dL 8.6  8.7  8.4     BNP No results found for: "BNP"  ProBNP No results found for: "PROBNP"  PFT No results found for: "FEV1PRE", "FEV1POST", "FVCPRE", "FVCPOST", "TLC", "DLCOUNC", "PREFEV1FVCRT", "PSTFEV1FVCRT"  DG Chest Port 1 View  Result Date: 02/09/2022 CLINICAL DATA:  Post bronchoscopy EXAM: PORTABLE CHEST 1 VIEW COMPARISON:  07/08/2019 FINDINGS: The heart size and mediastinal contours are within normal limits. Both lungs are  clear. Pulmonary nodules better seen on prior CT. No pleural effusion or pneumothorax. The visualized skeletal structures are unremarkable. IMPRESSION: No pneumothorax. Electronically Signed   By: Macy Mis M.D.   On: 02/09/2022 11:44   DG C-ARM BRONCHOSCOPY  Result Date: 02/09/2022 C-ARM BRONCHOSCOPY: Fluoroscopy was utilized by the requesting physician.  No radiographic interpretation.   DG C-Arm 1-60 Min-No Report  Result Date: 02/09/2022 Fluoroscopy was utilized by the requesting physician.  No radiographic interpretation.   NM PET Image Initial (PI) Skull Base To Thigh  Result Date: 02/05/2022 CLINICAL DATA:  Subsequent treatment strategy for prior history of colon cancer. New pulmonary nodules. EXAM: NUCLEAR MEDICINE PET SKULL BASE TO THIGH TECHNIQUE: 12.4 mCi F-18 FDG was injected intravenously. Full-ring PET imaging was performed from the skull base to thigh after the radiotracer. CT data was obtained and used for attenuation correction and anatomic localization. Fasting blood glucose: 82 mg/dl COMPARISON:  CT scan 01/25/2022 FINDINGS: Mediastinal blood pool activity: SUV  max 2.07 Liver activity: SUV max NA NECK: No hypermetabolic lymph nodes in the neck. Incidental CT findings: None. CHEST: The 2 larger right-sided pulmonary nodules do not show any hypermetabolism. The 11 mm lateral nodule in the right upper lobe has an SUV max of 1.81 and a 10 mm right lower lobe nodule has an SUV max of 1.71. No enlarged or hypermetabolic mediastinal hilar lymph nodes. No supraclavicular or axillary adenopathy. Incidental CT findings: Stable mild atherosclerotic calcifications involving the aorta and coronary arteries. ABDOMEN/PELVIS: No findings to suggest hepatic metastatic disease. The adrenal glands, pancreas, spleen and both kidneys are unremarkable. Small scattered lymph nodes in the root of the small bowel mesentery do not demonstrate any hypermetabolism to suggest metastatic adenopathy. No  retroperitoneal adenopathy. No pelvic adenopathy. Incidental CT findings: Minimal vascular calcifications for age. Prostate gland is enlarged but no areas of hypermetabolism. Colonic diverticulosis appears stable. SKELETON: No focal hypermetabolic activity to suggest skeletal metastasis. Incidental CT findings: None. IMPRESSION: 1. The right lung pulmonary nodules do not show any hypermetabolism. These remain indeterminate. Possibilities would include biopsy or observation (follow-up noncontrast chest CT in 3 months and if they enlarge, then proceeded to biopsy). 2. No findings for abdominal/pelvic metastatic disease. The mesenteric lymph nodes are not hypermetabolic and likely reactive. These could also be followed. Electronically Signed   By: Marijo Sanes M.D.   On: 02/05/2022 14:15   CT CHEST ABDOMEN PELVIS W CONTRAST  Result Date: 01/25/2022 CLINICAL DATA:  Colon cancer staging.  * Tracking Code: BO * EXAM: CT CHEST, ABDOMEN, AND PELVIS WITH CONTRAST TECHNIQUE: Multidetector CT imaging of the chest, abdomen and pelvis was performed following the standard protocol during bolus administration of intravenous contrast. RADIATION DOSE REDUCTION: This exam was performed according to the departmental dose-optimization program which includes automated exposure control, adjustment of the mA and/or kV according to patient size and/or use of iterative reconstruction technique. CONTRAST:  147mL ISOVUE-300 IOPAMIDOL (ISOVUE-300) INJECTION 61% COMPARISON:  CT Nov 06, 2020 FINDINGS: CT CHEST FINDINGS Cardiovascular: Aortic atherosclerosis. No central pulmonary embolus on this nondedicated study. Normal size heart. No significant pericardial effusion/thickening. Mediastinum/Nodes: No supraclavicular adenopathy. Hypodense 19 mm nodule in the left lobe of the thyroid. Prominent mediastinal lymph nodes not pathologically enlarged by size criteria are similar prior. No pathologically enlarged mediastinal, hilar or axillary  lymph nodes. The esophagus is grossly unremarkable. Lungs/Pleura: New pulmonary nodule in the peripheral right upper lobe measuring 11 mm on image 59/3. New posterior peripheral 10 mm right upper lobe pulmonary nodule on image 49/3. New 3 mm central right upper lobe pulmonary nodule on image 55/3. Musculoskeletal: No aggressive lytic or blastic lesion of bone. No suspicious chest wall mass. Multilevel degenerative changes spine with bridging anterior vertebral osteophytes. CT ABDOMEN PELVIS FINDINGS Hepatobiliary: Hypodense 8 mm lesion in the right lobe of the liver is unchanged from prior. No solid enhancing hepatic lesion. Gallbladder is unremarkable. No biliary ductal dilation. Pancreas: No pancreatic ductal dilation or evidence of acute inflammation. Spleen: No splenomegaly or focal splenic lesion. Adrenals/Urinary Tract: Bilateral adrenal glands appear normal. No hydronephrosis. Exophytic 2.2 cm left renal lesion measures fluid density consistent with a cyst and considered benign requiring no imaging follow-up. Urinary bladder is unremarkable for degree of distension. Stomach/Bowel: Radiopaque enteric contrast material traverses the rectum. Stomach is minimally distended limiting evaluation. No pathologic dilation of small or large bowel. No evidence of acute bowel inflammation. Surgical change of partial right hemicolectomy without suspicious enhancing soft tissue nodularity along the suture line.  Vascular/Lymphatic: Aortic atherosclerosis without abdominal aortic aneurysm. Prominent retroperitoneal lymph nodes are slightly increased from prior for instance a precaval lymph node on image 80/2 measuring 7 mm previously measured 4 mm. New nodularity/lymph nodes in the central mesentery for instance on images 65-60 8/2 measuring up to 10 mm on image 65/2. Stable prominent right external iliac lymph node measuring 9 mm on image 99/2. Reproductive: Prostatomegaly. Other: No significant abdominopelvic free fluid.   No omental caking. Musculoskeletal: No aggressive lytic or blastic lesion of bone. Multilevel degenerative changes spine. Degenerative changes bilateral hips. IMPRESSION: 1. New right-sided pulmonary nodules measure up to 11 mm, nonspecific but concerning for metastatic disease. Consider further evaluation with nuclear medicine PET/CT. 2. New nodularity/lymph nodes in the central mesentery, concerning for nodal disease involvement. 3. Slightly increased size of prominent retroperitoneal lymph nodes, nonspecific but also somewhat concerning for nodal disease involvement. 4. Surgical change of partial colectomy without evidence of local recurrence. 5. Stable prominent mediastinal lymph nodes, nonspecific but stability is reassuring. Attention on follow-up imaging suggested. 6. Stable prominent right external iliac lymph node, nonspecific but stability is reassuring. Attention on follow-up imaging suggested. 7. Stable hypodense 8 mm hepatic lesion technically too small to accurately characterize but stability is reassuring. Attention on follow-up imaging suggested. 8. 1.9 cm incidental left thyroid nodule. Recommend thyroid US. Reference: J Am Coll Radiol. 2015 Feb;12(2): 143-50 9.  Aortic Atherosclerosis (ICD10-I70.0). These results will be called to the ordering clinician or representative by the Radiologist Assistant, and communication documented in the PACS or [employer]. Electronically Signed   By: Dahlia Bailiff M.D.   On: 01/25/2022 16:15     Past medical hx Past Medical History:  Diagnosis Date   Bilateral leg edema 08/24/2019   Cancer ([place])    skin ca, colon   Chronic cough    Chronic pain of left knee 12/01/2015   COVID-19    06/2019   Dysrhythmia    afib  was cardioverted   Essential hypertension 08/24/2019   Mixed hyperlipidemia 08/24/2019   Obesity (BMI 30-39.9) 08/24/2019   Persistent atrial fibrillation ([place]) 08/24/2019     Social History   Tobacco Use   Smoking status: Never    Smokeless tobacco: Never  Vaping Use   Vaping Use: Never used  Substance Use Topics   Alcohol use: Never   Drug use: Never    Mr.Madariaga reports that he has never smoked. He has never used smokeless tobacco. He reports that he does not drink alcohol and does not use drugs.  Tobacco Cessation: Counseling given: Not Answered   Past surgical hx, Family hx, Social hx all reviewed.  Current Outpatient Medications on File Prior to Visit  Medication Sig   acetaminophen (TYLENOL) 500 MG tablet Take 500-1,000 mg by mouth every 6 (six) hours as needed (pain.).   Ascorbic Acid (VITAMIN C WITH ROSE HIPS) 500 MG tablet Take 500 mg by mouth every evening.   Cholecalciferol (D3-1000) 25 MCG (1000 UT) capsule Take 1,000 Units by mouth every evening.   ELIQUIS 5 MG TABS tablet Take 1 tablet (5 mg total) by mouth 2 (two) times daily.   lidocaine-prilocaine (EMLA) cream Apply 1 Application topically as needed.   metoprolol tartrate (LOPRESSOR) 25 MG tablet Take 1 tablet (25 mg total) by mouth 2 (two) times daily.   Misc Natural Products (JOINT SUPPORT PO) Take 1 tablet by mouth daily. Instaflex Advanced Joint Support   nitroGLYCERIN (NITROSTAT) 0.4 MG SL tablet Place 1 tablet (0.4 mg total)  under the tongue every 5 (five) minutes as needed.   ondansetron (ZOFRAN) 8 MG tablet Take 1 tablet every 8 hours as needed for nausea/vomiting starting on 3 days after chemotherapy   vitamin B-12 (CYANOCOBALAMIN) 1000 MCG tablet Take 1,000 mcg by mouth every evening.   Zinc 50 MG CAPS Take 50 mg by mouth every evening.   prochlorperazine (COMPAZINE) 10 MG tablet Take 1 tablet (10 mg total) by mouth every 6 (six) hours as needed.   No current facility-administered medications on file prior to visit.     No Known Allergies  Review Of Systems:  Constitutional:   No  weight loss, night sweats,  Fevers, chills, fatigue, or  lassitude.  HEENT:   No headaches,  Difficulty swallowing,  Tooth/dental problems, or   Sore throat,                No sneezing, itching, ear ache, nasal congestion, post nasal drip,   CV:  No chest pain,  Orthopnea, PND, swelling in lower extremities, anasarca, dizziness, palpitations, syncope.   GI  No heartburn, indigestion, abdominal pain, nausea, vomiting, diarrhea, change in bowel habits, loss of appetite, bloody stools.   Resp: No shortness of breath with exertion or at rest.  No excess mucus, no productive cough,  No non-productive cough,  No coughing up of blood.  No change in color of mucus.  No wheezing.  No chest wall deformity  Skin: no rash or lesions.  GU: no dysuria, change in color of urine, no urgency or frequency.  No flank pain, no hematuria   MS:  No joint pain or swelling.  No decreased range of motion.  No back pain.  Psych:  No change in mood or affect. No depression or anxiety.  No memory loss.   Vital Signs BP (!) 144/78 (BP Location: Right Arm, Cuff Size: Normal)   Pulse 98   Temp 98.1 F (36.7 C) (Temporal)   Ht 5\' 11"  (1.803 m)   Wt 248 lb 3.2 oz (112.6 kg)   SpO2 98%   BMI 34.62 kg/m    Physical Exam:  General- No distress,  A&Ox3 ENT: No sinus tenderness, TM clear, pale nasal mucosa, no oral exudate,no post nasal drip, no LAN Cardiac: S1, S2, regular rate and rhythm, no murmur Chest: No wheeze/ rales/ dullness; no accessory muscle use, no nasal flaring, no sternal retractions Abd.: Soft Non-tender Ext: No clubbing cyanosis, edema Neuro:  normal strength Skin: No rashes, warm and dry Psych: normal mood and behavior   Assessment/Plan  No problem-specific Assessment & Plan notes found for this encounter.    Magdalen Spatz, NP 02/16/2022  12:54 PM

## 2022-02-17 ENCOUNTER — Other Ambulatory Visit: Payer: Self-pay

## 2022-02-17 ENCOUNTER — Telehealth: Payer: Self-pay | Admitting: Physician Assistant

## 2022-02-17 ENCOUNTER — Encounter: Payer: Self-pay | Admitting: Acute Care

## 2022-02-17 ENCOUNTER — Other Ambulatory Visit: Payer: Self-pay | Admitting: Radiology

## 2022-02-17 DIAGNOSIS — C44519 Basal cell carcinoma of skin of other part of trunk: Secondary | ICD-10-CM | POA: Diagnosis not present

## 2022-02-17 DIAGNOSIS — L578 Other skin changes due to chronic exposure to nonionizing radiation: Secondary | ICD-10-CM | POA: Diagnosis not present

## 2022-02-17 DIAGNOSIS — L821 Other seborrheic keratosis: Secondary | ICD-10-CM | POA: Diagnosis not present

## 2022-02-17 DIAGNOSIS — C44311 Basal cell carcinoma of skin of nose: Secondary | ICD-10-CM | POA: Diagnosis not present

## 2022-02-17 DIAGNOSIS — L814 Other melanin hyperpigmentation: Secondary | ICD-10-CM | POA: Diagnosis not present

## 2022-02-17 LAB — GUARDANT 360

## 2022-02-17 NOTE — Telephone Encounter (Signed)
I called the patient's daughter to address her questions regarding the utility of the guardian reveal test.  She also wanted to clarify what the foundation 1 testing (results pending) would tell us.  She also wanted to clarify the addendum on the 01/09/2021 molecular pathology report.  He was appreciative of the call.  She was wanting to know who to reach out to for questions in the future.  Discussed for any nonemergent questions MyChart or phone call is appropriate.  For any urgent needs treatment related side effect questions, I would always recommend calling the clinic and speaking to a staff member directly.

## 2022-02-18 ENCOUNTER — Other Ambulatory Visit: Payer: Self-pay

## 2022-02-18 ENCOUNTER — Encounter (HOSPITAL_COMMUNITY): Payer: Self-pay

## 2022-02-18 ENCOUNTER — Ambulatory Visit (HOSPITAL_COMMUNITY)
Admission: RE | Admit: 2022-02-18 | Discharge: 2022-02-18 | Disposition: A | Discharge status: Home / Self Care | Payer: Medicare HMO | Source: Ambulatory Visit | Attending: Physician Assistant | Admitting: Physician Assistant

## 2022-02-18 DIAGNOSIS — C189 Malignant neoplasm of colon, unspecified: Secondary | ICD-10-CM | POA: Insufficient documentation

## 2022-02-18 DIAGNOSIS — C19 Malignant neoplasm of rectosigmoid junction: Secondary | ICD-10-CM | POA: Diagnosis not present

## 2022-02-18 DIAGNOSIS — I4819 Other persistent atrial fibrillation: Secondary | ICD-10-CM | POA: Diagnosis not present

## 2022-02-18 DIAGNOSIS — Z452 Encounter for adjustment and management of vascular access device: Secondary | ICD-10-CM | POA: Diagnosis not present

## 2022-02-18 DIAGNOSIS — C7800 Secondary malignant neoplasm of unspecified lung: Secondary | ICD-10-CM | POA: Diagnosis not present

## 2022-02-18 DIAGNOSIS — I10 Essential (primary) hypertension: Secondary | ICD-10-CM | POA: Diagnosis not present

## 2022-02-18 HISTORY — PX: IR IMAGING GUIDED PORT INSERTION: IMG5740

## 2022-02-18 MED ORDER — MIDAZOLAM HCL 2 MG/2ML IJ SOLN
INTRAMUSCULAR | Status: AC
  Filled 2022-02-18: qty 4

## 2022-02-18 MED ORDER — HEPARIN SOD (PORK) LOCK FLUSH 100 UNIT/ML IV SOLN
INTRAVENOUS | Status: AC | PRN
  Administered 2022-02-18: 500 [IU] via INTRAVENOUS

## 2022-02-18 MED ORDER — LIDOCAINE-EPINEPHRINE 1 %-1:100000 IJ SOLN
INTRAMUSCULAR | Status: AC
  Filled 2022-02-18: qty 1

## 2022-02-18 MED ORDER — LIDOCAINE-EPINEPHRINE 1 %-1:100000 IJ SOLN
INTRAMUSCULAR | Status: AC | PRN
  Administered 2022-02-18: 20 mL

## 2022-02-18 MED ORDER — SODIUM CHLORIDE 0.9 % IV SOLN: INTRAVENOUS | Status: DC

## 2022-02-18 MED ORDER — HEPARIN SOD (PORK) LOCK FLUSH 100 UNIT/ML IV SOLN
INTRAVENOUS | Status: AC
  Filled 2022-02-18: qty 5

## 2022-02-18 MED ORDER — FENTANYL CITRATE (PF) 100 MCG/2ML IJ SOLN
INTRAMUSCULAR | Status: AC
  Filled 2022-02-18: qty 2

## 2022-02-18 NOTE — Procedures (Signed)
Vascular and Interventional Radiology Procedure Note  Patient: Thomas Knight DOB: [birth year] Medical Record Number: [id number] Note Date/Time: 02/18/22 2:57 PM   Performing Physician: Michaelle Birks, MD Assistant(s): None  Diagnosis: Colon cancer, metastatic  Procedure: PORT PLACEMENT  Anesthesia: Conscious Sedation Complications: None Estimated Blood Loss: Minimal  Findings:  Successful right-sided port placement, with the tip of the catheter in the proximal right atrium.  Plan: Catheter ready for use.  See detailed procedure note with images in PACS. The patient tolerated the procedure well without incident or complication and was returned to Recovery in stable condition.    Michaelle Birks, MD [facility]   Pager. [place]

## 2022-02-18 NOTE — H&P (Signed)
Chief Complaint: Patient was seen in consultation today for metastatic adenocarcinoma of the lung at the request of [place]  Referring Physician(s): Heilingoetter,Cassandra L  Supervising Physician: Michaelle Birks  Patient Status: [facility] - Out-pt  History of Present Illness: Thomas Knight is a 76 y.o. male past medical history significant for persistent A-fib, HTN and colon cancer with newly diagnosed metastatic adenocarcinoma of the lung.  Patient underwent CT chest abdomen pelvis 01/25/2022 for colon cancer staging.  Scan resulted multiple new pulmonary nodules in right upper lobe. Patient is followed by Dr. Burr Medico, oncology and has been referred to IR for tunneled catheter with port placement for chemotherapy.  Past Medical History:  Diagnosis Date   Bilateral leg edema 08/24/2019   Cancer ([place])    skin ca, colon   Chronic cough    Chronic pain of left knee 12/01/2015   COVID-19    06/2019   Dysrhythmia    afib  was cardioverted   Essential hypertension 08/24/2019   Mixed hyperlipidemia 08/24/2019   Obesity (BMI 30-39.9) 08/24/2019   Persistent atrial fibrillation ([place]) 08/24/2019    Past Surgical History:  Procedure Laterality Date   ACHILLES TENDON REPAIR Left 2012   Ruptured   BRONCHIAL BIOPSY  02/09/2022   Procedure: BRONCHIAL BIOPSIES;  Surgeon: Garner Nash, DO;  Location: [facility];  Service: Pulmonary;;   BRONCHIAL NEEDLE ASPIRATION BIOPSY  02/09/2022   Procedure: BRONCHIAL NEEDLE ASPIRATION BIOPSIES;  Surgeon: Garner Nash, DO;  Location: [facility];  Service: Pulmonary;;   COLONOSCOPY WITH PROPOFOL N/A 12/11/2021   Procedure: COLONOSCOPY WITH PROPOFOL;  Surgeon: Carol Ada, MD;  Location: [facility];  Service: Gastroenterology;  Laterality: N/A;   HERNIA REPAIR     umbilical hernia   SKIN SURGERY Left    Basal cell carcinoma on left forearm   XI ROBOTIC ASSISTED LOWER ANTERIOR RESECTION N/A 01/09/2021   Procedure: XI ROBOTIC  ASSISTED RIGHT HEMI COLECTOMY, LYSIS OF ADHESIONS, SMALL BOWEL RESECTION, INTRAOPERATIVE ASSESMENT OF PERFUSION, PARTIAL OMENTECTOMY;  Surgeon: Ileana Roup, MD;  Location: [facility];  Service: General;  Laterality: N/A;    Allergies: Patient has no known allergies.  Medications: Prior to Admission medications   Medication Sig Start Date End Date Taking? Authorizing Provider  acetaminophen (TYLENOL) 500 MG tablet Take 500-1,000 mg by mouth every 6 (six) hours as needed (pain.).    [provider]  Ascorbic Acid (VITAMIN C WITH ROSE HIPS) 500 MG tablet Take 500 mg by mouth every evening.    [provider]  Cholecalciferol (D3-1000) 25 MCG (1000 UT) capsule Take 1,000 Units by mouth every evening.    [provider]  ELIQUIS 5 MG TABS tablet Take 1 tablet (5 mg total) by mouth 2 (two) times daily. 01/07/22   Tobb, Kardie, DO  lidocaine-prilocaine (EMLA) cream Apply 1 Application topically as needed. 02/12/22   Heilingoetter, Cassandra L, PA-C  lidocaine-prilocaine (EMLA) cream Apply to affected area once 02/16/22   Truitt Merle, MD  metoprolol tartrate (LOPRESSOR) 25 MG tablet Take 1 tablet (25 mg total) by mouth 2 (two) times daily. 11/17/21   Tobb, Kardie, DO  Misc Natural Products (JOINT SUPPORT PO) Take 1 tablet by mouth daily. Instaflex Advanced Joint Support    [provider]  nitroGLYCERIN (NITROSTAT) 0.4 MG SL tablet Place 1 tablet (0.4 mg total) under the tongue every 5 (five) minutes as needed. 01/30/21 02/06/24  Tobb, Kardie, DO  ondansetron (ZOFRAN) 8 MG tablet Take 1 tablet every 8 hours as  needed for nausea/vomiting starting on 3 days after chemotherapy 02/12/22   Heilingoetter, Cassandra L, PA-C  ondansetron (ZOFRAN) 8 MG tablet Take 1 tablet (8 mg total) by mouth every 8 (eight) hours as needed for nausea or vomiting. Start on the third day after chemotherapy. 02/16/22   Truitt Merle, MD  prochlorperazine (COMPAZINE) 10 MG tablet Take 1 tablet (10 mg  total) by mouth every 6 (six) hours as needed. 02/12/22   Heilingoetter, Cassandra L, PA-C  prochlorperazine (COMPAZINE) 10 MG tablet Take 1 tablet (10 mg total) by mouth every 6 (six) hours as needed for nausea or vomiting. 02/16/22   Truitt Merle, MD  vitamin B-12 (CYANOCOBALAMIN) 1000 MCG tablet Take 1,000 mcg by mouth every evening.    [provider]  Zinc 50 MG CAPS Take 50 mg by mouth every evening.    [provider]     Family History  Problem Relation Age of Onset   Lung cancer Father     Social History   Socioeconomic History   Marital status: Married    Spouse name: Not on file   Number of children: Not on file   Years of education: Not on file   Highest education level: Not on file  Occupational History   Not on file  Tobacco Use   Smoking status: Never   Smokeless tobacco: Never  Vaping Use   Vaping Use: Never used  Substance and Sexual Activity   Alcohol use: Never   Drug use: Never   Sexual activity: Not Currently  Other Topics Concern   Not on file  Social History Narrative   Not on file   Social Determinants of Health   Financial Resource Strain: Not on file  Food Insecurity: Not on file  Transportation Needs: Not on file  Physical Activity: Not on file  Stress: Not on file  Social Connections: Not on file    Review of Systems: A 12 point ROS discussed and pertinent positives are indicated in the HPI above.  All other systems are negative.  Review of Systems  Constitutional:  Negative for chills and fever.  Respiratory:  Positive for cough. Negative for shortness of breath.   Cardiovascular:  Negative for chest pain.  Gastrointestinal:  Negative for blood in stool.  Neurological:  Negative for dizziness and headaches.    Vital Signs: Ht 5\' 11"  (1.803 m)   Wt 247 lb (112 kg)   BMI 34.45 kg/m     Physical Exam Vitals reviewed.  Constitutional:      General: He is not in acute distress.    Appearance: Normal appearance. He  is not ill-appearing.  HENT:     Mouth/Throat:     Mouth: Mucous membranes are dry.     Pharynx: Oropharynx is clear.  Eyes:     Extraocular Movements: Extraocular movements intact.     Pupils: Pupils are equal, round, and reactive to light.  Cardiovascular:     Rate and Rhythm: Normal rate and regular rhythm.     Pulses: Normal pulses.     Heart sounds: Normal heart sounds.  Pulmonary:     Effort: Pulmonary effort is normal. No respiratory distress.     Breath sounds: Normal breath sounds.  Abdominal:     General: Bowel sounds are normal. There is no distension.     Palpations: Abdomen is soft.     Tenderness: There is no abdominal tenderness. There is no guarding.  Musculoskeletal:     Right lower leg:  No edema.     Left lower leg: No edema.  Skin:    General: Skin is warm and dry.  Neurological:     Mental Status: He is alert and oriented to person, place, and time.  Psychiatric:        Mood and Affect: Mood normal.        Behavior: Behavior normal.        Thought Content: Thought content normal.        Judgment: Judgment normal.     Imaging: DG Chest Port 1 View  Result Date: 02/09/2022 CLINICAL DATA:  Post bronchoscopy EXAM: PORTABLE CHEST 1 VIEW COMPARISON:  07/08/2019 FINDINGS: The heart size and mediastinal contours are within normal limits. Both lungs are clear. Pulmonary nodules better seen on prior CT. No pleural effusion or pneumothorax. The visualized skeletal structures are unremarkable. IMPRESSION: No pneumothorax. Electronically Signed   By: Macy Mis M.D.   On: 02/09/2022 11:44   DG C-ARM BRONCHOSCOPY  Result Date: 02/09/2022 C-ARM BRONCHOSCOPY: Fluoroscopy was utilized by the requesting physician.  No radiographic interpretation.   DG C-Arm 1-60 Min-No Report  Result Date: 02/09/2022 Fluoroscopy was utilized by the requesting physician.  No radiographic interpretation.   NM PET Image Initial (PI) Skull Base To Thigh  Result Date:  02/05/2022 CLINICAL DATA:  Subsequent treatment strategy for prior history of colon cancer. New pulmonary nodules. EXAM: NUCLEAR MEDICINE PET SKULL BASE TO THIGH TECHNIQUE: 12.4 mCi F-18 FDG was injected intravenously. Full-ring PET imaging was performed from the skull base to thigh after the radiotracer. CT data was obtained and used for attenuation correction and anatomic localization. Fasting blood glucose: 82 mg/dl COMPARISON:  CT scan 01/25/2022 FINDINGS: Mediastinal blood pool activity: SUV max 2.07 Liver activity: SUV max NA NECK: No hypermetabolic lymph nodes in the neck. Incidental CT findings: None. CHEST: The 2 larger right-sided pulmonary nodules do not show any hypermetabolism. The 11 mm lateral nodule in the right upper lobe has an SUV max of 1.81 and a 10 mm right lower lobe nodule has an SUV max of 1.71. No enlarged or hypermetabolic mediastinal hilar lymph nodes. No supraclavicular or axillary adenopathy. Incidental CT findings: Stable mild atherosclerotic calcifications involving the aorta and coronary arteries. ABDOMEN/PELVIS: No findings to suggest hepatic metastatic disease. The adrenal glands, pancreas, spleen and both kidneys are unremarkable. Small scattered lymph nodes in the root of the small bowel mesentery do not demonstrate any hypermetabolism to suggest metastatic adenopathy. No retroperitoneal adenopathy. No pelvic adenopathy. Incidental CT findings: Minimal vascular calcifications for age. Prostate gland is enlarged but no areas of hypermetabolism. Colonic diverticulosis appears stable. SKELETON: No focal hypermetabolic activity to suggest skeletal metastasis. Incidental CT findings: None. IMPRESSION: 1. The right lung pulmonary nodules do not show any hypermetabolism. These remain indeterminate. Possibilities would include biopsy or observation (follow-up noncontrast chest CT in 3 months and if they enlarge, then proceeded to biopsy). 2. No findings for abdominal/pelvic metastatic  disease. The mesenteric lymph nodes are not hypermetabolic and likely reactive. These could also be followed. Electronically Signed   By: Marijo Sanes M.D.   On: 02/05/2022 14:15   CT CHEST ABDOMEN PELVIS W CONTRAST  Result Date: 01/25/2022 CLINICAL DATA:  Colon cancer staging.  * Tracking Code: BO * EXAM: CT CHEST, ABDOMEN, AND PELVIS WITH CONTRAST TECHNIQUE: Multidetector CT imaging of the chest, abdomen and pelvis was performed following the standard protocol during bolus administration of intravenous contrast. RADIATION DOSE REDUCTION: This exam was performed according to the departmental  dose-optimization program which includes automated exposure control, adjustment of the mA and/or kV according to patient size and/or use of iterative reconstruction technique. CONTRAST:  139mL ISOVUE-300 IOPAMIDOL (ISOVUE-300) INJECTION 61% COMPARISON:  CT Nov 06, 2020 FINDINGS: CT CHEST FINDINGS Cardiovascular: Aortic atherosclerosis. No central pulmonary embolus on this nondedicated study. Normal size heart. No significant pericardial effusion/thickening. Mediastinum/Nodes: No supraclavicular adenopathy. Hypodense 19 mm nodule in the left lobe of the thyroid. Prominent mediastinal lymph nodes not pathologically enlarged by size criteria are similar prior. No pathologically enlarged mediastinal, hilar or axillary lymph nodes. The esophagus is grossly unremarkable. Lungs/Pleura: New pulmonary nodule in the peripheral right upper lobe measuring 11 mm on image 59/3. New posterior peripheral 10 mm right upper lobe pulmonary nodule on image 49/3. New 3 mm central right upper lobe pulmonary nodule on image 55/3. Musculoskeletal: No aggressive lytic or blastic lesion of bone. No suspicious chest wall mass. Multilevel degenerative changes spine with bridging anterior vertebral osteophytes. CT ABDOMEN PELVIS FINDINGS Hepatobiliary: Hypodense 8 mm lesion in the right lobe of the liver is unchanged from prior. No solid enhancing  hepatic lesion. Gallbladder is unremarkable. No biliary ductal dilation. Pancreas: No pancreatic ductal dilation or evidence of acute inflammation. Spleen: No splenomegaly or focal splenic lesion. Adrenals/Urinary Tract: Bilateral adrenal glands appear normal. No hydronephrosis. Exophytic 2.2 cm left renal lesion measures fluid density consistent with a cyst and considered benign requiring no imaging follow-up. Urinary bladder is unremarkable for degree of distension. Stomach/Bowel: Radiopaque enteric contrast material traverses the rectum. Stomach is minimally distended limiting evaluation. No pathologic dilation of small or large bowel. No evidence of acute bowel inflammation. Surgical change of partial right hemicolectomy without suspicious enhancing soft tissue nodularity along the suture line. Vascular/Lymphatic: Aortic atherosclerosis without abdominal aortic aneurysm. Prominent retroperitoneal lymph nodes are slightly increased from prior for instance a precaval lymph node on image 80/2 measuring 7 mm previously measured 4 mm. New nodularity/lymph nodes in the central mesentery for instance on images 65-60 8/2 measuring up to 10 mm on image 65/2. Stable prominent right external iliac lymph node measuring 9 mm on image 99/2. Reproductive: Prostatomegaly. Other: No significant abdominopelvic free fluid.  No omental caking. Musculoskeletal: No aggressive lytic or blastic lesion of bone. Multilevel degenerative changes spine. Degenerative changes bilateral hips. IMPRESSION: 1. New right-sided pulmonary nodules measure up to 11 mm, nonspecific but concerning for metastatic disease. Consider further evaluation with nuclear medicine PET/CT. 2. New nodularity/lymph nodes in the central mesentery, concerning for nodal disease involvement. 3. Slightly increased size of prominent retroperitoneal lymph nodes, nonspecific but also somewhat concerning for nodal disease involvement. 4. Surgical change of partial colectomy  without evidence of local recurrence. 5. Stable prominent mediastinal lymph nodes, nonspecific but stability is reassuring. Attention on follow-up imaging suggested. 6. Stable prominent right external iliac lymph node, nonspecific but stability is reassuring. Attention on follow-up imaging suggested. 7. Stable hypodense 8 mm hepatic lesion technically too small to accurately characterize but stability is reassuring. Attention on follow-up imaging suggested. 8. 1.9 cm incidental left thyroid nodule. Recommend thyroid US. Reference: J Am Coll Radiol. 2015 Feb;12(2): 143-50 9.  Aortic Atherosclerosis (ICD10-I70.0). These results will be called to the ordering clinician or representative by the Radiologist Assistant, and communication documented in the PACS or [employer]. Electronically Signed   By: Dahlia Bailiff M.D.   On: 01/25/2022 16:15    Labs:  CBC: Recent Labs    02/09/22 0731  WBC 6.2  HGB 17.1*  HCT 52.2*  PLT 193  COAGS: No results for input(s): "INR", "APTT" in the last 8760 hours.  BMP: Recent Labs    02/09/22 0731  NA 138  K 4.3  CL 108  CO2 19*  GLUCOSE 106*  BUN 12  CALCIUM 8.6*  CREATININE 0.92  GFRNONAA >60    LIVER FUNCTION TESTS: No results for input(s): "BILITOT", "AST", "ALT", "ALKPHOS", "PROT", "ALBUMIN" in the last 8760 hours.  TUMOR MARKERS: No results for input(s): "AFPTM", "CEA", "CA199", "CHROMGRNA" in the last 8760 hours.  Assessment and Plan: History of A-fib, HTN and colon cancer with newly diagnosed metastatic adenocarcinoma of the lung.  Patient underwent CT chest abdomen pelvis 01/25/2022 for colon cancer staging.  Scan resulted multiple new pulmonary nodules in right upper lobe. Patient is followed by Dr. Burr Medico, oncology and has been referred to IR for tunneled catheter with port placement for chemotherapy.  Pt resting on stretcher. He is A&O.  He is in no distress.  Pt is NPO per order.   Risks and benefits of image guided tunneled  catheter placement with moderate sedation was discussed with the patient including, but not limited to bleeding, infection, pneumothorax, or fibrin sheath development and need for additional procedures.  All of the patient's questions were answered, patient is agreeable to proceed. Consent signed and in chart.   Thank you for this interesting consult.  I greatly enjoyed meeting [employer] and look forward to participating in their care.  A copy of this report was sent to the requesting provider on this date.  Electronically Signed: Tyson Alias, NP 02/18/2022, 1:38 PM   I spent a total of 20 minutes in face to face in clinical consultation, greater than 50% of which was counseling/coordinating care for metastatic adenocarcinoma of lung.

## 2022-02-19 DIAGNOSIS — I10 Essential (primary) hypertension: Secondary | ICD-10-CM | POA: Diagnosis not present

## 2022-02-19 DIAGNOSIS — Z6835 Body mass index (BMI) 35.0-35.9, adult: Secondary | ICD-10-CM | POA: Diagnosis not present

## 2022-02-19 DIAGNOSIS — Z0000 Encounter for general adult medical examination without abnormal findings: Secondary | ICD-10-CM | POA: Diagnosis not present

## 2022-02-19 DIAGNOSIS — Z85038 Personal history of other malignant neoplasm of large intestine: Secondary | ICD-10-CM | POA: Diagnosis not present

## 2022-02-19 DIAGNOSIS — I2510 Atherosclerotic heart disease of native coronary artery without angina pectoris: Secondary | ICD-10-CM | POA: Diagnosis not present

## 2022-02-19 DIAGNOSIS — Z1331 Encounter for screening for depression: Secondary | ICD-10-CM | POA: Diagnosis not present

## 2022-02-20 ENCOUNTER — Other Ambulatory Visit: Payer: Self-pay

## 2022-02-22 ENCOUNTER — Inpatient Hospital Stay: Payer: Medicare HMO

## 2022-02-22 ENCOUNTER — Encounter: Payer: Self-pay | Admitting: Hematology

## 2022-02-22 ENCOUNTER — Telehealth: Payer: Self-pay | Admitting: Hematology

## 2022-02-22 DIAGNOSIS — C189 Malignant neoplasm of colon, unspecified: Secondary | ICD-10-CM

## 2022-02-22 DIAGNOSIS — Z008 Encounter for other general examination: Secondary | ICD-10-CM | POA: Diagnosis not present

## 2022-02-22 DIAGNOSIS — D6869 Other thrombophilia: Secondary | ICD-10-CM | POA: Diagnosis not present

## 2022-02-22 DIAGNOSIS — M1990 Unspecified osteoarthritis, unspecified site: Secondary | ICD-10-CM | POA: Diagnosis not present

## 2022-02-22 DIAGNOSIS — I25119 Atherosclerotic heart disease of native coronary artery with unspecified angina pectoris: Secondary | ICD-10-CM | POA: Diagnosis not present

## 2022-02-22 DIAGNOSIS — R112 Nausea with vomiting, unspecified: Secondary | ICD-10-CM | POA: Diagnosis not present

## 2022-02-22 DIAGNOSIS — E669 Obesity, unspecified: Secondary | ICD-10-CM | POA: Diagnosis not present

## 2022-02-22 DIAGNOSIS — E785 Hyperlipidemia, unspecified: Secondary | ICD-10-CM | POA: Diagnosis not present

## 2022-02-22 DIAGNOSIS — I10 Essential (primary) hypertension: Secondary | ICD-10-CM | POA: Diagnosis not present

## 2022-02-22 DIAGNOSIS — I771 Stricture of artery: Secondary | ICD-10-CM | POA: Diagnosis not present

## 2022-02-22 DIAGNOSIS — I4891 Unspecified atrial fibrillation: Secondary | ICD-10-CM | POA: Diagnosis not present

## 2022-02-22 DIAGNOSIS — C7800 Secondary malignant neoplasm of unspecified lung: Secondary | ICD-10-CM | POA: Diagnosis not present

## 2022-02-22 DIAGNOSIS — G4733 Obstructive sleep apnea (adult) (pediatric): Secondary | ICD-10-CM | POA: Diagnosis not present

## 2022-02-22 NOTE — Telephone Encounter (Signed)
Scheduled follow-up appointments per 8/22 appointment request workqueue. Patient is aware.

## 2022-02-23 ENCOUNTER — Other Ambulatory Visit: Payer: Self-pay

## 2022-02-23 ENCOUNTER — Encounter (HOSPITAL_COMMUNITY): Payer: Self-pay | Admitting: Hematology

## 2022-02-23 DIAGNOSIS — C189 Malignant neoplasm of colon, unspecified: Secondary | ICD-10-CM | POA: Diagnosis not present

## 2022-02-25 ENCOUNTER — Inpatient Hospital Stay: Payer: Medicare HMO

## 2022-02-25 ENCOUNTER — Inpatient Hospital Stay: Payer: Medicare HMO | Admitting: Hematology

## 2022-02-25 DIAGNOSIS — E785 Hyperlipidemia, unspecified: Secondary | ICD-10-CM | POA: Diagnosis not present

## 2022-02-25 DIAGNOSIS — I10 Essential (primary) hypertension: Secondary | ICD-10-CM | POA: Diagnosis not present

## 2022-03-03 ENCOUNTER — Other Ambulatory Visit: Payer: Self-pay

## 2022-03-03 ENCOUNTER — Encounter: Payer: Self-pay | Admitting: Hematology

## 2022-03-03 ENCOUNTER — Inpatient Hospital Stay: Payer: Medicare HMO | Attending: Physician Assistant

## 2022-03-03 ENCOUNTER — Inpatient Hospital Stay: Payer: Medicare HMO

## 2022-03-03 ENCOUNTER — Inpatient Hospital Stay (HOSPITAL_BASED_OUTPATIENT_CLINIC_OR_DEPARTMENT_OTHER): Payer: Medicare HMO | Admitting: Hematology

## 2022-03-03 VITALS — BP 156/75 | HR 58 | Temp 98.0°F | Resp 17 | Wt 246.2 lb

## 2022-03-03 DIAGNOSIS — C7801 Secondary malignant neoplasm of right lung: Secondary | ICD-10-CM | POA: Diagnosis not present

## 2022-03-03 DIAGNOSIS — C189 Malignant neoplasm of colon, unspecified: Secondary | ICD-10-CM

## 2022-03-03 DIAGNOSIS — C184 Malignant neoplasm of transverse colon: Secondary | ICD-10-CM | POA: Insufficient documentation

## 2022-03-03 DIAGNOSIS — Z5111 Encounter for antineoplastic chemotherapy: Secondary | ICD-10-CM | POA: Diagnosis not present

## 2022-03-03 DIAGNOSIS — I10 Essential (primary) hypertension: Secondary | ICD-10-CM | POA: Diagnosis not present

## 2022-03-03 DIAGNOSIS — C7800 Secondary malignant neoplasm of unspecified lung: Secondary | ICD-10-CM

## 2022-03-03 DIAGNOSIS — K769 Liver disease, unspecified: Secondary | ICD-10-CM | POA: Diagnosis not present

## 2022-03-03 LAB — CMP (CANCER CENTER ONLY)
ALT: 21 U/L (ref 0–44)
AST: 20 U/L (ref 15–41)
Albumin: 4.1 g/dL (ref 3.5–5.0)
Alkaline Phosphatase: 61 U/L (ref 38–126)
Anion gap: 5 (ref 5–15)
BUN: 15 mg/dL (ref 8–23)
CO2: 28 mmol/L (ref 22–32)
Calcium: 9.2 mg/dL (ref 8.9–10.3)
Chloride: 105 mmol/L (ref 98–111)
Creatinine: 0.91 mg/dL (ref 0.61–1.24)
GFR, Estimated: >60 mL/min (ref >60)
Glucose, Bld: 109 mg/dL — ABNORMAL HIGH (ref 70–99)
Potassium: 3.9 mmol/L (ref 3.5–5.1)
Sodium: 138 mmol/L (ref 135–145)
Total Bilirubin: 0.6 mg/dL (ref 0.3–1.2)
Total Protein: 7 g/dL (ref 6.5–8.1)

## 2022-03-03 LAB — CBC WITH DIFFERENTIAL (CANCER CENTER ONLY)
Abs Immature Granulocytes: 0.03 10*3/uL (ref 0.00–0.07)
Basophils Absolute: 0.1 10*3/uL (ref 0.0–0.1)
Basophils Relative: 1 %
Eosinophils Absolute: 0.1 10*3/uL (ref 0.0–0.5)
Eosinophils Relative: 1 %
HCT: 47.9 % (ref 39.0–52.0)
Hemoglobin: 16.5 g/dL (ref 13.0–17.0)
Immature Granulocytes: 1 %
Lymphocytes Relative: 18 %
Lymphs Abs: 1.2 10*3/uL (ref 0.7–4.0)
MCH: 31.6 pg (ref 26.0–34.0)
MCHC: 34.4 g/dL (ref 30.0–36.0)
MCV: 91.8 fL (ref 80.0–100.0)
Monocytes Absolute: 0.7 10*3/uL (ref 0.1–1.0)
Monocytes Relative: 11 %
Neutro Abs: 4.6 10*3/uL (ref 1.7–7.7)
Neutrophils Relative %: 68 %
Platelet Count: 182 10*3/uL (ref 150–400)
RBC: 5.22 MIL/uL (ref 4.22–5.81)
RDW: 14.5 % (ref 11.5–15.5)
WBC Count: 6.6 10*3/uL (ref 4.0–10.5)
nRBC: 0 % (ref 0.0–0.2)

## 2022-03-03 LAB — TOTAL PROTEIN, URINE DIPSTICK: Protein, ur: NEGATIVE mg/dL

## 2022-03-03 MED ORDER — OXALIPLATIN CHEMO INJECTION 100 MG/20ML
85.0000 mg/m2 | Freq: Once | INTRAVENOUS | Status: AC
  Administered 2022-03-03: 200 mg via INTRAVENOUS
  Filled 2022-03-03: qty 40

## 2022-03-03 MED ORDER — LEUCOVORIN CALCIUM INJECTION 350 MG
400.0000 mg/m2 | Freq: Once | INTRAVENOUS | Status: AC
  Administered 2022-03-03: 952 mg via INTRAVENOUS
  Filled 2022-03-03: qty 47.6

## 2022-03-03 MED ORDER — SODIUM CHLORIDE 0.9% FLUSH
10.0000 mL | INTRAVENOUS | Status: DC | PRN
  Administered 2022-03-03: 10 mL via INTRAVENOUS

## 2022-03-03 MED ORDER — PALONOSETRON HCL INJECTION 0.25 MG/5ML
0.2500 mg | Freq: Once | INTRAVENOUS | Status: AC
  Administered 2022-03-03: 0.25 mg via INTRAVENOUS
  Filled 2022-03-03: qty 5

## 2022-03-03 MED ORDER — SODIUM CHLORIDE 0.9 % IV SOLN
2400.0000 mg/m2 | INTRAVENOUS | Status: DC
  Administered 2022-03-03: 5700 mg via INTRAVENOUS
  Filled 2022-03-03: qty 114

## 2022-03-03 MED ORDER — SODIUM CHLORIDE 0.9 % IV SOLN
10.0000 mg | Freq: Once | INTRAVENOUS | Status: AC
  Administered 2022-03-03: 10 mg via INTRAVENOUS
  Filled 2022-03-03: qty 10

## 2022-03-03 MED ORDER — DEXTROSE 5 % IV SOLN: Freq: Once | INTRAVENOUS | Status: AC

## 2022-03-03 NOTE — Progress Notes (Signed)
[place]   [phone]) [phone] Fax:(336) [phone]   Clinic Follow up Note   Patient Care Team: Angelina Sheriff, MD as PCP - General (Family Medicine) Berniece Salines, DO as PCP - Cardiology (Cardiology)  Date of Service:  03/03/2022  CHIEF COMPLAINT: f/u of metastatic  CURRENT THERAPY:  FOLFOX, q14d, starting 03/03/22  ASSESSMENT & PLAN:  Thomas Knight is a 76 y.o. male with   Right side colon cancer stage II (pT3, N0), metastatic disease to the lung found in July 2023, MSS, KRAS/NRAS/BRAF wild type  -found on screening colonoscopy. S/p right hemicolectomy 01/09/21 showed 3 cm moderately differentiated adenocarcinoma of the transverse colon.  Stage is a pT3, PN0 (0/17).  MMR preserved.  -surveillance CT CAP on 01/25/22 showed: new right lung nodules, up to 11 mm; new nodularity/lymph nodes in the central mesentery. PET scan 02/04/22 showed no hypermetabolic disease. -CEA on 01/29/22 WNL at 1.97 -bronchoscopy on 02/09/22 by Dr. Valeta Harms showed adenocarcinoma in RUL, with morphologic features consistent with metastasis from a colorectal primary. -[facility] showed MSS, low tumor burden, KRAS/NRAS/BRAF wild-type, no other targetable mutations.  We discussed the benefit of EGFR inhibitor, which is much less in right-sided colon cancer, so I will not give it as first line but may consider in subsequent lines. -port placed 02/18/22. -he is scheduled to start FOLFOX today, 03/03/22. We again reviewed side effects today.  He voiced good understanding and agrees to proceed.  He knows to call us if he has severe or unexpected side effects. -I recommend adding bevacizumab from next cycle, benefit and potential side effects, especially hypertension, proteinuria, small risk of thrombosis and bleeding, bowel preparation, delayed wound healing, were discussed with him in details, he agrees to proceed.    PLAN: -proceed with C1 FOLFOX today as scheduled -lab, flush, f/u, and C2 FOLFOX and  beva on 9/20    No problem-specific Assessment & Plan notes found for this encounter.   SUMMARY OF ONCOLOGIC HISTORY: Oncology History  Colon cancer ([place])  10/2020 Procedure   Colonoscopy-Dr. Benson Norway     11/06/2020 Imaging   CT CHEST ABDOMEN PELVIS W CONTRAST   IMPRESSION: 1. 4.6 cm partially circumferential apple-core type neoplastic process involving the mid transverse colon. No findings for locoregional adenopathy or distant metastatic disease. 2. Enlarged prostate gland with median lobe hypertrophy impressing on the base of the bladder. 3. 16 mm left thyroid nodule. Recommend follow-up thyroid ultrasound examination.   01/09/2021 Initial Diagnosis   Colon cancer ([place])   01/09/2021 Pathology Results   SURGICAL PATHOLOGY   THIS IS AN ADDENDUM REPORT   CASE: [id number]  PATIENT: Thomas Knight  Surgical Pathology Report   B. COLON, RIGHT, RESECTION:  -  Adenocarcinoma, moderately differentiated, 3.0 cm  -  No carcinoma identified in seventeen lymph nodes (0/17)  -  Margins uninvolved by carcinoma  -  Tubular adenoma (x2)  -  Benign appendix  -  See oncology table and comment below   Margin Status for Invasive Carcinoma: All margins negative for  invasive carcinoma       Distance from Invasive Carcinoma to Radial (Circumferential)       Margin Status for Non-Invasive Tumor: All margins negative for  high-grade dysplasia / intramucosal carcinoma and low-grade dysplasia  Regional Lymph Nodes:       Number of Lymph Nodes with Tumor: 0       Number of Lymph Nodes Examined: 17  Tumor Deposits: 0  Distant Metastasis:  Distant Site(s) Involved: Not applicable  Pathologic Stage Classification (pTNM, AJCC 8th Edition): pT3, pN0   MMR Stable    01/09/2021 Cancer Staging   Staging form: Colon and Rectum, AJCC 8th Edition - Pathologic stage from 01/09/2021: Stage IIA (pT3, pN0, cM0) - Signed by Truitt Merle, MD on 02/12/2022 Total positive nodes: 0 Histologic grading  system: 4 grade system Histologic grade (G): G2 Residual tumor (R): R0 - None   05/2021 Tumor Marker   Patient's tumor was tested for the following markers: CEA. Results of the tumor marker test revealed 2.1.   12/11/2021 Procedure   Colonoscopy-Dr. Benson Norway  There was evidence of a prior functional end-to-end ileo-colonic anastomosis in the transverse colon. This was patent and was characterized by healthy appearing mucosa. The anastomosis was traversed. Findings: Scattered small and large-mouthed diverticula were found in the sigmoid colon. - Patent functional end-to-end ileo-colonic anastomosis, characterized by healthy appearing mucosa. - Diverticulosis in the sigmoid colon. - No specimens collected.   01/25/2022 Imaging   CT CHEST ABDOMEN PELVIS W CONTRAST   IMPRESSION: 1. New right-sided pulmonary nodules measure up to 11 mm, nonspecific but concerning for metastatic disease. Consider further evaluation with nuclear medicine PET/CT. 2. New nodularity/lymph nodes in the central mesentery, concerning for nodal disease involvement. 3. Slightly increased size of prominent retroperitoneal lymph nodes, nonspecific but also somewhat concerning for nodal disease involvement. 4. Surgical change of partial colectomy without evidence of local recurrence. 5. Stable prominent mediastinal lymph nodes, nonspecific but stability is reassuring. Attention on follow-up imaging suggested. 6. Stable prominent right external iliac lymph node, nonspecific but stability is reassuring. Attention on follow-up imaging suggested. 7. Stable hypodense 8 mm hepatic lesion technically too small to accurately characterize but stability is reassuring. Attention on follow-up imaging suggested. 8. 1.9 cm incidental left thyroid nodule. Recommend thyroid US. Reference: J Am Coll Radiol. 2015 Feb;12(2): 143-50 9.  Aortic Atherosclerosis (ICD10-I70.0).   02/09/2022 Procedure   Bronchoscopy under the care of  Dr. Valeta Harms    02/09/2022 Pathology Results   CYTOLOGY - NON PAP  CASE: [id number]  PATIENT: [name]  Non-Gynecological Cytology Report   CYTOLOGY - NON PAP  CASE: [id number]  PATIENT: [name]  Non-Gynecological Cytology Report      Clinical History: Lung Nodules     FINAL MICROSCOPIC DIAGNOSIS:   A. LUNG, RUL, FINE NEEDLE ASPIRATION  BIOPSY FORCEPS:  Adenocarcinoma with morphologic features consistent with metastasis from  a colorectal primary.  Please see comment.   Comment: The following immunostains are performed with appropriate  controls :  TTF-1: Negative .  Napsin A: Negative .  CK7: Negative.  CK20: Positive.  CDX2: Positive .   The above immuno histochemical profile is supportive of the rendered  diagnosis.  This case was also reviewed by Dr. Saralyn Pilar who concurs with the above  interpretation.    Colon cancer metastasized to lung [facility])  02/12/2022 Initial Diagnosis   Colon cancer metastasized to lung [facility])   03/03/2022 -  Chemotherapy   Patient is on Treatment Plan : COLORECTAL FOLFOX + Bevacizumab q14d        INTERVAL HISTORY:  [facility] is here for a follow up of metastatic colon cancer. He was last seen by me with PA Cassie on 02/12/22. He was seen in the infusion area. He reports he is doing well, no new concerns.   All other systems were reviewed with the patient and are negative.  MEDICAL HISTORY:  Past Medical History:  Diagnosis Date  Bilateral leg edema 08/24/2019   Cancer ([facility])    skin ca, colon   Chronic cough    Chronic pain of left knee 12/01/2015   COVID-19    06/2019   Dysrhythmia    afib  was cardioverted   Essential hypertension 08/24/2019   Mixed hyperlipidemia 08/24/2019   Obesity (BMI 30-39.9) 08/24/2019   Persistent atrial fibrillation ([place]) 08/24/2019    SURGICAL HISTORY: Past Surgical History:  Procedure Laterality Date   ACHILLES TENDON REPAIR Left 2012   Ruptured   BRONCHIAL BIOPSY   02/09/2022   Procedure: BRONCHIAL BIOPSIES;  Surgeon: Garner Nash, DO;  Location: [facility];  Service: Pulmonary;;   BRONCHIAL NEEDLE ASPIRATION BIOPSY  02/09/2022   Procedure: BRONCHIAL NEEDLE ASPIRATION BIOPSIES;  Surgeon: Garner Nash, DO;  Location: [facility];  Service: Pulmonary;;   COLONOSCOPY WITH PROPOFOL N/A 12/11/2021   Procedure: COLONOSCOPY WITH PROPOFOL;  Surgeon: Carol Ada, MD;  Location: [facility];  Service: Gastroenterology;  Laterality: N/A;   HERNIA REPAIR     umbilical hernia   IR IMAGING GUIDED PORT INSERTION  02/18/2022   SKIN SURGERY Left    Basal cell carcinoma on left forearm   XI ROBOTIC ASSISTED LOWER ANTERIOR RESECTION N/A 01/09/2021   Procedure: XI ROBOTIC ASSISTED RIGHT HEMI COLECTOMY, LYSIS OF ADHESIONS, SMALL BOWEL RESECTION, INTRAOPERATIVE ASSESMENT OF PERFUSION, PARTIAL OMENTECTOMY;  Surgeon: Ileana Roup, MD;  Location: [facility];  Service: General;  Laterality: N/A;    I have reviewed the social history and family history with the patient and they are unchanged from previous note.  ALLERGIES:  is allergic to tramadol.  MEDICATIONS:  Current Outpatient Medications  Medication Sig Dispense Refill   acetaminophen (TYLENOL) 500 MG tablet Take 500-1,000 mg by mouth every 6 (six) hours as needed (pain.).     Ascorbic Acid (VITAMIN C WITH ROSE HIPS) 500 MG tablet Take 500 mg by mouth every evening.     Cholecalciferol (D3-1000) 25 MCG (1000 UT) capsule Take 1,000 Units by mouth every evening.     ELIQUIS 5 MG TABS tablet Take 1 tablet (5 mg total) by mouth 2 (two) times daily. 180 tablet 0   lidocaine-prilocaine (EMLA) cream Apply 1 Application topically as needed. 30 g 2   lidocaine-prilocaine (EMLA) cream Apply to affected area once 30 g 3   metoprolol tartrate (LOPRESSOR) 25 MG tablet Take 1 tablet (25 mg total) by mouth 2 (two) times daily. 180 tablet 3   Misc Natural Products (JOINT SUPPORT PO) Take 1 tablet by mouth daily. Instaflex  Advanced Joint Support     nitroGLYCERIN (NITROSTAT) 0.4 MG SL tablet Place 1 tablet (0.4 mg total) under the tongue every 5 (five) minutes as needed. 25 tablet 7   ondansetron (ZOFRAN) 8 MG tablet Take 1 tablet every 8 hours as needed for nausea/vomiting starting on 3 days after chemotherapy 30 tablet 2   ondansetron (ZOFRAN) 8 MG tablet Take 1 tablet (8 mg total) by mouth every 8 (eight) hours as needed for nausea or vomiting. Start on the third day after chemotherapy. 30 tablet 1   prochlorperazine (COMPAZINE) 10 MG tablet Take 1 tablet (10 mg total) by mouth every 6 (six) hours as needed. 30 tablet 2   prochlorperazine (COMPAZINE) 10 MG tablet Take 1 tablet (10 mg total) by mouth every 6 (six) hours as needed for nausea or vomiting. 30 tablet 1   vitamin B-12 (CYANOCOBALAMIN) 1000 MCG tablet Take 1,000 mcg by mouth every evening.  Zinc 50 MG CAPS Take 50 mg by mouth every evening.     No current facility-administered medications for this visit.   Facility-Administered Medications Ordered in Other Visits  Medication Dose Route Frequency Provider Last Rate Last Admin   fluorouracil (ADRUCIL) 5,700 mg in sodium chloride 0.9 % 136 mL chemo infusion  2,400 mg/m2 (Treatment Plan Recorded) Intravenous 1 day or 1 dose Truitt Merle, MD   Infusion Verify at 03/03/22 1613    PHYSICAL EXAMINATION: ECOG PERFORMANCE STATUS: 0 - Asymptomatic  There were no vitals filed for this visit. Wt Readings from Last 3 Encounters:  03/03/22 246 lb 4 oz (111.7 kg)  02/18/22 247 lb (112 kg)  02/16/22 248 lb 3.2 oz (112.6 kg)     GENERAL:alert, no distress and comfortable SKIN: skin color normal, no rashes or significant lesions EYES: normal, Conjunctiva are pink and non-injected, sclera clear  NEURO: alert & oriented x 3 with fluent speech  LABORATORY DATA:  I have reviewed the data as listed    Latest Ref Rng & Units 03/03/2022   10:38 AM 02/09/2022    7:31 AM 01/12/2021    4:08 AM  CBC  WBC 4.0 - 10.5  K/uL 6.6  6.2  11.0   Hemoglobin 13.0 - 17.0 g/dL 16.5  17.1  11.5   Hematocrit 39.0 - 52.0 % 47.9  52.2  36.3   Platelets 150 - 400 K/uL 182  193  242         Latest Ref Rng & Units 03/03/2022   10:38 AM 02/09/2022    7:31 AM 01/12/2021    4:08 AM  CMP  Glucose 70 - 99 mg/dL 109  106  135   BUN 8 - 23 mg/dL 15  12  13    Creatinine 0.61 - 1.24 mg/dL 0.91  0.92  0.89   Sodium 135 - 145 mmol/L 138  138  138   Potassium 3.5 - 5.1 mmol/L 3.9  4.3  4.1   Chloride 98 - 111 mmol/L 105  108  102   CO2 22 - 32 mmol/L 28  19  28    Calcium 8.9 - 10.3 mg/dL 9.2  8.6  8.7   Total Protein 6.5 - 8.1 g/dL 7.0     Total Bilirubin 0.3 - 1.2 mg/dL 0.6     Alkaline Phos 38 - 126 U/L 61     AST 15 - 41 U/L 20     ALT 0 - 44 U/L 21         RADIOGRAPHIC STUDIES: I have personally reviewed the radiological images as listed and agreed with the findings in the report. No results found.    No orders of the defined types were placed in this encounter.  All questions were answered. The patient knows to call the clinic with any problems, questions or concerns. No barriers to learning was detected. The total time spent in the appointment was 30 minutes.     Truitt Merle, MD 03/03/2022   I, Wilburn Mylar, am acting as scribe for Truitt Merle, MD.   I have reviewed the above documentation for accuracy and completeness, and I agree with the above.

## 2022-03-03 NOTE — Patient Instructions (Signed)
[facility]  Discharge Instructions: Thank you for choosing [facility] to provide your oncology and hematology care.   If you have a lab appointment with the [facility], please go directly to the [facility] and check in at the registration area.   Wear comfortable clothing and clothing appropriate for easy access to any Portacath or PICC line.   We strive to give you quality time with your provider. You may need to reschedule your appointment if you arrive late (15 or more minutes).  Arriving late affects you and other patients whose appointments are after yours.  Also, if you miss three or more appointments without notifying the office, you may be dismissed from the clinic at the provider's discretion.      For prescription refill requests, have your pharmacy contact our office and allow 72 hours for refills to be completed.    Today you received the following chemotherapy and/or immunotherapy agents: Oxaliplatin, leucovorin, fluorourcil      To help prevent nausea and vomiting after your treatment, we encourage you to take your nausea medication as directed.  BELOW ARE SYMPTOMS THAT SHOULD BE REPORTED IMMEDIATELY: *FEVER GREATER THAN 100.4 F (38 C) OR HIGHER *CHILLS OR SWEATING *NAUSEA AND VOMITING THAT IS NOT CONTROLLED WITH YOUR NAUSEA MEDICATION *UNUSUAL SHORTNESS OF BREATH *UNUSUAL BRUISING OR BLEEDING *URINARY PROBLEMS (pain or burning when urinating, or frequent urination) *BOWEL PROBLEMS (unusual diarrhea, constipation, pain near the anus) TENDERNESS IN MOUTH AND THROAT WITH OR WITHOUT PRESENCE OF ULCERS (sore throat, sores in mouth, or a toothache) UNUSUAL RASH, SWELLING OR PAIN  UNUSUAL VAGINAL DISCHARGE OR ITCHING   Items with * indicate a potential emergency and should be followed up as soon as possible or go to the Emergency Department if any problems should occur.  Please show the CHEMOTHERAPY ALERT CARD or IMMUNOTHERAPY  ALERT CARD at check-in to the Emergency Department and triage nurse.  Should you have questions after your visit or need to cancel or reschedule your appointment, please contact [facility]  Dept: [phone]  and follow the prompts.  Office hours are 8:00 a.m. to 4:30 p.m. Monday - Friday. Please note that voicemails left after 4:00 p.m. may not be returned until the following business day.  We are closed weekends and major holidays. You have access to a nurse at all times for urgent questions. Please call the main number to the clinic Dept: [phone] and follow the prompts.   For any non-urgent questions, you may also contact your provider using MyChart. We now offer e-Visits for anyone 76 and older to request care online for non-urgent symptoms. For details visit [email or web address].   Also download the MyChart app! Go to the app store, search "MyChart", open the app, select [facility], and log in with your MyChart username and password.  Masks are optional in the cancer centers. If you would like for your care team to wear a mask while they are taking care of you, please let them know. You may have one support person who is at least 77 years old accompany you for your appointments. Oxaliplatin Injection What is this medication? OXALIPLATIN (ox AL i PLA tin) treats some types of cancer. It works by slowing down the growth of cancer cells. This medicine may be used for other purposes; ask your health care provider or pharmacist if you have questions. COMMON BRAND NAME(S): Eloxatin What should I tell my care team before I take  this medication? They need to know if you have any of these conditions: Heart disease History of irregular heartbeat or rhythm Liver disease Low blood cell levels (white cells, red cells, and platelets) Lung or breathing disease, such as asthma Take medications that treat or prevent blood clots Tingling of the fingers, toes, or  other nerve disorder An unusual or allergic reaction to oxaliplatin, other medications, foods, dyes, or preservatives If you or your partner are pregnant or trying to get pregnant Breast-feeding How should I use this medication? This medication is injected into a vein. It is given by your care team in a hospital or clinic setting. Talk to your care team about the use of this medication in children. Special care may be needed. Overdosage: If you think you have taken too much of this medicine contact a poison control center or emergency room at once. NOTE: This medicine is only for you. Do not share this medicine with others. What if I miss a dose? Keep appointments for follow-up doses. It is important not to miss a dose. Call your care team if you are unable to keep an appointment. What may interact with this medication? Do not take this medication with any of the following: Cisapride Dronedarone Pimozide Thioridazine This medication may also interact with the following: Aspirin and aspirin-like medications Certain medications that treat or prevent blood clots, such as warfarin, apixaban, dabigatran, and rivaroxaban Cisplatin Cyclosporine Diuretics Medications for infection, such as acyclovir, adefovir, amphotericin B, bacitracin, cidofovir, foscarnet, ganciclovir, gentamicin, pentamidine, vancomycin NSAIDs, medications for pain and inflammation, such as ibuprofen or naproxen Other medications that cause heart rhythm changes Pamidronate Zoledronic acid This list may not describe all possible interactions. Give your health care provider a list of all the medicines, herbs, non-prescription drugs, or dietary supplements you use. Also tell them if you smoke, drink alcohol, or use illegal drugs. Some items may interact with your medicine. What should I watch for while using this medication? Your condition will be monitored carefully while you are receiving this medication. You may need blood  work while taking this medication. This medication may make you feel generally unwell. This is not uncommon as chemotherapy can affect healthy cells as well as cancer cells. Report any side effects. Continue your course of treatment even though you feel ill unless your care team tells you to stop. This medication may increase your risk of getting an infection. Call your care team for advice if you get a fever, chills, sore throat, or other symptoms of a cold or flu. Do not treat yourself. Try to avoid being around people who are sick. Avoid taking medications that contain aspirin, acetaminophen, ibuprofen, naproxen, or ketoprofen unless instructed by your care team. These medications may hide a fever. Be careful brushing or flossing your teeth or using a toothpick because you may get an infection or bleed more easily. If you have any dental work done, tell your dentist you are receiving this medication. This medication can make you more sensitive to cold. Do not drink cold drinks or use ice. Cover exposed skin before coming in contact with cold temperatures or cold objects. When out in cold weather wear warm clothing and cover your mouth and nose to warm the air that goes into your lungs. Tell your care team if you get sensitive to the cold. Talk to your care team if you or your partner are pregnant or think either of you might be pregnant. This medication can cause serious birth defects if  taken during pregnancy and for 9 months after the last dose. A negative pregnancy test is required before starting this medication. A reliable form of contraception is recommended while taking this medication and for 9 months after the last dose. Talk to your care team about effective forms of contraception. Do not father a child while taking this medication and for 6 months after the last dose. Use a condom while having sex during this time period. Do not breastfeed while taking this medication and for 3 months after the  last dose. This medication may cause infertility. Talk to your care team if you are concerned about your fertility. What side effects may I notice from receiving this medication? Side effects that you should report to your care team as soon as possible: Allergic reactions--skin rash, itching, hives, swelling of the face, lips, tongue, or throat Bleeding--bloody or black, tar-like stools, vomiting blood or brown material that looks like coffee grounds, red or dark brown urine, small red or purple spots on skin, unusual bruising or bleeding Dry cough, shortness of breath or trouble breathing Heart rhythm changes--fast or irregular heartbeat, dizziness, feeling faint or lightheaded, chest pain, trouble breathing Infection--fever, chills, cough, sore throat, wounds that don't heal, pain or trouble when passing urine, general feeling of discomfort or being unwell Liver injury--right upper belly pain, loss of appetite, nausea, light-colored stool, dark yellow or brown urine, yellowing skin or eyes, unusual weakness or fatigue Low red blood cell level--unusual weakness or fatigue, dizziness, headache, trouble breathing Muscle injury--unusual weakness or fatigue, muscle pain, dark yellow or brown urine, decrease in amount of urine Pain, tingling, or numbness in the hands or feet Sudden and severe headache, confusion, change in vision, seizures, which may be signs of posterior reversible encephalopathy syndrome (PRES) Unusual bruising or bleeding Side effects that usually do not require medical attention (report to your care team if they continue or are bothersome): Diarrhea Nausea Pain, redness, or swelling with sores inside the mouth or throat Unusual weakness or fatigue Vomiting This list may not describe all possible side effects. Call your doctor for medical advice about side effects. You may report side effects to FDA at 1-800-FDA-1088. Where should I keep my medication? This medication is given  in a hospital or clinic. It will not be stored at home. NOTE: This sheet is a summary. It may not cover all possible information. If you have questions about this medicine, talk to your doctor, pharmacist, or health care provider.  2023 Elsevier/Gold Standard (2021-10-09 00:00:00) Leucovorin Injection What is this medication? LEUCOVORIN (loo koe VOR in) prevents side effects from certain medications, such as methotrexate. It works by increasing folate levels. This helps protect healthy cells in your body. It may also be used to treat anemia caused by low levels of folate. It can also be used with fluorouracil, a type of chemotherapy, to treat colorectal cancer. It works by increasing the effects of fluorouracil in the body. This medicine may be used for other purposes; ask your health care provider or pharmacist if you have questions. What should I tell my care team before I take this medication? They need to know if you have any of these conditions: Anemia from low levels of vitamin B12 in the blood An unusual or allergic reaction to leucovorin, folic acid, other medications, foods, dyes, or preservatives Pregnant or trying to get pregnant Breastfeeding How should I use this medication? This medication is injected into a vein or a muscle. It is given by your  care team in a hospital or clinic setting. Talk to your care team about the use of this medication in children. Special care may be needed. Overdosage: If you think you have taken too much of this medicine contact a poison control center or emergency room at once. NOTE: This medicine is only for you. Do not share this medicine with others. What if I miss a dose? Keep appointments for follow-up doses. It is important not to miss your dose. Call your care team if you are unable to keep an appointment. What may interact with this medication? Capecitabine Fluorouracil Phenobarbital Phenytoin Primidone Trimethoprim;sulfamethoxazole This  list may not describe all possible interactions. Give your health care provider a list of all the medicines, herbs, non-prescription drugs, or dietary supplements you use. Also tell them if you smoke, drink alcohol, or use illegal drugs. Some items may interact with your medicine. What should I watch for while using this medication? Your condition will be monitored carefully while you are receiving this medication. This medication may increase the side effects of 5-fluorouracil. Tell your care team if you have diarrhea or mouth sores that do not get better or that get worse. What side effects may I notice from receiving this medication? Side effects that you should report to your care team as soon as possible: Allergic reactions--skin rash, itching, hives, swelling of the face, lips, tongue, or throat This list may not describe all possible side effects. Call your doctor for medical advice about side effects. You may report side effects to FDA at 1-800-FDA-1088. Where should I keep my medication? This medication is given in a hospital or clinic. It will not be stored at home. NOTE: This sheet is a summary. It may not cover all possible information. If you have questions about this medicine, talk to your doctor, pharmacist, or health care provider.  2023 Elsevier/Gold Standard (2021-10-23 00:00:00) Fluorouracil Injection What is this medication? FLUOROURACIL (flure oh YOOR a sil) treats some types of cancer. It works by slowing down the growth of cancer cells. This medicine may be used for other purposes; ask your health care provider or pharmacist if you have questions. COMMON BRAND NAME(S): Adrucil What should I tell my care team before I take this medication? They need to know if you have any of these conditions: Blood disorders Dihydropyrimidine dehydrogenase (DPD) deficiency Infection, such as chickenpox, cold sores, herpes Kidney disease Liver disease Poor nutrition Recent or ongoing  radiation therapy An unusual or allergic reaction to fluorouracil, other medications, foods, dyes, or preservatives If you or your partner are pregnant or trying to get pregnant Breast-feeding How should I use this medication? This medication is injected into a vein. It is administered by your care team in a hospital or clinic setting. Talk to your care team about the use of this medication in children. Special care may be needed. Overdosage: If you think you have taken too much of this medicine contact a poison control center or emergency room at once. NOTE: This medicine is only for you. Do not share this medicine with others. What if I miss a dose? Keep appointments for follow-up doses. It is important not to miss your dose. Call your care team if you are unable to keep an appointment. What may interact with this medication? Do not take this medication with any of the following: Live virus vaccines This medication may also interact with the following: Medications that treat or prevent blood clots, such as warfarin, enoxaparin, dalteparin This list may  not describe all possible interactions. Give your health care provider a list of all the medicines, herbs, non-prescription drugs, or dietary supplements you use. Also tell them if you smoke, drink alcohol, or use illegal drugs. Some items may interact with your medicine. What should I watch for while using this medication? Your condition will be monitored carefully while you are receiving this medication. This medication may make you feel generally unwell. This is not uncommon as chemotherapy can affect healthy cells as well as cancer cells. Report any side effects. Continue your course of treatment even though you feel ill unless your care team tells you to stop. In some cases, you may be given additional medications to help with side effects. Follow all directions for their use. This medication may increase your risk of getting an infection.  Call your care team for advice if you get a fever, chills, sore throat, or other symptoms of a cold or flu. Do not treat yourself. Try to avoid being around people who are sick. This medication may increase your risk to bruise or bleed. Call your care team if you notice any unusual bleeding. Be careful brushing or flossing your teeth or using a toothpick because you may get an infection or bleed more easily. If you have any dental work done, tell your dentist you are receiving this medication. Avoid taking medications that contain aspirin, acetaminophen, ibuprofen, naproxen, or ketoprofen unless instructed by your care team. These medications may hide a fever. Do not treat diarrhea with over the counter products. Contact your care team if you have diarrhea that lasts more than 2 days or if it is severe and watery. This medication can make you more sensitive to the sun. Keep out of the sun. If you cannot avoid being in the sun, wear protective clothing and sunscreen. Do not use sun lamps, tanning beds, or tanning booths. Talk to your care team if you or your partner wish to become pregnant or think you might be pregnant. This medication can cause serious birth defects if taken during pregnancy and for 3 months after the last dose. A reliable form of contraception is recommended while taking this medication and for 3 months after the last dose. Talk to your care team about effective forms of contraception. Do not father a child while taking this medication and for 3 months after the last dose. Use a condom while having sex during this time period. Do not breastfeed while taking this medication. This medication may cause infertility. Talk to your care team if you are concerned about your fertility. What side effects may I notice from receiving this medication? Side effects that you should report to your care team as soon as possible: Allergic reactions--skin rash, itching, hives, swelling of the face, lips,  tongue, or throat Heart attack--pain or tightness in the chest, shoulders, arms, or jaw, nausea, shortness of breath, cold or clammy skin, feeling faint or lightheaded Heart failure--shortness of breath, swelling of the ankles, feet, or hands, sudden weight gain, unusual weakness or fatigue Heart rhythm changes--fast or irregular heartbeat, dizziness, feeling faint or lightheaded, chest pain, trouble breathing High ammonia level--unusual weakness or fatigue, confusion, loss of appetite, nausea, vomiting, seizures Infection--fever, chills, cough, sore throat, wounds that don't heal, pain or trouble when passing urine, general feeling of discomfort or being unwell Low red blood cell level--unusual weakness or fatigue, dizziness, headache, trouble breathing Pain, tingling, or numbness in the hands or feet, muscle weakness, change in vision, confusion or trouble speaking,  loss of balance or coordination, trouble walking, seizures Redness, swelling, and blistering of the skin over hands and feet Severe or prolonged diarrhea Unusual bruising or bleeding Side effects that usually do not require medical attention (report to your care team if they continue or are bothersome): Dry skin Headache Increased tears Nausea Pain, redness, or swelling with sores inside the mouth or throat Sensitivity to light Vomiting This list may not describe all possible side effects. Call your doctor for medical advice about side effects. You may report side effects to FDA at 1-800-FDA-1088. Where should I keep my medication? This medication is given in a hospital or clinic. It will not be stored at home. NOTE: This sheet is a summary. It may not cover all possible information. If you have questions about this medicine, talk to your doctor, pharmacist, or health care provider.  2023 Elsevier/Gold Standard (2021-10-20 00:00:00)

## 2022-03-04 ENCOUNTER — Telehealth: Payer: Self-pay

## 2022-03-04 NOTE — Telephone Encounter (Signed)
Mr. Witucki states that he is fine. He is eating, drinking, and urinating well. He drank a cold drink yesterday and had the cold sensitivity.  He then went to room temperature beverages and did fine. Encouraged him to continue room temperature food and drinks. He knows to call the office at [phone] if he has any questions or concerns.

## 2022-03-04 NOTE — Telephone Encounter (Signed)
-----   Message from Dionne Ano, RN sent at 03/03/2022  1:40 PM EDT ----- Regarding: Follo wup: 1st time Oxali, Leucovorin, fluorourcil Dr Burr Medico 03/04/2022

## 2022-03-05 ENCOUNTER — Inpatient Hospital Stay: Payer: Medicare HMO

## 2022-03-05 ENCOUNTER — Other Ambulatory Visit: Payer: Self-pay

## 2022-03-05 VITALS — BP 168/63 | HR 50 | Temp 98.0°F | Resp 20

## 2022-03-05 DIAGNOSIS — K769 Liver disease, unspecified: Secondary | ICD-10-CM | POA: Diagnosis not present

## 2022-03-05 DIAGNOSIS — C189 Malignant neoplasm of colon, unspecified: Secondary | ICD-10-CM

## 2022-03-05 DIAGNOSIS — C184 Malignant neoplasm of transverse colon: Secondary | ICD-10-CM | POA: Diagnosis not present

## 2022-03-05 DIAGNOSIS — I10 Essential (primary) hypertension: Secondary | ICD-10-CM | POA: Diagnosis not present

## 2022-03-05 DIAGNOSIS — Z5111 Encounter for antineoplastic chemotherapy: Secondary | ICD-10-CM | POA: Diagnosis not present

## 2022-03-05 DIAGNOSIS — C7801 Secondary malignant neoplasm of right lung: Secondary | ICD-10-CM | POA: Diagnosis not present

## 2022-03-05 MED ORDER — SODIUM CHLORIDE 0.9% FLUSH
10.0000 mL | INTRAVENOUS | Status: DC | PRN
  Administered 2022-03-05: 10 mL

## 2022-03-05 MED ORDER — HEPARIN SOD (PORK) LOCK FLUSH 100 UNIT/ML IV SOLN
500.0000 [IU] | Freq: Once | INTRAVENOUS | Status: AC | PRN
  Administered 2022-03-05: 500 [IU]

## 2022-03-10 ENCOUNTER — Inpatient Hospital Stay: Payer: Medicare HMO | Admitting: Hematology

## 2022-03-10 ENCOUNTER — Inpatient Hospital Stay: Payer: Medicare HMO

## 2022-03-10 ENCOUNTER — Ambulatory Visit: Payer: Medicare HMO | Admitting: Hematology

## 2022-03-10 ENCOUNTER — Other Ambulatory Visit: Payer: Medicare HMO

## 2022-03-10 ENCOUNTER — Ambulatory Visit: Payer: Medicare HMO

## 2022-03-12 ENCOUNTER — Inpatient Hospital Stay: Payer: Medicare HMO

## 2022-03-15 ENCOUNTER — Encounter: Payer: Self-pay | Admitting: Hematology

## 2022-03-15 ENCOUNTER — Telehealth: Payer: Self-pay

## 2022-03-15 ENCOUNTER — Other Ambulatory Visit: Payer: Self-pay

## 2022-03-15 DIAGNOSIS — R197 Diarrhea, unspecified: Secondary | ICD-10-CM

## 2022-03-15 DIAGNOSIS — N401 Enlarged prostate with lower urinary tract symptoms: Secondary | ICD-10-CM | POA: Diagnosis not present

## 2022-03-15 DIAGNOSIS — R351 Nocturia: Secondary | ICD-10-CM | POA: Diagnosis not present

## 2022-03-15 DIAGNOSIS — E860 Dehydration: Secondary | ICD-10-CM

## 2022-03-15 NOTE — Telephone Encounter (Signed)
Spoke with pt and spouse via telephone regarding episodes of diarrhea.  Pt stated he's been having 10 episodes of diarrhea since Saturday.  Pt stated he's taken Imodium 1 to 2 times since he's had diarrhea.  Pt also stated he's been eating ice cream, drinking grape juice, Gatorade, drinking 1 to 2 bottles of water, V8, and GingerAle.  Educated pt on how much and when to take the Imodium.  Also, educated pt on modifying his diet during episodes of diarrhea like not eating and drinking milkshakes or drinking grape juice etc.  Pt verbalized understanding of education.  Asked pt if he would like to come into [facility] on 03/16/2022 for hydration but pt stated he would like to just come in on Wednesday with his regular appointment for chemotherapy.  Spoke with Dr. Burr Medico regarding pt's request.  Verbal order for NS 1L over 1hr prior to chemotherapy.  Pt's chemotherapy maybe held if pt's symptoms have not resolved per Dr. Burr Medico.  Notified [facility] that the pt will be coming in on Wednesday at his regular infusion appt.  CN Nikki Wiley for infusion notified of hydration add-on to pt's regular scheduled appt on Wednesday.  Nikki adjusted the pt's appt length and this RN entered verbal order from Dr. Burr Medico into the Supportive Therapy Plan for 03/17/2022.

## 2022-03-15 NOTE — Telephone Encounter (Signed)
LVM for pt regarding On-call telephone advice record.  Pt called in on 03/13/2022 stating that he has had diarrhea since day 4 of his tx.  Pt told oncall nurse that he was having 4-5 stools per day.  It is dark green to light black in color.  Pt also stated he is urinating normal.  Pt did not indicate what he was taking for the diarrhea.  Called pt to get further assessment and to see if pt could be seen by [facility] this week for hydration and etc.  Pt did not answer but asked pt to give Dr. Ernestina Penna office a call regarding his diarrhea.  Awaiting pt's return call but notified [facility] in the meantime of the pt's complaint.

## 2022-03-16 ENCOUNTER — Other Ambulatory Visit: Payer: Self-pay

## 2022-03-16 MED FILL — Dexamethasone Sodium Phosphate Inj 100 MG/10ML: INTRAMUSCULAR | Qty: 1 | Status: AC

## 2022-03-17 ENCOUNTER — Inpatient Hospital Stay: Payer: Medicare HMO

## 2022-03-17 ENCOUNTER — Other Ambulatory Visit: Payer: Self-pay

## 2022-03-17 ENCOUNTER — Encounter: Payer: Self-pay | Admitting: Hematology

## 2022-03-17 ENCOUNTER — Inpatient Hospital Stay (HOSPITAL_BASED_OUTPATIENT_CLINIC_OR_DEPARTMENT_OTHER): Payer: Medicare HMO | Admitting: Hematology

## 2022-03-17 VITALS — BP 164/79 | HR 59 | Temp 97.7°F | Resp 19 | Wt 251.5 lb

## 2022-03-17 DIAGNOSIS — I10 Essential (primary) hypertension: Secondary | ICD-10-CM | POA: Diagnosis not present

## 2022-03-17 DIAGNOSIS — C189 Malignant neoplasm of colon, unspecified: Secondary | ICD-10-CM | POA: Diagnosis not present

## 2022-03-17 DIAGNOSIS — K769 Liver disease, unspecified: Secondary | ICD-10-CM | POA: Diagnosis not present

## 2022-03-17 DIAGNOSIS — C184 Malignant neoplasm of transverse colon: Secondary | ICD-10-CM | POA: Diagnosis not present

## 2022-03-17 DIAGNOSIS — E860 Dehydration: Secondary | ICD-10-CM

## 2022-03-17 DIAGNOSIS — C7801 Secondary malignant neoplasm of right lung: Secondary | ICD-10-CM | POA: Diagnosis not present

## 2022-03-17 DIAGNOSIS — Z5111 Encounter for antineoplastic chemotherapy: Secondary | ICD-10-CM | POA: Diagnosis not present

## 2022-03-17 DIAGNOSIS — C7800 Secondary malignant neoplasm of unspecified lung: Secondary | ICD-10-CM | POA: Diagnosis not present

## 2022-03-17 LAB — CBC WITH DIFFERENTIAL (CANCER CENTER ONLY)
Abs Immature Granulocytes: 0.01 10*3/uL (ref 0.00–0.07)
Basophils Absolute: 0.1 10*3/uL (ref 0.0–0.1)
Basophils Relative: 1 %
Eosinophils Absolute: 0.1 10*3/uL (ref 0.0–0.5)
Eosinophils Relative: 2 %
HCT: 44.1 % (ref 39.0–52.0)
Hemoglobin: 15.2 g/dL (ref 13.0–17.0)
Immature Granulocytes: 0 %
Lymphocytes Relative: 27 %
Lymphs Abs: 1.5 10*3/uL (ref 0.7–4.0)
MCH: 31.9 pg (ref 26.0–34.0)
MCHC: 34.5 g/dL (ref 30.0–36.0)
MCV: 92.5 fL (ref 80.0–100.0)
Monocytes Absolute: 0.8 10*3/uL (ref 0.1–1.0)
Monocytes Relative: 13 %
Neutro Abs: 3.2 10*3/uL (ref 1.7–7.7)
Neutrophils Relative %: 57 %
Platelet Count: 165 10*3/uL (ref 150–400)
RBC: 4.77 MIL/uL (ref 4.22–5.81)
RDW: 14.3 % (ref 11.5–15.5)
WBC Count: 5.7 10*3/uL (ref 4.0–10.5)
nRBC: 0 % (ref 0.0–0.2)

## 2022-03-17 LAB — CMP (CANCER CENTER ONLY)
ALT: 26 U/L (ref 0–44)
AST: 21 U/L (ref 15–41)
Albumin: 3.5 g/dL (ref 3.5–5.0)
Alkaline Phosphatase: 51 U/L (ref 38–126)
Anion gap: 5 (ref 5–15)
BUN: 10 mg/dL (ref 8–23)
CO2: 29 mmol/L (ref 22–32)
Calcium: 8.5 mg/dL — ABNORMAL LOW (ref 8.9–10.3)
Chloride: 108 mmol/L (ref 98–111)
Creatinine: 0.99 mg/dL (ref 0.61–1.24)
GFR, Estimated: >60 mL/min (ref >60)
Glucose, Bld: 106 mg/dL — ABNORMAL HIGH (ref 70–99)
Potassium: 3.6 mmol/L (ref 3.5–5.1)
Sodium: 142 mmol/L (ref 135–145)
Total Bilirubin: 0.5 mg/dL (ref 0.3–1.2)
Total Protein: 6 g/dL — ABNORMAL LOW (ref 6.5–8.1)

## 2022-03-17 MED ORDER — DEXTROSE 5 % IV SOLN: Freq: Once | INTRAVENOUS | Status: AC

## 2022-03-17 MED ORDER — SODIUM CHLORIDE 0.9 % IV SOLN
5.0000 mg/kg | Freq: Once | INTRAVENOUS | Status: AC
  Administered 2022-03-17: 600 mg via INTRAVENOUS
  Filled 2022-03-17: qty 16

## 2022-03-17 MED ORDER — OXALIPLATIN CHEMO INJECTION 100 MG/20ML
85.0000 mg/m2 | Freq: Once | INTRAVENOUS | Status: AC
  Administered 2022-03-17: 200 mg via INTRAVENOUS
  Filled 2022-03-17: qty 40

## 2022-03-17 MED ORDER — PALONOSETRON HCL INJECTION 0.25 MG/5ML
0.2500 mg | Freq: Once | INTRAVENOUS | Status: AC
  Administered 2022-03-17: 0.25 mg via INTRAVENOUS
  Filled 2022-03-17: qty 5

## 2022-03-17 MED ORDER — LEUCOVORIN CALCIUM INJECTION 350 MG
400.0000 mg/m2 | Freq: Once | INTRAVENOUS | Status: AC
  Administered 2022-03-17: 952 mg via INTRAVENOUS
  Filled 2022-03-17: qty 47.6

## 2022-03-17 MED ORDER — SODIUM CHLORIDE 0.9% FLUSH
10.0000 mL | Freq: Once | INTRAVENOUS | Status: AC | PRN
  Administered 2022-03-17: 10 mL

## 2022-03-17 MED ORDER — SODIUM CHLORIDE 0.9 % IV SOLN
2400.0000 mg/m2 | INTRAVENOUS | Status: DC
  Administered 2022-03-17: 5700 mg via INTRAVENOUS
  Filled 2022-03-17: qty 114

## 2022-03-17 MED ORDER — SODIUM CHLORIDE 0.9 % IV SOLN
10.0000 mg | Freq: Once | INTRAVENOUS | Status: AC
  Administered 2022-03-17: 10 mg via INTRAVENOUS
  Filled 2022-03-17: qty 10

## 2022-03-17 MED ORDER — SODIUM CHLORIDE 0.9 % IV SOLN: Freq: Once | INTRAVENOUS | Status: AC

## 2022-03-17 NOTE — Progress Notes (Signed)
[facility]   [phone] [phone]   Clinic Follow up Note   Patient Care Team: Angelina Sheriff, MD as PCP - General (Family Medicine) Berniece Salines, DO as PCP - Cardiology (Cardiology) Truitt Merle, MD as Attending Physician (Hematology and Oncology)  Date of Service:  03/17/2022  CHIEF COMPLAINT: f/u of metastatic colon cancer  CURRENT THERAPY:  FOLFOX, q14d, starting 03/03/22 -Bevacizumab added with C2 (9/20)  ASSESSMENT & PLAN:  Thomas Knight is a 76 y.o. male with   1. Right side colon cancer stage II (pT3, N0), metastatic disease to the lung found in July 2023, MSS, KRAS/NRAS/BRAF wild type  -found on screening colonoscopy. S/p right hemicolectomy 01/09/21  -surveillance CT CAP on 01/25/22 showed: new right lung nodules, up to 11 mm; new nodularity/lymph nodes in the central mesentery. PET scan 02/04/22 showed no hypermetabolic disease. -CEA on 01/29/22 WNL at 1.97 -bronchoscopy on 02/09/22 by Dr. Valeta Harms showed adenocarcinoma in RUL, with morphologic features consistent with metastasis from a colorectal primary. -[facility] showed MSS, low tumor burden, KRAS/NRAS/BRAF wild-type, no other targetable mutations. The benefit of EGFR inhibitor is much less in right-sided colon cancer, so I will not give it as first line but may consider in subsequent lines. -he started FOLFOX on 03/03/22. He tolerated well overall with diarrhea for several days. We reviewed management of diarrhea  -plan to add bevacizumab today. Lab reviewed, CBC is WNL, CMP shows low calcium and protein.     PLAN: -proceed with C2 FOLFOX with beva today as scheduled -lab, flush, f/u, and C3 FOLFOX and beva in 2 weeks    No problem-specific Assessment & Plan notes found for this encounter.   SUMMARY OF ONCOLOGIC HISTORY: Oncology History  Colon cancer ([place])  10/2020 Procedure   Colonoscopy-Dr. Benson Norway     11/06/2020 Imaging   CT CHEST ABDOMEN PELVIS W CONTRAST    IMPRESSION: 1. 4.6 cm partially circumferential apple-core type neoplastic process involving the mid transverse colon. No findings for locoregional adenopathy or distant metastatic disease. 2. Enlarged prostate gland with median lobe hypertrophy impressing on the base of the bladder. 3. 16 mm left thyroid nodule. Recommend follow-up thyroid ultrasound examination.   01/09/2021 Initial Diagnosis   Colon cancer ([place])   01/09/2021 Pathology Results   B. COLON, RIGHT, RESECTION:  -  Adenocarcinoma, moderately differentiated, 3.0 cm  -  No carcinoma identified in seventeen lymph nodes (0/17)  -  Margins uninvolved by carcinoma  -  Tubular adenoma (x2)  -  Benign appendix  -  See oncology table and comment below   Margin Status for Invasive Carcinoma: All margins negative for  invasive carcinoma       Distance from Invasive Carcinoma to Radial (Circumferential)       Margin Status for Non-Invasive Tumor: All margins negative for  high-grade dysplasia / intramucosal carcinoma and low-grade dysplasia  Regional Lymph Nodes:       Number of Lymph Nodes with Tumor: 0       Number of Lymph Nodes Examined: 17  Tumor Deposits: 0  Distant Metastasis:       Distant Site(s) Involved: Not applicable  Pathologic Stage Classification (pTNM, AJCC 8th Edition): pT3, pN0   MMR Stable    01/09/2021 Cancer Staging   Staging form: Colon and Rectum, AJCC 8th Edition - Pathologic stage from 01/09/2021: Stage IIA (pT3, pN0, cM0) - Signed by Truitt Merle, MD on 02/12/2022 Total positive nodes: 0 Histologic grading system: 4 grade system Histologic  grade (G): G2 Residual tumor (R): R0 - None   05/2021 Tumor Marker   Patient's tumor was tested for the following markers: CEA. Results of the tumor marker test revealed 2.1.   12/11/2021 Procedure   Colonoscopy-Dr. Benson Norway  There was evidence of a prior functional end-to-end ileo-colonic anastomosis in the transverse colon. This was patent and was  characterized by healthy appearing mucosa. The anastomosis was traversed. Findings: Scattered small and large-mouthed diverticula were found in the sigmoid colon. - Patent functional end-to-end ileo-colonic anastomosis, characterized by healthy appearing mucosa. - Diverticulosis in the sigmoid colon. - No specimens collected.   01/25/2022 Imaging   CT CHEST ABDOMEN PELVIS W CONTRAST   IMPRESSION: 1. New right-sided pulmonary nodules measure up to 11 mm, nonspecific but concerning for metastatic disease. Consider further evaluation with nuclear medicine PET/CT. 2. New nodularity/lymph nodes in the central mesentery, concerning for nodal disease involvement. 3. Slightly increased size of prominent retroperitoneal lymph nodes, nonspecific but also somewhat concerning for nodal disease involvement. 4. Surgical change of partial colectomy without evidence of local recurrence. 5. Stable prominent mediastinal lymph nodes, nonspecific but stability is reassuring. Attention on follow-up imaging suggested. 6. Stable prominent right external iliac lymph node, nonspecific but stability is reassuring. Attention on follow-up imaging suggested. 7. Stable hypodense 8 mm hepatic lesion technically too small to accurately characterize but stability is reassuring. Attention on follow-up imaging suggested. 8. 1.9 cm incidental left thyroid nodule. Recommend thyroid US. Reference: J Am Coll Radiol. 2015 Feb;12(2): 143-50 9.  Aortic Atherosclerosis (ICD10-I70.0).   02/09/2022 Procedure   Bronchoscopy under the care of Dr. Valeta Harms    02/09/2022 Pathology Results   FINAL MICROSCOPIC DIAGNOSIS:   A. LUNG, RUL, FINE NEEDLE ASPIRATION  BIOPSY FORCEPS:  Adenocarcinoma with morphologic features consistent with metastasis from a colorectal primary.  Please see comment.   Comment: The following immunostains are performed with appropriate controls :  TTF-1: Negative .  Napsin A: Negative .  CK7: Negative.   CK20: Positive.  CDX2: Positive .   The above immuno histochemical profile is supportive of the rendered diagnosis.    Colon cancer metastasized to lung [facility])  02/09/2022 Pathology Results   FINAL MICROSCOPIC DIAGNOSIS:   A. LUNG, RUL, FINE NEEDLE ASPIRATION  BIOPSY FORCEPS:  Adenocarcinoma with morphologic features consistent with metastasis from a colorectal primary.  Please see comment.   Comment: The following immunostains are performed with appropriate controls :  TTF-1: Negative .  Napsin A: Negative .  CK7: Negative.  CK20: Positive.  CDX2: Positive .   The above immuno histochemical profile is supportive of the rendered diagnosis.    02/12/2022 Initial Diagnosis   Colon cancer metastasized to lung [facility])   03/03/2022 -  Chemotherapy   Patient is on Treatment Plan : COLORECTAL FOLFOX + Bevacizumab q14d        INTERVAL HISTORY:  Emre Stock is here for a follow up of metastatic colon cancer. He was last seen by me on 03/03/22. He was seen in the infusion area. He reports he had diarrhea for several days after infusion. He endorses taking imodium but "not as much as I probably should have."   All other systems were reviewed with the patient and are negative.  MEDICAL HISTORY:  Past Medical History:  Diagnosis Date   Bilateral leg edema 08/24/2019   Cancer ([place])    skin ca, colon   Chronic cough    Chronic pain of left knee 12/01/2015   COVID-19    06/2019  Dysrhythmia    afib  was cardioverted   Essential hypertension 08/24/2019   Mixed hyperlipidemia 08/24/2019   Obesity (BMI 30-39.9) 08/24/2019   Persistent atrial fibrillation ([place]) 08/24/2019    SURGICAL HISTORY: Past Surgical History:  Procedure Laterality Date   ACHILLES TENDON REPAIR Left 2012   Ruptured   BRONCHIAL BIOPSY  02/09/2022   Procedure: BRONCHIAL BIOPSIES;  Surgeon: Garner Nash, DO;  Location: [facility];  Service: Pulmonary;;   BRONCHIAL NEEDLE ASPIRATION BIOPSY  02/09/2022    Procedure: BRONCHIAL NEEDLE ASPIRATION BIOPSIES;  Surgeon: Garner Nash, DO;  Location: [facility];  Service: Pulmonary;;   COLONOSCOPY WITH PROPOFOL N/A 12/11/2021   Procedure: COLONOSCOPY WITH PROPOFOL;  Surgeon: Carol Ada, MD;  Location: [facility];  Service: Gastroenterology;  Laterality: N/A;   HERNIA REPAIR     umbilical hernia   IR IMAGING GUIDED PORT INSERTION  02/18/2022   SKIN SURGERY Left    Basal cell carcinoma on left forearm   XI ROBOTIC ASSISTED LOWER ANTERIOR RESECTION N/A 01/09/2021   Procedure: XI ROBOTIC ASSISTED RIGHT HEMI COLECTOMY, LYSIS OF ADHESIONS, SMALL BOWEL RESECTION, INTRAOPERATIVE ASSESMENT OF PERFUSION, PARTIAL OMENTECTOMY;  Surgeon: Ileana Roup, MD;  Location: [facility] ORS;  Service: General;  Laterality: N/A;    I have reviewed the social history and family history with the patient and they are unchanged from previous note.  ALLERGIES:  is allergic to tramadol.  MEDICATIONS:  Current Outpatient Medications  Medication Sig Dispense Refill   acetaminophen (TYLENOL) 500 MG tablet Take 500-1,000 mg by mouth every 6 (six) hours as needed (pain.).     Ascorbic Acid (VITAMIN C WITH ROSE HIPS) 500 MG tablet Take 500 mg by mouth every evening.     Cholecalciferol (D3-1000) 25 MCG (1000 UT) capsule Take 1,000 Units by mouth every evening.     ELIQUIS 5 MG TABS tablet Take 1 tablet (5 mg total) by mouth 2 (two) times daily. 180 tablet 0   lidocaine-prilocaine (EMLA) cream Apply 1 Application topically as needed. 30 g 2   lidocaine-prilocaine (EMLA) cream Apply to affected area once 30 g 3   metoprolol tartrate (LOPRESSOR) 25 MG tablet Take 1 tablet (25 mg total) by mouth 2 (two) times daily. 180 tablet 3   Misc Natural Products (JOINT SUPPORT PO) Take 1 tablet by mouth daily. Instaflex Advanced Joint Support     nitroGLYCERIN (NITROSTAT) 0.4 MG SL tablet Place 1 tablet (0.4 mg total) under the tongue every 5 (five) minutes as needed. 25 tablet 7    ondansetron (ZOFRAN) 8 MG tablet Take 1 tablet every 8 hours as needed for nausea/vomiting starting on 3 days after chemotherapy 30 tablet 2   ondansetron (ZOFRAN) 8 MG tablet Take 1 tablet (8 mg total) by mouth every 8 (eight) hours as needed for nausea or vomiting. Start on the third day after chemotherapy. 30 tablet 1   prochlorperazine (COMPAZINE) 10 MG tablet Take 1 tablet (10 mg total) by mouth every 6 (six) hours as needed. 30 tablet 2   prochlorperazine (COMPAZINE) 10 MG tablet Take 1 tablet (10 mg total) by mouth every 6 (six) hours as needed for nausea or vomiting. 30 tablet 1   vitamin B-12 (CYANOCOBALAMIN) 1000 MCG tablet Take 1,000 mcg by mouth every evening.     Zinc 50 MG CAPS Take 50 mg by mouth every evening.     No current facility-administered medications for this visit.    PHYSICAL EXAMINATION: ECOG PERFORMANCE STATUS: 1 - Symptomatic but completely  ambulatory  There were no vitals filed for this visit. Wt Readings from Last 3 Encounters:  03/17/22 251 lb 8 oz (114.1 kg)  03/03/22 246 lb 4 oz (111.7 kg)  02/18/22 247 lb (112 kg)     GENERAL:alert, no distress and comfortable SKIN: skin color normal, no rashes or significant lesions EYES: normal, Conjunctiva are pink and non-injected, sclera clear  NEURO: alert & oriented x 3 with fluent speech  LABORATORY DATA:  I have reviewed the data as listed    Latest Ref Rng & Units 03/17/2022    9:20 AM 03/03/2022   10:38 AM 02/09/2022    7:31 AM  CBC  WBC 4.0 - 10.5 K/uL 5.7  6.6  6.2   Hemoglobin 13.0 - 17.0 g/dL 15.2  16.5  17.1   Hematocrit 39.0 - 52.0 % 44.1  47.9  52.2   Platelets 150 - 400 K/uL 165  182  193         Latest Ref Rng & Units 03/17/2022    9:20 AM 03/03/2022   10:38 AM 02/09/2022    7:31 AM  CMP  Glucose 70 - 99 mg/dL 106  109  106   BUN 8 - 23 mg/dL 10  15  12    Creatinine 0.61 - 1.24 mg/dL 0.99  0.91  0.92   Sodium 135 - 145 mmol/L 142  138  138   Potassium 3.5 - 5.1 mmol/L 3.6  3.9  4.3    Chloride 98 - 111 mmol/L 108  105  108   CO2 22 - 32 mmol/L 29  28  19    Calcium 8.9 - 10.3 mg/dL 8.5  9.2  8.6   Total Protein 6.5 - 8.1 g/dL 6.0  7.0    Total Bilirubin 0.3 - 1.2 mg/dL 0.5  0.6    Alkaline Phos 38 - 126 U/L 51  61    AST 15 - 41 U/L 21  20    ALT 0 - 44 U/L 26  21        RADIOGRAPHIC STUDIES: I have personally reviewed the radiological images as listed and agreed with the findings in the report. No results found.    No orders of the defined types were placed in this encounter.  All questions were answered. The patient knows to call the clinic with any problems, questions or concerns. No barriers to learning was detected. The total time spent in the appointment was 30 minutes.     Truitt Merle, MD 03/17/2022   I, Wilburn Mylar, am acting as scribe for Truitt Merle, MD.   I have reviewed the above documentation for accuracy and completeness, and I agree with the above.

## 2022-03-17 NOTE — Progress Notes (Signed)
Ok per Dr Burr Medico to treat with bp 164/79

## 2022-03-19 ENCOUNTER — Inpatient Hospital Stay: Payer: Medicare HMO

## 2022-03-19 VITALS — BP 159/89 | HR 54 | Temp 98.3°F | Resp 20

## 2022-03-19 DIAGNOSIS — Z5111 Encounter for antineoplastic chemotherapy: Secondary | ICD-10-CM | POA: Diagnosis not present

## 2022-03-19 DIAGNOSIS — C7801 Secondary malignant neoplasm of right lung: Secondary | ICD-10-CM | POA: Diagnosis not present

## 2022-03-19 DIAGNOSIS — C189 Malignant neoplasm of colon, unspecified: Secondary | ICD-10-CM

## 2022-03-19 DIAGNOSIS — K769 Liver disease, unspecified: Secondary | ICD-10-CM | POA: Diagnosis not present

## 2022-03-19 DIAGNOSIS — I10 Essential (primary) hypertension: Secondary | ICD-10-CM | POA: Diagnosis not present

## 2022-03-19 DIAGNOSIS — C184 Malignant neoplasm of transverse colon: Secondary | ICD-10-CM | POA: Diagnosis not present

## 2022-03-19 MED ORDER — SODIUM CHLORIDE 0.9% FLUSH
10.0000 mL | INTRAVENOUS | Status: DC | PRN
  Administered 2022-03-19: 10 mL

## 2022-03-19 MED ORDER — HEPARIN SOD (PORK) LOCK FLUSH 100 UNIT/ML IV SOLN
500.0000 [IU] | Freq: Once | INTRAVENOUS | Status: AC | PRN
  Administered 2022-03-19: 500 [IU]

## 2022-03-27 DIAGNOSIS — E785 Hyperlipidemia, unspecified: Secondary | ICD-10-CM | POA: Diagnosis not present

## 2022-03-27 DIAGNOSIS — I10 Essential (primary) hypertension: Secondary | ICD-10-CM | POA: Diagnosis not present

## 2022-03-29 DIAGNOSIS — Z85038 Personal history of other malignant neoplasm of large intestine: Secondary | ICD-10-CM | POA: Diagnosis not present

## 2022-03-30 MED FILL — Dexamethasone Sodium Phosphate Inj 100 MG/10ML: INTRAMUSCULAR | Qty: 1 | Status: AC

## 2022-03-31 ENCOUNTER — Inpatient Hospital Stay (HOSPITAL_BASED_OUTPATIENT_CLINIC_OR_DEPARTMENT_OTHER): Payer: Medicare HMO | Admitting: Hematology

## 2022-03-31 ENCOUNTER — Encounter: Payer: Self-pay | Admitting: Hematology

## 2022-03-31 ENCOUNTER — Inpatient Hospital Stay: Payer: Medicare HMO

## 2022-03-31 ENCOUNTER — Inpatient Hospital Stay: Payer: Medicare HMO | Attending: Physician Assistant

## 2022-03-31 VITALS — BP 179/92 | HR 62 | Temp 98.0°F | Resp 15 | Ht 71.0 in | Wt 249.5 lb

## 2022-03-31 VITALS — BP 170/83

## 2022-03-31 DIAGNOSIS — Z5112 Encounter for antineoplastic immunotherapy: Secondary | ICD-10-CM | POA: Insufficient documentation

## 2022-03-31 DIAGNOSIS — C7800 Secondary malignant neoplasm of unspecified lung: Secondary | ICD-10-CM

## 2022-03-31 DIAGNOSIS — R197 Diarrhea, unspecified: Secondary | ICD-10-CM | POA: Insufficient documentation

## 2022-03-31 DIAGNOSIS — E041 Nontoxic single thyroid nodule: Secondary | ICD-10-CM | POA: Insufficient documentation

## 2022-03-31 DIAGNOSIS — Z5111 Encounter for antineoplastic chemotherapy: Secondary | ICD-10-CM | POA: Insufficient documentation

## 2022-03-31 DIAGNOSIS — Z452 Encounter for adjustment and management of vascular access device: Secondary | ICD-10-CM | POA: Diagnosis not present

## 2022-03-31 DIAGNOSIS — C184 Malignant neoplasm of transverse colon: Secondary | ICD-10-CM | POA: Diagnosis not present

## 2022-03-31 DIAGNOSIS — E860 Dehydration: Secondary | ICD-10-CM

## 2022-03-31 DIAGNOSIS — C189 Malignant neoplasm of colon, unspecified: Secondary | ICD-10-CM

## 2022-03-31 DIAGNOSIS — R030 Elevated blood-pressure reading, without diagnosis of hypertension: Secondary | ICD-10-CM | POA: Diagnosis not present

## 2022-03-31 LAB — CMP (CANCER CENTER ONLY)
ALT: 27 U/L (ref 0–44)
AST: 21 U/L (ref 15–41)
Albumin: 3.6 g/dL (ref 3.5–5.0)
Alkaline Phosphatase: 51 U/L (ref 38–126)
Anion gap: 4 — ABNORMAL LOW (ref 5–15)
BUN: 11 mg/dL (ref 8–23)
CO2: 28 mmol/L (ref 22–32)
Calcium: 8.4 mg/dL — ABNORMAL LOW (ref 8.9–10.3)
Chloride: 109 mmol/L (ref 98–111)
Creatinine: 0.86 mg/dL (ref 0.61–1.24)
GFR, Estimated: >60 mL/min (ref >60)
Glucose, Bld: 111 mg/dL — ABNORMAL HIGH (ref 70–99)
Potassium: 3.7 mmol/L (ref 3.5–5.1)
Sodium: 141 mmol/L (ref 135–145)
Total Bilirubin: 0.7 mg/dL (ref 0.3–1.2)
Total Protein: 5.8 g/dL — ABNORMAL LOW (ref 6.5–8.1)

## 2022-03-31 LAB — CBC WITH DIFFERENTIAL (CANCER CENTER ONLY)
Abs Immature Granulocytes: 0.01 10*3/uL (ref 0.00–0.07)
Basophils Absolute: 0.1 10*3/uL (ref 0.0–0.1)
Basophils Relative: 1 %
Eosinophils Absolute: 0.1 10*3/uL (ref 0.0–0.5)
Eosinophils Relative: 2 %
HCT: 43.5 % (ref 39.0–52.0)
Hemoglobin: 15 g/dL (ref 13.0–17.0)
Immature Granulocytes: 0 %
Lymphocytes Relative: 23 %
Lymphs Abs: 1.3 10*3/uL (ref 0.7–4.0)
MCH: 32 pg (ref 26.0–34.0)
MCHC: 34.5 g/dL (ref 30.0–36.0)
MCV: 92.8 fL (ref 80.0–100.0)
Monocytes Absolute: 0.8 10*3/uL (ref 0.1–1.0)
Monocytes Relative: 14 %
Neutro Abs: 3.4 10*3/uL (ref 1.7–7.7)
Neutrophils Relative %: 60 %
Platelet Count: 122 10*3/uL — ABNORMAL LOW (ref 150–400)
RBC: 4.69 MIL/uL (ref 4.22–5.81)
RDW: 15.2 % (ref 11.5–15.5)
WBC Count: 5.6 10*3/uL (ref 4.0–10.5)
nRBC: 0 % (ref 0.0–0.2)

## 2022-03-31 LAB — TOTAL PROTEIN, URINE DIPSTICK: Protein, ur: 30 mg/dL — AB

## 2022-03-31 MED ORDER — SODIUM CHLORIDE 0.9% FLUSH: 10.0000 mL | INTRAVENOUS | Status: DC | PRN

## 2022-03-31 MED ORDER — SODIUM CHLORIDE 0.9% FLUSH
10.0000 mL | Freq: Once | INTRAVENOUS | Status: AC | PRN
  Administered 2022-03-31: 10 mL

## 2022-03-31 MED ORDER — SODIUM CHLORIDE 0.9 % IV SOLN: Freq: Once | INTRAVENOUS | Status: AC

## 2022-03-31 MED ORDER — SODIUM CHLORIDE 0.9 % IV SOLN
10.0000 mg | Freq: Once | INTRAVENOUS | Status: AC
  Administered 2022-03-31: 10 mg via INTRAVENOUS
  Filled 2022-03-31: qty 10

## 2022-03-31 MED ORDER — SODIUM CHLORIDE 0.9 % IV SOLN
2400.0000 mg/m2 | INTRAVENOUS | Status: DC
  Administered 2022-03-31: 5700 mg via INTRAVENOUS
  Filled 2022-03-31: qty 114

## 2022-03-31 MED ORDER — OXALIPLATIN CHEMO INJECTION 100 MG/20ML
85.0000 mg/m2 | Freq: Once | INTRAVENOUS | Status: AC
  Administered 2022-03-31: 200 mg via INTRAVENOUS
  Filled 2022-03-31: qty 40

## 2022-03-31 MED ORDER — PALONOSETRON HCL INJECTION 0.25 MG/5ML
0.2500 mg | Freq: Once | INTRAVENOUS | Status: AC
  Administered 2022-03-31: 0.25 mg via INTRAVENOUS
  Filled 2022-03-31: qty 5

## 2022-03-31 MED ORDER — LEUCOVORIN CALCIUM INJECTION 350 MG
400.0000 mg/m2 | Freq: Once | INTRAVENOUS | Status: AC
  Administered 2022-03-31: 952 mg via INTRAVENOUS
  Filled 2022-03-31: qty 47.6

## 2022-03-31 MED ORDER — DEXTROSE 5 % IV SOLN: Freq: Once | INTRAVENOUS | Status: AC

## 2022-03-31 MED ORDER — AMLODIPINE BESYLATE 5 MG PO TABS: 5.0000 mg | ORAL_TABLET | Freq: Every day | ORAL | 0 refills | Status: DC

## 2022-03-31 MED ORDER — SODIUM CHLORIDE 0.9 % IV SOLN
5.0000 mg/kg | Freq: Once | INTRAVENOUS | Status: AC
  Administered 2022-03-31: 600 mg via INTRAVENOUS
  Filled 2022-03-31: qty 16

## 2022-03-31 NOTE — Patient Instructions (Signed)
[facility]  Discharge Instructions: Thank you for choosing [facility] to provide your oncology and hematology care.   If you have a lab appointment with the [facility], please go directly to the [facility] and check in at the registration area.   Wear comfortable clothing and clothing appropriate for easy access to any Portacath or PICC line.   We strive to give you quality time with your provider. You may need to reschedule your appointment if you arrive late (15 or more minutes).  Arriving late affects you and other patients whose appointments are after yours.  Also, if you miss three or more appointments without notifying the office, you may be dismissed from the clinic at the provider's discretion.      For prescription refill requests, have your pharmacy contact our office and allow 72 hours for refills to be completed.    Today you received the following chemotherapy and/or immunotherapy agents: Bevacizumab, Oxaliplatin, leucovorin, fluorourcil      To help prevent nausea and vomiting after your treatment, we encourage you to take your nausea medication as directed.  BELOW ARE SYMPTOMS THAT SHOULD BE REPORTED IMMEDIATELY: *FEVER GREATER THAN 100.4 F (38 C) OR HIGHER *CHILLS OR SWEATING *NAUSEA AND VOMITING THAT IS NOT CONTROLLED WITH YOUR NAUSEA MEDICATION *UNUSUAL SHORTNESS OF BREATH *UNUSUAL BRUISING OR BLEEDING *URINARY PROBLEMS (pain or burning when urinating, or frequent urination) *BOWEL PROBLEMS (unusual diarrhea, constipation, pain near the anus) TENDERNESS IN MOUTH AND THROAT WITH OR WITHOUT PRESENCE OF ULCERS (sore throat, sores in mouth, or a toothache) UNUSUAL RASH, SWELLING OR PAIN  UNUSUAL VAGINAL DISCHARGE OR ITCHING   Items with * indicate a potential emergency and should be followed up as soon as possible or go to the Emergency Department if any problems should occur.  Please show the CHEMOTHERAPY ALERT CARD or  IMMUNOTHERAPY ALERT CARD at check-in to the Emergency Department and triage nurse.  Should you have questions after your visit or need to cancel or reschedule your appointment, please contact [facility]  Dept: [phone]  and follow the prompts.  Office hours are 8:00 a.m. to 4:30 p.m. Monday - Friday. Please note that voicemails left after 4:00 p.m. may not be returned until the following business day.  We are closed weekends and major holidays. You have access to a nurse at all times for urgent questions. Please call the main number to the clinic Dept: [phone] and follow the prompts.   For any non-urgent questions, you may also contact your provider using MyChart. We now offer e-Visits for anyone 82 and older to request care online for non-urgent symptoms. For details visit [email or web address].   Also download the MyChart app! Go to the app store, search "MyChart", open the app, select [facility], and log in with your MyChart username and password.  Masks are optional in the cancer centers. If you would like for your care team to wear a mask while they are taking care of you, please let them know. You may have one support person who is at least 77 years old accompany you for your appointments.

## 2022-03-31 NOTE — Progress Notes (Signed)
.  Per Dr. Ernestina Penna note ok to proceed with Cycle 3 today:   " Bevacizumab added with C2 (9/20). -he continues to tolerate well aside from diarrhea (discussed below). Lab reviewed, plt count down to 122k, urine protein 30. Adequate to proceed with C3 today.   2. Chemo toxicities: Diarrhea, elevated BP -s/p C2, he reports 5 days of diarrhea with 6-8 BM per day. He endorses using imodium, but I encouraged him to use more. He can use up to 8 a day. We also discussed option of changing the 5FU pump to oral Xeloda. -he has rising BP secondary to beva. BP 179/92 today (03/31/22). I will call in amlodipine 5mg  for him."

## 2022-03-31 NOTE — Progress Notes (Signed)
[place]   [phone] Fax:([phone]   Clinic Follow up Note   Patient Care Team: Angelina Sheriff, MD as PCP - General (Family Medicine) Berniece Salines, DO as PCP - Cardiology (Cardiology) Truitt Merle, MD as Attending Physician (Hematology and Oncology)  Date of Service:  03/31/2022  CHIEF COMPLAINT: f/u of metastatic colon cancer  CURRENT THERAPY:  FOLFOX, q14d, starting 03/03/22 -Bevacizumab added with C2 (9/20)  ASSESSMENT & PLAN:  Thomas Knight is a 76 y.o. male with   1. Right side colon cancer stage II (pT3, N0), metastatic disease to the lung found in July 2023, MSS, KRAS/NRAS/BRAF wild type  -found on screening colonoscopy. S/p right hemicolectomy 01/09/21  -surveillance CT CAP on 01/25/22 showed: new right lung nodules, up to 11 mm; new nodularity/lymph nodes in the central mesentery. PET scan 02/04/22 showed no hypermetabolic disease. -CEA on 01/29/22 WNL at 1.97 -bronchoscopy on 02/09/22 by Dr. Valeta Harms showed adenocarcinoma in RUL, with morphologic features consistent with metastasis from a colorectal primary. -[facility] showed MSS, low tumor burden, KRAS/NRAS/BRAF wild-type, no other targetable mutations. The benefit of EGFR inhibitor is much less in right-sided colon cancer, so I will not give it as first line but may consider in subsequent lines. -he started FOLFOX on 03/03/22. He tolerated well overall with diarrhea for several days. Bevacizumab added with C2 (9/20). -he continues to tolerate well aside from diarrhea (discussed below). Lab reviewed, plt count down to 122k, urine protein 30. Adequate to proceed with C3 today.  2. Chemo toxicities: Diarrhea, elevated BP -s/p C2, he reports 5 days of diarrhea with 6-8 BM per day. He endorses using imodium, but I encouraged him to use more. He can use up to 8 a day. We also discussed option of changing the 5FU pump to oral Xeloda if he has persistent diarrhea. -he has rising BP secondary to beva.  BP 179/92 today (03/31/22). I will call in amlodipine 8m for him.    PLAN: -proceed with C3 FOLFOX with beva today as scheduled -I called in amlodipine for his HTN -lab, flush, f/u, and C4 FOLFOX and beva in 2 weeks  -He will increase Imodium for diarrhea after chemo, we also discussed option of change 5-FU to Xeloda if he has persistent diarrhea.   No problem-specific Assessment & Plan notes found for this encounter.   SUMMARY OF ONCOLOGIC HISTORY: Oncology History  [place]  [place]   Colonoscopy-Dr. HBenson Norway    11/06/2020 Imaging   CT CHEST ABDOMEN PELVIS W CONTRAST   IMPRESSION: 1. 4.6 cm partially circumferential apple-core type neoplastic process involving the mid transverse colon. No findings for locoregional adenopathy or distant metastatic disease. 2. Enlarged prostate gland with median lobe hypertrophy impressing on the base of the bladder. 3. 16 mm left thyroid nodule. Recommend follow-up thyroid ultrasound examination.   01/09/2021 Initial Diagnosis   [place]   01/09/2021 Pathology Results   B. COLON, RIGHT, RESECTION:  -  Adenocarcinoma, moderately differentiated, 3.0 cm  -  No carcinoma identified in seventeen lymph nodes (0/17)  -  Margins uninvolved by carcinoma  -  Tubular adenoma (x2)  -  Benign appendix  -  See oncology table and comment below   Margin Status for Invasive Carcinoma: All margins negative for  invasive carcinoma       Distance from Invasive Carcinoma to Radial (Circumferential)       Margin Status for Non-Invasive Tumor: All margins negative for  high-grade dysplasia /  intramucosal carcinoma and low-grade dysplasia  Regional Lymph Nodes:       Number of Lymph Nodes with Tumor: 0       Number of Lymph Nodes Examined: 17  Tumor Deposits: 0  Distant Metastasis:       Distant Site(s) Involved: Not applicable  Pathologic Stage Classification (pTNM, AJCC 8th Edition): pT3, pN0   MMR Stable    01/09/2021  Cancer Staging   Staging form: Colon and Rectum, AJCC 8th Edition - Pathologic stage from 01/09/2021: Stage IIA (pT3, pN0, cM0) - Signed by Truitt Merle, MD on 02/12/2022 Total positive nodes: 0 Histologic grading system: 4 grade system Histologic grade (G): G2 Residual tumor (R): R0 - None   05/2021 Tumor Marker   Patient's tumor was tested for the following markers: CEA. Results of the tumor marker test revealed 2.1.   12/11/2021 Procedure   Colonoscopy-Dr. Benson Norway  There was evidence of a prior functional end-to-end ileo-colonic anastomosis in the transverse colon. This was patent and was characterized by healthy appearing mucosa. The anastomosis was traversed. Findings: Scattered small and large-mouthed diverticula were found in the sigmoid colon. - Patent functional end-to-end ileo-colonic anastomosis, characterized by healthy appearing mucosa. - Diverticulosis in the sigmoid colon. - No specimens collected.   01/25/2022 Imaging   CT CHEST ABDOMEN PELVIS W CONTRAST   IMPRESSION: 1. New right-sided pulmonary nodules measure up to 11 mm, nonspecific but concerning for metastatic disease. Consider further evaluation with nuclear medicine PET/CT. 2. New nodularity/lymph nodes in the central mesentery, concerning for nodal disease involvement. 3. Slightly increased size of prominent retroperitoneal lymph nodes, nonspecific but also somewhat concerning for nodal disease involvement. 4. Surgical change of partial colectomy without evidence of local recurrence. 5. Stable prominent mediastinal lymph nodes, nonspecific but stability is reassuring. Attention on follow-up imaging suggested. 6. Stable prominent right external iliac lymph node, nonspecific but stability is reassuring. Attention on follow-up imaging suggested. 7. Stable hypodense 8 mm hepatic lesion technically too small to accurately characterize but stability is reassuring. Attention on follow-up imaging suggested. 8. 1.9  cm incidental left thyroid nodule. Recommend thyroid US. Reference: J Am Coll Radiol. 2015 Feb;12(2): 143-50 9.  Aortic Atherosclerosis (ICD10-I70.0).   02/09/2022 Procedure   Bronchoscopy under the care of Dr. Valeta Harms    02/09/2022 Pathology Results   FINAL MICROSCOPIC DIAGNOSIS:   A. LUNG, RUL, FINE NEEDLE ASPIRATION  BIOPSY FORCEPS:  Adenocarcinoma with morphologic features consistent with metastasis from a colorectal primary.  Please see comment.   Comment: The following immunostains are performed with appropriate controls :  TTF-1: Negative .  Napsin A: Negative .  CK7: Negative.  CK20: Positive.  CDX2: Positive .   The above immuno histochemical profile is supportive of the rendered diagnosis.    Colon cancer metastasized to lung [facility])  02/09/2022 Pathology Results   FINAL MICROSCOPIC DIAGNOSIS:   A. LUNG, RUL, FINE NEEDLE ASPIRATION  BIOPSY FORCEPS:  Adenocarcinoma with morphologic features consistent with metastasis from a colorectal primary.  Please see comment.   Comment: The following immunostains are performed with appropriate controls :  TTF-1: Negative .  Napsin A: Negative .  CK7: Negative.  CK20: Positive.  CDX2: Positive .   The above immuno histochemical profile is supportive of the rendered diagnosis.    02/12/2022 Initial Diagnosis   Colon cancer metastasized to lung [facility])   03/03/2022 -  Chemotherapy   Patient is on Treatment Plan : COLORECTAL FOLFOX + Bevacizumab q14d        INTERVAL  HISTORY:  Thomas Knight is here for a follow up of metastatic colon cancer. He was last seen by me on 03/17/22. He presents to the clinic alone. He reports he had 5 days of diarrhea following infusion, about 6-8 times a day. He also reports he has started having some SOB at night when laying flat. He explains this had previously resolved   All other systems were reviewed with the patient and are negative.  MEDICAL HISTORY:  Past Medical History:  Diagnosis Date    Bilateral leg edema 08/24/2019   Cancer ([place])    skin ca, colon   Chronic cough    Chronic pain of left knee 12/01/2015   COVID-19    06/2019   Dysrhythmia    afib  was cardioverted   Essential hypertension 08/24/2019   Mixed hyperlipidemia 08/24/2019   Obesity (BMI 30-39.9) 08/24/2019   Persistent atrial fibrillation ([place]) 08/24/2019    SURGICAL HISTORY: Past Surgical History:  Procedure Laterality Date   ACHILLES TENDON REPAIR Left 2012   Ruptured   BRONCHIAL BIOPSY  02/09/2022   Procedure: BRONCHIAL BIOPSIES;  Surgeon: Garner Nash, DO;  Location: [facility];  Service: Pulmonary;;   BRONCHIAL NEEDLE ASPIRATION BIOPSY  02/09/2022   Procedure: BRONCHIAL NEEDLE ASPIRATION BIOPSIES;  Surgeon: Garner Nash, DO;  Location: [facility];  Service: Pulmonary;;   COLONOSCOPY WITH PROPOFOL N/A 12/11/2021   Procedure: COLONOSCOPY WITH PROPOFOL;  Surgeon: Carol Ada, MD;  Location: [facility];  Service: Gastroenterology;  Laterality: N/A;   HERNIA REPAIR     umbilical hernia   IR IMAGING GUIDED PORT INSERTION  02/18/2022   SKIN SURGERY Left    Basal cell carcinoma on left forearm   XI ROBOTIC ASSISTED LOWER ANTERIOR RESECTION N/A 01/09/2021   Procedure: XI ROBOTIC ASSISTED RIGHT HEMI COLECTOMY, LYSIS OF ADHESIONS, SMALL BOWEL RESECTION, INTRAOPERATIVE ASSESMENT OF PERFUSION, PARTIAL OMENTECTOMY;  Surgeon: Ileana Roup, MD;  Location: [facility];  Service: General;  Laterality: N/A;    I have reviewed the social history and family history with the patient and they are unchanged from previous note.  ALLERGIES:  is allergic to tramadol.  MEDICATIONS:  Current Outpatient Medications  Medication Sig Dispense Refill   amLODipine (NORVASC) 5 MG tablet Take 1 tablet (5 mg total) by mouth daily. 30 tablet 0   acetaminophen (TYLENOL) 500 MG tablet Take 500-1,000 mg by mouth every 6 (six) hours as needed (pain.).     Ascorbic Acid (VITAMIN C WITH ROSE HIPS) 500 MG tablet Take 500  mg by mouth every evening.     Cholecalciferol (D3-1000) 25 MCG (1000 UT) capsule Take 1,000 Units by mouth every evening.     ELIQUIS 5 MG TABS tablet Take 1 tablet (5 mg total) by mouth 2 (two) times daily. 180 tablet 0   lidocaine-prilocaine (EMLA) cream Apply 1 Application topically as needed. 30 g 2   lidocaine-prilocaine (EMLA) cream Apply to affected area once 30 g 3   metoprolol tartrate (LOPRESSOR) 25 MG tablet Take 1 tablet (25 mg total) by mouth 2 (two) times daily. 180 tablet 3   Misc Natural Products (JOINT SUPPORT PO) Take 1 tablet by mouth daily. Instaflex Advanced Joint Support     nitroGLYCERIN (NITROSTAT) 0.4 MG SL tablet Place 1 tablet (0.4 mg total) under the tongue every 5 (five) minutes as needed. 25 tablet 7   ondansetron (ZOFRAN) 8 MG tablet Take 1 tablet every 8 hours as needed for nausea/vomiting starting on 3 days after chemotherapy 30  tablet 2   ondansetron (ZOFRAN) 8 MG tablet Take 1 tablet (8 mg total) by mouth every 8 (eight) hours as needed for nausea or vomiting. Start on the third day after chemotherapy. 30 tablet 1   prochlorperazine (COMPAZINE) 10 MG tablet Take 1 tablet (10 mg total) by mouth every 6 (six) hours as needed. 30 tablet 2   prochlorperazine (COMPAZINE) 10 MG tablet Take 1 tablet (10 mg total) by mouth every 6 (six) hours as needed for nausea or vomiting. 30 tablet 1   vitamin B-12 (CYANOCOBALAMIN) 1000 MCG tablet Take 1,000 mcg by mouth every evening.     Zinc 50 MG CAPS Take 50 mg by mouth every evening.     No current facility-administered medications for this visit.   Facility-Administered Medications Ordered in Other Visits  Medication Dose Route Frequency Provider Last Rate Last Admin   fluorouracil (ADRUCIL) 5,700 mg in sodium chloride 0.9 % 136 mL chemo infusion  2,400 mg/m2 (Treatment Plan Recorded) Intravenous 1 day or 1 dose Truitt Merle, MD   Infusion Verify at 03/31/22 1601   sodium chloride flush (NS) 0.9 % injection 10 mL  10 mL  Intracatheter PRN Truitt Merle, MD        PHYSICAL EXAMINATION: ECOG PERFORMANCE STATUS: 1 - Symptomatic but completely ambulatory  Vitals:   03/31/22 1145  BP: (!) 179/92  Pulse: 62  Resp: 15  Temp: 98 F (36.7 C)  SpO2: 98%   Wt Readings from Last 3 Encounters:  03/31/22 249 lb 8 oz (113.2 kg)  03/17/22 251 lb 8 oz (114.1 kg)  03/03/22 246 lb 4 oz (111.7 kg)     GENERAL:alert, no distress and comfortable SKIN: skin color normal, no rashes or significant lesions EYES: normal, Conjunctiva are pink and non-injected, sclera clear  NEURO: alert & oriented x 3 with fluent speech  LABORATORY DATA:  I have reviewed the data as listed    Latest Ref Rng & Units 03/31/2022   11:15 AM 03/17/2022    9:20 AM 03/03/2022   10:38 AM  CBC  WBC 4.0 - 10.5 K/uL 5.6  5.7  6.6   Hemoglobin 13.0 - 17.0 g/dL 15.0  15.2  16.5   Hematocrit 39.0 - 52.0 % 43.5  44.1  47.9   Platelets 150 - 400 K/uL 122  165  182         Latest Ref Rng & Units 03/31/2022   11:15 AM 03/17/2022    9:20 AM 03/03/2022   10:38 AM  CMP  Glucose 70 - 99 mg/dL 111  106  109   BUN 8 - 23 mg/dL 11  10  15    Creatinine 0.61 - 1.24 mg/dL 0.86  0.99  0.91   Sodium 135 - 145 mmol/L 141  142  138   Potassium 3.5 - 5.1 mmol/L 3.7  3.6  3.9   Chloride 98 - 111 mmol/L 109  108  105   CO2 22 - 32 mmol/L 28  29  28    Calcium 8.9 - 10.3 mg/dL 8.4  8.5  9.2   Total Protein 6.5 - 8.1 g/dL 5.8  6.0  7.0   Total Bilirubin 0.3 - 1.2 mg/dL 0.7  0.5  0.6   Alkaline Phos 38 - 126 U/L 51  51  61   AST 15 - 41 U/L 21  21  20    ALT 0 - 44 U/L 27  26  21        RADIOGRAPHIC STUDIES: I have  personally reviewed the radiological images as listed and agreed with the findings in the report. No results found.    Orders Placed This Encounter  Procedures   CBC with Differential ([place] Only)    Standing Status:   Future    Standing Expiration Date:   04/29/2023   CMP ([place] only)    Standing Status:   Future    Standing  Expiration Date:   04/29/2023   All questions were answered. The patient knows to call the clinic with any problems, questions or concerns. No barriers to learning was detected. The total time spent in the appointment was 30 minutes.     Truitt Merle, MD 03/31/2022   I, Wilburn Mylar, am acting as scribe for Truitt Merle, MD.   I have reviewed the above documentation for accuracy and completeness, and I agree with the above.

## 2022-04-02 ENCOUNTER — Inpatient Hospital Stay: Payer: Medicare HMO

## 2022-04-02 ENCOUNTER — Other Ambulatory Visit: Payer: Self-pay

## 2022-04-02 VITALS — BP 158/72 | HR 62 | Temp 98.0°F | Resp 16

## 2022-04-02 DIAGNOSIS — Z5111 Encounter for antineoplastic chemotherapy: Secondary | ICD-10-CM | POA: Diagnosis not present

## 2022-04-02 DIAGNOSIS — Z5112 Encounter for antineoplastic immunotherapy: Secondary | ICD-10-CM | POA: Diagnosis not present

## 2022-04-02 DIAGNOSIS — Z452 Encounter for adjustment and management of vascular access device: Secondary | ICD-10-CM | POA: Diagnosis not present

## 2022-04-02 DIAGNOSIS — C184 Malignant neoplasm of transverse colon: Secondary | ICD-10-CM | POA: Diagnosis not present

## 2022-04-02 DIAGNOSIS — C189 Malignant neoplasm of colon, unspecified: Secondary | ICD-10-CM

## 2022-04-02 DIAGNOSIS — C7800 Secondary malignant neoplasm of unspecified lung: Secondary | ICD-10-CM | POA: Diagnosis not present

## 2022-04-02 DIAGNOSIS — R197 Diarrhea, unspecified: Secondary | ICD-10-CM | POA: Diagnosis not present

## 2022-04-02 DIAGNOSIS — E041 Nontoxic single thyroid nodule: Secondary | ICD-10-CM | POA: Diagnosis not present

## 2022-04-02 DIAGNOSIS — R030 Elevated blood-pressure reading, without diagnosis of hypertension: Secondary | ICD-10-CM | POA: Diagnosis not present

## 2022-04-02 MED ORDER — SODIUM CHLORIDE 0.9% FLUSH
10.0000 mL | INTRAVENOUS | Status: DC | PRN
  Administered 2022-04-02: 10 mL

## 2022-04-02 MED ORDER — HEPARIN SOD (PORK) LOCK FLUSH 100 UNIT/ML IV SOLN
500.0000 [IU] | Freq: Once | INTRAVENOUS | Status: AC | PRN
  Administered 2022-04-02: 500 [IU]

## 2022-04-04 ENCOUNTER — Other Ambulatory Visit: Payer: Self-pay

## 2022-04-11 IMAGING — CT CT CHEST-ABD-PELV W/ CM
2 of 5 series · 14 of 46 positions shown, 16 images · IV contrast (APPLIED)
Comparison: None

CLINICAL DATA: Transverse colon neoplasm found on colonoscopy
today.

EXAM:
CT CHEST, ABDOMEN, AND PELVIS WITH CONTRAST
TECHNIQUE: Multidetector CT imaging of the chest, abdomen and pelvis was
performed following the standard protocol during bolus
administration of intravenous contrast.
CONTRAST:  100mL OMNIPAQUE IOHEXOL 300 MG/ML  SOLN

[Series 2: cap with · axial · 0.94mm/px · z∈[+810,+1370]mm · 11 of 136 slices shown, 13 images]
[im 12/136  soft-tissue]
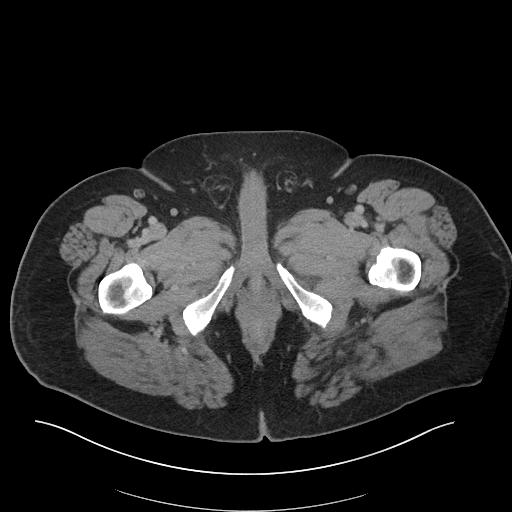
[im 12/136  bone]
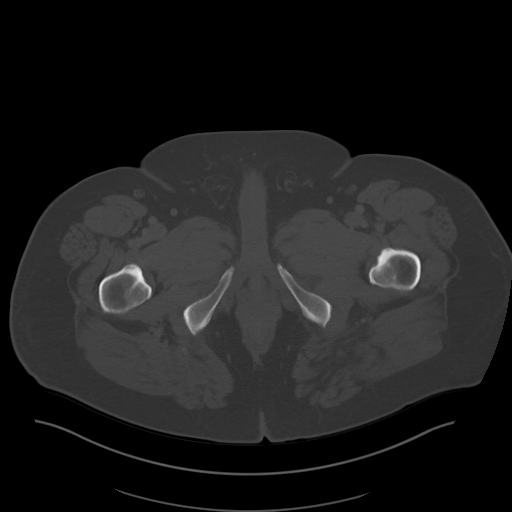
[im 23/136  soft-tissue]
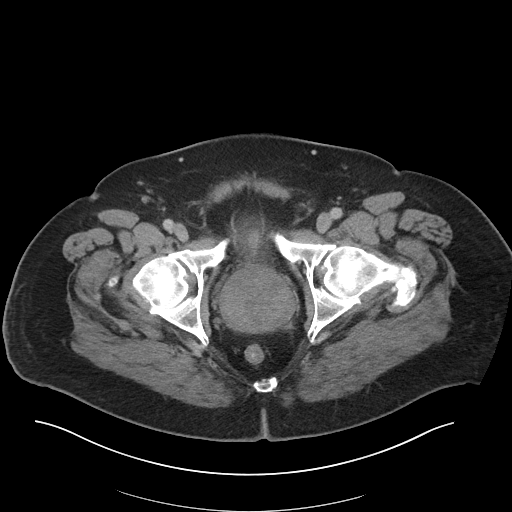
[im 34/136  soft-tissue]
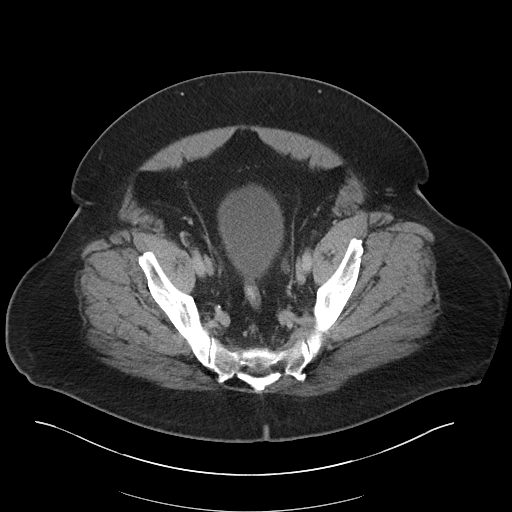
[im 46/136  soft-tissue]
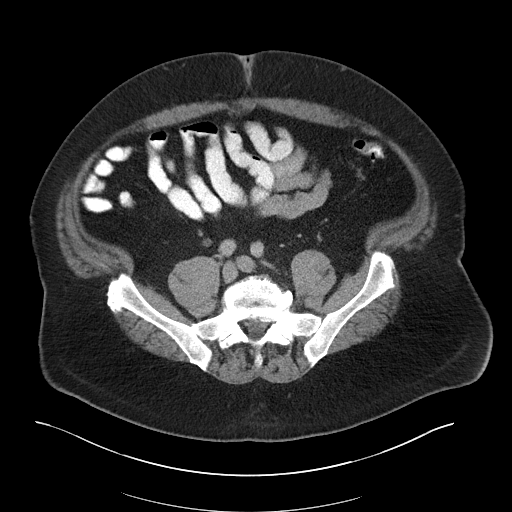
[im 57/136  soft-tissue]
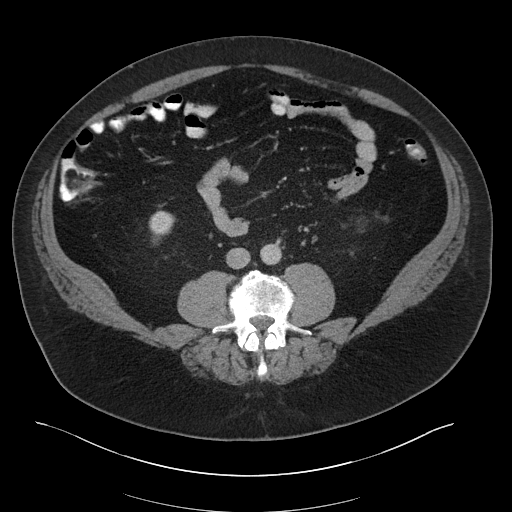
[im 68/136  soft-tissue]
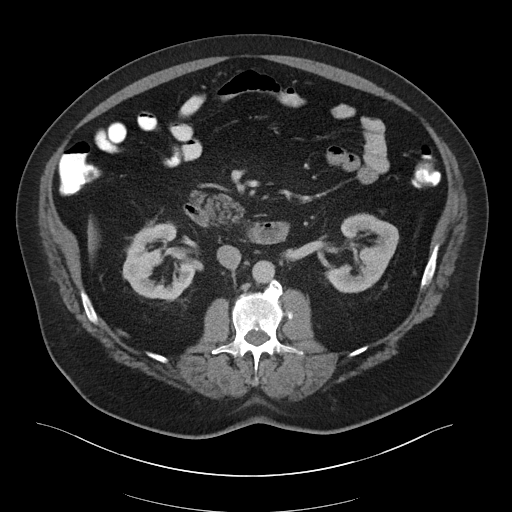
[im 79/136  soft-tissue]
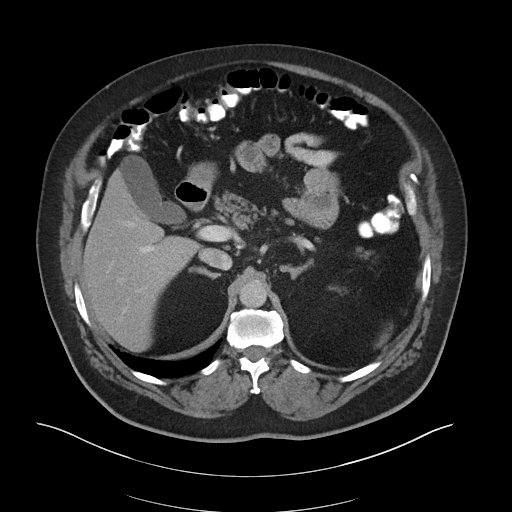
[im 91/136  soft-tissue]
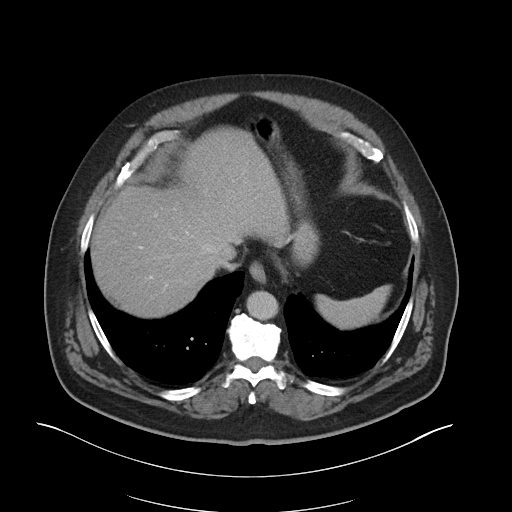
[im 102/136  soft-tissue]
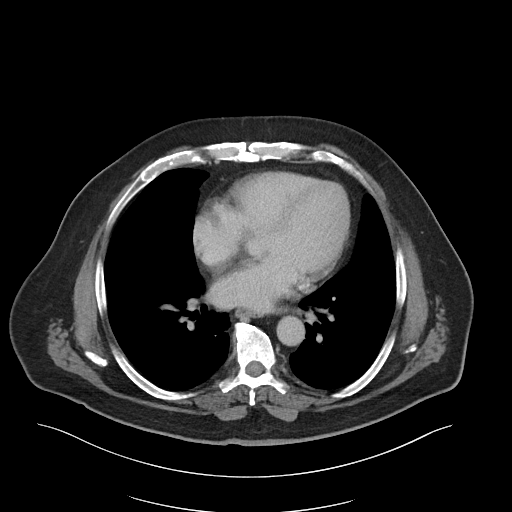
[im 102/136  bone]
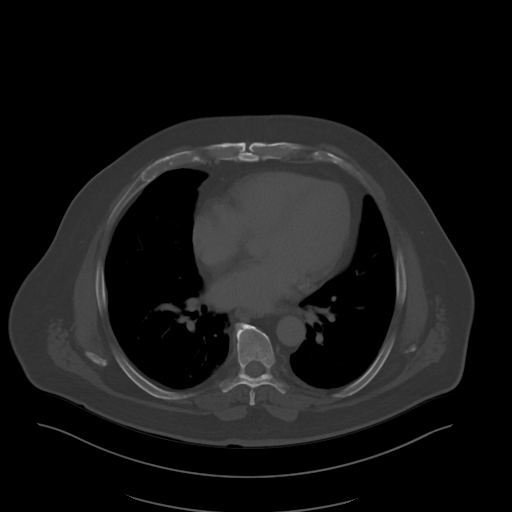
[im 113/136  soft-tissue]
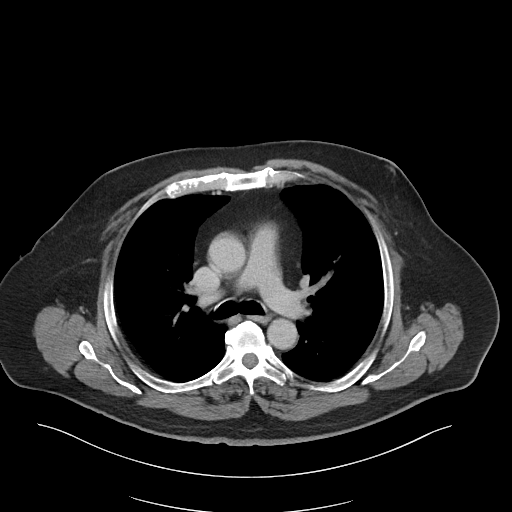
[im 124/136  soft-tissue]
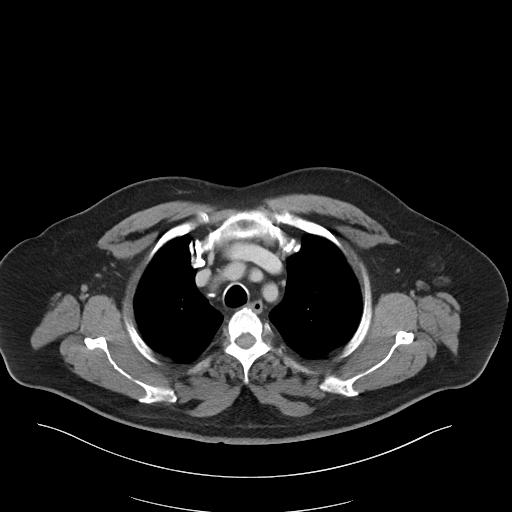

[Series 5: coronal · coronal · 0.96mm/px · 3 of 130 slices shown]
[im 44/130  soft-tissue]
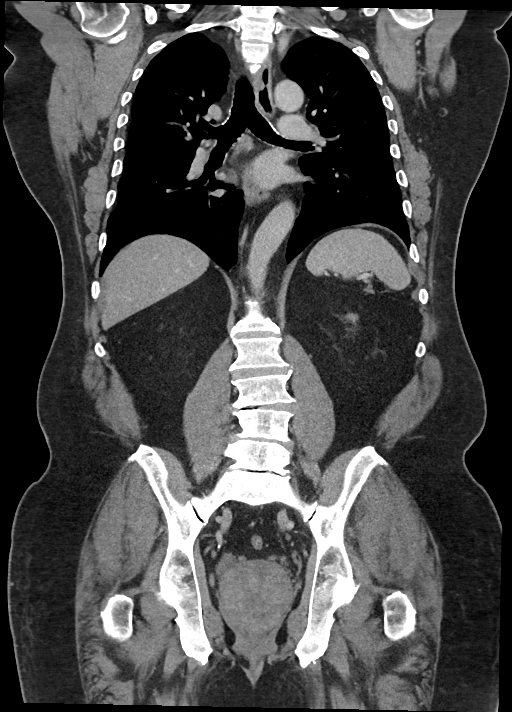
[im 58/130  soft-tissue]
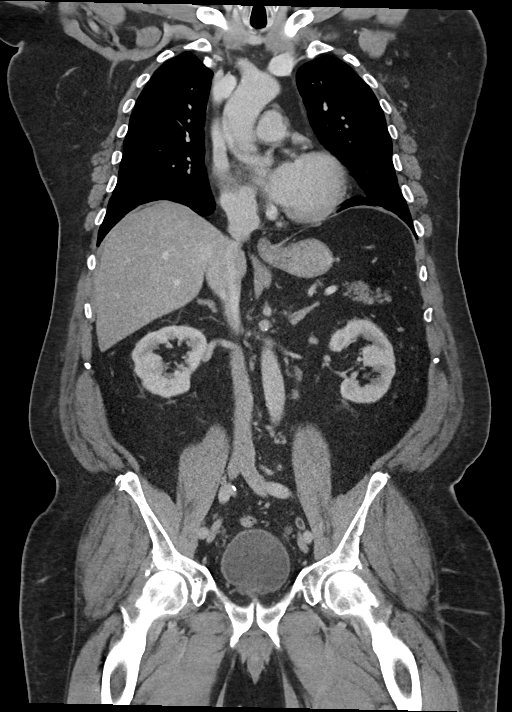
[im 72/130  soft-tissue]
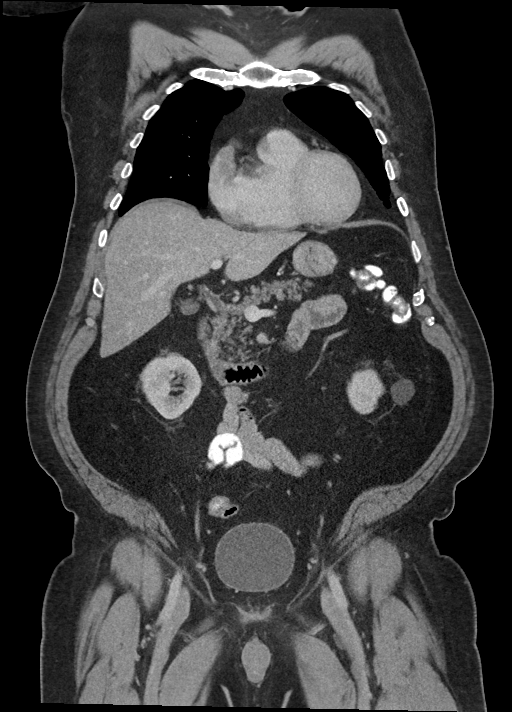

[14 of 46 positions shown; findings below may reference images not displayed]

FINDINGS: CT CHEST FINDINGS

Cardiovascular: The heart is normal in size. No pericardial
effusion. The aorta is normal in caliber. Minimal scattered
atherosclerotic calcifications. Focal aneurysm dissection. The
branch vessels are patent. Minimal scattered coronary artery
calcifications.

Mediastinum/Nodes: No mediastinal or hilar mass or lymphadenopathy.
The esophagus is grossly.

Lungs/Pleura: No acute pulmonary findings. No worrisome pulmonary
lesions pulmonary nodules to suggest metastatic disease. No pleural
effusions or pleural nodules.

Musculoskeletal: No significant bony findings. No chest wall mass,
supraclavicular or axillary adenopathy.

16 mm left thyroid nodule. Recommend thyroid US (ref: [HOSPITAL]. [DATE]): 143-50).

CT ABDOMEN PELVIS FINDINGS

Hepatobiliary: No worrisome hepatic lesions to suggest hepatic
metastatic disease. Gallbladder is unremarkable. No common bile duct
dilatation.

Pancreas: No mass, inflammation or ductal dilatation.

Spleen: Normal size.  No focal lesions.

Adrenals/Urinary Tract: Adrenal glands and kidneys are unremarkable.
No worrisome renal lesions or hydronephrosis. No renal or
obstructing ureteral calculi. Simple exophytic left renal cyst
noted.

The bladder is unremarkable. No bladder mass or asymmetric bladder
wall thickening. Enlarged prostate gland with median lobe
hypertrophy impressing on the base of bladder.

Stomach/Bowel: The stomach, duodenum and small bowel are
unremarkable. Terminal ileum is normal. The appendix is normal.

There is a partially circumferential apple-core type neoplastic
process involving the transverse colon. The maximum length is
cm. Moderate luminal narrowing mainly along the proximal aspect. I
do not see any enlarged pericolonic lymph nodes.

The remainder of the colon is unremarkable. I do not see any other
colonic lesions. There is moderate scattered colonic diverticulosis.

Vascular/Lymphatic: Scattered atherosclerotic calcifications
involving the aorta, iliac arteries and branch vessels but no
aneurysm or dissection. The major venous structures are patent.

No mesenteric or retroperitoneal mass or adenopathy.

Reproductive: Enlarged prostate gland with median lobe hypertrophy
impressing on the base of the bladder. The seminal vesicles are
unremarkable.

Other: No pelvic mass or adenopathy. No free pelvic fluid
collections. No inguinal mass or adenopathy. No abdominal wall
hernia or subcutaneous lesions. Small inguinal hernias containing
fat.

Musculoskeletal: No significant bony findings.
IMPRESSION: 1. 4.6 cm partially circumferential apple-core type neoplastic
process involving the mid transverse colon. No findings for
locoregional adenopathy or distant metastatic disease.
2. Enlarged prostate gland with median lobe hypertrophy impressing
on the base of the bladder.
3. 16 mm left thyroid nodule. Recommend follow-up thyroid ultrasound
examination.

Aortic Atherosclerosis ([id number]).

## 2022-04-12 MED FILL — Dexamethasone Sodium Phosphate Inj 100 MG/10ML: INTRAMUSCULAR | Qty: 1 | Status: AC

## 2022-04-13 ENCOUNTER — Inpatient Hospital Stay: Payer: Medicare HMO

## 2022-04-13 ENCOUNTER — Encounter: Payer: Self-pay | Admitting: Nurse Practitioner

## 2022-04-13 ENCOUNTER — Inpatient Hospital Stay (HOSPITAL_BASED_OUTPATIENT_CLINIC_OR_DEPARTMENT_OTHER): Payer: Medicare HMO | Admitting: Nurse Practitioner

## 2022-04-13 DIAGNOSIS — C184 Malignant neoplasm of transverse colon: Secondary | ICD-10-CM | POA: Diagnosis not present

## 2022-04-13 DIAGNOSIS — Z5112 Encounter for antineoplastic immunotherapy: Secondary | ICD-10-CM | POA: Diagnosis not present

## 2022-04-13 DIAGNOSIS — E041 Nontoxic single thyroid nodule: Secondary | ICD-10-CM | POA: Diagnosis not present

## 2022-04-13 DIAGNOSIS — C189 Malignant neoplasm of colon, unspecified: Secondary | ICD-10-CM | POA: Diagnosis not present

## 2022-04-13 DIAGNOSIS — Z452 Encounter for adjustment and management of vascular access device: Secondary | ICD-10-CM | POA: Diagnosis not present

## 2022-04-13 DIAGNOSIS — C7800 Secondary malignant neoplasm of unspecified lung: Secondary | ICD-10-CM | POA: Diagnosis not present

## 2022-04-13 DIAGNOSIS — R197 Diarrhea, unspecified: Secondary | ICD-10-CM | POA: Diagnosis not present

## 2022-04-13 DIAGNOSIS — R030 Elevated blood-pressure reading, without diagnosis of hypertension: Secondary | ICD-10-CM | POA: Diagnosis not present

## 2022-04-13 DIAGNOSIS — Z5111 Encounter for antineoplastic chemotherapy: Secondary | ICD-10-CM | POA: Diagnosis not present

## 2022-04-13 LAB — CMP (CANCER CENTER ONLY)
ALT: 17 U/L (ref 0–44)
AST: 18 U/L (ref 15–41)
Albumin: 3.5 g/dL (ref 3.5–5.0)
Alkaline Phosphatase: 58 U/L (ref 38–126)
Anion gap: 4 — ABNORMAL LOW (ref 5–15)
BUN: 12 mg/dL (ref 8–23)
CO2: 28 mmol/L (ref 22–32)
Calcium: 8.4 mg/dL — ABNORMAL LOW (ref 8.9–10.3)
Chloride: 109 mmol/L (ref 98–111)
Creatinine: 0.93 mg/dL (ref 0.61–1.24)
GFR, Estimated: >60 mL/min (ref >60)
Glucose, Bld: 92 mg/dL (ref 70–99)
Potassium: 3.5 mmol/L (ref 3.5–5.1)
Sodium: 141 mmol/L (ref 135–145)
Total Bilirubin: 0.6 mg/dL (ref 0.3–1.2)
Total Protein: 6.2 g/dL — ABNORMAL LOW (ref 6.5–8.1)

## 2022-04-13 LAB — CBC WITH DIFFERENTIAL (CANCER CENTER ONLY)
Abs Immature Granulocytes: 0.01 10*3/uL (ref 0.00–0.07)
Basophils Absolute: 0.1 10*3/uL (ref 0.0–0.1)
Basophils Relative: 1 %
Eosinophils Absolute: 0.2 10*3/uL (ref 0.0–0.5)
Eosinophils Relative: 3 %
HCT: 43.1 % (ref 39.0–52.0)
Hemoglobin: 15 g/dL (ref 13.0–17.0)
Immature Granulocytes: 0 %
Lymphocytes Relative: 27 %
Lymphs Abs: 1.5 10*3/uL (ref 0.7–4.0)
MCH: 31.6 pg (ref 26.0–34.0)
MCHC: 34.8 g/dL (ref 30.0–36.0)
MCV: 90.9 fL (ref 80.0–100.0)
Monocytes Absolute: 0.8 10*3/uL (ref 0.1–1.0)
Monocytes Relative: 14 %
Neutro Abs: 3.1 10*3/uL (ref 1.7–7.7)
Neutrophils Relative %: 55 %
Platelet Count: 108 10*3/uL — ABNORMAL LOW (ref 150–400)
RBC: 4.74 MIL/uL (ref 4.22–5.81)
RDW: 15.4 % (ref 11.5–15.5)
WBC Count: 5.7 10*3/uL (ref 4.0–10.5)
nRBC: 0 % (ref 0.0–0.2)

## 2022-04-13 LAB — TOTAL PROTEIN, URINE DIPSTICK: Protein, ur: NEGATIVE mg/dL

## 2022-04-13 MED ORDER — DEXTROSE 5 % IV SOLN: Freq: Once | INTRAVENOUS | Status: AC

## 2022-04-13 MED ORDER — LEUCOVORIN CALCIUM INJECTION 350 MG
400.0000 mg/m2 | Freq: Once | INTRAVENOUS | Status: AC
  Administered 2022-04-13: 952 mg via INTRAVENOUS
  Filled 2022-04-13: qty 47.6

## 2022-04-13 MED ORDER — PALONOSETRON HCL INJECTION 0.25 MG/5ML
0.2500 mg | Freq: Once | INTRAVENOUS | Status: AC
  Administered 2022-04-13: 0.25 mg via INTRAVENOUS
  Filled 2022-04-13: qty 5

## 2022-04-13 MED ORDER — OXALIPLATIN CHEMO INJECTION 100 MG/20ML
85.0000 mg/m2 | Freq: Once | INTRAVENOUS | Status: AC
  Administered 2022-04-13: 200 mg via INTRAVENOUS
  Filled 2022-04-13: qty 40

## 2022-04-13 MED ORDER — SODIUM CHLORIDE 0.9 % IV SOLN
2400.0000 mg/m2 | INTRAVENOUS | Status: DC
  Administered 2022-04-13: 5700 mg via INTRAVENOUS
  Filled 2022-04-13: qty 114

## 2022-04-13 MED ORDER — SODIUM CHLORIDE 0.9 % IV SOLN
10.0000 mg | Freq: Once | INTRAVENOUS | Status: AC
  Administered 2022-04-13: 10 mg via INTRAVENOUS
  Filled 2022-04-13: qty 10

## 2022-04-13 MED ORDER — SODIUM CHLORIDE 0.9 % IV SOLN
5.0000 mg/kg | Freq: Once | INTRAVENOUS | Status: AC
  Administered 2022-04-13: 600 mg via INTRAVENOUS
  Filled 2022-04-13: qty 16

## 2022-04-13 MED ORDER — SODIUM CHLORIDE 0.9% FLUSH
10.0000 mL | INTRAVENOUS | Status: AC | PRN
  Administered 2022-04-13: 10 mL

## 2022-04-13 MED ORDER — SODIUM CHLORIDE 0.9 % IV SOLN: Freq: Once | INTRAVENOUS | Status: AC

## 2022-04-13 MED ORDER — SODIUM CHLORIDE 0.9% FLUSH: 10.0000 mL | INTRAVENOUS | Status: DC | PRN

## 2022-04-13 NOTE — Progress Notes (Signed)
[facility]   [phone] [phone]   Clinic Follow up Note   Patient Care Team: Thomas Sheriff, MD as PCP - General (Family Medicine) Thomas Salines, DO as PCP - Cardiology (Cardiology) Thomas Merle, MD as Attending Physician (Hematology and Oncology) 04/13/2022  CHIEF COMPLAINT: Follow-up metastatic colon cancer  SUMMARY OF ONCOLOGIC HISTORY: Oncology History  Colon cancer ([facility])  10/2020 Procedure   Colonoscopy-Dr. Benson [facility]     11/06/2020 Imaging   CT CHEST ABDOMEN PELVIS W CONTRAST   IMPRESSION: 1. 4.6 cm partially circumferential apple-core type neoplastic process involving the mid transverse colon. No findings for locoregional adenopathy or distant metastatic disease. 2. Enlarged prostate gland with median lobe hypertrophy impressing on the base of the bladder. 3. 16 mm left thyroid nodule. Recommend follow-up thyroid ultrasound examination.   01/09/2021 Initial Diagnosis   Colon cancer (Thomas [facility])   01/09/2021 Pathology Results   B. COLON, RIGHT, RESECTION:  -  Adenocarcinoma, moderately differentiated, 3.0 cm  -  No carcinoma identified in seventeen lymph nodes (0/17)  -  Margins uninvolved by carcinoma  -  Tubular adenoma (x2)  -  Benign appendix  -  See oncology table and comment below   Margin Status for Invasive Carcinoma: All margins negative for  invasive carcinoma       Distance from Invasive Carcinoma to Radial (Circumferential)       Margin Status for Non-Invasive Tumor: All margins negative for  high-grade dysplasia / intramucosal carcinoma and low-grade dysplasia  Regional Lymph Nodes:       Number of Lymph Nodes with Tumor: 0       Number of Lymph Nodes Examined: 17  Tumor Deposits: 0  Distant Metastasis:       Distant Site(s) Involved: Not applicable  Pathologic Stage Classification (pTNM, AJCC 8th Edition): pT3, pN0   MMR Stable    01/09/2021 Cancer Staging   Staging form: Colon and Rectum, AJCC 8th Edition -  Pathologic stage from 01/09/2021: Stage IIA (pT3, pN0, cM0) - Signed by Thomas Merle, MD on 02/12/2022 Total positive nodes: 0 Histologic grading system: 4 grade system Histologic grade (G): G2 Residual tumor (R): R0 - None   05/2021 Tumor Marker   Patient's tumor was tested for the following markers: CEA. Results of the tumor marker test revealed 2.1.   12/11/2021 Procedure   Colonoscopy-Dr. Benson [facility]  There was evidence of a prior functional end-to-end ileo-colonic anastomosis in the transverse colon. This was patent and was characterized by healthy appearing mucosa. The anastomosis was traversed. Findings: Scattered small and large-mouthed diverticula were found in the sigmoid colon. - Patent functional end-to-end ileo-colonic anastomosis, characterized by healthy appearing mucosa. - Diverticulosis in the sigmoid colon. - No specimens collected.   01/25/2022 Imaging   CT CHEST ABDOMEN PELVIS W CONTRAST   IMPRESSION: 1. New right-sided pulmonary nodules measure up to 11 mm, nonspecific but concerning for metastatic disease. Consider further evaluation with nuclear medicine PET/CT. 2. New nodularity/lymph nodes in the central mesentery, concerning for nodal disease involvement. 3. Slightly increased size of prominent retroperitoneal lymph nodes, nonspecific but also somewhat concerning for nodal disease involvement. 4. Surgical change of partial colectomy without evidence of local recurrence. 5. Stable prominent mediastinal lymph nodes, nonspecific but stability is reassuring. Attention on follow-up imaging suggested. 6. Stable prominent right external iliac lymph node, nonspecific but stability is reassuring. Attention on follow-up imaging suggested. 7. Stable hypodense 8 mm hepatic lesion technically too small to accurately characterize but stability is reassuring.  Attention on follow-up imaging suggested. 8. 1.9 cm incidental left thyroid nodule. Recommend thyroid US. Reference: J  Am Coll Radiol. 2015 Feb;12(2): 143-50 9.  Aortic Atherosclerosis (ICD10-I70.0).   02/09/2022 Procedure   Bronchoscopy under the care of Dr. Valeta [facility]    02/09/2022 Pathology Results   FINAL MICROSCOPIC DIAGNOSIS:   A. LUNG, RUL, FINE NEEDLE ASPIRATION  BIOPSY FORCEPS:  Adenocarcinoma with morphologic features consistent with metastasis from a colorectal primary.  Please see comment.   Comment: The following immunostains are performed with appropriate controls :  TTF-1: Negative .  Napsin A: Negative .  CK7: Negative.  CK20: Positive.  CDX2: Positive .   The above immuno histochemical profile is supportive of the rendered diagnosis.    Colon cancer metastasized to lung [facility])  02/09/2022 Pathology Results   FINAL MICROSCOPIC DIAGNOSIS:   A. LUNG, RUL, FINE NEEDLE ASPIRATION  BIOPSY FORCEPS:  Adenocarcinoma with morphologic features consistent with metastasis from a colorectal primary.  Please see comment.   Comment: The following immunostains are performed with appropriate controls :  TTF-1: Negative .  Napsin A: Negative .  CK7: Negative.  CK20: Positive.  CDX2: Positive .   The above immuno histochemical profile is supportive of the rendered diagnosis.    02/12/2022 Initial Diagnosis   Colon cancer metastasized to lung (Thomas [facility])   03/03/2022 -  Chemotherapy   Patient is on Treatment Plan : COLORECTAL FOLFOX + Bevacizumab q14d       CURRENT THERAPY: FOLFOX q. 14 days starting 03/03/2022; bevacizumab added with cycle 2 on 9/20  INTERVAL HISTORY: Mr. Thomas Knight returns for follow-up as scheduled, last seen by Dr. Burr [facility] 10/4 and proceeded with cycle 3 FOLFOX/Beva.  He feels that treatment went about the same.  Diarrhea begins on day 5 and has up to 6 liquid/soft small BMs for the next 6 days.  He takes up to 4 Imodium which helps.  Feels that he drinks enough during times of diarrhea. His appetite is good, energy is fair, and improves more the week after treatment.  Cold sensitivity lasts  7-10 days with no residual neuropathy.  For 4-8 days after chemo he has joint pain mainly in his jaw and hips but mild, remains mobile/active.  Denies new or worsening pain.  Denies fever, chills, cough, chest pain, dyspnea, or other new specific complaints.  He has not started amlodipine.  All other systems were reviewed with the patient and are negative.  MEDICAL HISTORY:  Past Medical History:  Diagnosis Date   Bilateral leg edema 08/24/2019   Cancer ([place])    skin ca, colon   Chronic cough    Chronic pain of left knee 12/01/2015   COVID-19    06/2019   Dysrhythmia    afib  was cardioverted   Essential hypertension 08/24/2019   Mixed hyperlipidemia 08/24/2019   Obesity (BMI 30-39.9) 08/24/2019   Persistent atrial fibrillation ([place]) 08/24/2019    SURGICAL HISTORY: Past Surgical History:  Procedure Laterality Date   ACHILLES TENDON REPAIR Left 2012   Ruptured   BRONCHIAL BIOPSY  02/09/2022   Procedure: BRONCHIAL BIOPSIES;  Surgeon: Garner Nash, DO;  Location: [facility];  Service: Pulmonary;;   BRONCHIAL NEEDLE ASPIRATION BIOPSY  02/09/2022   Procedure: BRONCHIAL NEEDLE ASPIRATION BIOPSIES;  Surgeon: Garner Nash, DO;  Location: [facility];  Service: Pulmonary;;   COLONOSCOPY WITH PROPOFOL N/A 12/11/2021   Procedure: COLONOSCOPY WITH PROPOFOL;  Surgeon: Carol Ada, MD;  Location: [facility];  Service: Gastroenterology;  Laterality: N/A;  HERNIA REPAIR     umbilical hernia   IR IMAGING GUIDED PORT INSERTION  02/18/2022   SKIN SURGERY Left    Basal cell carcinoma on left forearm   XI ROBOTIC ASSISTED LOWER ANTERIOR RESECTION N/A 01/09/2021   Procedure: XI ROBOTIC ASSISTED RIGHT HEMI COLECTOMY, LYSIS OF ADHESIONS, SMALL BOWEL RESECTION, INTRAOPERATIVE ASSESMENT OF PERFUSION, PARTIAL OMENTECTOMY;  Surgeon: Ileana Roup, MD;  Location: [facility];  Service: General;  Laterality: N/A;    I have reviewed the social history and family history with the patient and  they are unchanged from previous note.  ALLERGIES:  is allergic to tramadol.  MEDICATIONS:  Current Outpatient Medications  Medication Sig Dispense Refill   acetaminophen (TYLENOL) 500 MG tablet Take 500-1,000 mg by mouth every 6 (six) hours as needed (pain.).     amLODipine (NORVASC) 5 MG tablet Take 1 tablet (5 mg total) by mouth daily. 30 tablet 0   Ascorbic Acid (VITAMIN C WITH ROSE HIPS) 500 MG tablet Take 500 mg by mouth every evening.     Cholecalciferol (D3-1000) 25 MCG (1000 UT) capsule Take 1,000 Units by mouth every evening.     ELIQUIS 5 MG TABS tablet Take 1 tablet (5 mg total) by mouth 2 (two) times daily. 180 tablet 0   lidocaine-prilocaine (EMLA) cream Apply 1 Application topically as needed. 30 g 2   lidocaine-prilocaine (EMLA) cream Apply to affected area once 30 g 3   metoprolol tartrate (LOPRESSOR) 25 MG tablet Take 1 tablet (25 mg total) by mouth 2 (two) times daily. 180 tablet 3   Misc Natural Products (JOINT SUPPORT PO) Take 1 tablet by mouth daily. Instaflex Advanced Joint Support     nitroGLYCERIN (NITROSTAT) 0.4 MG SL tablet Place 1 tablet (0.4 mg total) under the tongue every 5 (five) minutes as needed. 25 tablet 7   ondansetron (ZOFRAN) 8 MG tablet Take 1 tablet every 8 hours as needed for nausea/vomiting starting on 3 days after chemotherapy 30 tablet 2   ondansetron (ZOFRAN) 8 MG tablet Take 1 tablet (8 mg total) by mouth every 8 (eight) hours as needed for nausea or vomiting. Start on the third day after chemotherapy. 30 tablet 1   prochlorperazine (COMPAZINE) 10 MG tablet Take 1 tablet (10 mg total) by mouth every 6 (six) hours as needed. 30 tablet 2   prochlorperazine (COMPAZINE) 10 MG tablet Take 1 tablet (10 mg total) by mouth every 6 (six) hours as needed for nausea or vomiting. 30 tablet 1   vitamin B-12 (CYANOCOBALAMIN) 1000 MCG tablet Take 1,000 mcg by mouth every evening.     Zinc 50 MG CAPS Take 50 mg by mouth every evening.     No current  facility-administered medications for this visit.   Facility-Administered Medications Ordered in Other Visits  Medication Dose Route Frequency Provider Last Rate Last Admin   fluorouracil (ADRUCIL) 5,700 mg in sodium chloride 0.9 % 136 mL chemo infusion  2,400 mg/m2 (Treatment Plan Recorded) Intravenous 1 day or 1 dose Thomas Merle, MD       leucovorin 952 mg in dextrose 5 % 250 mL infusion  400 mg/m2 (Treatment Plan Recorded) Intravenous Once Thomas Merle, MD 149 mL/hr at 04/13/22 1243 952 mg at 04/13/22 1243   oxaliplatin (ELOXATIN) 200 mg in dextrose 5 % 500 mL chemo infusion  85 mg/m2 (Treatment Plan Recorded) Intravenous Once Thomas Merle, MD 270 mL/hr at 04/13/22 1246 200 mg at 04/13/22 1246   sodium chloride flush (NS) 0.9 % injection  10 mL  10 mL Intracatheter PRN Thomas Merle, MD        PHYSICAL EXAMINATION: ECOG PERFORMANCE STATUS: 1 - Symptomatic but completely ambulatory  Vitals:   04/13/22 1002  BP: (!) 187/91  Pulse: 69  Resp: 16  Temp: 97.9 F (36.6 C)  SpO2: 96%   Filed Weights   04/13/22 1002  Weight: 245 lb 6.4 oz (111.3 kg)    GENERAL:alert, no distress and comfortable SKIN: No rash EYES: sclera clear LUNGS:  normal breathing effort HEART: no lower extremity edema NEURO: alert & oriented x 3 with fluent speech, no focal motor/sensory deficits PAC without erythema  LABORATORY DATA:  I have reviewed the data as listed    Latest Ref Rng & Units 04/13/2022    9:24 AM 03/31/2022   11:15 AM 03/17/2022    9:20 AM  CBC  WBC 4.0 - 10.5 K/uL 5.7  5.6  5.7   Hemoglobin 13.0 - 17.0 g/dL 15.0  15.0  15.2   Hematocrit 39.0 - 52.0 % 43.1  43.5  44.1   Platelets 150 - 400 K/uL 108  122  165         Latest Ref Rng & Units 04/13/2022    9:24 AM 03/31/2022   11:15 AM 03/17/2022    9:20 AM  CMP  Glucose 70 - 99 mg/dL 92  111  106   BUN 8 - 23 mg/dL 12  11  10    Creatinine 0.61 - 1.24 mg/dL 0.93  0.86  0.99   Sodium 135 - 145 mmol/L 141  141  142   Potassium 3.5 - 5.1 mmol/L  3.5  3.7  3.6   Chloride 98 - 111 mmol/L 109  109  108   CO2 22 - 32 mmol/L 28  28  29    Calcium 8.9 - 10.3 mg/dL 8.4  8.4  8.5   Total Protein 6.5 - 8.1 g/dL 6.2  5.8  6.0   Total Bilirubin 0.3 - 1.2 mg/dL 0.6  0.7  0.5   Alkaline Phos 38 - 126 U/L 58  51  51   AST 15 - 41 U/L 18  21  21    ALT 0 - 44 U/L 17  27  26        RADIOGRAPHIC STUDIES: I have personally reviewed the radiological images as listed and agreed with the findings in the report. No results found.   ASSESSMENT & PLAN: Thomas [facility] is a 76 y.o. male with    1. Right side colon cancer stage II (pT3, N0), metastatic disease to the lung found in July 2023, MSS, KRAS/NRAS/BRAF wild type  -found on screening colonoscopy. S/p right hemicolectomy 01/09/21  -surveillance CT CAP on 01/25/22 showed: new right lung nodules, up to 11 mm; new nodularity/lymph nodes in the central mesentery. PET scan 02/04/22 showed no hypermetabolic disease. -CEA on 01/29/22 WNL at 1.97 -bronchoscopy on 02/09/22 by Dr. Valeta [facility] showed adenocarcinoma in RUL, with morphologic features consistent with metastasis from a colorectal primary. -[facility] showed MSS, low tumor burden, KRAS/NRAS/BRAF wild-type, no other targetable mutations. The benefit of EGFR inhibitor is much less in right-sided colon cancer, so will not give it as first line but may consider in subsequent lines. -he started FOLFOX on 03/03/22.  Bevcacizumab added with cycle 2 -Mr. Dillon appears stable.  He completed 3 cycles of FOLFOX/Beva, tolerating moderately well with stable fatigue, cold sensitivity, joint pains, and diarrhea.  He has not increased Imodium yet but plans to do so, I  encouraged him.  Side effects are adequately managed with supportive care at home.   -He recovers well by day 8-10; no clinical evidence of disease progression -Labs reviewed, adequate to proceed with cycle 4 FOLFOX and Beva today as planned, same dose. -Plan to restage after cycle 5 or 6 -Follow-up in 2 weeks  with cycle 5   2. Chemo toxicities: Diarrhea, elevated BP -s/p C2, he reports 5 days of diarrhea with 6-8 BM per day. He endorses using imodium, but I encouraged him to use more.  Dr. Burr Knight previously discussed option of changing the 5FU pump to oral Xeloda if he has persistent diarrhea. Will see how cycle 4 goes with increased imodium -S/p cycle 3 he had 6 days of soft/liquid stools up to 6/day; appears to be slightly improved.  I encouraged him to increase Imodium -he has rising BP secondary to beva. Amlodipine 19m was prescribed but he has not started it.  He agrees to start   Plan: -Labs reviewed -Proceed with cycle 4 FOLFOX and Beva today as planned, same dose -Patient agrees to increase Imodium and start amlodipine -Follow-up in 2 weeks with cycle 5 -Restage in 3 - 5 weeks   Orders Placed This Encounter  Procedures   CT CHEST ABDOMEN PELVIS W CONTRAST    Standing Status:   Future    Standing Expiration Date:   04/14/2023    Order Specific Question:   Pref[facility] imaging [facility]?    [facility]   Order Specific Question:   Is Oral Contrast requested for this exam?    Answer:   Yes, Per Radiology protocol   Total Protein, Urine dipstick    Standing Status:   Future    Number of Occurrences:   1    Standing Expiration Date:   04/14/2023   All questions were answered. The patient knows to call the clinic with any problems, questions or concerns. No barriers to learning were detected.      LAlla Feeling NP 04/13/22

## 2022-04-13 NOTE — Progress Notes (Signed)
Ok to treat today D1/C4 with hypertension per Cira Rue NP.

## 2022-04-13 NOTE — Patient Instructions (Signed)
[facility]  Discharge Instructions: Thank you for choosing [facility] to provide your oncology and hematology care.   If you have a lab appointment with the [facility], please go directly to the [facility] and check in at the registration area.   Wear comfortable clothing and clothing appropriate for easy access to any Portacath or PICC line.   We strive to give you quality time with your provider. You may need to reschedule your appointment if you arrive late (15 or more minutes).  Arriving late affects you and other patients whose appointments are after yours.  Also, if you miss three or more appointments without notifying the office, you may be dismissed from the clinic at the provider's discretion.      For prescription refill requests, have your pharmacy contact our office and allow 72 hours for refills to be completed.    Today you received the following chemotherapy and/or immunotherapy agents: Bevacizumab, Oxaliplatin, Leucovorin, Fluorourcil      To help prevent nausea and vomiting after your treatment, we encourage you to take your nausea medication as directed.  BELOW ARE SYMPTOMS THAT SHOULD BE REPORTED IMMEDIATELY: *FEVER GREATER THAN 100.4 F (38 C) OR HIGHER *CHILLS OR SWEATING *NAUSEA AND VOMITING THAT IS NOT CONTROLLED WITH YOUR NAUSEA MEDICATION *UNUSUAL SHORTNESS OF BREATH *UNUSUAL BRUISING OR BLEEDING *URINARY PROBLEMS (pain or burning when urinating, or frequent urination) *BOWEL PROBLEMS (unusual diarrhea, constipation, pain near the anus) TENDERNESS IN MOUTH AND THROAT WITH OR WITHOUT PRESENCE OF ULCERS (sore throat, sores in mouth, or a toothache) UNUSUAL RASH, SWELLING OR PAIN  UNUSUAL VAGINAL DISCHARGE OR ITCHING   Items with * indicate a potential emergency and should be followed up as soon as possible or go to the Emergency Department if any problems should occur.  Please show the CHEMOTHERAPY ALERT CARD or  IMMUNOTHERAPY ALERT CARD at check-in to the Emergency Department and triage nurse.  Should you have questions after your visit or need to cancel or reschedule your appointment, please contact [facility]  Dept: [phone]  and follow the prompts.  Office hours are 8:00 a.m. to 4:30 p.m. Monday - Friday. Please note that voicemails left after 4:00 p.m. may not be returned until the following business day.  We are closed weekends and major holidays. You have access to a nurse at all times for urgent questions. Please call the main number to the clinic Dept: [phone] and follow the prompts.   For any non-urgent questions, you may also contact your provider using MyChart. We now offer e-Visits for anyone 32 and older to request care online for non-urgent symptoms. For details visit [email or web address].   Also download the MyChart app! Go to the app store, search "MyChart", open the app, select [facility], and log in with your MyChart username and password.  Masks are optional in the cancer centers. If you would like for your care team to wear a mask while they are taking care of you, please let them know. You may have one support person who is at least 77 years old accompany you for your appointments.

## 2022-04-15 ENCOUNTER — Inpatient Hospital Stay: Payer: Medicare HMO

## 2022-04-15 ENCOUNTER — Other Ambulatory Visit: Payer: Self-pay

## 2022-04-15 VITALS — BP 169/79 | HR 59 | Resp 17

## 2022-04-15 DIAGNOSIS — Z452 Encounter for adjustment and management of vascular access device: Secondary | ICD-10-CM | POA: Diagnosis not present

## 2022-04-15 DIAGNOSIS — R197 Diarrhea, unspecified: Secondary | ICD-10-CM | POA: Diagnosis not present

## 2022-04-15 DIAGNOSIS — C7800 Secondary malignant neoplasm of unspecified lung: Secondary | ICD-10-CM | POA: Diagnosis not present

## 2022-04-15 DIAGNOSIS — C189 Malignant neoplasm of colon, unspecified: Secondary | ICD-10-CM

## 2022-04-15 DIAGNOSIS — E860 Dehydration: Secondary | ICD-10-CM

## 2022-04-15 DIAGNOSIS — E041 Nontoxic single thyroid nodule: Secondary | ICD-10-CM | POA: Diagnosis not present

## 2022-04-15 DIAGNOSIS — Z5111 Encounter for antineoplastic chemotherapy: Secondary | ICD-10-CM | POA: Diagnosis not present

## 2022-04-15 DIAGNOSIS — C184 Malignant neoplasm of transverse colon: Secondary | ICD-10-CM | POA: Diagnosis not present

## 2022-04-15 DIAGNOSIS — R030 Elevated blood-pressure reading, without diagnosis of hypertension: Secondary | ICD-10-CM | POA: Diagnosis not present

## 2022-04-15 DIAGNOSIS — Z5112 Encounter for antineoplastic immunotherapy: Secondary | ICD-10-CM | POA: Diagnosis not present

## 2022-04-15 MED ORDER — SODIUM CHLORIDE 0.9% FLUSH: 10.0000 mL | Freq: Once | INTRAVENOUS | Status: DC | PRN

## 2022-04-15 MED ORDER — HEPARIN SOD (PORK) LOCK FLUSH 100 UNIT/ML IV SOLN
500.0000 [IU] | Freq: Once | INTRAVENOUS | Status: AC | PRN
  Administered 2022-04-15: 500 [IU]

## 2022-04-15 MED ORDER — SODIUM CHLORIDE 0.9% FLUSH
10.0000 mL | INTRAVENOUS | Status: DC | PRN
  Administered 2022-04-15: 10 mL

## 2022-04-15 MED ORDER — HEPARIN SOD (PORK) LOCK FLUSH 100 UNIT/ML IV SOLN: 500.0000 [IU] | Freq: Once | INTRAVENOUS | Status: DC | PRN

## 2022-04-16 ENCOUNTER — Other Ambulatory Visit: Payer: Self-pay

## 2022-04-27 MED FILL — Dexamethasone Sodium Phosphate Inj 100 MG/10ML: INTRAMUSCULAR | Qty: 1 | Status: AC

## 2022-04-28 ENCOUNTER — Inpatient Hospital Stay: Payer: Medicare HMO | Attending: Physician Assistant

## 2022-04-28 ENCOUNTER — Encounter: Payer: Self-pay | Admitting: Hematology

## 2022-04-28 ENCOUNTER — Other Ambulatory Visit: Payer: Self-pay

## 2022-04-28 ENCOUNTER — Inpatient Hospital Stay (HOSPITAL_BASED_OUTPATIENT_CLINIC_OR_DEPARTMENT_OTHER): Payer: Medicare HMO | Admitting: Hematology

## 2022-04-28 ENCOUNTER — Inpatient Hospital Stay: Payer: Medicare HMO

## 2022-04-28 VITALS — BP 188/89 | HR 53 | Temp 97.8°F | Resp 18 | Wt 247.3 lb

## 2022-04-28 VITALS — BP 158/89 | HR 62 | Temp 98.0°F | Resp 18

## 2022-04-28 DIAGNOSIS — I509 Heart failure, unspecified: Secondary | ICD-10-CM | POA: Insufficient documentation

## 2022-04-28 DIAGNOSIS — R600 Localized edema: Secondary | ICD-10-CM | POA: Insufficient documentation

## 2022-04-28 DIAGNOSIS — Z452 Encounter for adjustment and management of vascular access device: Secondary | ICD-10-CM | POA: Insufficient documentation

## 2022-04-28 DIAGNOSIS — C189 Malignant neoplasm of colon, unspecified: Secondary | ICD-10-CM

## 2022-04-28 DIAGNOSIS — C7800 Secondary malignant neoplasm of unspecified lung: Secondary | ICD-10-CM | POA: Insufficient documentation

## 2022-04-28 DIAGNOSIS — C184 Malignant neoplasm of transverse colon: Secondary | ICD-10-CM | POA: Insufficient documentation

## 2022-04-28 DIAGNOSIS — I110 Hypertensive heart disease with heart failure: Secondary | ICD-10-CM | POA: Diagnosis not present

## 2022-04-28 DIAGNOSIS — Z5112 Encounter for antineoplastic immunotherapy: Secondary | ICD-10-CM | POA: Insufficient documentation

## 2022-04-28 DIAGNOSIS — J189 Pneumonia, unspecified organism: Secondary | ICD-10-CM | POA: Diagnosis not present

## 2022-04-28 DIAGNOSIS — R197 Diarrhea, unspecified: Secondary | ICD-10-CM | POA: Insufficient documentation

## 2022-04-28 DIAGNOSIS — R059 Cough, unspecified: Secondary | ICD-10-CM | POA: Insufficient documentation

## 2022-04-28 DIAGNOSIS — Z5111 Encounter for antineoplastic chemotherapy: Secondary | ICD-10-CM | POA: Insufficient documentation

## 2022-04-28 LAB — CMP (CANCER CENTER ONLY)
ALT: 15 U/L (ref 0–44)
AST: 12 U/L — ABNORMAL LOW (ref 15–41)
Albumin: 3.7 g/dL (ref 3.5–5.0)
Alkaline Phosphatase: 60 U/L (ref 38–126)
Anion gap: 4 — ABNORMAL LOW (ref 5–15)
BUN: 10 mg/dL (ref 8–23)
CO2: 31 mmol/L (ref 22–32)
Calcium: 8.8 mg/dL — ABNORMAL LOW (ref 8.9–10.3)
Chloride: 107 mmol/L (ref 98–111)
Creatinine: 0.89 mg/dL (ref 0.61–1.24)
GFR, Estimated: >60 mL/min (ref >60)
Glucose, Bld: 97 mg/dL (ref 70–99)
Potassium: 3.6 mmol/L (ref 3.5–5.1)
Sodium: 142 mmol/L (ref 135–145)
Total Bilirubin: 0.6 mg/dL (ref 0.3–1.2)
Total Protein: 6.7 g/dL (ref 6.5–8.1)

## 2022-04-28 LAB — CBC WITH DIFFERENTIAL (CANCER CENTER ONLY)
Abs Immature Granulocytes: 0.01 10*3/uL (ref 0.00–0.07)
Basophils Absolute: 0.1 10*3/uL (ref 0.0–0.1)
Basophils Relative: 1 %
Eosinophils Absolute: 0.2 10*3/uL (ref 0.0–0.5)
Eosinophils Relative: 3 %
HCT: 45.1 % (ref 39.0–52.0)
Hemoglobin: 15.6 g/dL (ref 13.0–17.0)
Immature Granulocytes: 0 %
Lymphocytes Relative: 27 %
Lymphs Abs: 1.5 10*3/uL (ref 0.7–4.0)
MCH: 32.3 pg (ref 26.0–34.0)
MCHC: 34.6 g/dL (ref 30.0–36.0)
MCV: 93.4 fL (ref 80.0–100.0)
Monocytes Absolute: 0.9 10*3/uL (ref 0.1–1.0)
Monocytes Relative: 16 %
Neutro Abs: 3 10*3/uL (ref 1.7–7.7)
Neutrophils Relative %: 53 %
Platelet Count: 143 10*3/uL — ABNORMAL LOW (ref 150–400)
RBC: 4.83 MIL/uL (ref 4.22–5.81)
RDW: 16.4 % — ABNORMAL HIGH (ref 11.5–15.5)
WBC Count: 5.7 10*3/uL (ref 4.0–10.5)
nRBC: 0 % (ref 0.0–0.2)

## 2022-04-28 LAB — TOTAL PROTEIN, URINE DIPSTICK: Protein, ur: 30 mg/dL — AB

## 2022-04-28 MED ORDER — SODIUM CHLORIDE 0.9 % IV SOLN
2400.0000 mg/m2 | INTRAVENOUS | Status: DC
  Administered 2022-04-28: 5700 mg via INTRAVENOUS
  Filled 2022-04-28: qty 114

## 2022-04-28 MED ORDER — SODIUM CHLORIDE 0.9 % IV SOLN: Freq: Once | INTRAVENOUS | Status: AC

## 2022-04-28 MED ORDER — SODIUM CHLORIDE 0.9 % IV SOLN
5.0000 mg/kg | Freq: Once | INTRAVENOUS | Status: AC
  Administered 2022-04-28: 600 mg via INTRAVENOUS
  Filled 2022-04-28: qty 16

## 2022-04-28 MED ORDER — OXALIPLATIN CHEMO INJECTION 100 MG/20ML
85.0000 mg/m2 | Freq: Once | INTRAVENOUS | Status: AC
  Administered 2022-04-28: 200 mg via INTRAVENOUS
  Filled 2022-04-28: qty 40

## 2022-04-28 MED ORDER — AMLODIPINE BESYLATE 10 MG PO TABS: 10.0000 mg | ORAL_TABLET | Freq: Every day | ORAL | 1 refills | Status: DC

## 2022-04-28 MED ORDER — SODIUM CHLORIDE 0.9 % IV SOLN
10.0000 mg | Freq: Once | INTRAVENOUS | Status: AC
  Administered 2022-04-28: 10 mg via INTRAVENOUS
  Filled 2022-04-28: qty 10

## 2022-04-28 MED ORDER — HEPARIN SOD (PORK) LOCK FLUSH 100 UNIT/ML IV SOLN: 500.0000 [IU] | Freq: Once | INTRAVENOUS | Status: DC | PRN

## 2022-04-28 MED ORDER — PALONOSETRON HCL INJECTION 0.25 MG/5ML
0.2500 mg | Freq: Once | INTRAVENOUS | Status: AC
  Administered 2022-04-28: 0.25 mg via INTRAVENOUS
  Filled 2022-04-28: qty 5

## 2022-04-28 MED ORDER — SODIUM CHLORIDE 0.9% FLUSH: 10.0000 mL | INTRAVENOUS | Status: DC | PRN

## 2022-04-28 MED ORDER — LEUCOVORIN CALCIUM INJECTION 350 MG
400.0000 mg/m2 | Freq: Once | INTRAVENOUS | Status: AC
  Administered 2022-04-28: 952 mg via INTRAVENOUS
  Filled 2022-04-28: qty 47.6

## 2022-04-28 MED ORDER — DEXTROSE 5 % IV SOLN: Freq: Once | INTRAVENOUS | Status: AC

## 2022-04-28 NOTE — Patient Instructions (Signed)
[facility]  Discharge Instructions: Thank you for choosing [facility] to provide your oncology and hematology care.   If you have a lab appointment with the [facility], please go directly to the [facility] and check in at the registration area.   Wear comfortable clothing and clothing appropriate for easy access to any Portacath or PICC line.   We strive to give you quality time with your provider. You may need to reschedule your appointment if you arrive late (15 or more minutes).  Arriving late affects you and other patients whose appointments are after yours.  Also, if you miss three or more appointments without notifying the office, you may be dismissed from the clinic at the provider's discretion.      For prescription refill requests, have your pharmacy contact our office and allow 72 hours for refills to be completed.    Today you received the following chemotherapy and/or immunotherapy agents: Bevacizumab, Oxaliplatin, Leucovorin, Fluorourcil      To help prevent nausea and vomiting after your treatment, we encourage you to take your nausea medication as directed.  BELOW ARE SYMPTOMS THAT SHOULD BE REPORTED IMMEDIATELY: *FEVER GREATER THAN 100.4 F (38 C) OR HIGHER *CHILLS OR SWEATING *NAUSEA AND VOMITING THAT IS NOT CONTROLLED WITH YOUR NAUSEA MEDICATION *UNUSUAL SHORTNESS OF BREATH *UNUSUAL BRUISING OR BLEEDING *URINARY PROBLEMS (pain or burning when urinating, or frequent urination) *BOWEL PROBLEMS (unusual diarrhea, constipation, pain near the anus) TENDERNESS IN MOUTH AND THROAT WITH OR WITHOUT PRESENCE OF ULCERS (sore throat, sores in mouth, or a toothache) UNUSUAL RASH, SWELLING OR PAIN  UNUSUAL VAGINAL DISCHARGE OR ITCHING   Items with * indicate a potential emergency and should be followed up as soon as possible or go to the Emergency Department if any problems should occur.  Please show the CHEMOTHERAPY ALERT CARD or  IMMUNOTHERAPY ALERT CARD at check-in to the Emergency Department and triage nurse.  Should you have questions after your visit or need to cancel or reschedule your appointment, please contact [facility]  Dept: [phone]  and follow the prompts.  Office hours are 8:00 a.m. to 4:30 p.m. Monday - Friday. Please note that voicemails left after 4:00 p.m. may not be returned until the following business day.  We are closed weekends and major holidays. You have access to a nurse at all times for urgent questions. Please call the main number to the clinic Dept: [phone] and follow the prompts.   For any non-urgent questions, you may also contact your provider using MyChart. We now offer e-Visits for anyone 65 and older to request care online for non-urgent symptoms. For details visit [email or web address].   Also download the MyChart app! Go to the app store, search "MyChart", open the app, select [facility], and log in with your MyChart username and password.  Masks are optional in the cancer centers. If you would like for your care team to wear a mask while they are taking care of you, please let them know. You may have one support person who is at least 77 years old accompany you for your appointments.

## 2022-04-28 NOTE — Progress Notes (Signed)
[place]   [phone] [phone]   Clinic Follow up Note   Patient Care Team: Angelina Sheriff, MD as PCP - General (Family Medicine) Berniece Salines, DO as PCP - Cardiology (Cardiology) Truitt Merle, MD as Attending Physician (Hematology and Oncology)  Date of Service:  04/28/2022  CHIEF COMPLAINT: f/u of metastatic colon cancer  CURRENT THERAPY:  FOLFOX, q14d, starting 03/03/22 -Bevacizumab added with C2 (9/20)  ASSESSMENT & PLAN:  Thomas Knight is a 76 y.o. male with   1. Right side colon cancer stage II (pT3, N0), metastatic disease to lung and possible nodes in July 2023, MSS, KRAS/NRAS/BRAF wild type  -found on screening colonoscopy. S/p right hemicolectomy 01/09/21  -surveillance CT CAP on 01/25/22 showed: new right lung nodules, up to 11 mm; new nodularity/lymph nodes in the central mesentery. PET scan 02/04/22 showed no hypermetabolic disease. -CEA on 01/29/22 WNL at 1.97 -bronchoscopy on 02/09/22 by Dr. Valeta Harms showed adenocarcinoma in RUL, with morphologic features consistent with metastasis from a colorectal primary. -[employer] showed MSS, low tumor burden, KRAS/NRAS/BRAF wild-type, no other targetable mutations. The benefit of EGFR inhibitor is much less in right-sided colon cancer, so I will not give it as first line but may consider in subsequent lines. -he started FOLFOX on 03/03/22. He tolerated well overall with diarrhea for several days. Bevacizumab added with C2 (9/20). -he continues to tolerate well aside from diarrhea (discussed below). Lab reviewed, plt count down to 143k. Adequate to proceed with C5 today. -order is in place for restaging CT. He can schedule either before or after C6.   2. Chemo toxicities: Diarrhea, hypertension  -s/p C2, he reports 5 days of diarrhea with 6-8 BM per day. Better controlled with higher dose of imodium. -he has rising BP secondary to beva. BP 188/89 today (04/28/22). He started amlodipine 04/13/22. I  advised him to increase to 10 mg; I called in today.     PLAN: -proceed with C5 FOLFOX with beva today as scheduled at same dose  -I called in 33m amlodipine -lab, f/u, and FOLFOX/beva every 2 weeks -restaging CT to be done anytime before next visit in 2 or 4 weeks, he will call to schedule   No problem-specific Assessment & Plan notes found for this encounter.   SUMMARY OF ONCOLOGIC HISTORY: Oncology History  [place] colon ([place]  10/[place] Procedure   Colonoscopy-Dr. HBenson Norway    11/06/2020 Imaging   CT CHEST ABDOMEN PELVIS W CONTRAST   IMPRESSION: 1. 4.6 cm partially circumferential apple-core type neoplastic process involving the mid transverse colon. No findings for locoregional adenopathy or distant metastatic disease. 2. Enlarged prostate gland with median lobe hypertrophy impressing on the base of the bladder. 3. 16 mm left thyroid nodule. Recommend follow-up thyroid ultrasound examination.   01/09/2021 Initial Diagnosis   [place]   01/09/2021 Pathology Results   B. COLON, RIGHT, RESECTION:  -  Adenocarcinoma, moderately differentiated, 3.0 cm  -  No carcinoma identified in seventeen lymph nodes (0/17)  -  Margins uninvolved by carcinoma  -  Tubular adenoma (x2)  -  Benign appendix  -  See oncology table and comment below   Margin Status for Invasive Carcinoma: All margins negative for  invasive carcinoma       Distance from Invasive Carcinoma to Radial (Circumferential)       Margin Status for Non-Invasive Tumor: All margins negative for  high-grade dysplasia / intramucosal carcinoma and low-grade dysplasia  Regional Lymph Nodes:  Number of Lymph Nodes with Tumor: 0       Number of Lymph Nodes Examined: 17  Tumor Deposits: 0  Distant Metastasis:       Distant Site(s) Involved: Not applicable  Pathologic Stage Classification (pTNM, AJCC 8th Edition): pT3, pN0   MMR Stable    01/09/2021 Cancer Staging   Staging form: Colon and Rectum,  AJCC 8th Edition - Pathologic stage from 01/09/2021: Stage IIA (pT3, pN0, cM0) - Signed by Truitt Merle, MD on 02/12/2022 Total positive nodes: 0 Histologic grading system: 4 grade system Histologic grade (G): G2 Residual tumor (R): R0 - None   05/2021 Tumor Marker   Patient's tumor was tested for the following markers: CEA. Results of the tumor marker test revealed 2.1.   12/11/2021 Procedure   Colonoscopy-Dr. Benson Norway  There was evidence of a prior functional end-to-end ileo-colonic anastomosis in the transverse colon. This was patent and was characterized by healthy appearing mucosa. The anastomosis was traversed. Findings: Scattered small and large-mouthed diverticula were found in the sigmoid colon. - Patent functional end-to-end ileo-colonic anastomosis, characterized by healthy appearing mucosa. - Diverticulosis in the sigmoid colon. - No specimens collected.   01/25/2022 Imaging   CT CHEST ABDOMEN PELVIS W CONTRAST   IMPRESSION: 1. New right-sided pulmonary nodules measure up to 11 mm, nonspecific but concerning for metastatic disease. Consider further evaluation with nuclear medicine PET/CT. 2. New nodularity/lymph nodes in the central mesentery, concerning for nodal disease involvement. 3. Slightly increased size of prominent retroperitoneal lymph nodes, nonspecific but also somewhat concerning for nodal disease involvement. 4. Surgical change of partial colectomy without evidence of local recurrence. 5. Stable prominent mediastinal lymph nodes, nonspecific but stability is reassuring. Attention on follow-up imaging suggested. 6. Stable prominent right external iliac lymph node, nonspecific but stability is reassuring. Attention on follow-up imaging suggested. 7. Stable hypodense 8 mm hepatic lesion technically too small to accurately characterize but stability is reassuring. Attention on follow-up imaging suggested. 8. 1.9 cm incidental left thyroid nodule. Recommend  thyroid US. Reference: J Am Coll Radiol. 2015 Feb;12(2): 143-50 9.  Aortic Atherosclerosis (ICD10-I70.0).   02/09/2022 Procedure   Bronchoscopy under the care of Dr. Valeta Harms    02/09/2022 Pathology Results   FINAL MICROSCOPIC DIAGNOSIS:   A. LUNG, RUL, FINE NEEDLE ASPIRATION  BIOPSY FORCEPS:  Adenocarcinoma with morphologic features consistent with metastasis from a colorectal primary.  Please see comment.   Comment: The following immunostains are performed with appropriate controls :  TTF-1: Negative .  Napsin A: Negative .  CK7: Negative.  CK20: Positive.  CDX2: Positive .   The above immuno histochemical profile is supportive of the rendered diagnosis.    Colon cancer metastasized to lung [facility])  02/09/2022 Pathology Results   FINAL MICROSCOPIC DIAGNOSIS:   A. LUNG, RUL, FINE NEEDLE ASPIRATION  BIOPSY FORCEPS:  Adenocarcinoma with morphologic features consistent with metastasis from a colorectal primary.  Please see comment.   Comment: The following immunostains are performed with appropriate controls :  TTF-1: Negative .  Napsin A: Negative .  CK7: Negative.  CK20: Positive.  CDX2: Positive .   The above immuno histochemical profile is supportive of the rendered diagnosis.    02/12/2022 Initial Diagnosis   Colon cancer metastasized to lung [facility])   03/03/2022 -  Chemotherapy   Patient is on Treatment Plan : COLORECTAL FOLFOX + Bevacizumab q14d        INTERVAL HISTORY:  Thomas Knight is here for a follow up of metastatic colon cancer. He  was last seen by NP Lacie on 04/13/22. He presents to the clinic accompanied by his wife. He reports he developed a dry cough, which he attributes to the recent weather change. He reports he has not had as much diarrhea with the increase in imodium. He notes his appetite has been "so-so."   All other systems were reviewed with the patient and are negative.  MEDICAL HISTORY:  Past Medical History:  Diagnosis Date   Bilateral leg  edema 08/24/2019   Cancer ([place])    skin ca, colon   Chronic cough    Chronic pain of left knee 12/01/2015   COVID-19    06/2019   Dysrhythmia    afib  was cardioverted   Essential hypertension 08/24/2019   Mixed hyperlipidemia 08/24/2019   Obesity (BMI 30-39.9) 08/24/2019   Persistent atrial fibrillation ([place]) 08/24/2019    SURGICAL HISTORY: Past Surgical History:  Procedure Laterality Date   ACHILLES TENDON REPAIR Left 2012   Ruptured   BRONCHIAL BIOPSY  02/09/2022   Procedure: BRONCHIAL BIOPSIES;  Surgeon: Garner Nash, DO;  Location: [place] [facility];  Service: Pulmonary;;   BRONCHIAL NEEDLE ASPIRATION BIOPSY  02/09/2022   Procedure: BRONCHIAL NEEDLE ASPIRATION BIOPSIES;  Surgeon: Garner Nash, DO;  Location: [place] [facility];  Service: Pulmonary;;   COLONOSCOPY WITH PROPOFOL N/A 12/11/2021   Procedure: COLONOSCOPY WITH PROPOFOL;  Surgeon: Carol Ada, MD;  Location: [facility];  Service: Gastroenterology;  Laterality: N/A;   HERNIA REPAIR     umbilical hernia   IR IMAGING GUIDED PORT INSERTION  02/18/2022   SKIN SURGERY Left    Basal cell carcinoma on left forearm   XI ROBOTIC ASSISTED LOWER ANTERIOR RESECTION N/A 01/09/2021   Procedure: XI ROBOTIC ASSISTED RIGHT HEMI COLECTOMY, LYSIS OF ADHESIONS, SMALL BOWEL RESECTION, INTRAOPERATIVE ASSESMENT OF PERFUSION, PARTIAL OMENTECTOMY;  Surgeon: Ileana Roup, MD;  Location: [facility];  Service: General;  Laterality: N/A;    I have reviewed the social history and family history with the patient and they are unchanged from previous note.  ALLERGIES:  is allergic to tramadol.  MEDICATIONS:  Current Outpatient Medications  Medication Sig Dispense Refill   amLODipine (NORVASC) 10 MG tablet Take 1 tablet (10 mg total) by mouth daily. 30 tablet 1   acetaminophen (TYLENOL) 500 MG tablet Take 500-1,000 mg by mouth every 6 (six) hours as needed (pain.).     Ascorbic Acid (VITAMIN C WITH ROSE HIPS) 500 MG tablet Take 500 mg by mouth  every evening.     Cholecalciferol (D3-1000) 25 MCG (1000 UT) capsule Take 1,000 Units by mouth every evening.     ELIQUIS 5 MG TABS tablet Take 1 tablet (5 mg total) by mouth 2 (two) times daily. 180 tablet 0   lidocaine-prilocaine (EMLA) cream Apply 1 Application topically as needed. 30 g 2   lidocaine-prilocaine (EMLA) cream Apply to affected area once 30 g 3   metoprolol tartrate (LOPRESSOR) 25 MG tablet Take 1 tablet (25 mg total) by mouth 2 (two) times daily. 180 tablet 3   Misc Natural Products (JOINT SUPPORT PO) Take 1 tablet by mouth daily. Instaflex Advanced Joint Support     nitroGLYCERIN (NITROSTAT) 0.4 MG SL tablet Place 1 tablet (0.4 mg total) under the tongue every 5 (five) minutes as needed. 25 tablet 7   ondansetron (ZOFRAN) 8 MG tablet Take 1 tablet every 8 hours as needed for nausea/vomiting starting on 3 days after chemotherapy 30 tablet 2   ondansetron (ZOFRAN) 8 MG tablet Take  1 tablet (8 mg total) by mouth every 8 (eight) hours as needed for nausea or vomiting. Start on the third day after chemotherapy. 30 tablet 1   prochlorperazine (COMPAZINE) 10 MG tablet Take 1 tablet (10 mg total) by mouth every 6 (six) hours as needed. 30 tablet 2   prochlorperazine (COMPAZINE) 10 MG tablet Take 1 tablet (10 mg total) by mouth every 6 (six) hours as needed for nausea or vomiting. 30 tablet 1   vitamin B-12 (CYANOCOBALAMIN) 1000 MCG tablet Take 1,000 mcg by mouth every evening.     Zinc 50 MG CAPS Take 50 mg by mouth every evening.     No current facility-administered medications for this visit.    PHYSICAL EXAMINATION: ECOG PERFORMANCE STATUS: 1 - Symptomatic but completely ambulatory  Vitals:   04/28/22 1030  BP: (!) 188/89  Pulse: (!) 53  Resp: 18  Temp: 97.8 F (36.6 C)  SpO2: 97%   Wt Readings from Last 3 Encounters:  04/28/22 247 lb 5 oz (112.2 kg)  04/13/22 245 lb 6.4 oz (111.3 kg)  03/31/22 249 lb 8 oz (113.2 kg)     GENERAL:alert, no distress and  comfortable SKIN: skin color normal, no rashes or significant lesions EYES: normal, Conjunctiva are pink and non-injected, sclera clear  NEURO: alert & oriented x 3 with fluent speech  LABORATORY DATA:  I have reviewed the data as listed    Latest Ref Rng & Units 04/28/2022   10:16 AM 04/13/2022    9:24 AM 03/31/2022   11:15 AM  CBC  WBC 4.0 - 10.5 K/uL 5.7  5.7  5.6   Hemoglobin 13.0 - 17.0 g/dL 15.6  15.0  15.0   Hematocrit 39.0 - 52.0 % 45.1  43.1  43.5   Platelets 150 - 400 K/uL 143  108  122         Latest Ref Rng & Units 04/28/2022   10:16 AM 04/13/2022    9:24 AM 03/31/2022   11:15 AM  CMP  Glucose 70 - 99 mg/dL 97  92  111   BUN 8 - 23 mg/dL _0 Creatinine 0.61 - 1.24 mg/dL 0.89  0.93  0.86   Sodium 135 - 145 mmol/L 142  141  141   Potassium 3.5 - 5.1 mmol/L 3.6  3.5  3.7   Chloride 98 - 111 mmol/L 107  109  109   CO2 22 - 32 mmol/L _1 Calcium 8.9 - 10.3 mg/dL 8.8  8.4  8.4   Total Protein 6.5 - 8.1 g/dL 6.7  6.2  5.8   Total Bilirubin 0.3 - 1.2 mg/dL 0.6  0.6  0.7   Alkaline Phos 38 - 126 U/L 60  58  51   AST 15 - 41 U/L _2 ALT 0 - 44 U/L _3 RADIOGRAPHIC STUDIES: I have personally reviewed the radiological images as listed and agreed with the findings in the report. No results found.    Orders Placed This Encounter  Procedures   CBC with Differential ([place] Only)    Standing Status:   Future    Standing Expiration Date:   05/12/2023   CMP ([place] only)    Standing Status:   Future    Standing Expiration Date:   05/12/2023   Total Protein, Urine dipstick    Standing Status:  Future    Standing Expiration Date:   05/12/2023   All questions were answered. The patient knows to call the clinic with any problems, questions or concerns. No barriers to learning was detected. The total time spent in the appointment was 30 minutes.     Truitt Merle, MD 04/28/2022   I, Wilburn Mylar, am acting as  scribe for Truitt Merle, MD.   I have reviewed the above documentation for accuracy and completeness, and I agree with the above.

## 2022-04-29 ENCOUNTER — Other Ambulatory Visit: Payer: Self-pay

## 2022-04-29 DIAGNOSIS — I480 Paroxysmal atrial fibrillation: Secondary | ICD-10-CM

## 2022-04-29 MED ORDER — ELIQUIS 5 MG PO TABS: 5.0000 mg | ORAL_TABLET | Freq: Two times a day (BID) | ORAL | 1 refills | Status: DC

## 2022-04-29 NOTE — Telephone Encounter (Signed)
Prescription refill request for Eliquis received. Indication:Afib Last office visit:5/23 Scr:0.8 Age: 76 Weight:112.2 kg  Prescription refilled

## 2022-04-30 ENCOUNTER — Inpatient Hospital Stay: Payer: Medicare HMO

## 2022-04-30 ENCOUNTER — Other Ambulatory Visit: Payer: Self-pay

## 2022-04-30 DIAGNOSIS — I509 Heart failure, unspecified: Secondary | ICD-10-CM | POA: Diagnosis not present

## 2022-04-30 DIAGNOSIS — I110 Hypertensive heart disease with heart failure: Secondary | ICD-10-CM | POA: Diagnosis not present

## 2022-04-30 DIAGNOSIS — R600 Localized edema: Secondary | ICD-10-CM | POA: Diagnosis not present

## 2022-04-30 DIAGNOSIS — R197 Diarrhea, unspecified: Secondary | ICD-10-CM | POA: Diagnosis not present

## 2022-04-30 DIAGNOSIS — Z5112 Encounter for antineoplastic immunotherapy: Secondary | ICD-10-CM | POA: Diagnosis not present

## 2022-04-30 DIAGNOSIS — R059 Cough, unspecified: Secondary | ICD-10-CM | POA: Diagnosis not present

## 2022-04-30 DIAGNOSIS — C7800 Secondary malignant neoplasm of unspecified lung: Secondary | ICD-10-CM | POA: Diagnosis not present

## 2022-04-30 DIAGNOSIS — C184 Malignant neoplasm of transverse colon: Secondary | ICD-10-CM | POA: Diagnosis not present

## 2022-04-30 DIAGNOSIS — Z5111 Encounter for antineoplastic chemotherapy: Secondary | ICD-10-CM | POA: Diagnosis not present

## 2022-04-30 DIAGNOSIS — Z452 Encounter for adjustment and management of vascular access device: Secondary | ICD-10-CM | POA: Diagnosis not present

## 2022-04-30 DIAGNOSIS — J189 Pneumonia, unspecified organism: Secondary | ICD-10-CM | POA: Diagnosis not present

## 2022-04-30 MED ORDER — SODIUM CHLORIDE 0.9% FLUSH
10.0000 mL | INTRAVENOUS | Status: DC | PRN
  Administered 2022-04-30: 10 mL

## 2022-04-30 MED ORDER — HEPARIN SOD (PORK) LOCK FLUSH 100 UNIT/ML IV SOLN
500.0000 [IU] | Freq: Once | INTRAVENOUS | Status: AC | PRN
  Administered 2022-04-30: 500 [IU]

## 2022-05-04 ENCOUNTER — Other Ambulatory Visit: Payer: Self-pay

## 2022-05-06 DIAGNOSIS — J189 Pneumonia, unspecified organism: Secondary | ICD-10-CM | POA: Diagnosis not present

## 2022-05-07 ENCOUNTER — Other Ambulatory Visit: Payer: Self-pay

## 2022-05-07 ENCOUNTER — Ambulatory Visit (HOSPITAL_COMMUNITY): Payer: Medicare HMO

## 2022-05-07 ENCOUNTER — Telehealth: Payer: Self-pay

## 2022-05-07 DIAGNOSIS — C189 Malignant neoplasm of colon, unspecified: Secondary | ICD-10-CM

## 2022-05-07 NOTE — Telephone Encounter (Signed)
Pt and spouse called stating that the pt was seen on 05/06/2022 by their PCP for uncontrollable cough.  Pt stated the PCP did numerous test on pt.  Pt was negative for COVID, Flu, and fever.  PCP stated that she heard something in the pt's RLL and dx pt with pneumonia.  PCP prescribed Cefdinir and an Albuterol Inhaler.  Pt stated he is very fatigue and is experiencing severe coughing while lying down (flat on his back) and when standing up from a sitting position.  Pt stated his coughing episodes last up to 20 minutes sometimes.  Pt tested O2 Sats while on the phone with this RN and pt is saturating between 95% to 96% on room air while sitting.  Pt's HR was between 98 - 100.  Pt and spouse stated they were taking the Mucinex DM Max that Dr. Burr Medico had recommended the last time they were in the clinic but it didn't seem to have been working; therefore, they followed up with the pt's PCP.  Pt and spouse had concerns because the PCP did not do a CXR nor give the pt a steroid injection which the pt have gotten in the past.  Informed both pt and spouse that the Albuterol Inhaler is a steroid.  Pt and spouse stated when the pt uses the inhaler the pt almost chokes and begins to cough more.  Based off conversation with pt and spouse, the pt needs further education on how to properly use the prescribed inhaler.  Recommended that they get a spacer from the pharmacy that filled the prescription.  Pt and spouse verbalized understanding.  Recommended that the pt continue to drink thin liquids to assist with breaking up the inflammation within the pt's chest.  Pt stated he's had small amounts of sputum which are cream to light yellow in color.  Pt stated he's not sure if he can lay on his back long enough to have the CT Scan Dr. Burr Medico ordered (scheduled for 05/11/2022) done.  Spoke with Dr. Burr Medico regarding pt's call.  Dr. Burr Medico gave verbal order for CXR and for pt to come in on Monday to be seen by Eloise Harman. PA-C in [facility].  Pt notified  of the plan to come into [facility] on Saturday or Sunday to have the CXR completed and be seen by [facility] on Monday.  Pt is in agreement with plan.  Notified Dr. Burr Medico and Eloise Harman. PA-C.

## 2022-05-08 ENCOUNTER — Ambulatory Visit (HOSPITAL_COMMUNITY)
Admission: RE | Admit: 2022-05-08 | Discharge: 2022-05-08 | Disposition: A | Discharge status: Home / Self Care | Payer: Medicare HMO | Source: Ambulatory Visit | Attending: Hematology | Admitting: Hematology

## 2022-05-08 DIAGNOSIS — R911 Solitary pulmonary nodule: Secondary | ICD-10-CM | POA: Diagnosis not present

## 2022-05-08 DIAGNOSIS — C7800 Secondary malignant neoplasm of unspecified lung: Secondary | ICD-10-CM | POA: Diagnosis not present

## 2022-05-08 DIAGNOSIS — C189 Malignant neoplasm of colon, unspecified: Secondary | ICD-10-CM

## 2022-05-08 DIAGNOSIS — R0602 Shortness of breath: Secondary | ICD-10-CM | POA: Diagnosis not present

## 2022-05-10 ENCOUNTER — Inpatient Hospital Stay (HOSPITAL_BASED_OUTPATIENT_CLINIC_OR_DEPARTMENT_OTHER): Payer: Medicare HMO | Admitting: Physician Assistant

## 2022-05-10 ENCOUNTER — Inpatient Hospital Stay: Payer: Medicare HMO

## 2022-05-10 ENCOUNTER — Other Ambulatory Visit: Payer: Self-pay | Admitting: Hematology

## 2022-05-10 VITALS — BP 135/87 | HR 73 | Temp 98.0°F | Resp 16 | Wt 241.1 lb

## 2022-05-10 DIAGNOSIS — R059 Cough, unspecified: Secondary | ICD-10-CM | POA: Diagnosis not present

## 2022-05-10 DIAGNOSIS — E860 Dehydration: Secondary | ICD-10-CM

## 2022-05-10 DIAGNOSIS — Z5111 Encounter for antineoplastic chemotherapy: Secondary | ICD-10-CM | POA: Diagnosis not present

## 2022-05-10 DIAGNOSIS — T50995A Adverse effect of other drugs, medicaments and biological substances, initial encounter: Secondary | ICD-10-CM | POA: Diagnosis not present

## 2022-05-10 DIAGNOSIS — I110 Hypertensive heart disease with heart failure: Secondary | ICD-10-CM | POA: Diagnosis not present

## 2022-05-10 DIAGNOSIS — Z452 Encounter for adjustment and management of vascular access device: Secondary | ICD-10-CM | POA: Diagnosis not present

## 2022-05-10 DIAGNOSIS — C189 Malignant neoplasm of colon, unspecified: Secondary | ICD-10-CM | POA: Diagnosis not present

## 2022-05-10 DIAGNOSIS — C182 Malignant neoplasm of ascending colon: Secondary | ICD-10-CM

## 2022-05-10 DIAGNOSIS — Z5112 Encounter for antineoplastic immunotherapy: Secondary | ICD-10-CM | POA: Diagnosis not present

## 2022-05-10 DIAGNOSIS — R600 Localized edema: Secondary | ICD-10-CM | POA: Diagnosis not present

## 2022-05-10 DIAGNOSIS — J189 Pneumonia, unspecified organism: Secondary | ICD-10-CM

## 2022-05-10 DIAGNOSIS — C7800 Secondary malignant neoplasm of unspecified lung: Secondary | ICD-10-CM

## 2022-05-10 DIAGNOSIS — L509 Urticaria, unspecified: Secondary | ICD-10-CM | POA: Diagnosis not present

## 2022-05-10 DIAGNOSIS — R21 Rash and other nonspecific skin eruption: Secondary | ICD-10-CM | POA: Diagnosis not present

## 2022-05-10 DIAGNOSIS — C184 Malignant neoplasm of transverse colon: Secondary | ICD-10-CM | POA: Diagnosis not present

## 2022-05-10 DIAGNOSIS — I509 Heart failure, unspecified: Secondary | ICD-10-CM | POA: Diagnosis not present

## 2022-05-10 DIAGNOSIS — R197 Diarrhea, unspecified: Secondary | ICD-10-CM | POA: Diagnosis not present

## 2022-05-10 LAB — CMP (CANCER CENTER ONLY)
ALT: 17 U/L (ref 0–44)
AST: 23 U/L (ref 15–41)
Albumin: 3.5 g/dL (ref 3.5–5.0)
Alkaline Phosphatase: 61 U/L (ref 38–126)
Anion gap: 6 (ref 5–15)
BUN: 14 mg/dL (ref 8–23)
CO2: 28 mmol/L (ref 22–32)
Calcium: 8.6 mg/dL — ABNORMAL LOW (ref 8.9–10.3)
Chloride: 103 mmol/L (ref 98–111)
Creatinine: 1.03 mg/dL (ref 0.61–1.24)
GFR, Estimated: >60 mL/min (ref >60)
Glucose, Bld: 102 mg/dL — ABNORMAL HIGH (ref 70–99)
Potassium: 3.8 mmol/L (ref 3.5–5.1)
Sodium: 137 mmol/L (ref 135–145)
Total Bilirubin: 0.9 mg/dL (ref 0.3–1.2)
Total Protein: 6.7 g/dL (ref 6.5–8.1)

## 2022-05-10 LAB — CBC WITH DIFFERENTIAL (CANCER CENTER ONLY)
Abs Immature Granulocytes: 0.03 10*3/uL (ref 0.00–0.07)
Basophils Absolute: 0.1 10*3/uL (ref 0.0–0.1)
Basophils Relative: 1 %
Eosinophils Absolute: 0.1 10*3/uL (ref 0.0–0.5)
Eosinophils Relative: 1 %
HCT: 44.7 % (ref 39.0–52.0)
Hemoglobin: 15.5 g/dL (ref 13.0–17.0)
Immature Granulocytes: 0 %
Lymphocytes Relative: 22 %
Lymphs Abs: 1.6 10*3/uL (ref 0.7–4.0)
MCH: 32.3 pg (ref 26.0–34.0)
MCHC: 34.7 g/dL (ref 30.0–36.0)
MCV: 93.1 fL (ref 80.0–100.0)
Monocytes Absolute: 1.2 10*3/uL — ABNORMAL HIGH (ref 0.1–1.0)
Monocytes Relative: 17 %
Neutro Abs: 4.2 10*3/uL (ref 1.7–7.7)
Neutrophils Relative %: 59 %
Platelet Count: 119 10*3/uL — ABNORMAL LOW (ref 150–400)
RBC: 4.8 MIL/uL (ref 4.22–5.81)
RDW: 17.1 % — ABNORMAL HIGH (ref 11.5–15.5)
WBC Count: 7.2 10*3/uL (ref 4.0–10.5)
nRBC: 0 % (ref 0.0–0.2)

## 2022-05-10 LAB — BRAIN NATRIURETIC PEPTIDE: B Natriuretic Peptide: 76.7 pg/mL (ref 0.0–100.0)

## 2022-05-10 MED ORDER — SODIUM CHLORIDE 0.9% FLUSH
10.0000 mL | Freq: Once | INTRAVENOUS | Status: AC | PRN
  Administered 2022-05-10: 10 mL

## 2022-05-10 MED ORDER — FUROSEMIDE 20 MG PO TABS: 20.0000 mg | ORAL_TABLET | Freq: Every day | ORAL | 0 refills | Status: DC

## 2022-05-10 MED ORDER — POTASSIUM CHLORIDE CRYS ER 20 MEQ PO TBCR: 20.0000 meq | EXTENDED_RELEASE_TABLET | Freq: Every day | ORAL | 0 refills | Status: DC

## 2022-05-10 MED ORDER — FUROSEMIDE 10 MG/ML IJ SOLN
20.0000 mg | Freq: Once | INTRAMUSCULAR | Status: AC
  Administered 2022-05-10: 20 mg via INTRAMUSCULAR
  Filled 2022-05-10: qty 2

## 2022-05-10 MED ORDER — HEPARIN SOD (PORK) LOCK FLUSH 100 UNIT/ML IV SOLN
500.0000 [IU] | Freq: Once | INTRAVENOUS | Status: AC | PRN
  Administered 2022-05-10: 500 [IU]

## 2022-05-10 NOTE — Progress Notes (Signed)
Symptom Management Consult note [name]    Patient Care Team: Angelina Sheriff, MD as PCP - General (Family Medicine) Berniece Salines, DO as PCP - Cardiology (Cardiology) Truitt Merle, MD as Attending Physician (Hematology and Oncology)    Name of the patient: Thomas Knight  [id number]  [birth year]   Date of visit: 05/10/2022   Chief Complaint/Reason for visit: cough   Current Therapy: FOLFOX, q14d, starting 03/03/22 -Bevacizumab added with C2 (9/20)  Last treatment:  Day 1   Cycle 5 on 04/28/22   ASSESSMENT & PLAN: Patient is a 76 y.o. male  with oncologic history of right side colon cancer stage II (pT3, N0), metastatic disease to lung and possible nodes in July 2023, MSS, KRAS/NRAS/BRAF wild type followed by Dr. Burr Medico.  I have viewed most recent oncology note and lab work.   #) Cough -Patient started cefdinir for presumed pneumonia x4 days ago and for a 10-day course from PCP. -CXR from x 2 days ago shows no acute infectious process. Does have opacity in R lung base that radiologist believed favored atelectasis. I viewed image and agree with impression -Cough has improved over the last 3 days consistent with his antibiotic use most likely. -On exam patient has rhonchi in right lower lobe which correlates with pneumonia rather than atelectasis.  I discussed x-ray findings with patient and agreed with the plan to continue cefdinir.  Patient has been using a cough expectorant which I recommended to discontinue and try a cough suppressant such as Delsym instead. -CBC today without leukocytosis which is expected as he is taking antibiotic currently. -Patient ambulated in clinic without desaturation.  He is stable for outpatient management of pneumonia at this time. -Discussed ED precautions if symptoms should worsen.  #) Bilateral lower extremity edema -Patient has 1+ pitting edema.  No calf tenderness or palpable cord making DVT less likely. -Patient does have  history of CHF so BNP was checked and is within normal limits.Weight is down today compared to last visit. -No pleural effusion seen on CXR. -Patient given a dose of IV Lasix here in clinic and will send short course for him to continue at home starting tomorrow. -In the past when patient has taken Lasix he was prescribed potassium.  CMP today shows potassium of 3.8, so I will prescribe a low-dose 20 mg once daily to be taken with Lasix.  #) Right side colon cancer stage II (pT3, N0), metastatic disease to lung and possible nodes in July 2023, MSS, KRAS/NRAS/BRAF wild type   -Has restaging CT scheduled for tomorrow and next appointment with oncologist is 05/12/22 -Discussed patient with Dr. Burr Medico who agrees that as he is currently symptomatic and being treated for presumed pneumonia we should delay his imaging and treatment 2 weeks.  Patient is agreeable with this plan.  RN sent scheduling message for treatment changes.    Heme/Onc History: Oncology History  Cancer of right colon ([place])  10/2020 Procedure   Colonoscopy-Dr. Benson Norway     11/06/2020 Imaging   CT CHEST ABDOMEN PELVIS W CONTRAST   IMPRESSION: 1. 4.6 cm partially circumferential apple-core type neoplastic process involving the mid transverse colon. No findings for locoregional adenopathy or distant metastatic disease. 2. Enlarged prostate gland with median lobe hypertrophy impressing on the base of the bladder. 3. 16 mm left thyroid nodule. Recommend follow-up thyroid ultrasound examination.   01/09/2021 Initial Diagnosis   Colon cancer ([place])   01/09/2021 Pathology Results   B. COLON,  RIGHT, RESECTION:  -  Adenocarcinoma, moderately differentiated, 3.0 cm  -  No carcinoma identified in seventeen lymph nodes (0/17)  -  Margins uninvolved by carcinoma  -  Tubular adenoma (x2)  -  Benign appendix  -  See oncology table and comment below   Margin Status for Invasive Carcinoma: All margins negative for  invasive carcinoma        Distance from Invasive Carcinoma to Radial (Circumferential)       Margin Status for Non-Invasive Tumor: All margins negative for  high-grade dysplasia / intramucosal carcinoma and low-grade dysplasia  Regional Lymph Nodes:       Number of Lymph Nodes with Tumor: 0       Number of Lymph Nodes Examined: 17  Tumor Deposits: 0  Distant Metastasis:       Distant Site(s) Involved: Not applicable  Pathologic Stage Classification (pTNM, AJCC 8th Edition): pT3, pN0   MMR Stable    01/09/2021 Cancer Staging   Staging form: Colon and Rectum, AJCC 8th Edition - Pathologic stage from 01/09/2021: Stage IIA (pT3, pN0, cM0) - Signed by Truitt Merle, MD on 02/12/2022 Total positive nodes: 0 Histologic grading system: 4 grade system Histologic grade (G): G2 Residual tumor (R): R0 - None   05/2021 Tumor Marker   Patient's tumor was tested for the following markers: CEA. Results of the tumor marker test revealed 2.1.   12/11/2021 Procedure   Colonoscopy-Dr. Benson Norway  There was evidence of a prior functional end-to-end ileo-colonic anastomosis in the transverse colon. This was patent and was characterized by healthy appearing mucosa. The anastomosis was traversed. Findings: Scattered small and large-mouthed diverticula were found in the sigmoid colon. - Patent functional end-to-end ileo-colonic anastomosis, characterized by healthy appearing mucosa. - Diverticulosis in the sigmoid colon. - No specimens collected.   01/25/2022 Imaging   CT CHEST ABDOMEN PELVIS W CONTRAST   IMPRESSION: 1. New right-sided pulmonary nodules measure up to 11 mm, nonspecific but concerning for metastatic disease. Consider further evaluation with nuclear medicine PET/CT. 2. New nodularity/lymph nodes in the central mesentery, concerning for nodal disease involvement. 3. Slightly increased size of prominent retroperitoneal lymph nodes, nonspecific but also somewhat concerning for nodal disease involvement. 4. Surgical  change of partial colectomy without evidence of local recurrence. 5. Stable prominent mediastinal lymph nodes, nonspecific but stability is reassuring. Attention on follow-up imaging suggested. 6. Stable prominent right external iliac lymph node, nonspecific but stability is reassuring. Attention on follow-up imaging suggested. 7. Stable hypodense 8 mm hepatic lesion technically too small to accurately characterize but stability is reassuring. Attention on follow-up imaging suggested. 8. 1.9 cm incidental left thyroid nodule. Recommend thyroid US. Reference: J Am Coll Radiol. 2015 Feb;12(2): 143-50 9.  Aortic Atherosclerosis (ICD10-I70.0).   02/09/2022 Procedure   Bronchoscopy under the care of Dr. Valeta Harms    02/09/2022 Pathology Results   FINAL MICROSCOPIC DIAGNOSIS:   A. LUNG, RUL, FINE NEEDLE ASPIRATION  BIOPSY FORCEPS:  Adenocarcinoma with morphologic features consistent with metastasis from a colorectal primary.  Please see comment.   Comment: The following immunostains are performed with appropriate controls :  TTF-1: Negative .  Napsin A: Negative .  CK7: Negative.  CK20: Positive.  CDX2: Positive .   The above immuno histochemical profile is supportive of the rendered diagnosis.    Colon cancer metastasized to lung [facility])  02/09/2022 Pathology Results   FINAL MICROSCOPIC DIAGNOSIS:   A. LUNG, RUL, FINE NEEDLE ASPIRATION  BIOPSY FORCEPS:  Adenocarcinoma with morphologic features consistent with  metastasis from a colorectal primary.  Please see comment.   Comment: The following immunostains are performed with appropriate controls :  TTF-1: Negative .  Napsin A: Negative .  CK7: Negative.  CK20: Positive.  CDX2: Positive .   The above immuno histochemical profile is supportive of the rendered diagnosis.    02/12/2022 Initial Diagnosis   Colon cancer metastasized to lung [facility])   03/03/2022 -  Chemotherapy   Patient is on Treatment Plan : COLORECTAL FOLFOX + Bevacizumab  q14d         Interval history-: Dona Klemann is a 76 y.o. male with oncologic history as above presenting to [facility] today with chief complaint of cough x10 days.  Patient is accompanied by his spouse who provides additional history. Per patient he saw PCP 05/06/22 for cough and there was concern for pneumonia heard in RLL so he was prescribed cefdinir and albuterol inhaler. He had negative COVID and flu testing. He has also been using Mucinex as recommended by Dr. Burr Medico at 11/01 office visit for his reported dry cough.  Patient reports the cough is still nonproductive although sounds "wet" at times.  Over the last 3 days his cough has started to improve.  He has now been taking the cefdinir for total of x4 days.  He is still taking the Mucinex as well.  Patient states after coughing he will occasionally hear wheezing and feels short of breath after a coughing fit.  He denies any shortness of breath with exertion however.  Patient denies any sick contacts or known COVID exposures.  He has been sleeping in a recliner during course of illness as he has worsening cough when lying flat.  Patient is scheduled to have a restaging CT scan tomorrow and is very nervous about laying flat on the table.  He and spouse do not think he will be able to lay still for the imaging. Patient is also endorsing swelling in his ankles.  He states it has worsened in the last 48 hours.  He has a history of CHF and has had leg swelling in the past.  He has taken Lasix intermittently in the past for lower extremity edema though none recently.  Patient states when he elevates his legs for long periods of time he notices that the swelling improves.  He denies any fall, injury or trauma to his lower extremities.  Patient denies any fever, chills, sore throat, chest pain, back pain, myalgias, arthralgias.    ROS  All other systems are reviewed and are negative for acute change except as noted in the HPI.    Allergies  Allergen  Reactions   Tramadol Nausea Only and Other (See Comments)    Extreme hiccups     Past Medical History:  Diagnosis Date   Bilateral leg edema 08/24/2019   Cancer ([place])    skin ca, colon   Chronic cough    Chronic pain of left knee 12/01/2015   COVID-19    06/2019   Dysrhythmia    afib  was cardioverted   Essential hypertension 08/24/2019   Mixed hyperlipidemia 08/24/2019   Obesity (BMI 30-39.9) 08/24/2019   Persistent atrial fibrillation ([place]) 08/24/2019     Past Surgical History:  Procedure Laterality Date   ACHILLES TENDON REPAIR Left 2012   Ruptured   BRONCHIAL BIOPSY  02/09/2022   Procedure: BRONCHIAL BIOPSIES;  Surgeon: Garner Nash, DO;  Location: [place] ENDOSCOPY;  Service: Pulmonary;;   BRONCHIAL NEEDLE ASPIRATION BIOPSY  02/09/2022  Procedure: BRONCHIAL NEEDLE ASPIRATION BIOPSIES;  Surgeon: Garner Nash, DO;  Location: [place] ENDOSCOPY;  Service: Pulmonary;;   COLONOSCOPY WITH PROPOFOL N/A 12/11/2021   Procedure: COLONOSCOPY WITH PROPOFOL;  Surgeon: Carol Ada, MD;  Location: [facility] ENDOSCOPY;  Service: Gastroenterology;  Laterality: N/A;   HERNIA REPAIR     umbilical hernia   IR IMAGING GUIDED PORT INSERTION  02/18/2022   SKIN SURGERY Left    Basal cell carcinoma on left forearm   XI ROBOTIC ASSISTED LOWER ANTERIOR RESECTION N/A 01/09/2021   Procedure: XI ROBOTIC ASSISTED RIGHT HEMI COLECTOMY, LYSIS OF ADHESIONS, SMALL BOWEL RESECTION, INTRAOPERATIVE ASSESMENT OF PERFUSION, PARTIAL OMENTECTOMY;  Surgeon: Ileana Roup, MD;  Location: [facility] ORS;  Service: General;  Laterality: N/A;    Social History   Socioeconomic History   Marital status: Married    Spouse name: Not on file   Number of children: Not on file   Years of education: Not on file   Highest education level: Not on file  Occupational History   Not on file  Tobacco Use   Smoking status: Never   Smokeless tobacco: Never  Vaping Use   Vaping Use: Never used  Substance and Sexual Activity    Alcohol use: Never   Drug use: Never   Sexual activity: Not Currently  Other Topics Concern   Not on file  Social History Narrative   Not on file   Social Determinants of Health   Financial Resource Strain: Not on file  Food Insecurity: Not on file  Transportation Needs: Not on file  Physical Activity: Not on file  Stress: Not on file  Social Connections: Not on file  Intimate Partner Violence: Not on file    Family History  Problem Relation Age of Onset   Lung cancer Father      Current Outpatient Medications:    furosemide (LASIX) 20 MG tablet, Take 1 tablet (20 mg total) by mouth daily for 5 days., Disp: 5 tablet, Rfl: 0   potassium chloride SA (KLOR-CON M) 20 MEQ tablet, Take 1 tablet (20 mEq total) by mouth daily for 5 days., Disp: 5 tablet, Rfl: 0   acetaminophen (TYLENOL) 500 MG tablet, Take 500-1,000 mg by mouth every 6 (six) hours as needed (pain.)., Disp: , Rfl:    amLODipine (NORVASC) 10 MG tablet, Take 1 tablet (10 mg total) by mouth daily., Disp: 30 tablet, Rfl: 1   Ascorbic Acid (VITAMIN C WITH ROSE HIPS) 500 MG tablet, Take 500 mg by mouth every evening., Disp: , Rfl:    Cholecalciferol (D3-1000) 25 MCG (1000 UT) capsule, Take 1,000 Units by mouth every evening., Disp: , Rfl:    ELIQUIS 5 MG TABS tablet, Take 1 tablet (5 mg total) by mouth 2 (two) times daily., Disp: 180 tablet, Rfl: 1   lidocaine-prilocaine (EMLA) cream, Apply 1 Application topically as needed., Disp: 30 g, Rfl: 2   lidocaine-prilocaine (EMLA) cream, Apply to affected area once, Disp: 30 g, Rfl: 3   metoprolol tartrate (LOPRESSOR) 25 MG tablet, Take 1 tablet (25 mg total) by mouth 2 (two) times daily., Disp: 180 tablet, Rfl: 3   Misc Natural Products (JOINT SUPPORT PO), Take 1 tablet by mouth daily. Instaflex Advanced Joint Support, Disp: , Rfl:    nitroGLYCERIN (NITROSTAT) 0.4 MG SL tablet, Place 1 tablet (0.4 mg total) under the tongue every 5 (five) minutes as needed., Disp: 25 tablet, Rfl: 7    ondansetron (ZOFRAN) 8 MG tablet, Take 1 tablet every 8 hours as  needed for nausea/vomiting starting on 3 days after chemotherapy, Disp: 30 tablet, Rfl: 2   ondansetron (ZOFRAN) 8 MG tablet, Take 1 tablet (8 mg total) by mouth every 8 (eight) hours as needed for nausea or vomiting. Start on the third day after chemotherapy., Disp: 30 tablet, Rfl: 1   prochlorperazine (COMPAZINE) 10 MG tablet, Take 1 tablet (10 mg total) by mouth every 6 (six) hours as needed., Disp: 30 tablet, Rfl: 2   prochlorperazine (COMPAZINE) 10 MG tablet, Take 1 tablet (10 mg total) by mouth every 6 (six) hours as needed for nausea or vomiting., Disp: 30 tablet, Rfl: 1   vitamin B-12 (CYANOCOBALAMIN) 1000 MCG tablet, Take 1,000 mcg by mouth every evening., Disp: , Rfl:    Zinc 50 MG CAPS, Take 50 mg by mouth every evening., Disp: , Rfl:   PHYSICAL EXAM: ECOG FS:1 - Symptomatic but completely ambulatory    Vitals:   05/10/22 1041 05/10/22 1045  BP:  135/87  Pulse:  73  Resp:  16  Temp:  98 F (36.7 C)  SpO2:  96%  Weight: 241 lb 1.6 oz (109.4 kg)    Physical Exam Vitals and nursing note reviewed.  Constitutional:      Appearance: He is well-developed. He is not ill-appearing or toxic-appearing.  HENT:     Head: Normocephalic.     Nose: Nose normal.     Mouth/Throat:     Mouth: Mucous membranes are moist.  Eyes:     Conjunctiva/sclera: Conjunctivae normal.  Neck:     Vascular: No JVD.  Cardiovascular:     Rate and Rhythm: Normal rate. Rhythm irregular.     Heart sounds: Normal heart sounds.  Pulmonary:     Effort: Pulmonary effort is normal.     Comments: Rhonchi heard in posterior lung base. Has dry hacking cough during majority of exam Patient talking in full sentences when not coughing. Chest:     Comments: Port without signs of infection Abdominal:     General: There is no distension.  Musculoskeletal:        General: Normal range of motion.     Cervical back: Normal range of motion.      Right lower leg: 1+ Pitting Edema present.     Left lower leg: 1+ Pitting Edema present.     Comments: No calf tenderness, no palpable cords, compartments are soft bilaterally    Skin:    General: Skin is warm and dry.     Comments: Equal tactile temperature in BLE. No open wounds   Neurological:     Mental Status: He is oriented to person, place, and time.        LABORATORY DATA: I have reviewed the data as listed    Latest Ref Rng & Units 05/10/2022   10:15 AM 04/28/2022   10:16 AM 04/13/2022    9:24 AM  CBC  WBC 4.0 - 10.5 K/uL 7.2  5.7  5.7   Hemoglobin 13.0 - 17.0 g/dL 15.5  15.6  15.0   Hematocrit 39.0 - 52.0 % 44.7  45.1  43.1   Platelets 150 - 400 K/uL 119  143  108         Latest Ref Rng & Units 05/10/2022   10:15 AM 04/28/2022   10:16 AM 04/13/2022    9:24 AM  CMP  Glucose 70 - 99 mg/dL 102  97  92   BUN 8 - 23 mg/dL _0 Creatinine  0.61 - 1.24 mg/dL 1.03  0.89  0.93   Sodium 135 - 145 mmol/L 137  142  141   Potassium 3.5 - 5.1 mmol/L 3.8  3.6  3.5   Chloride 98 - 111 mmol/L 103  107  109   CO2 22 - 32 mmol/L _0 Calcium 8.9 - 10.3 mg/dL 8.6  8.8  8.4   Total Protein 6.5 - 8.1 g/dL 6.7  6.7  6.2   Total Bilirubin 0.3 - 1.2 mg/dL 0.9  0.6  0.6   Alkaline Phos 38 - 126 U/L 61  60  58   AST 15 - 41 U/L _1 ALT 0 - 44 U/L _2 RADIOGRAPHIC STUDIES (from last 24 hours if applicable) I have personally reviewed the radiological images as listed and agreed with the findings in the report. No results found.      Visit Diagnosis: 1. Colon cancer metastasized to lung ([place])   2. Pneumonia of right lower lobe due to infectious organism   3. Bilateral lower extremity edema      No orders of the defined types were placed in this encounter.   All questions were answered. The patient knows to call the clinic with any problems, questions or concerns. No barriers to learning was detected.  I have spent a total of 30  minutes minutes of face-to-face and non-face-to-face time, preparing to see the patient, obtaining and/or reviewing separately obtained history, performing a medically appropriate examination, counseling and educating the patient, ordering tests, documenting clinical information in the electronic health record, and care coordination (communications with other health care professionals or caregivers).    Thank you for allowing me to participate in the care of this patient.    Barrie Folk, PA-C Department of Hematology/Oncology [facility] Phone: [phone]  [phone]    05/10/2022 4:58 PM

## 2022-05-11 ENCOUNTER — Ambulatory Visit (HOSPITAL_COMMUNITY): Payer: Medicare HMO

## 2022-05-11 ENCOUNTER — Other Ambulatory Visit: Payer: Self-pay | Admitting: Hematology

## 2022-05-11 ENCOUNTER — Telehealth: Payer: Self-pay

## 2022-05-11 MED ORDER — SPIRONOLACTONE 50 MG PO TABS: 50.0000 mg | ORAL_TABLET | Freq: Every day | ORAL | 0 refills | Status: DC

## 2022-05-11 MED FILL — Dexamethasone Sodium Phosphate Inj 100 MG/10ML: INTRAMUSCULAR | Qty: 1 | Status: AC

## 2022-05-11 NOTE — Telephone Encounter (Signed)
Spoke with pt regarding his on-call telephone call to the [facility].  Pt stated he went to urgent care in [place] regarding his reaction to Lasix.  Pt stated the doctor at the urgent care told him he was having a Sulfa Base Reaction to the Lasix.  Pt was given IV Diphenhydramine and Steroid while at the urgent care.  Pt stated he was prescribed Lasix & potasium on 05/10/2022 and given IV Lasix while in [facility].  Instructed pt to stop taking both Lasix and Potassium.  Pt was told by urgent care provider to take Diphenhydramine for the next couple of days and to ask oncology provider for a steroid to take for the next few days.  Instructed pt to take famotidine and if the Diphenhydramine makes the pt sleepy he can take Claritin for the next few days.  Pt stated he's have hiccups which is probably related to the IV Steroid.  Instructed pt to take Prochlorperazine for the hiccups.  Pt verbalized understanding of instructions.  Spoke with Dr. Burr Medico regarding pt's BLE and new allergy.  Dr. Burr Medico prescribed Spironolactone 50mg  but does not feel the pt require additional steroids at this time.  Spoke with pt to inform pt that Dr. Burr Medico prescribed a new diuretic, to not take the PO K+, and does not feel that additional steroids are needed at this time.

## 2022-05-12 ENCOUNTER — Inpatient Hospital Stay: Payer: Medicare HMO | Admitting: Nurse Practitioner

## 2022-05-12 ENCOUNTER — Telehealth: Payer: Self-pay

## 2022-05-12 ENCOUNTER — Inpatient Hospital Stay: Payer: Medicare HMO

## 2022-05-12 NOTE — Telephone Encounter (Signed)
Erroneous entry

## 2022-05-12 NOTE — Telephone Encounter (Signed)
Spoke with patient and relayed message from Cira Rue NP to decrease Amilodipine from 10 mg to 5 mg. Patient voiced understanding.

## 2022-05-12 NOTE — Progress Notes (Deleted)
[place]   Telephone:(336) [phone] Fax:(336) [phone]   Clinic Follow up Note   Patient Care Team: Angelina Sheriff, MD as PCP - General (Family Medicine) Berniece Salines, DO as PCP - Cardiology (Cardiology) Truitt Merle, MD as Attending Physician (Hematology and Oncology) 05/12/2022  CHIEF COMPLAINT: Follow up metastatic colon cancer   SUMMARY OF ONCOLOGIC HISTORY: Oncology History  Cancer of right colon ([place])  10/2020 Procedure   Colonoscopy-Dr. Benson Norway     11/06/2020 Imaging   CT CHEST ABDOMEN PELVIS W CONTRAST   IMPRESSION: 1. 4.6 cm partially circumferential apple-core type neoplastic process involving the mid transverse colon. No findings for locoregional adenopathy or distant metastatic disease. 2. Enlarged prostate gland with median lobe hypertrophy impressing on the base of the bladder. 3. 16 mm left thyroid nodule. Recommend follow-up thyroid ultrasound examination.   01/09/2021 Initial Diagnosis   Colon cancer ([place])   01/09/2021 Pathology Results   B. COLON, RIGHT, RESECTION:  -  Adenocarcinoma, moderately differentiated, 3.0 cm  -  No carcinoma identified in seventeen lymph nodes (0/17)  -  Margins uninvolved by carcinoma  -  Tubular adenoma (x2)  -  Benign appendix  -  See oncology table and comment below   Margin Status for Invasive Carcinoma: All margins negative for  invasive carcinoma       Distance from Invasive Carcinoma to Radial (Circumferential)       Margin Status for Non-Invasive Tumor: All margins negative for  high-grade dysplasia / intramucosal carcinoma and low-grade dysplasia  Regional Lymph Nodes:       Number of Lymph Nodes with Tumor: 0       Number of Lymph Nodes Examined: 17  Tumor Deposits: 0  Distant Metastasis:       Distant Site(s) Involved: Not applicable  Pathologic Stage Classification (pTNM, AJCC 8th Edition): pT3, pN0   MMR Stable    01/09/2021 Cancer Staging   Staging form: Colon and Rectum, AJCC 8th  Edition - Pathologic stage from 01/09/2021: Stage IIA (pT3, pN0, cM0) - Signed by Truitt Merle, MD on 02/12/2022 Total positive nodes: 0 Histologic grading system: 4 grade system Histologic grade (G): G2 Residual tumor (R): R0 - None   05/2021 Tumor Marker   Patient's tumor was tested for the following markers: CEA. Results of the tumor marker test revealed 2.1.   12/11/2021 Procedure   Colonoscopy-Dr. Benson Norway  There was evidence of a prior functional end-to-end ileo-colonic anastomosis in the transverse colon. This was patent and was characterized by healthy appearing mucosa. The anastomosis was traversed. Findings: Scattered small and large-mouthed diverticula were found in the sigmoid colon. - Patent functional end-to-end ileo-colonic anastomosis, characterized by healthy appearing mucosa. - Diverticulosis in the sigmoid colon. - No specimens collected.   01/25/2022 Imaging   CT CHEST ABDOMEN PELVIS W CONTRAST   IMPRESSION: 1. New right-sided pulmonary nodules measure up to 11 mm, nonspecific but concerning for metastatic disease. Consider further evaluation with nuclear medicine PET/CT. 2. New nodularity/lymph nodes in the central mesentery, concerning for nodal disease involvement. 3. Slightly increased size of prominent retroperitoneal lymph nodes, nonspecific but also somewhat concerning for nodal disease involvement. 4. Surgical change of partial colectomy without evidence of local recurrence. 5. Stable prominent mediastinal lymph nodes, nonspecific but stability is reassuring. Attention on follow-up imaging suggested. 6. Stable prominent right external iliac lymph node, nonspecific but stability is reassuring. Attention on follow-up imaging suggested. 7. Stable hypodense 8 mm hepatic lesion technically too small to accurately characterize  but stability is reassuring. Attention on follow-up imaging suggested. 8. 1.9 cm incidental left thyroid nodule. Recommend thyroid  US. Reference: J Am Coll Radiol. 2015 Feb;12(2): 143-50 9.  Aortic Atherosclerosis (ICD10-I70.0).   02/09/2022 Procedure   Bronchoscopy under the care of Dr. Valeta Harms    02/09/2022 Pathology Results   FINAL MICROSCOPIC DIAGNOSIS:   A. LUNG, RUL, FINE NEEDLE ASPIRATION  BIOPSY FORCEPS:  Adenocarcinoma with morphologic features consistent with metastasis from a colorectal primary.  Please see comment.   Comment: The following immunostains are performed with appropriate controls :  TTF-1: Negative .  Napsin A: Negative .  CK7: Negative.  CK20: Positive.  CDX2: Positive .   The above immuno histochemical profile is supportive of the rendered diagnosis.    Colon cancer metastasized to lung [facility])  02/09/2022 Pathology Results   FINAL MICROSCOPIC DIAGNOSIS:   A. LUNG, RUL, FINE NEEDLE ASPIRATION  BIOPSY FORCEPS:  Adenocarcinoma with morphologic features consistent with metastasis from a colorectal primary.  Please see comment.   Comment: The following immunostains are performed with appropriate controls :  TTF-1: Negative .  Napsin A: Negative .  CK7: Negative.  CK20: Positive.  CDX2: Positive .   The above immuno histochemical profile is supportive of the rendered diagnosis.    02/12/2022 Initial Diagnosis   Colon cancer metastasized to lung ([place])   03/03/2022 -  Chemotherapy   Patient is on Treatment Plan : COLORECTAL FOLFOX + Bevacizumab q14d       CURRENT THERAPY:  FOLFOX, q14d, starting 03/03/22 -Bevacizumab added with C2 (9/20)   INTERVAL HISTORY: Thomas Knight returns for follow up as scheduled. Last seen by Dr. Burr Medico 04/28/22 for cycle 5 FOLFOX/beva and amlodipine was increased to 10 mg. He developed fatigue and cough, seen by PCP who ruled out covid/flu and he was given cefdinir and albuterol. He came in to [facility] 05/10/22 and we ruled out CHF exacerbation, and no pleural effusion on CXR. He was given a dose of IV lasix and began po lasix. On 05/11/22 he called reporting that he  went to urgent care in [facility] for what was felt to be a sulfa based reaction to lasix, he was treated with supportive care. He also had hiccups and bilateral lower leg swelling. Lasix was changed to aldactone.    REVIEW OF SYSTEMS:   Constitutional: Denies fevers, chills or abnormal weight loss Eyes: Denies blurriness of vision Ears, nose, mouth, throat, and face: Denies mucositis or sore throat Respiratory: Denies cough, dyspnea or wheezes Cardiovascular: Denies palpitation, chest discomfort or lower extremity swelling Gastrointestinal:  Denies nausea, heartburn or change in bowel habits Skin: Denies abnormal skin rashes Lymphatics: Denies new lymphadenopathy or easy bruising Neurological:Denies numbness, tingling or new weaknesses Behavioral/Psych: Mood is stable, no new changes  All other systems were reviewed with the patient and are negative.  MEDICAL HISTORY:  Past Medical History:  Diagnosis Date   Bilateral leg edema 08/24/2019   Cancer ([place])    skin ca, colon   Chronic cough    Chronic pain of left knee 12/01/2015   COVID-19    06/2019   Dysrhythmia    afib  was cardioverted   Essential hypertension 08/24/2019   Mixed hyperlipidemia 08/24/2019   Obesity (BMI 30-39.9) 08/24/2019   Persistent atrial fibrillation ([place]) 08/24/2019    SURGICAL HISTORY: Past Surgical History:  Procedure Laterality Date   ACHILLES TENDON REPAIR Left 2012   Ruptured   BRONCHIAL BIOPSY  02/09/2022   Procedure: BRONCHIAL BIOPSIES;  Surgeon:  Garner Nash, DO;  Location: [facility];  Service: Pulmonary;;   BRONCHIAL NEEDLE ASPIRATION BIOPSY  02/09/2022   Procedure: BRONCHIAL NEEDLE ASPIRATION BIOPSIES;  Surgeon: Garner Nash, DO;  Location: [facility];  Service: Pulmonary;;   COLONOSCOPY WITH PROPOFOL N/A 12/11/2021   Procedure: COLONOSCOPY WITH PROPOFOL;  Surgeon: Carol Ada, MD;  Location: [facility];  Service: Gastroenterology;  Laterality: N/A;   HERNIA REPAIR      umbilical hernia   IR IMAGING GUIDED PORT INSERTION  02/18/2022   SKIN SURGERY Left    Basal cell carcinoma on left forearm   XI ROBOTIC ASSISTED LOWER ANTERIOR RESECTION N/A 01/09/2021   Procedure: XI ROBOTIC ASSISTED RIGHT HEMI COLECTOMY, LYSIS OF ADHESIONS, SMALL BOWEL RESECTION, INTRAOPERATIVE ASSESMENT OF PERFUSION, PARTIAL OMENTECTOMY;  Surgeon: Ileana Roup, MD;  Location: [facility];  Service: General;  Laterality: N/A;    I have reviewed the social history and family history with the patient and they are unchanged from previous note.  ALLERGIES:  is allergic to furosemide and tramadol.  MEDICATIONS:  Current Outpatient Medications  Medication Sig Dispense Refill   acetaminophen (TYLENOL) 500 MG tablet Take 500-1,000 mg by mouth every 6 (six) hours as needed (pain.).     amLODipine (NORVASC) 10 MG tablet Take 1 tablet (10 mg total) by mouth daily. 30 tablet 1   Ascorbic Acid (VITAMIN C WITH ROSE HIPS) 500 MG tablet Take 500 mg by mouth every evening.     Cholecalciferol (D3-1000) 25 MCG (1000 UT) capsule Take 1,000 Units by mouth every evening.     ELIQUIS 5 MG TABS tablet Take 1 tablet (5 mg total) by mouth 2 (two) times daily. 180 tablet 1   lidocaine-prilocaine (EMLA) cream Apply 1 Application topically as needed. 30 g 2   lidocaine-prilocaine (EMLA) cream Apply to affected area once 30 g 3   metoprolol tartrate (LOPRESSOR) 25 MG tablet Take 1 tablet (25 mg total) by mouth 2 (two) times daily. 180 tablet 3   Misc Natural Products (JOINT SUPPORT PO) Take 1 tablet by mouth daily. Instaflex Advanced Joint Support     nitroGLYCERIN (NITROSTAT) 0.4 MG SL tablet Place 1 tablet (0.4 mg total) under the tongue every 5 (five) minutes as needed. 25 tablet 7   ondansetron (ZOFRAN) 8 MG tablet Take 1 tablet every 8 hours as needed for nausea/vomiting starting on 3 days after chemotherapy 30 tablet 2   ondansetron (ZOFRAN) 8 MG tablet Take 1 tablet (8 mg total) by mouth every 8 (eight)  hours as needed for nausea or vomiting. Start on the third day after chemotherapy. 30 tablet 1   prochlorperazine (COMPAZINE) 10 MG tablet Take 1 tablet (10 mg total) by mouth every 6 (six) hours as needed. 30 tablet 2   prochlorperazine (COMPAZINE) 10 MG tablet Take 1 tablet (10 mg total) by mouth every 6 (six) hours as needed for nausea or vomiting. 30 tablet 1   spironolactone (ALDACTONE) 50 MG tablet Take 1 tablet (50 mg total) by mouth daily. 10 tablet 0   vitamin B-12 (CYANOCOBALAMIN) 1000 MCG tablet Take 1,000 mcg by mouth every evening.     Zinc 50 MG CAPS Take 50 mg by mouth every evening.     No current facility-administered medications for this visit.    PHYSICAL EXAMINATION: ECOG PERFORMANCE STATUS: {CHL ONC ECOG [phone]}  There were no vitals filed for this visit. There were no vitals filed for this visit.  GENERAL:alert, no distress and comfortable SKIN: skin color, texture,  turgor are normal, no rashes or significant lesions EYES: normal, Conjunctiva are pink and non-injected, sclera clear OROPHARYNX:no exudate, no erythema and lips, buccal mucosa, and tongue normal  NECK: supple, thyroid normal size, non-tender, without nodularity LYMPH:  no palpable lymphadenopathy in the cervical, axillary or inguinal LUNGS: clear to auscultation and percussion with normal breathing effort HEART: regular rate & rhythm and no murmurs and no lower extremity edema ABDOMEN:abdomen soft, non-tender and normal bowel sounds Musculoskeletal:no cyanosis of digits and no clubbing  NEURO: alert & oriented x 3 with fluent speech, no focal motor/sensory deficits  LABORATORY DATA:  I have reviewed the data as listed    Latest Ref Rng & Units 05/10/2022   10:15 AM 04/28/2022   10:16 AM 04/13/2022    9:24 AM  CBC  WBC 4.0 - 10.5 K/uL 7.2  5.7  5.7   Hemoglobin 13.0 - 17.0 g/dL 15.5  15.6  15.0   Hematocrit 39.0 - 52.0 % 44.7  45.1  43.1   Platelets 150 - 400 K/uL 119  143  108          Latest Ref Rng & Units 05/10/2022   10:15 AM 04/28/2022   10:16 AM 04/13/2022    9:24 AM  CMP  Glucose 70 - 99 mg/dL 102  97  92   BUN 8 - 23 mg/dL _0 Creatinine 0.61 - 1.24 mg/dL 1.03  0.89  0.93   Sodium 135 - 145 mmol/L 137  142  141   Potassium 3.5 - 5.1 mmol/L 3.8  3.6  3.5   Chloride 98 - 111 mmol/L 103  107  109   CO2 22 - 32 mmol/L _1 Calcium 8.9 - 10.3 mg/dL 8.6  8.8  8.4   Total Protein 6.5 - 8.1 g/dL 6.7  6.7  6.2   Total Bilirubin 0.3 - 1.2 mg/dL 0.9  0.6  0.6   Alkaline Phos 38 - 126 U/L 61  60  58   AST 15 - 41 U/L _2 ALT 0 - 44 U/L _3 RADIOGRAPHIC STUDIES: I have personally reviewed the radiological images as listed and agreed with the findings in the report. No results found.   ASSESSMENT & PLAN:  No problem-specific Assessment & Plan notes found for this encounter.   No orders of the defined types were placed in this encounter.  All questions were answered. The patient knows to call the clinic with any problems, questions or concerns. No barriers to learning was detected. I spent {CHL ONC TIME VISIT - PQAES:[id number]} counseling the patient face to face. The total time spent in the appointment was {CHL ONC TIME VISIT - YTRZN:[id number]} and more than 50% was on counseling and review of test results     Alla Feeling, NP 05/12/22

## 2022-05-13 ENCOUNTER — Other Ambulatory Visit: Payer: Self-pay

## 2022-05-15 ENCOUNTER — Other Ambulatory Visit: Payer: Self-pay

## 2022-05-18 DIAGNOSIS — I872 Venous insufficiency (chronic) (peripheral): Secondary | ICD-10-CM | POA: Diagnosis not present

## 2022-05-18 DIAGNOSIS — Z6834 Body mass index (BMI) 34.0-34.9, adult: Secondary | ICD-10-CM | POA: Diagnosis not present

## 2022-05-18 DIAGNOSIS — Z85118 Personal history of other malignant neoplasm of bronchus and lung: Secondary | ICD-10-CM | POA: Diagnosis not present

## 2022-05-18 DIAGNOSIS — R600 Localized edema: Secondary | ICD-10-CM | POA: Diagnosis not present

## 2022-05-18 DIAGNOSIS — J189 Pneumonia, unspecified organism: Secondary | ICD-10-CM | POA: Diagnosis not present

## 2022-05-19 ENCOUNTER — Other Ambulatory Visit: Payer: Self-pay

## 2022-05-19 MED ORDER — SPIRONOLACTONE 50 MG PO TABS: 50.0000 mg | ORAL_TABLET | Freq: Every day | ORAL | 0 refills | Status: DC

## 2022-05-25 ENCOUNTER — Ambulatory Visit (HOSPITAL_COMMUNITY)
Admission: RE | Admit: 2022-05-25 | Discharge: 2022-05-25 | Disposition: A | Discharge status: Home / Self Care | Payer: Medicare HMO | Source: Ambulatory Visit | Attending: Nurse Practitioner | Admitting: Nurse Practitioner

## 2022-05-25 DIAGNOSIS — C189 Malignant neoplasm of colon, unspecified: Secondary | ICD-10-CM | POA: Insufficient documentation

## 2022-05-25 DIAGNOSIS — N400 Enlarged prostate without lower urinary tract symptoms: Secondary | ICD-10-CM | POA: Diagnosis not present

## 2022-05-25 DIAGNOSIS — C7800 Secondary malignant neoplasm of unspecified lung: Secondary | ICD-10-CM | POA: Diagnosis not present

## 2022-05-25 DIAGNOSIS — R918 Other nonspecific abnormal finding of lung field: Secondary | ICD-10-CM | POA: Diagnosis not present

## 2022-05-25 DIAGNOSIS — K769 Liver disease, unspecified: Secondary | ICD-10-CM | POA: Diagnosis not present

## 2022-05-25 DIAGNOSIS — I2510 Atherosclerotic heart disease of native coronary artery without angina pectoris: Secondary | ICD-10-CM | POA: Diagnosis not present

## 2022-05-25 DIAGNOSIS — I700 Atherosclerosis of aorta: Secondary | ICD-10-CM | POA: Diagnosis not present

## 2022-05-25 DIAGNOSIS — K5730 Diverticulosis of large intestine without perforation or abscess without bleeding: Secondary | ICD-10-CM | POA: Diagnosis not present

## 2022-05-25 MED ORDER — HEPARIN SOD (PORK) LOCK FLUSH 100 UNIT/ML IV SOLN
INTRAVENOUS | Status: AC
  Administered 2022-05-25: 500 [IU] via INTRAVENOUS
  Filled 2022-05-25: qty 5

## 2022-05-25 MED ORDER — SODIUM CHLORIDE (PF) 0.9 % IJ SOLN
INTRAMUSCULAR | Status: AC
  Filled 2022-05-25: qty 50

## 2022-05-25 MED ORDER — IOHEXOL 9 MG/ML PO SOLN
1000.0000 mL | ORAL | Status: AC
  Administered 2022-05-25: 1000 mL via ORAL

## 2022-05-25 MED ORDER — HEPARIN SOD (PORK) LOCK FLUSH 100 UNIT/ML IV SOLN: 500.0000 [IU] | Freq: Once | INTRAVENOUS | Status: AC

## 2022-05-25 MED ORDER — IOHEXOL 300 MG/ML  SOLN
100.0000 mL | Freq: Once | INTRAMUSCULAR | Status: AC | PRN
  Administered 2022-05-25: 100 mL via INTRAVENOUS

## 2022-05-25 MED ORDER — IOHEXOL 9 MG/ML PO SOLN
ORAL | Status: AC
  Filled 2022-05-25: qty 1000

## 2022-05-25 MED FILL — Dexamethasone Sodium Phosphate Inj 100 MG/10ML: INTRAMUSCULAR | Qty: 1 | Status: AC

## 2022-05-25 NOTE — Progress Notes (Addendum)
[place]   [phone] [phone]   Clinic Follow up Note   Patient Care Team: Angelina Sheriff, MD as PCP - General (Family Medicine) Berniece Salines, DO as PCP - Cardiology (Cardiology) Truitt Merle, MD as Attending Physician (Hematology and Oncology)  Date of Service:  05/26/2022  CHIEF COMPLAINT: f/u of metastatic colon cancer   CURRENT THERAPY:  FOLFOX, q14d, starting 03/03/22 -Bevacizumab added with C2 (9/20) ASSESSMENT:  Thomas Knight is a 76 y.o. male with   1. Cancer of right colon (HCC) -stage II (pT3, N0), metastatic disease to lung and possible nodes in July 2023, MSS, KRAS/NRAS/BRAF wild type   -found on screening colonoscopy. S/p right hemicolectomy 01/09/21. -lung metastasis confirmed in RUL by bronchoscopy on 02/09/22. -FoundationOne showed MSS, low tumor burden, KRAS/NRAS/BRAF wild-type, no other targetable mutations. The benefit of EGFR inhibitor is much less in right-sided colon cancer, so I will not give it as first line but may consider in subsequent lines.  -he started FOLFOX on 03/03/22. He tolerated well overall with diarrhea for several days. Bevacizumab added with C2 (9/20).   2. Chemo toxicities: Diarrhea, hypertension  -s/p C2, he reports 5 days of diarrhea with 6-8 BM per day. Better controlled with higher dose of imodium. -he has rising BP secondary to beva. BP 188/89 today (04/28/22). He started amlodipine 04/13/22. I advised him to increase to 10 mg;   3. Recent episode of respiratory infection  -he has been on Levaquin since last week, his respiratory symptom has much improved.    PLAN: -I personally reviewed his restaging CT scan, his known pulmonary metastasis has improved, but his CT scan has showed new infiltrative change in the right lower lobe, likely related to his recent infection. --Repeat Scan in 2 mths -Hold Chemo another week, allow him to recover better. -Lab, flush, f/u and chemo FOLFOX nmd beva in 1,  and 3 weeks   SUMMARY OF ONCOLOGIC HISTORY: Oncology History  Cancer of right colon ([place])  10/2020 Procedure   Colonoscopy-Dr. Benson Norway     11/06/2020 Imaging   CT CHEST ABDOMEN PELVIS W CONTRAST   IMPRESSION: 1. 4.6 cm partially circumferential apple-core type neoplastic process involving the mid transverse colon. No findings for locoregional adenopathy or distant metastatic disease. 2. Enlarged prostate gland with median lobe hypertrophy impressing on the base of the bladder. 3. 16 mm left thyroid nodule. Recommend follow-up thyroid ultrasound examination.   01/09/2021 Initial Diagnosis   Colon cancer ([place])   01/09/2021 Pathology Results   B. COLON, RIGHT, RESECTION:  -  Adenocarcinoma, moderately differentiated, 3.0 cm  -  No carcinoma identified in seventeen lymph nodes (0/17)  -  Margins uninvolved by carcinoma  -  Tubular adenoma (x2)  -  Benign appendix  -  See oncology table and comment below   Margin Status for Invasive Carcinoma: All margins negative for  invasive carcinoma       Distance from Invasive Carcinoma to Radial (Circumferential)       Margin Status for Non-Invasive Tumor: All margins negative for  high-grade dysplasia / intramucosal carcinoma and low-grade dysplasia  Regional Lymph Nodes:       Number of Lymph Nodes with Tumor: 0       Number of Lymph Nodes Examined: 17  Tumor Deposits: 0  Distant Metastasis:       Distant Site(s) Involved: Not applicable  Pathologic Stage Classification (pTNM, AJCC 8th Edition): pT3, pN0   MMR Stable    01/09/2021  Cancer Staging   Staging form: Colon and Rectum, AJCC 8th Edition - Pathologic stage from 01/09/2021: Stage IIA (pT3, pN0, cM0) - Signed by Truitt Merle, MD on 02/12/2022 Total positive nodes: 0 Histologic grading system: 4 grade system Histologic grade (G): G2 Residual tumor (R): R0 - None   05/2021 Tumor Marker   Patient's tumor was tested for the following markers: CEA. Results of the tumor marker test  revealed 2.1.   12/11/2021 Procedure   Colonoscopy-Dr. Benson Norway  There was evidence of a prior functional end-to-end ileo-colonic anastomosis in the transverse colon. This was patent and was characterized by healthy appearing mucosa. The anastomosis was traversed. Findings: Scattered small and large-mouthed diverticula were found in the sigmoid colon. - Patent functional end-to-end ileo-colonic anastomosis, characterized by healthy appearing mucosa. - Diverticulosis in the sigmoid colon. - No specimens collected.   01/25/2022 Imaging   CT CHEST ABDOMEN PELVIS W CONTRAST   IMPRESSION: 1. New right-sided pulmonary nodules measure up to 11 mm, nonspecific but concerning for metastatic disease. Consider further evaluation with nuclear medicine PET/CT. 2. New nodularity/lymph nodes in the central mesentery, concerning for nodal disease involvement. 3. Slightly increased size of prominent retroperitoneal lymph nodes, nonspecific but also somewhat concerning for nodal disease involvement. 4. Surgical change of partial colectomy without evidence of local recurrence. 5. Stable prominent mediastinal lymph nodes, nonspecific but stability is reassuring. Attention on follow-up imaging suggested. 6. Stable prominent right external iliac lymph node, nonspecific but stability is reassuring. Attention on follow-up imaging suggested. 7. Stable hypodense 8 mm hepatic lesion technically too small to accurately characterize but stability is reassuring. Attention on follow-up imaging suggested. 8. 1.9 cm incidental left thyroid nodule. Recommend thyroid US. Reference: J Am Coll Radiol. 2015 Feb;12(2): 143-50 9.  Aortic Atherosclerosis (ICD10-I70.0).   02/09/2022 Procedure   Bronchoscopy under the care of Dr. Valeta Harms    02/09/2022 Pathology Results   FINAL MICROSCOPIC DIAGNOSIS:   A. LUNG, RUL, FINE NEEDLE ASPIRATION  BIOPSY FORCEPS:  Adenocarcinoma with morphologic features consistent with metastasis  from a colorectal primary.  Please see comment.   Comment: The following immunostains are performed with appropriate controls :  TTF-1: Negative .  Napsin A: Negative .  CK7: Negative.  CK20: Positive.  CDX2: Positive .   The above immuno histochemical profile is supportive of the rendered diagnosis.    Colon cancer metastasized to lung [facility])  02/09/2022 Pathology Results   FINAL MICROSCOPIC DIAGNOSIS:   A. LUNG, RUL, FINE NEEDLE ASPIRATION  BIOPSY FORCEPS:  Adenocarcinoma with morphologic features consistent with metastasis from a colorectal primary.  Please see comment.   Comment: The following immunostains are performed with appropriate controls :  TTF-1: Negative .  Napsin A: Negative .  CK7: Negative.  CK20: Positive.  CDX2: Positive .   The above immuno histochemical profile is supportive of the rendered diagnosis.    02/12/2022 Initial Diagnosis   Colon cancer metastasized to lung [facility])   03/03/2022 -  Chemotherapy   Patient is on Treatment Plan : COLORECTAL FOLFOX + Bevacizumab q14d        INTERVAL HISTORY:  Thomas Knight is here for a follow up of  metastatic colon cancer He was last seen by me on 04/28/2022 He presents to the clinic accompanied by wife.Pt states he develop a cough and congestion.and swellen in the ankles.Pt reports he had an allergic reaction to Lasix, was able to received a steroid injection.Pt started an antibiotic on Thursday and is still continuing to take  it.  Pt reports he has lost 22 lbs and doesn't have an appetite.   All other systems were reviewed with the patient and are negative.  MEDICAL HISTORY:  Past Medical History:  Diagnosis Date   Bilateral leg edema 08/24/2019   Cancer ([place])    skin ca, colon   Chronic cough    Chronic pain of left knee 12/01/2015   COVID-19    06/2019   Dysrhythmia    afib  was cardioverted   Essential hypertension 08/24/2019   Mixed hyperlipidemia 08/24/2019   Obesity (BMI 30-39.9) 08/24/2019    Persistent atrial fibrillation ([place]) 08/24/2019    SURGICAL HISTORY: Past Surgical History:  Procedure Laterality Date   ACHILLES TENDON REPAIR Left 2012   Ruptured   BRONCHIAL BIOPSY  02/09/2022   Procedure: BRONCHIAL BIOPSIES;  Surgeon: Garner Nash, DO;  Location: [facility];  Service: Pulmonary;;   BRONCHIAL NEEDLE ASPIRATION BIOPSY  02/09/2022   Procedure: BRONCHIAL NEEDLE ASPIRATION BIOPSIES;  Surgeon: Garner Nash, DO;  Location: [facility];  Service: Pulmonary;;   COLONOSCOPY WITH PROPOFOL N/A 12/11/2021   Procedure: COLONOSCOPY WITH PROPOFOL;  Surgeon: Carol Ada, MD;  Location: [facility];  Service: Gastroenterology;  Laterality: N/A;   HERNIA REPAIR     umbilical hernia   IR IMAGING GUIDED PORT INSERTION  02/18/2022   SKIN SURGERY Left    Basal cell carcinoma on left forearm   XI ROBOTIC ASSISTED LOWER ANTERIOR RESECTION N/A 01/09/2021   Procedure: XI ROBOTIC ASSISTED RIGHT HEMI COLECTOMY, LYSIS OF ADHESIONS, SMALL BOWEL RESECTION, INTRAOPERATIVE ASSESMENT OF PERFUSION, PARTIAL OMENTECTOMY;  Surgeon: Ileana Roup, MD;  Location: [facility];  Service: General;  Laterality: N/A;    I have reviewed the social history and family history with the patient and they are unchanged from previous note.  ALLERGIES:  is allergic to furosemide and tramadol.  MEDICATIONS:  Current Outpatient Medications  Medication Sig Dispense Refill   levofloxacin (LEVAQUIN) 750 MG tablet Take 750 mg by mouth daily.     acetaminophen (TYLENOL) 500 MG tablet Take 500-1,000 mg by mouth every 6 (six) hours as needed (pain.).     amLODipine (NORVASC) 10 MG tablet Take 1 tablet (10 mg total) by mouth daily. 30 tablet 1   Ascorbic Acid (VITAMIN C WITH ROSE HIPS) 500 MG tablet Take 500 mg by mouth every evening.     Cholecalciferol (D3-1000) 25 MCG (1000 UT) capsule Take 1,000 Units by mouth every evening.     ELIQUIS 5 MG TABS tablet Take 1 tablet (5 mg total) by mouth 2 (two) times daily.  180 tablet 1   lidocaine-prilocaine (EMLA) cream Apply 1 Application topically as needed. 30 g 2   lidocaine-prilocaine (EMLA) cream Apply to affected area once 30 g 3   metoprolol tartrate (LOPRESSOR) 25 MG tablet Take 1 tablet (25 mg total) by mouth 2 (two) times daily. 180 tablet 3   Misc Natural Products (JOINT SUPPORT PO) Take 1 tablet by mouth daily. Instaflex Advanced Joint Support     nitroGLYCERIN (NITROSTAT) 0.4 MG SL tablet Place 1 tablet (0.4 mg total) under the tongue every 5 (five) minutes as needed. 25 tablet 7   ondansetron (ZOFRAN) 8 MG tablet Take 1 tablet every 8 hours as needed for nausea/vomiting starting on 3 days after chemotherapy 30 tablet 2   ondansetron (ZOFRAN) 8 MG tablet Take 1 tablet (8 mg total) by mouth every 8 (eight) hours as needed for nausea or vomiting. Start on the third day after chemotherapy.  30 tablet 1   prochlorperazine (COMPAZINE) 10 MG tablet Take 1 tablet (10 mg total) by mouth every 6 (six) hours as needed. 30 tablet 2   prochlorperazine (COMPAZINE) 10 MG tablet Take 1 tablet (10 mg total) by mouth every 6 (six) hours as needed for nausea or vomiting. 30 tablet 1   spironolactone (ALDACTONE) 50 MG tablet Take 1 tablet (50 mg total) by mouth daily. 10 tablet 0   vitamin B-12 (CYANOCOBALAMIN) 1000 MCG tablet Take 1,000 mcg by mouth every evening.     Zinc 50 MG CAPS Take 50 mg by mouth every evening.     No current facility-administered medications for this visit.    PHYSICAL EXAMINATION: ECOG PERFORMANCE STATUS: 1 - Symptomatic but completely ambulatory  Vitals:   05/26/22 0934  BP: (!) 140/88  Pulse: 63  Resp: 20  Temp: 97.8 F (36.6 C)  SpO2: 98%   Wt Readings from Last 3 Encounters:  05/26/22 232 lb 11.2 oz (105.6 kg)  05/10/22 241 lb 1.6 oz (109.4 kg)  04/28/22 247 lb 5 oz (112.2 kg)     GENERAL:alert, no distress and comfortable SKIN: skin color normal, no rashes or significant lesions EYES: normal, Conjunctiva are pink and  non-injected, sclera clear  NEURO: alert & oriented x 3 with fluent speech {LABORATORY DATA:  I have reviewed the data as listed    Latest Ref Rng & Units 05/26/2022    9:04 AM 05/10/2022   10:15 AM 04/28/2022   10:16 AM  CBC  WBC 4.0 - 10.5 K/uL 7.1  7.2  5.7   Hemoglobin 13.0 - 17.0 g/dL 15.2  15.5  15.6   Hematocrit 39.0 - 52.0 % 45.3  44.7  45.1   Platelets 150 - 400 K/uL 171  119  143         Latest Ref Rng & Units 05/26/2022    9:04 AM 05/10/2022   10:15 AM 04/28/2022   10:16 AM  CMP  Glucose 70 - 99 mg/dL 110  102  97   BUN 8 - 23 mg/dL _0 Creatinine 0.61 - 1.24 mg/dL 1.01  1.03  0.89   Sodium 135 - 145 mmol/L 135  137  142   Potassium 3.5 - 5.1 mmol/L 3.8  3.8  3.6   Chloride 98 - 111 mmol/L 102  103  107   CO2 22 - 32 mmol/L _1 Calcium 8.9 - 10.3 mg/dL 9.4  8.6  8.8   Total Protein 6.5 - 8.1 g/dL 7.4  6.7  6.7   Total Bilirubin 0.3 - 1.2 mg/dL 0.7  0.9  0.6   Alkaline Phos 38 - 126 U/L 73  61  60   AST 15 - 41 U/L _2 ALT 0 - 44 U/L _3 RADIOGRAPHIC STUDIES: I have personally reviewed the radiological images as listed and agreed with the findings in the report. CT CHEST ABDOMEN PELVIS W CONTRAST  Result Date: 05/26/2022 CLINICAL DATA:  76 year old male with history of metastatic colon cancer. Follow-up study. * Tracking Code: BO * EXAM: CT CHEST, ABDOMEN, AND PELVIS WITH CONTRAST TECHNIQUE: Multidetector CT imaging of the chest, abdomen and pelvis was performed following the standard protocol during bolus administration of intravenous contrast. RADIATION DOSE REDUCTION: This exam was performed according to the departmental dose-optimization program which includes automated exposure control, adjustment of the  mA and/or kV according to patient size and/or use of iterative reconstruction technique. CONTRAST:  126m OMNIPAQUE IOHEXOL 300 MG/ML  SOLN COMPARISON:  PET-CT 02/04/2022. CT of the chest, abdomen and pelvis 01/25/2022.  FINDINGS: CT CHEST FINDINGS Cardiovascular: Heart size is normal. There is no significant pericardial fluid, thickening or pericardial calcification. There is aortic atherosclerosis, as well as atherosclerosis of the great vessels of the mediastinum and the coronary arteries, including calcified atherosclerotic plaque in the left anterior descending coronary arteries. Right internal jugular single-lumen Port-A-Cath with tip terminating in the distal superior vena cava. Mediastinum/Nodes: No pathologically enlarged mediastinal or hilar lymph nodes. Esophagus is unremarkable in appearance. No axillary lymphadenopathy. Intermediate attenuation nodule in the left lobe of the thyroid gland (axial image 9 of series 2) measuring 1.9 x 1.3 cm, similar to numerous prior examinations, previously evaluated by thyroid ultrasound on 02/09/2021 (considered benign at that time not requiring biopsy). Lungs/Pleura: Previously noted pulmonary nodules have decreased in size compared to the prior examination, most evident in the posterior aspect of the right upper lobe (axial image 53 of series 6) where there is currently a 7 x 4 mm pulmonary nodule (previously 1.0 x 0.8 cm) on prior CT 01/25/2022. However, there are also new nodular and mass-like areas of architectural distortion in the lungs bilaterally, most evident in the lower lobes. The largest solid-appearing nodule is in the right lower lobe (axial image 109 of series 6) measuring 1.9 x 1.2 cm. The largest mass-like area of architectural distortion is sub solid in appearance and estimated to measure approximately 5.5 x 3.7 cm in the right lower lobe (axial image 101 of series 6). Widespread areas of septal thickening, thickening of the peribronchovascular interstitium and peribronchovascular ground-glass attenuation are noted elsewhere in the lung bases. No pleural effusions. Musculoskeletal: There are no aggressive appearing lytic or blastic lesions noted in the visualized  portions of the skeleton. CT ABDOMEN PELVIS FINDINGS Hepatobiliary: Subcentimeter low-attenuation lesion in segment 4A of the liver, incompletely characterized, but similar to prior studies and statistically likely a tiny cyst (no imaging follow-up recommended). No other suspicious cystic or solid hepatic lesions. No intra or extrahepatic biliary ductal dilatation. Gallbladder is unremarkable in appearance. Pancreas: No pancreatic mass. No pancreatic ductal dilatation. No pancreatic or peripancreatic fluid collections or inflammatory changes. Spleen: Unremarkable. Adrenals/Urinary Tract: Right kidney and bilateral adrenal glands are normal in appearance. Exophytic lesion extending from the anterolateral aspect of the lower pole of the left kidney measuring 2.3 cm in diameter, compatible with a simple (Bosniak class 1) cyst, no imaging follow-up recommended. No hydroureteronephrosis. Urinary bladder is unremarkable in appearance. Stomach/Bowel: The appearance of the stomach is normal. No pathologic dilatation of small bowel or colon. Numerous colonic diverticuli are noted, without surrounding inflammatory changes to suggest an acute diverticulitis at this time. Status post right hemicolectomy. Vascular/Lymphatic: Atherosclerosis in the abdominal aorta and pelvic vasculature, without evidence of aneurysm or dissection. No lymphadenopathy noted in the abdomen or pelvis. Reproductive: Prostate gland is diffusely enlarged and heterogeneous in appearance measuring up to 7.1 x 6.4 cm. Other: No significant volume of ascites.  No pneumoperitoneum. Musculoskeletal: There are no aggressive appearing lytic or blastic lesions noted in the visualized portions of the skeleton. IMPRESSION: 1. While there has been regression of several pulmonary nodules when compared to the prior examination, there are substantial changes in the lungs, most evident in the mid to lower lungs where there are numerous new nodular and mass-like areas  of architectural distortion, as detailed above.  The appearance would be unusual for metastatic disease, although this is not excluded. Differential considerations include new pulmonary metastases, drug reaction and/or atypical infection. Close attention on short-term interval follow-up chest CT is recommended in the next 1-2 months to re-evaluate these findings. 2. No definitive findings to suggest metastatic disease in the abdomen or pelvis. 3. Colonic diverticulosis without evidence of acute diverticulitis at this time. 4. Severe prostatomegaly. 5. Aortic atherosclerosis, in addition to left anterior descending coronary artery disease. Assessment for potential risk factor modification, dietary therapy or pharmacologic therapy may be warranted, if clinically indicated. 6. Additional incidental findings, as above. Electronically Signed   By: Vinnie Langton M.D.   On: 05/26/2022 08:57      No orders of the defined types were placed in this encounter.  All questions were answered. The patient knows to call the clinic with any problems, questions or concerns. No barriers to learning was detected. The total time spent in the appointment was 30 minutes.     Truitt Merle, MD 05/26/2022   Felicity Coyer, CMA, am acting as scribe for Truitt Merle, MD.   I have reviewed the above documentation for accuracy and completeness, and I agree with the above.

## 2022-05-25 NOTE — Assessment & Plan Note (Signed)
-  stage II (pT3, N0), metastatic disease to lung and possible nodes in July 2023, MSS, KRAS/NRAS/BRAF wild type   -found on screening colonoscopy. S/p right hemicolectomy 01/09/21. -lung metastasis confirmed in RUL by bronchoscopy on 02/09/22. -FoundationOne showed MSS, low tumor burden, KRAS/NRAS/BRAF wild-type, no other targetable mutations. The benefit of EGFR inhibitor is much less in right-sided colon cancer, so I will not give it as first line but may consider in subsequent lines.  -he started FOLFOX on 03/03/22. He tolerated well overall with diarrhea for several days. Bevacizumab added with C2 (9/20).

## 2022-05-26 ENCOUNTER — Inpatient Hospital Stay (HOSPITAL_BASED_OUTPATIENT_CLINIC_OR_DEPARTMENT_OTHER): Payer: Medicare HMO | Admitting: Hematology

## 2022-05-26 ENCOUNTER — Encounter: Payer: Self-pay | Admitting: Hematology

## 2022-05-26 ENCOUNTER — Other Ambulatory Visit: Payer: Self-pay

## 2022-05-26 ENCOUNTER — Inpatient Hospital Stay: Payer: Medicare HMO

## 2022-05-26 VITALS — BP 140/88 | HR 63 | Temp 97.8°F | Resp 20 | Ht 71.0 in | Wt 232.7 lb

## 2022-05-26 DIAGNOSIS — C189 Malignant neoplasm of colon, unspecified: Secondary | ICD-10-CM

## 2022-05-26 DIAGNOSIS — C7800 Secondary malignant neoplasm of unspecified lung: Secondary | ICD-10-CM | POA: Diagnosis not present

## 2022-05-26 DIAGNOSIS — R197 Diarrhea, unspecified: Secondary | ICD-10-CM | POA: Diagnosis not present

## 2022-05-26 DIAGNOSIS — Z5112 Encounter for antineoplastic immunotherapy: Secondary | ICD-10-CM | POA: Diagnosis not present

## 2022-05-26 DIAGNOSIS — C184 Malignant neoplasm of transverse colon: Secondary | ICD-10-CM | POA: Diagnosis not present

## 2022-05-26 DIAGNOSIS — J189 Pneumonia, unspecified organism: Secondary | ICD-10-CM | POA: Diagnosis not present

## 2022-05-26 DIAGNOSIS — R059 Cough, unspecified: Secondary | ICD-10-CM | POA: Diagnosis not present

## 2022-05-26 DIAGNOSIS — C182 Malignant neoplasm of ascending colon: Secondary | ICD-10-CM | POA: Diagnosis not present

## 2022-05-26 DIAGNOSIS — E860 Dehydration: Secondary | ICD-10-CM | POA: Diagnosis not present

## 2022-05-26 DIAGNOSIS — I509 Heart failure, unspecified: Secondary | ICD-10-CM | POA: Diagnosis not present

## 2022-05-26 DIAGNOSIS — Z95828 Presence of other vascular implants and grafts: Secondary | ICD-10-CM

## 2022-05-26 DIAGNOSIS — I110 Hypertensive heart disease with heart failure: Secondary | ICD-10-CM | POA: Diagnosis not present

## 2022-05-26 DIAGNOSIS — R600 Localized edema: Secondary | ICD-10-CM | POA: Diagnosis not present

## 2022-05-26 DIAGNOSIS — Z5111 Encounter for antineoplastic chemotherapy: Secondary | ICD-10-CM | POA: Diagnosis not present

## 2022-05-26 DIAGNOSIS — Z452 Encounter for adjustment and management of vascular access device: Secondary | ICD-10-CM | POA: Diagnosis not present

## 2022-05-26 LAB — CBC WITH DIFFERENTIAL (CANCER CENTER ONLY)
Abs Immature Granulocytes: 0.02 10*3/uL (ref 0.00–0.07)
Basophils Absolute: 0.1 10*3/uL (ref 0.0–0.1)
Basophils Relative: 1 %
Eosinophils Absolute: 0.1 10*3/uL (ref 0.0–0.5)
Eosinophils Relative: 2 %
HCT: 45.3 % (ref 39.0–52.0)
Hemoglobin: 15.2 g/dL (ref 13.0–17.0)
Immature Granulocytes: 0 %
Lymphocytes Relative: 22 %
Lymphs Abs: 1.6 10*3/uL (ref 0.7–4.0)
MCH: 32 pg (ref 26.0–34.0)
MCHC: 33.6 g/dL (ref 30.0–36.0)
MCV: 95.4 fL (ref 80.0–100.0)
Monocytes Absolute: 0.8 10*3/uL (ref 0.1–1.0)
Monocytes Relative: 12 %
Neutro Abs: 4.5 10*3/uL (ref 1.7–7.7)
Neutrophils Relative %: 63 %
Platelet Count: 171 10*3/uL (ref 150–400)
RBC: 4.75 MIL/uL (ref 4.22–5.81)
RDW: 17 % — ABNORMAL HIGH (ref 11.5–15.5)
WBC Count: 7.1 10*3/uL (ref 4.0–10.5)
nRBC: 0 % (ref 0.0–0.2)

## 2022-05-26 LAB — CMP (CANCER CENTER ONLY)
ALT: 18 U/L (ref 0–44)
AST: 19 U/L (ref 15–41)
Albumin: 3.7 g/dL (ref 3.5–5.0)
Alkaline Phosphatase: 73 U/L (ref 38–126)
Anion gap: 6 (ref 5–15)
BUN: 12 mg/dL (ref 8–23)
CO2: 27 mmol/L (ref 22–32)
Calcium: 9.4 mg/dL (ref 8.9–10.3)
Chloride: 102 mmol/L (ref 98–111)
Creatinine: 1.01 mg/dL (ref 0.61–1.24)
GFR, Estimated: >60 mL/min (ref >60)
Glucose, Bld: 110 mg/dL — ABNORMAL HIGH (ref 70–99)
Potassium: 3.8 mmol/L (ref 3.5–5.1)
Sodium: 135 mmol/L (ref 135–145)
Total Bilirubin: 0.7 mg/dL (ref 0.3–1.2)
Total Protein: 7.4 g/dL (ref 6.5–8.1)

## 2022-05-26 LAB — TOTAL PROTEIN, URINE DIPSTICK: Protein, ur: <30 mg/dL — AB

## 2022-05-26 MED ORDER — HEPARIN SOD (PORK) LOCK FLUSH 100 UNIT/ML IV SOLN
500.0000 [IU] | Freq: Once | INTRAVENOUS | Status: AC | PRN
  Administered 2022-05-26: 500 [IU]

## 2022-05-26 MED ORDER — SODIUM CHLORIDE 0.9% FLUSH
10.0000 mL | Freq: Once | INTRAVENOUS | Status: AC | PRN
  Administered 2022-05-26: 10 mL

## 2022-05-26 MED ORDER — SODIUM CHLORIDE 0.9% FLUSH
10.0000 mL | INTRAVENOUS | Status: AC | PRN
  Administered 2022-05-26: 10 mL

## 2022-05-27 ENCOUNTER — Other Ambulatory Visit: Payer: Self-pay

## 2022-05-28 ENCOUNTER — Encounter: Payer: Self-pay | Admitting: Cardiology

## 2022-05-28 ENCOUNTER — Ambulatory Visit: Payer: Medicare HMO | Attending: Cardiology | Admitting: Cardiology

## 2022-05-28 VITALS — BP 132/88 | HR 77 | Ht 71.0 in | Wt 234.6 lb

## 2022-05-28 DIAGNOSIS — E782 Mixed hyperlipidemia: Secondary | ICD-10-CM

## 2022-05-28 DIAGNOSIS — I10 Essential (primary) hypertension: Secondary | ICD-10-CM | POA: Diagnosis not present

## 2022-05-28 DIAGNOSIS — I480 Paroxysmal atrial fibrillation: Secondary | ICD-10-CM

## 2022-05-28 DIAGNOSIS — G4733 Obstructive sleep apnea (adult) (pediatric): Secondary | ICD-10-CM

## 2022-05-28 NOTE — Patient Instructions (Signed)
Medication Instructions:  Your physician recommends that you continue on your current medications as directed. Please refer to the Current Medication list given to you today.  *If you need a refill on your cardiac medications before your next appointment, please call your pharmacy*   Lab Work: NONE If you have labs (blood work) drawn today and your tests are completely normal, you will receive your results only by: [place] (if you have MyChart) OR A paper copy in the mail If you have any lab test that is abnormal or we need to change your treatment, we will call you to review the results.   Testing/Procedures: Your physician has requested that you have an echocardiogram. Echocardiography is a painless test that uses sound waves to create images of your heart. It provides your doctor with information about the size and shape of your heart and how well your heart's chambers and valves are working. This procedure takes approximately one hour. There are no restrictions for this procedure. Please do NOT wear cologne, perfume, aftershave, or lotions (deodorant is allowed). Please arrive 15 minutes prior to your appointment time.    Follow-Up: At [facility], you and your health needs are our priority.  As part of our continuing mission to provide you with exceptional heart care, we have created designated Provider Care Teams.  These Care Teams include your primary Cardiologist (physician) and Advanced Practice Providers (APPs -  Physician Assistants and Nurse Practitioners) who all work together to provide you with the care you need, when you need it.  We recommend signing up for the patient portal called "MyChart".  Sign up information is provided on this After Visit Summary.  MyChart is used to connect with patients for Virtual Visits (Telemedicine).  Patients are able to view lab/test results, encounter notes, upcoming appointments, etc.  Non-urgent messages can be sent to your  provider as well.   To learn more about what you can do with MyChart, go to [email or web address].    Your next appointment:   6 month(s)  The format for your next appointment:   In Person  Provider:   Berniece Salines, DO

## 2022-05-28 NOTE — Progress Notes (Signed)
Cardiology Office Note:    Date:  05/28/2022   ID:  [name], DOB 12/26/1945, MRN [id number]  PCP:  Angelina Sheriff, MD  Cardiologist:  Berniece Salines, DO  Electrophysiologist:  None   Referring MD: Angelina Sheriff, MD   " I am doing well"  History of Present Illness:    Thomas Knight is a 76 y.o. male with a hx of minimal coronary artery disease seen on coronary CTA, paroxysmal atrial fibrillation on Eliquis twice daily and metoprolol 25 mg twice a day, he is status post cardioversion and remains in sinus rhythm on his metoprolol.  He had declined use of antiarrhythmic.  History of COVID-19 infection back in March 2021.  Other includes hypertension, hyperlipidemia OSA .   When I last saw the patient October 2021 at that time he appeared to be doing well from a cardiovascular standpoint.  We discussed his report from his CTA which show minimal CAD as well as we talked about managing his paroxysmal atrial fibrillation on his medication regimen which included beta-blocker as well as Eliquis 5 mg twice a day.  I saw the patient on October 21, 2020 at that time he had a planned colonoscopy at [facility].  He had just been diagnosed with sleep apnea and was pending his sleep study.  Since I saw the patient underwent first colonoscopy with Dr. Benson Norway 10/2020. He was found to have a mass in his transverse colon that was biopsied. This showed mod diff adenoCA. 35mm desc colon polyp showed adenomatous polyp with HGD. CT CAP demonstrated an apple core mass in his transverse colon appears to be in the proximal half of it measuring 4.6 cm in size. No local regional adenopathy apparent on the CAT scan. No findings of metastatic disease.  Saw the patient in November 2022 at that time he was doing well from a cardiovascular standpoint. Since I saw the patient he has had multiple chemotherapy treatments.  He follows with heme-onc.   Past Medical History:  Diagnosis Date   Bilateral leg  edema 08/24/2019   Cancer ([place])    skin ca, colon   Chronic cough    Chronic pain of left knee 12/01/2015   COVID-19    06/2019   Dysrhythmia    afib  was cardioverted   Essential hypertension 08/24/2019   Mixed hyperlipidemia 08/24/2019   Obesity (BMI 30-39.9) 08/24/2019   Persistent atrial fibrillation ([place]) 08/24/2019    Past Surgical History:  Procedure Laterality Date   ACHILLES TENDON REPAIR Left 2012   Ruptured   BRONCHIAL BIOPSY  02/09/2022   Procedure: BRONCHIAL BIOPSIES;  Surgeon: Garner Nash, DO;  Location: [name];  Service: Pulmonary;;   BRONCHIAL NEEDLE ASPIRATION BIOPSY  02/09/2022   Procedure: BRONCHIAL NEEDLE ASPIRATION BIOPSIES;  Surgeon: Garner Nash, DO;  Location: [facility];  Service: Pulmonary;;   COLONOSCOPY WITH PROPOFOL N/A 12/11/2021   Procedure: COLONOSCOPY WITH PROPOFOL;  Surgeon: Carol Ada, MD;  Location: [facility];  Service: Gastroenterology;  Laterality: N/A;   HERNIA REPAIR     umbilical hernia   IR IMAGING GUIDED PORT INSERTION  02/18/2022   SKIN SURGERY Left    Basal cell carcinoma on left forearm   XI ROBOTIC ASSISTED LOWER ANTERIOR RESECTION N/A 01/09/2021   Procedure: XI ROBOTIC ASSISTED RIGHT HEMI COLECTOMY, LYSIS OF ADHESIONS, SMALL BOWEL RESECTION, INTRAOPERATIVE ASSESMENT OF PERFUSION, PARTIAL OMENTECTOMY;  Surgeon: Ileana Roup, MD;  Location: [facility];  Service: General;  Laterality: N/A;  Current Medications: Current Meds  Medication Sig   acetaminophen (TYLENOL) 500 MG tablet Take 500-1,000 mg by mouth every 6 (six) hours as needed (pain.).   amLODipine (NORVASC) 10 MG tablet Take 1 tablet (10 mg total) by mouth daily.   Ascorbic Acid (VITAMIN C WITH ROSE HIPS) 500 MG tablet Take 500 mg by mouth every evening.   Bacillus Coagulans-Inulin (ALIGN PREBIOTIC-PROBIOTIC PO) Take by mouth.   Cholecalciferol (D3-1000) 25 MCG (1000 UT) capsule Take 1,000 Units by mouth every evening.   ELIQUIS 5 MG TABS tablet  Take 1 tablet (5 mg total) by mouth 2 (two) times daily.   levofloxacin (LEVAQUIN) 750 MG tablet Take 750 mg by mouth daily.   lidocaine-prilocaine (EMLA) cream Apply 1 Application topically as needed.   lidocaine-prilocaine (EMLA) cream Apply to affected area once   metoprolol tartrate (LOPRESSOR) 25 MG tablet Take 1 tablet (25 mg total) by mouth 2 (two) times daily.   Misc Natural Products (JOINT SUPPORT PO) Take 1 tablet by mouth daily. Instaflex Advanced Joint Support   nitroGLYCERIN (NITROSTAT) 0.4 MG SL tablet Place 1 tablet (0.4 mg total) under the tongue every 5 (five) minutes as needed.   ondansetron (ZOFRAN) 8 MG tablet Take 1 tablet every 8 hours as needed for nausea/vomiting starting on 3 days after chemotherapy   ondansetron (ZOFRAN) 8 MG tablet Take 1 tablet (8 mg total) by mouth every 8 (eight) hours as needed for nausea or vomiting. Start on the third day after chemotherapy.   prochlorperazine (COMPAZINE) 10 MG tablet Take 1 tablet (10 mg total) by mouth every 6 (six) hours as needed.   prochlorperazine (COMPAZINE) 10 MG tablet Take 1 tablet (10 mg total) by mouth every 6 (six) hours as needed for nausea or vomiting.   spironolactone (ALDACTONE) 50 MG tablet Take 1 tablet (50 mg total) by mouth daily.   vitamin B-12 (CYANOCOBALAMIN) 1000 MCG tablet Take 1,000 mcg by mouth every evening.   Zinc 50 MG CAPS Take 50 mg by mouth every evening.     Allergies:   Furosemide and Tramadol   Social History   Socioeconomic History   Marital status: Married    Spouse name: Not on file   Number of children: Not on file   Years of education: Not on file   Highest education level: Not on file  Occupational History   Not on file  Tobacco Use   Smoking status: Never   Smokeless tobacco: Never  Vaping Use   Vaping Use: Never used  Substance and Sexual Activity   Alcohol use: Never   Drug use: Never   Sexual activity: Not Currently  Other Topics Concern   Not on file  Social  History Narrative   Not on file   Social Determinants of Health   Financial Resource Strain: Not on file  Food Insecurity: Not on file  Transportation Needs: Not on file  Physical Activity: Not on file  Stress: Not on file  Social Connections: Not on file     Family History: The patient's family history includes Lung cancer in his father.  ROS:   Review of Systems  Constitution: Negative for decreased appetite, fever and weight gain.  HENT: Negative for congestion, ear discharge, hoarse voice and sore throat.   Eyes: Negative for discharge, redness, vision loss in right eye and visual halos.  Cardiovascular: Negative for chest pain, dyspnea on exertion, leg swelling, orthopnea and palpitations.  Respiratory: Negative for cough, hemoptysis, shortness of breath and snoring.  Endocrine: Negative for heat intolerance and polyphagia.  Hematologic/Lymphatic: Negative for bleeding problem. Does not bruise/bleed easily.  Skin: Negative for flushing, nail changes, rash and suspicious lesions.  Musculoskeletal: Negative for arthritis, joint pain, muscle cramps, myalgias, neck pain and stiffness.  Gastrointestinal: Negative for abdominal pain, bowel incontinence, diarrhea and excessive appetite.  Genitourinary: Negative for decreased libido, genital sores and incomplete emptying.  Neurological: Negative for brief paralysis, focal weakness, headaches and loss of balance.  Psychiatric/Behavioral: Negative for altered mental status, depression and suicidal ideas.  Allergic/Immunologic: Negative for HIV exposure and persistent infections.    EKGs/Labs/Other Studies Reviewed:    The following studies were reviewed today:   EKG: None today   Recent Labs: 05/10/2022: B Natriuretic Peptide 76.7 05/26/2022: ALT 18; BUN 12; Creatinine 1.01; Hemoglobin 15.2; Platelet Count 171; Potassium 3.8; Sodium 135  Recent Lipid Panel No results found for: "CHOL", "TRIG", "HDL", "CHOLHDL", "VLDL",  "LDLCALC", "LDLDIRECT"  Physical Exam:    VS:  BP 132/88   Pulse 77   Ht 5\' 11"  (1.803 m)   Wt 234 lb 9.6 oz (106.4 kg)   SpO2 96%   BMI 32.72 kg/m     Wt Readings from Last 3 Encounters:  05/28/22 234 lb 9.6 oz (106.4 kg)  05/26/22 232 lb 11.2 oz (105.6 kg)  05/10/22 241 lb 1.6 oz (109.4 kg)     GEN: Well nourished, well developed in no acute distress HEENT: Normal NECK: No JVD; No carotid bruits LYMPHATICS: No lymphadenopathy CARDIAC: S1S2 noted,RRR, no murmurs, rubs, gallops RESPIRATORY:  Clear to auscultation without rales, wheezing or rhonchi  ABDOMEN: Soft, non-tender, non-distended, +bowel sounds, no guarding. EXTREMITIES: No edema, No cyanosis, no clubbing MUSCULOSKELETAL:  No deformity  SKIN: Warm and dry NEUROLOGIC:  Alert and oriented x 3, non-focal PSYCHIATRIC:  Normal affect, good insight  ASSESSMENT:    1. PAF (paroxysmal atrial fibrillation) ([place])   2. Essential hypertension   3. Obstructive sleep apnea syndrome   4. Mixed hyperlipidemia     PLAN:     He appears to be doing well from a cardiovascular standpoint.  We will continue his current medication regimen. Blood pressure is acceptable, continue with current antihypertensive regimen. hyperlipidemia - continue with current statin medication. In light of his recent chemotherapy I will like to get an echocardiogram to assess his LV function. No anginal symptoms.   The patient is in agreement with the above plan. The patient left the office in stable condition.  The patient will follow up in 6 months or sooner if needed.   Medication Adjustments/Labs and Tests Ordered: Current medicines are reviewed at length with the patient today.  Concerns regarding medicines are outlined above.  Orders Placed This Encounter  Procedures   ECHOCARDIOGRAM COMPLETE    No orders of the defined types were placed in this encounter.    Patient Instructions  Medication Instructions:  Your physician recommends  that you continue on your current medications as directed. Please refer to the Current Medication list given to you today.  *If you need a refill on your cardiac medications before your next appointment, please call your pharmacy*   Lab Work: NONE If you have labs (blood work) drawn today and your tests are completely normal, you will receive your results only by: [place] (if you have MyChart) OR A paper copy in the mail If you have any lab test that is abnormal or we need to change your treatment, we will call you to review the results.  Testing/Procedures: Your physician has requested that you have an echocardiogram. Echocardiography is a painless test that uses sound waves to create images of your heart. It provides your doctor with information about the size and shape of your heart and how well your heart's chambers and valves are working. This procedure takes approximately one hour. There are no restrictions for this procedure. Please do NOT wear cologne, perfume, aftershave, or lotions (deodorant is allowed). Please arrive 15 minutes prior to your appointment time.    Follow-Up: At [facility], you and your health needs are our priority.  As part of our continuing mission to provide you with exceptional heart care, we have created designated Provider Care Teams.  These Care Teams include your primary Cardiologist (physician) and Advanced Practice Providers (APPs -  Physician Assistants and Nurse Practitioners) who all work together to provide you with the care you need, when you need it.  We recommend signing up for the patient portal called "MyChart".  Sign up information is provided on this After Visit Summary.  MyChart is used to connect with patients for Virtual Visits (Telemedicine).  Patients are able to view lab/test results, encounter notes, upcoming appointments, etc.  Non-urgent messages can be sent to your provider as well.   To learn more about what you can  do with MyChart, go to [email or web address].    Your next appointment:   6 month(s)  The format for your next appointment:   In Person  Provider:   Berniece Salines, DO     Adopting a Healthy Lifestyle.  Know what a healthy weight is for you (roughly BMI <25) and aim to maintain this   Aim for 7+ servings of fruits and vegetables daily   65-80+ fluid ounces of water or unsweet tea for healthy kidneys   Limit to max 1 drink of alcohol per day; avoid smoking/tobacco   Limit animal fats in diet for cholesterol and heart health - choose grass fed whenever available   Avoid highly processed foods, and foods high in saturated/trans fats   Aim for low stress - take time to unwind and care for your mental health   Aim for 150 min of moderate intensity exercise weekly for heart health, and weights twice weekly for bone health   Aim for 7-9 hours of sleep daily   When it comes to diets, agreement about the perfect plan isnt easy to find, even among the experts. Experts at the [employer] developed an idea known as the Healthy Eating Plate. Just imagine a plate divided into logical, healthy portions.   The emphasis is on diet quality:   Load up on vegetables and fruits - one-half of your plate: Aim for color and variety, and remember that potatoes dont count.   Go for whole grains - one-quarter of your plate: Whole wheat, barley, wheat berries, quinoa, oats, brown rice, and foods made with them. If you want pasta, go with whole wheat pasta.   Protein power - one-quarter of your plate: Fish, chicken, beans, and nuts are all healthy, versatile protein sources. Limit red meat.   The diet, however, does go beyond the plate, offering a few other suggestions.   Use healthy plant oils, such as olive, canola, soy, corn, sunflower and peanut. Check the labels, and avoid partially hydrogenated oil, which have unhealthy trans fats.   If youre thirsty, drink water. Coffee  and tea are good in moderation, but skip sugary drinks and limit milk and dairy  products to one or two daily servings.   The type of carbohydrate in the diet is more important than the amount. Some sources of carbohydrates, such as vegetables, fruits, whole grains, and beans-are healthier than others.   Finally, stay active  Signed, Berniece Salines, DO  05/28/2022 9:04 AM    Thomas Knight

## 2022-05-29 ENCOUNTER — Other Ambulatory Visit: Payer: Self-pay

## 2022-06-01 MED FILL — Dexamethasone Sodium Phosphate Inj 100 MG/10ML: INTRAMUSCULAR | Qty: 1 | Status: AC

## 2022-06-01 NOTE — Progress Notes (Unsigned)
[facility]   Telephone:(336) [phone] Fax:(336) [phone]   Clinic Follow up Note   Patient Care Team: Angelina Sheriff, MD as PCP - General (Family Medicine) Berniece Salines, DO as PCP - Cardiology (Cardiology) Truitt Merle, MD as Attending Physician (Hematology and Oncology)  Date of Service:  06/02/2022  CHIEF COMPLAINT: f/u of metastatic colon cancer    CURRENT THERAPY:  FOLFOX, q14d, starting 03/03/22 -Bevacizumab added with C2 (9/20)  ASSESSMENT:  Thomas Knight is a 76 y.o. male with   Cancer of right colon ([place]) -stage II (pT3, N0), metastatic disease to lung and possible nodes in July 2023, MSS, KRAS/NRAS/BRAF wild type   -found on screening colonoscopy. S/p right hemicolectomy 01/09/21. -lung metastasis confirmed in RUL by bronchoscopy on 02/09/22. -FoundationOne showed MSS, low tumor burden, KRAS/NRAS/BRAF wild-type, no other targetable mutations. The benefit of EGFR inhibitor is much less in right-sided colon cancer, so I will not give it as first line but may consider in subsequent lines.  -he started FOLFOX on 03/03/22. He tolerated well overall with diarrhea for several days. Bevacizumab added with C2 (9/20).  -recent CT scan showed improved lung mets, but showed new infiltrative lung change, possible related to his recent URI, will repeat CT in 2 months  -pt again had severe questions about his recent CT, I reassured him that we will f/u closely and this is likely related to his recent pneumonia   Recent URI  -improved after antibiotics  -plan to repeat CT chest in 6-8 weeks    PLAN: -lab reviewed, proceed with C6 FOLFOX + Bevacizumab at a reduce dose of oxaliplatin due to his neuropathy  -lab,flush,f/u and FOLFOX 06/16/2022   SUMMARY OF ONCOLOGIC HISTORY: Oncology History  Cancer of right colon ([place])  10/2020 Procedure   Colonoscopy-Dr. Benson Norway     11/06/2020 Imaging   CT CHEST ABDOMEN PELVIS W CONTRAST   IMPRESSION: 1. 4.6 cm partially  circumferential apple-core type neoplastic process involving the mid transverse colon. No findings for locoregional adenopathy or distant metastatic disease. 2. Enlarged prostate gland with median lobe hypertrophy impressing on the base of the bladder. 3. 16 mm left thyroid nodule. Recommend follow-up thyroid ultrasound examination.   01/09/2021 Initial Diagnosis   Colon cancer ([facility])   01/09/2021 Pathology Results   B. COLON, RIGHT, RESECTION:  -  Adenocarcinoma, moderately differentiated, 3.0 cm  -  No carcinoma identified in seventeen lymph nodes (0/17)  -  Margins uninvolved by carcinoma  -  Tubular adenoma (x2)  -  Benign appendix  -  See oncology table and comment below   Margin Status for Invasive Carcinoma: All margins negative for  invasive carcinoma       Distance from Invasive Carcinoma to Radial (Circumferential)       Margin Status for Non-Invasive Tumor: All margins negative for  high-grade dysplasia / intramucosal carcinoma and low-grade dysplasia  Regional Lymph Nodes:       Number of Lymph Nodes with Tumor: 0       Number of Lymph Nodes Examined: 17  Tumor Deposits: 0  Distant Metastasis:       Distant Site(s) Involved: Not applicable  Pathologic Stage Classification (pTNM, AJCC 8th Edition): pT3, pN0   MMR Stable    01/09/2021 Cancer Staging   Staging form: Colon and Rectum, AJCC 8th Edition - Pathologic stage from 01/09/2021: Stage IIA (pT3, pN0, cM0) - Signed by Truitt Merle, MD on 02/12/2022 Total positive nodes: 0 Histologic grading system: 4 grade system  Histologic grade (G): G2 Residual tumor (R): R0 - None   05/2021 Tumor Marker   Patient's tumor was tested for the following markers: CEA. Results of the tumor marker test revealed 2.1.   12/11/2021 Procedure   Colonoscopy-Dr. Benson Norway  There was evidence of a prior functional end-to-end ileo-colonic anastomosis in the transverse colon. This was patent and was characterized by healthy appearing mucosa. The  anastomosis was traversed. Findings: Scattered small and large-mouthed diverticula were found in the sigmoid colon. - Patent functional end-to-end ileo-colonic anastomosis, characterized by healthy appearing mucosa. - Diverticulosis in the sigmoid colon. - No specimens collected.   01/25/2022 Imaging   CT CHEST ABDOMEN PELVIS W CONTRAST   IMPRESSION: 1. New right-sided pulmonary nodules measure up to 11 mm, nonspecific but concerning for metastatic disease. Consider further evaluation with nuclear medicine PET/CT. 2. New nodularity/lymph nodes in the central mesentery, concerning for nodal disease involvement. 3. Slightly increased size of prominent retroperitoneal lymph nodes, nonspecific but also somewhat concerning for nodal disease involvement. 4. Surgical change of partial colectomy without evidence of local recurrence. 5. Stable prominent mediastinal lymph nodes, nonspecific but stability is reassuring. Attention on follow-up imaging suggested. 6. Stable prominent right external iliac lymph node, nonspecific but stability is reassuring. Attention on follow-up imaging suggested. 7. Stable hypodense 8 mm hepatic lesion technically too small to accurately characterize but stability is reassuring. Attention on follow-up imaging suggested. 8. 1.9 cm incidental left thyroid nodule. Recommend thyroid US. Reference: J Am Coll Radiol. 2015 Feb;12(2): 143-50 9.  Aortic Atherosclerosis (ICD10-I70.0).   02/09/2022 Procedure   Bronchoscopy under the care of Dr. Valeta Harms    02/09/2022 Pathology Results   FINAL MICROSCOPIC DIAGNOSIS:   A. LUNG, RUL, FINE NEEDLE ASPIRATION  BIOPSY FORCEPS:  Adenocarcinoma with morphologic features consistent with metastasis from a colorectal primary.  Please see comment.   Comment: The following immunostains are performed with appropriate controls :  TTF-1: Negative .  Napsin A: Negative .  CK7: Negative.  CK20: Positive.  CDX2: Positive .   The  above immuno histochemical profile is supportive of the rendered diagnosis.    Colon cancer metastasized to lung [facility])  02/09/2022 Pathology Results   FINAL MICROSCOPIC DIAGNOSIS:   A. LUNG, RUL, FINE NEEDLE ASPIRATION  BIOPSY FORCEPS:  Adenocarcinoma with morphologic features consistent with metastasis from a colorectal primary.  Please see comment.   Comment: The following immunostains are performed with appropriate controls :  TTF-1: Negative .  Napsin A: Negative .  CK7: Negative.  CK20: Positive.  CDX2: Positive .   The above immuno histochemical profile is supportive of the rendered diagnosis.    02/12/2022 Initial Diagnosis   Colon cancer metastasized to lung [facility])   03/03/2022 -  Chemotherapy   Patient is on Treatment Plan : COLORECTAL FOLFOX + Bevacizumab q14d        INTERVAL HISTORY:  Thomas Knight is here for a follow up of metastatic colon cancer  He was last seen by me on 03/03/2022 He presents to the clinic accompanied by wife.Pt states everything still taste the same ,appetite has not picked back up.Pt states he still have a cough. Pt report a little tingly in the finger tips none in the toes.      All other systems were reviewed with the patient and are negative.  MEDICAL HISTORY:  Past Medical History:  Diagnosis Date   Bilateral leg edema 08/24/2019   Cancer (HCC)    skin ca, colon   Chronic cough  Chronic pain of left knee 12/01/2015   COVID-19    06/2019   Dysrhythmia    afib  was cardioverted   Essential hypertension 08/24/2019   Mixed hyperlipidemia 08/24/2019   Obesity (BMI 30-39.9) 08/24/2019   Persistent atrial fibrillation ([place]) 08/24/2019    SURGICAL HISTORY: Past Surgical History:  Procedure Laterality Date   ACHILLES TENDON REPAIR Left 2012   Ruptured   BRONCHIAL BIOPSY  02/09/2022   Procedure: BRONCHIAL BIOPSIES;  Surgeon: Garner Nash, DO;  Location: [facility];  Service: Pulmonary;;   BRONCHIAL NEEDLE ASPIRATION BIOPSY   02/09/2022   Procedure: BRONCHIAL NEEDLE ASPIRATION BIOPSIES;  Surgeon: Garner Nash, DO;  Location: [facility];  Service: Pulmonary;;   COLONOSCOPY WITH PROPOFOL N/A 12/11/2021   Procedure: COLONOSCOPY WITH PROPOFOL;  Surgeon: Carol Ada, MD;  Location: [facility];  Service: Gastroenterology;  Laterality: N/A;   HERNIA REPAIR     umbilical hernia   IR IMAGING GUIDED PORT INSERTION  02/18/2022   SKIN SURGERY Left    Basal cell carcinoma on left forearm   XI ROBOTIC ASSISTED LOWER ANTERIOR RESECTION N/A 01/09/2021   Procedure: XI ROBOTIC ASSISTED RIGHT HEMI COLECTOMY, LYSIS OF ADHESIONS, SMALL BOWEL RESECTION, INTRAOPERATIVE ASSESMENT OF PERFUSION, PARTIAL OMENTECTOMY;  Surgeon: Ileana Roup, MD;  Location: [facility];  Service: General;  Laterality: N/A;    I have reviewed the social history and family history with the patient and they are unchanged from previous note.  ALLERGIES:  is allergic to furosemide and tramadol.  MEDICATIONS:  Current Outpatient Medications  Medication Sig Dispense Refill   acetaminophen (TYLENOL) 500 MG tablet Take 500-1,000 mg by mouth every 6 (six) hours as needed (pain.).     amLODipine (NORVASC) 10 MG tablet Take 1 tablet (10 mg total) by mouth daily. 30 tablet 1   Ascorbic Acid (VITAMIN C WITH ROSE HIPS) 500 MG tablet Take 500 mg by mouth every evening.     Bacillus Coagulans-Inulin (ALIGN PREBIOTIC-PROBIOTIC PO) Take by mouth.     Cholecalciferol (D3-1000) 25 MCG (1000 UT) capsule Take 1,000 Units by mouth every evening.     ELIQUIS 5 MG TABS tablet Take 1 tablet (5 mg total) by mouth 2 (two) times daily. 180 tablet 1   levofloxacin (LEVAQUIN) 750 MG tablet Take 750 mg by mouth daily.     lidocaine-prilocaine (EMLA) cream Apply 1 Application topically as needed. 30 g 2   lidocaine-prilocaine (EMLA) cream Apply to affected area once 30 g 3   metoprolol tartrate (LOPRESSOR) 25 MG tablet Take 1 tablet (25 mg total) by mouth 2 (two) times daily.  180 tablet 3   Misc Natural Products (JOINT SUPPORT PO) Take 1 tablet by mouth daily. Instaflex Advanced Joint Support     nitroGLYCERIN (NITROSTAT) 0.4 MG SL tablet Place 1 tablet (0.4 mg total) under the tongue every 5 (five) minutes as needed. 25 tablet 7   ondansetron (ZOFRAN) 8 MG tablet Take 1 tablet every 8 hours as needed for nausea/vomiting starting on 3 days after chemotherapy 30 tablet 2   ondansetron (ZOFRAN) 8 MG tablet Take 1 tablet (8 mg total) by mouth every 8 (eight) hours as needed for nausea or vomiting. Start on the third day after chemotherapy. 30 tablet 1   prochlorperazine (COMPAZINE) 10 MG tablet Take 1 tablet (10 mg total) by mouth every 6 (six) hours as needed. 30 tablet 2   prochlorperazine (COMPAZINE) 10 MG tablet Take 1 tablet (10 mg total) by mouth every 6 (six) hours  as needed for nausea or vomiting. 30 tablet 1   spironolactone (ALDACTONE) 50 MG tablet Take 1 tablet (50 mg total) by mouth daily. 10 tablet 0   vitamin B-12 (CYANOCOBALAMIN) 1000 MCG tablet Take 1,000 mcg by mouth every evening.     Zinc 50 MG CAPS Take 50 mg by mouth every evening.     No current facility-administered medications for this visit.   Facility-Administered Medications Ordered in Other Visits  Medication Dose Route Frequency Provider Last Rate Last Admin   dextrose 5 % solution   Intravenous Once Truitt Merle, MD       fluorouracil (ADRUCIL) 5,700 mg in sodium chloride 0.9 % 136 mL chemo infusion  2,400 mg/m2 (Treatment Plan Recorded) Intravenous 1 day or 1 dose Truitt Merle, MD   Infusion Verify at 06/02/22 1526   heparin lock flush 100 unit/mL  500 Units Intracatheter Once PRN Truitt Merle, MD       sodium chloride flush (NS) 0.9 % injection 10 mL  10 mL Intracatheter PRN Truitt Merle, MD        PHYSICAL EXAMINATION: ECOG PERFORMANCE STATUS: 1 - Symptomatic but completely ambulatory  Vitals:   06/02/22 1103  BP: (!) 166/86  Pulse: 68  Resp: 18  Temp: 97.9 F (36.6 C)  SpO2: 99%   Wt  Readings from Last 3 Encounters:  06/02/22 236 lb 4.8 oz (107.2 kg)  05/28/22 234 lb 9.6 oz (106.4 kg)  05/26/22 232 lb 11.2 oz (105.6 kg)     GENERAL:alert, no distress and comfortable SKIN: skin color normal, no rashes or significant lesions EYES: normal, Conjunctiva are pink and non-injected, sclera clear  NEURO: alert & oriented x 3 with fluent speech  LABORATORY DATA:  I have reviewed the data as listed    Latest Ref Rng & Units 06/02/2022   10:42 AM 05/26/2022    9:04 AM 05/10/2022   10:15 AM  CBC  WBC 4.0 - 10.5 K/uL 5.8  7.1  7.2   Hemoglobin 13.0 - 17.0 g/dL 14.8  15.2  15.5   Hematocrit 39.0 - 52.0 % 43.5  45.3  44.7   Platelets 150 - 400 K/uL 189  171  119         Latest Ref Rng & Units 06/02/2022   10:42 AM 05/26/2022    9:04 AM 05/10/2022   10:15 AM  CMP  Glucose 70 - 99 mg/dL 99  110  102   BUN 8 - 23 mg/dL _0 Creatinine 0.61 - 1.24 mg/dL 0.85  1.01  1.03   Sodium 135 - 145 mmol/L 138  135  137   Potassium 3.5 - 5.1 mmol/L 4.0  3.8  3.8   Chloride 98 - 111 mmol/L 106  102  103   CO2 22 - 32 mmol/L _1 Calcium 8.9 - 10.3 mg/dL 9.4  9.4  8.6   Total Protein 6.5 - 8.1 g/dL 6.6  7.4  6.7   Total Bilirubin 0.3 - 1.2 mg/dL 0.5  0.7  0.9   Alkaline Phos 38 - 126 U/L 68  73  61   AST 15 - 41 U/L _2 ALT 0 - 44 U/L _3 RADIOGRAPHIC STUDIES: I have personally reviewed the radiological images as listed and agreed with the findings in the report. No results found.    Orders Placed This  Encounter  Procedures   CBC with Differential ([facility] Only)    Standing Status:   Future    Standing Expiration Date:   07/01/2023   CMP ([place] only)    Standing Status:   Future    Standing Expiration Date:   07/01/2023   Total Protein, Urine dipstick    Standing Status:   Future    Standing Expiration Date:   07/01/2023   CBC with Differential ([facility] Only)    Standing Status:   Future    Standing Expiration  Date:   07/15/2023   CMP ([facility] only)    Standing Status:   Future    Standing Expiration Date:   07/15/2023   Total Protein, Urine dipstick    Standing Status:   Future    Standing Expiration Date:   07/15/2023   All questions were answered. The patient knows to call the clinic with any problems, questions or concerns. No barriers to learning was detected. The total time spent in the appointment was 30 minutes.     Truitt Merle, MD 06/02/2022   Felicity Coyer, CMA, am acting as scribe for Truitt Merle, MD.   I have reviewed the above documentation for accuracy and completeness, and I agree with the above.

## 2022-06-02 ENCOUNTER — Inpatient Hospital Stay (HOSPITAL_BASED_OUTPATIENT_CLINIC_OR_DEPARTMENT_OTHER): Payer: Medicare HMO | Admitting: Hematology

## 2022-06-02 ENCOUNTER — Other Ambulatory Visit: Payer: Self-pay

## 2022-06-02 ENCOUNTER — Encounter: Payer: Self-pay | Admitting: Hematology

## 2022-06-02 ENCOUNTER — Inpatient Hospital Stay: Payer: Medicare HMO | Attending: Physician Assistant

## 2022-06-02 ENCOUNTER — Inpatient Hospital Stay: Payer: Medicare HMO

## 2022-06-02 VITALS — BP 166/86 | HR 68 | Temp 97.9°F | Resp 18 | Ht 71.0 in | Wt 236.3 lb

## 2022-06-02 DIAGNOSIS — R200 Anesthesia of skin: Secondary | ICD-10-CM | POA: Insufficient documentation

## 2022-06-02 DIAGNOSIS — C184 Malignant neoplasm of transverse colon: Secondary | ICD-10-CM | POA: Insufficient documentation

## 2022-06-02 DIAGNOSIS — C189 Malignant neoplasm of colon, unspecified: Secondary | ICD-10-CM

## 2022-06-02 DIAGNOSIS — R202 Paresthesia of skin: Secondary | ICD-10-CM | POA: Insufficient documentation

## 2022-06-02 DIAGNOSIS — Z5111 Encounter for antineoplastic chemotherapy: Secondary | ICD-10-CM | POA: Insufficient documentation

## 2022-06-02 DIAGNOSIS — C7800 Secondary malignant neoplasm of unspecified lung: Secondary | ICD-10-CM | POA: Insufficient documentation

## 2022-06-02 DIAGNOSIS — Z5112 Encounter for antineoplastic immunotherapy: Secondary | ICD-10-CM | POA: Diagnosis not present

## 2022-06-02 DIAGNOSIS — E041 Nontoxic single thyroid nodule: Secondary | ICD-10-CM | POA: Insufficient documentation

## 2022-06-02 DIAGNOSIS — R197 Diarrhea, unspecified: Secondary | ICD-10-CM | POA: Diagnosis not present

## 2022-06-02 DIAGNOSIS — Z452 Encounter for adjustment and management of vascular access device: Secondary | ICD-10-CM | POA: Diagnosis not present

## 2022-06-02 DIAGNOSIS — E860 Dehydration: Secondary | ICD-10-CM

## 2022-06-02 DIAGNOSIS — C182 Malignant neoplasm of ascending colon: Secondary | ICD-10-CM | POA: Diagnosis not present

## 2022-06-02 DIAGNOSIS — Z95828 Presence of other vascular implants and grafts: Secondary | ICD-10-CM

## 2022-06-02 LAB — CMP (CANCER CENTER ONLY)
ALT: 15 U/L (ref 0–44)
AST: 18 U/L (ref 15–41)
Albumin: 3.5 g/dL (ref 3.5–5.0)
Alkaline Phosphatase: 68 U/L (ref 38–126)
Anion gap: 4 — ABNORMAL LOW (ref 5–15)
BUN: 11 mg/dL (ref 8–23)
CO2: 28 mmol/L (ref 22–32)
Calcium: 9.4 mg/dL (ref 8.9–10.3)
Chloride: 106 mmol/L (ref 98–111)
Creatinine: 0.85 mg/dL (ref 0.61–1.24)
GFR, Estimated: >60 mL/min (ref >60)
Glucose, Bld: 99 mg/dL (ref 70–99)
Potassium: 4 mmol/L (ref 3.5–5.1)
Sodium: 138 mmol/L (ref 135–145)
Total Bilirubin: 0.5 mg/dL (ref 0.3–1.2)
Total Protein: 6.6 g/dL (ref 6.5–8.1)

## 2022-06-02 LAB — CBC WITH DIFFERENTIAL (CANCER CENTER ONLY)
Abs Immature Granulocytes: 0.01 10*3/uL (ref 0.00–0.07)
Basophils Absolute: 0.1 10*3/uL (ref 0.0–0.1)
Basophils Relative: 1 %
Eosinophils Absolute: 0.2 10*3/uL (ref 0.0–0.5)
Eosinophils Relative: 4 %
HCT: 43.5 % (ref 39.0–52.0)
Hemoglobin: 14.8 g/dL (ref 13.0–17.0)
Immature Granulocytes: 0 %
Lymphocytes Relative: 32 %
Lymphs Abs: 1.8 10*3/uL (ref 0.7–4.0)
MCH: 32.5 pg (ref 26.0–34.0)
MCHC: 34 g/dL (ref 30.0–36.0)
MCV: 95.6 fL (ref 80.0–100.0)
Monocytes Absolute: 0.7 10*3/uL (ref 0.1–1.0)
Monocytes Relative: 13 %
Neutro Abs: 2.9 10*3/uL (ref 1.7–7.7)
Neutrophils Relative %: 50 %
Platelet Count: 189 10*3/uL (ref 150–400)
RBC: 4.55 MIL/uL (ref 4.22–5.81)
RDW: 17 % — ABNORMAL HIGH (ref 11.5–15.5)
WBC Count: 5.8 10*3/uL (ref 4.0–10.5)
nRBC: 0 % (ref 0.0–0.2)

## 2022-06-02 LAB — TOTAL PROTEIN, URINE DIPSTICK: Protein, ur: <30 mg/dL — AB

## 2022-06-02 MED ORDER — SODIUM CHLORIDE 0.9 % IV SOLN: Freq: Once | INTRAVENOUS | Status: AC

## 2022-06-02 MED ORDER — OXALIPLATIN CHEMO INJECTION 100 MG/20ML
70.0000 mg/m2 | Freq: Once | INTRAVENOUS | Status: AC
  Administered 2022-06-02: 165 mg via INTRAVENOUS
  Filled 2022-06-02: qty 33

## 2022-06-02 MED ORDER — SODIUM CHLORIDE 0.9 % IV SOLN
500.0000 mg | Freq: Once | INTRAVENOUS | Status: AC
  Administered 2022-06-02: 500 mg via INTRAVENOUS
  Filled 2022-06-02: qty 16

## 2022-06-02 MED ORDER — SODIUM CHLORIDE 0.9 % IV SOLN
2400.0000 mg/m2 | INTRAVENOUS | Status: DC
  Administered 2022-06-02: 5700 mg via INTRAVENOUS
  Filled 2022-06-02: qty 114

## 2022-06-02 MED ORDER — HEPARIN SOD (PORK) LOCK FLUSH 100 UNIT/ML IV SOLN: 500.0000 [IU] | Freq: Once | INTRAVENOUS | Status: DC | PRN

## 2022-06-02 MED ORDER — PALONOSETRON HCL INJECTION 0.25 MG/5ML
0.2500 mg | Freq: Once | INTRAVENOUS | Status: AC
  Administered 2022-06-02: 0.25 mg via INTRAVENOUS
  Filled 2022-06-02: qty 5

## 2022-06-02 MED ORDER — SODIUM CHLORIDE 0.9% FLUSH
10.0000 mL | INTRAVENOUS | Status: AC | PRN
  Administered 2022-06-02: 10 mL

## 2022-06-02 MED ORDER — SODIUM CHLORIDE 0.9% FLUSH: 10.0000 mL | INTRAVENOUS | Status: DC | PRN

## 2022-06-02 MED ORDER — DEXTROSE 5 % IV SOLN: Freq: Once | INTRAVENOUS | Status: DC

## 2022-06-02 MED ORDER — SODIUM CHLORIDE 0.9 % IV SOLN
10.0000 mg | Freq: Once | INTRAVENOUS | Status: AC
  Administered 2022-06-02: 10 mg via INTRAVENOUS
  Filled 2022-06-02: qty 1
  Filled 2022-06-02: qty 10
  Filled 2022-06-02: qty 1

## 2022-06-02 MED ORDER — LEUCOVORIN CALCIUM INJECTION 350 MG
400.0000 mg/m2 | Freq: Once | INTRAVENOUS | Status: AC
  Administered 2022-06-02: 952 mg via INTRAVENOUS
  Filled 2022-06-02: qty 47.6

## 2022-06-02 MED ORDER — DEXTROSE 5 % IV SOLN: Freq: Once | INTRAVENOUS | Status: AC

## 2022-06-02 NOTE — Progress Notes (Signed)
Ok to decrease bevacizumab to 500mg  (5mg /kg) per Dr Burr Medico

## 2022-06-02 NOTE — Assessment & Plan Note (Addendum)
-  stage II (pT3, N0), metastatic disease to lung and possible nodes in July 2023, MSS, KRAS/NRAS/BRAF wild type   -found on screening colonoscopy. S/p right hemicolectomy 01/09/21. -lung metastasis confirmed in RUL by bronchoscopy on 02/09/22. -FoundationOne showed MSS, low tumor burden, KRAS/NRAS/BRAF wild-type, no other targetable mutations. The benefit of EGFR inhibitor is much less in right-sided colon cancer, so I will not give it as first line but may consider in subsequent lines.  -he started FOLFOX on 03/03/22. He tolerated well overall with diarrhea for several days. Bevacizumab added with C2 (9/20).  -recent CT scan showed improved lung mets, but showed new infiltrative lung change, possible related to his recent URI, will repeat CT in 2 months

## 2022-06-02 NOTE — Patient Instructions (Signed)
[facility] ONCOLOGY  Discharge Instructions: Thank you for choosing [facility] to provide your oncology and hematology care.   If you have a lab appointment with the [facility], please go directly to the [facility] and check in at the registration area.   Wear comfortable clothing and clothing appropriate for easy access to any Portacath or PICC line.   We strive to give you quality time with your provider. You may need to reschedule your appointment if you arrive late (15 or more minutes).  Arriving late affects you and other patients whose appointments are after yours.  Also, if you miss three or more appointments without notifying the office, you may be dismissed from the clinic at the provider's discretion.      For prescription refill requests, have your pharmacy contact our office and allow 72 hours for refills to be completed.    Today you received the following chemotherapy and/or immunotherapy agents: Zirabev, Oxaliplatin, Leucovorin, Fluorouracil.       To help prevent nausea and vomiting after your treatment, we encourage you to take your nausea medication as directed.  BELOW ARE SYMPTOMS THAT SHOULD BE REPORTED IMMEDIATELY: *FEVER GREATER THAN 100.4 F (38 C) OR HIGHER *CHILLS OR SWEATING *NAUSEA AND VOMITING THAT IS NOT CONTROLLED WITH YOUR NAUSEA MEDICATION *UNUSUAL SHORTNESS OF BREATH *UNUSUAL BRUISING OR BLEEDING *URINARY PROBLEMS (pain or burning when urinating, or frequent urination) *BOWEL PROBLEMS (unusual diarrhea, constipation, pain near the anus) TENDERNESS IN MOUTH AND THROAT WITH OR WITHOUT PRESENCE OF ULCERS (sore throat, sores in mouth, or a toothache) UNUSUAL RASH, SWELLING OR PAIN  UNUSUAL VAGINAL DISCHARGE OR ITCHING   Items with * indicate a potential emergency and should be followed up as soon as possible or go to the Emergency Department if any problems should occur.  Please show the CHEMOTHERAPY ALERT CARD or  IMMUNOTHERAPY ALERT CARD at check-in to the Emergency Department and triage nurse.  Should you have questions after your visit or need to cancel or reschedule your appointment, please contact [facility]  Dept: [phone]  and follow the prompts.  Office hours are 8:00 a.m. to 4:30 p.m. Monday - Friday. Please note that voicemails left after 4:00 p.m. may not be returned until the following business day.  We are closed weekends and major holidays. You have access to a nurse at all times for urgent questions. Please call the main number to the clinic Dept: [phone] and follow the prompts.   For any non-urgent questions, you may also contact your provider using MyChart. We now offer e-Visits for anyone 55 and older to request care online for non-urgent symptoms. For details visit [email or web address].   Also download the MyChart app! Go to the app store, search "MyChart", open the app, select [facility], and log in with your MyChart username and password.  Masks are optional in the cancer centers. If you would like for your care team to wear a mask while they are taking care of you, please let them know. You may have one support person who is at least 77 years old accompany you for your appointments.

## 2022-06-04 ENCOUNTER — Inpatient Hospital Stay (HOSPITAL_BASED_OUTPATIENT_CLINIC_OR_DEPARTMENT_OTHER): Payer: Medicare HMO

## 2022-06-04 ENCOUNTER — Other Ambulatory Visit: Payer: Self-pay

## 2022-06-04 VITALS — BP 142/65 | HR 52 | Temp 98.0°F | Resp 20

## 2022-06-04 DIAGNOSIS — Z5111 Encounter for antineoplastic chemotherapy: Secondary | ICD-10-CM | POA: Diagnosis not present

## 2022-06-04 DIAGNOSIS — Z452 Encounter for adjustment and management of vascular access device: Secondary | ICD-10-CM | POA: Diagnosis not present

## 2022-06-04 DIAGNOSIS — C7800 Secondary malignant neoplasm of unspecified lung: Secondary | ICD-10-CM | POA: Diagnosis not present

## 2022-06-04 DIAGNOSIS — R197 Diarrhea, unspecified: Secondary | ICD-10-CM | POA: Diagnosis not present

## 2022-06-04 DIAGNOSIS — Z5112 Encounter for antineoplastic immunotherapy: Secondary | ICD-10-CM | POA: Diagnosis not present

## 2022-06-04 DIAGNOSIS — R202 Paresthesia of skin: Secondary | ICD-10-CM | POA: Diagnosis not present

## 2022-06-04 DIAGNOSIS — C189 Malignant neoplasm of colon, unspecified: Secondary | ICD-10-CM

## 2022-06-04 DIAGNOSIS — C184 Malignant neoplasm of transverse colon: Secondary | ICD-10-CM | POA: Diagnosis not present

## 2022-06-04 DIAGNOSIS — E041 Nontoxic single thyroid nodule: Secondary | ICD-10-CM | POA: Diagnosis not present

## 2022-06-04 DIAGNOSIS — R200 Anesthesia of skin: Secondary | ICD-10-CM | POA: Diagnosis not present

## 2022-06-04 MED ORDER — SODIUM CHLORIDE 0.9% FLUSH
10.0000 mL | INTRAVENOUS | Status: DC | PRN
  Administered 2022-06-04: 10 mL

## 2022-06-04 MED ORDER — HEPARIN SOD (PORK) LOCK FLUSH 100 UNIT/ML IV SOLN
500.0000 [IU] | Freq: Once | INTRAVENOUS | Status: AC | PRN
  Administered 2022-06-04: 500 [IU]

## 2022-06-09 ENCOUNTER — Ambulatory Visit: Payer: Medicare HMO | Attending: Cardiology

## 2022-06-09 ENCOUNTER — Ambulatory Visit: Payer: Medicare HMO

## 2022-06-09 ENCOUNTER — Other Ambulatory Visit: Payer: Medicare HMO

## 2022-06-09 ENCOUNTER — Ambulatory Visit: Payer: Medicare HMO | Admitting: Hematology

## 2022-06-09 DIAGNOSIS — I480 Paroxysmal atrial fibrillation: Secondary | ICD-10-CM | POA: Diagnosis not present

## 2022-06-09 LAB — ECHOCARDIOGRAM COMPLETE
Area-P 1/2: 4.12 cm2
Calc EF: 52.1 %
S' Lateral: 3.1 cm
Single Plane A2C EF: 53.4 %
Single Plane A4C EF: 50.6 %

## 2022-06-14 ENCOUNTER — Other Ambulatory Visit: Payer: Self-pay

## 2022-06-14 DIAGNOSIS — I480 Paroxysmal atrial fibrillation: Secondary | ICD-10-CM

## 2022-06-14 MED ORDER — SACUBITRIL-VALSARTAN 24-26 MG PO TABS: 1.0000 | ORAL_TABLET | Freq: Two times a day (BID) | ORAL | 3 refills | Status: DC

## 2022-06-15 MED FILL — Dexamethasone Sodium Phosphate Inj 100 MG/10ML: INTRAMUSCULAR | Qty: 1 | Status: AC

## 2022-06-15 NOTE — Assessment & Plan Note (Signed)
-  stage II (pT3, N0), metastatic disease to lung and possible nodes in July 2023, MSS, KRAS/NRAS/BRAF wild type   -found on screening colonoscopy. S/p right hemicolectomy 01/09/21. -lung metastasis confirmed in RUL by bronchoscopy on 02/09/22. -FoundationOne showed MSS, low tumor burden, KRAS/NRAS/BRAF wild-type, no other targetable mutations. The benefit of EGFR inhibitor is much less in right-sided colon cancer, so I will not give it as first line but may consider in subsequent lines.  -he started FOLFOX on 03/03/22. He tolerated well overall with diarrhea for several days. Bevacizumab added with C2 (9/20).  -recent CT scan showed improved lung mets, but showed new infiltrative lung change, possible related to his recent URI, will repeat CT in 2 months

## 2022-06-16 ENCOUNTER — Encounter: Payer: Self-pay | Admitting: Hematology

## 2022-06-16 ENCOUNTER — Inpatient Hospital Stay (HOSPITAL_BASED_OUTPATIENT_CLINIC_OR_DEPARTMENT_OTHER): Payer: Medicare HMO | Admitting: Hematology

## 2022-06-16 ENCOUNTER — Inpatient Hospital Stay: Payer: Medicare HMO

## 2022-06-16 ENCOUNTER — Encounter: Payer: Medicare HMO | Admitting: Dietician

## 2022-06-16 VITALS — BP 151/74 | HR 54 | Temp 98.0°F | Resp 18 | Ht 71.0 in | Wt 238.1 lb

## 2022-06-16 DIAGNOSIS — Z452 Encounter for adjustment and management of vascular access device: Secondary | ICD-10-CM | POA: Diagnosis not present

## 2022-06-16 DIAGNOSIS — C184 Malignant neoplasm of transverse colon: Secondary | ICD-10-CM | POA: Diagnosis not present

## 2022-06-16 DIAGNOSIS — R200 Anesthesia of skin: Secondary | ICD-10-CM | POA: Diagnosis not present

## 2022-06-16 DIAGNOSIS — C189 Malignant neoplasm of colon, unspecified: Secondary | ICD-10-CM | POA: Diagnosis not present

## 2022-06-16 DIAGNOSIS — C7800 Secondary malignant neoplasm of unspecified lung: Secondary | ICD-10-CM

## 2022-06-16 DIAGNOSIS — Z5112 Encounter for antineoplastic immunotherapy: Secondary | ICD-10-CM | POA: Diagnosis not present

## 2022-06-16 DIAGNOSIS — C182 Malignant neoplasm of ascending colon: Secondary | ICD-10-CM | POA: Diagnosis not present

## 2022-06-16 DIAGNOSIS — Z5111 Encounter for antineoplastic chemotherapy: Secondary | ICD-10-CM | POA: Diagnosis not present

## 2022-06-16 DIAGNOSIS — E041 Nontoxic single thyroid nodule: Secondary | ICD-10-CM | POA: Diagnosis not present

## 2022-06-16 DIAGNOSIS — R202 Paresthesia of skin: Secondary | ICD-10-CM | POA: Diagnosis not present

## 2022-06-16 DIAGNOSIS — R197 Diarrhea, unspecified: Secondary | ICD-10-CM | POA: Diagnosis not present

## 2022-06-16 LAB — CMP (CANCER CENTER ONLY)
ALT: 14 U/L (ref 0–44)
AST: 15 U/L (ref 15–41)
Albumin: 3.5 g/dL (ref 3.5–5.0)
Alkaline Phosphatase: 62 U/L (ref 38–126)
Anion gap: 4 — ABNORMAL LOW (ref 5–15)
BUN: 12 mg/dL (ref 8–23)
CO2: 28 mmol/L (ref 22–32)
Calcium: 9.1 mg/dL (ref 8.9–10.3)
Chloride: 107 mmol/L (ref 98–111)
Creatinine: 0.9 mg/dL (ref 0.61–1.24)
GFR, Estimated: >60 mL/min (ref >60)
Glucose, Bld: 109 mg/dL — ABNORMAL HIGH (ref 70–99)
Potassium: 3.8 mmol/L (ref 3.5–5.1)
Sodium: 139 mmol/L (ref 135–145)
Total Bilirubin: 0.6 mg/dL (ref 0.3–1.2)
Total Protein: 6.6 g/dL (ref 6.5–8.1)

## 2022-06-16 LAB — CBC WITH DIFFERENTIAL (CANCER CENTER ONLY)
Abs Immature Granulocytes: 0.01 10*3/uL (ref 0.00–0.07)
Basophils Absolute: 0 10*3/uL (ref 0.0–0.1)
Basophils Relative: 1 %
Eosinophils Absolute: 0.1 10*3/uL (ref 0.0–0.5)
Eosinophils Relative: 2 %
HCT: 40.5 % (ref 39.0–52.0)
Hemoglobin: 13.9 g/dL (ref 13.0–17.0)
Immature Granulocytes: 0 %
Lymphocytes Relative: 24 %
Lymphs Abs: 1.4 10*3/uL (ref 0.7–4.0)
MCH: 33.2 pg (ref 26.0–34.0)
MCHC: 34.3 g/dL (ref 30.0–36.0)
MCV: 96.7 fL (ref 80.0–100.0)
Monocytes Absolute: 0.8 10*3/uL (ref 0.1–1.0)
Monocytes Relative: 14 %
Neutro Abs: 3.5 10*3/uL (ref 1.7–7.7)
Neutrophils Relative %: 59 %
Platelet Count: 141 10*3/uL — ABNORMAL LOW (ref 150–400)
RBC: 4.19 MIL/uL — ABNORMAL LOW (ref 4.22–5.81)
RDW: 16.8 % — ABNORMAL HIGH (ref 11.5–15.5)
WBC Count: 6 10*3/uL (ref 4.0–10.5)
nRBC: 0 % (ref 0.0–0.2)

## 2022-06-16 LAB — TOTAL PROTEIN, URINE DIPSTICK: Protein, ur: <30 mg/dL — AB

## 2022-06-16 MED ORDER — SODIUM CHLORIDE 0.9 % IV SOLN
2400.0000 mg/m2 | INTRAVENOUS | Status: DC
  Administered 2022-06-16: 5700 mg via INTRAVENOUS
  Filled 2022-06-16: qty 114

## 2022-06-16 MED ORDER — PALONOSETRON HCL INJECTION 0.25 MG/5ML
0.2500 mg | Freq: Once | INTRAVENOUS | Status: AC
  Administered 2022-06-16: 0.25 mg via INTRAVENOUS
  Filled 2022-06-16: qty 5

## 2022-06-16 MED ORDER — SODIUM CHLORIDE 0.9 % IV SOLN
5.0000 mg/kg | Freq: Once | INTRAVENOUS | Status: AC
  Administered 2022-06-16: 500 mg via INTRAVENOUS
  Filled 2022-06-16: qty 16

## 2022-06-16 MED ORDER — DEXTROSE 5 % IV SOLN: Freq: Once | INTRAVENOUS | Status: AC

## 2022-06-16 MED ORDER — SODIUM CHLORIDE 0.9 % IV SOLN
10.0000 mg | Freq: Once | INTRAVENOUS | Status: AC
  Administered 2022-06-16: 10 mg via INTRAVENOUS
  Filled 2022-06-16: qty 10

## 2022-06-16 MED ORDER — LEUCOVORIN CALCIUM INJECTION 350 MG
400.0000 mg/m2 | Freq: Once | INTRAVENOUS | Status: AC
  Administered 2022-06-16: 952 mg via INTRAVENOUS
  Filled 2022-06-16: qty 47.6

## 2022-06-16 MED ORDER — SODIUM CHLORIDE 0.9% FLUSH: 10.0000 mL | INTRAVENOUS | Status: DC | PRN

## 2022-06-16 MED ORDER — SODIUM CHLORIDE 0.9 % IV SOLN: Freq: Once | INTRAVENOUS | Status: AC

## 2022-06-16 MED ORDER — OXALIPLATIN CHEMO INJECTION 100 MG/20ML
70.0000 mg/m2 | Freq: Once | INTRAVENOUS | Status: AC
  Administered 2022-06-16: 165 mg via INTRAVENOUS
  Filled 2022-06-16: qty 33

## 2022-06-16 MED ORDER — HEPARIN SOD (PORK) LOCK FLUSH 100 UNIT/ML IV SOLN: 500.0000 [IU] | Freq: Once | INTRAVENOUS | Status: DC | PRN

## 2022-06-16 MED ORDER — DEXTROSE 5 % IV SOLN: Freq: Once | INTRAVENOUS | Status: DC

## 2022-06-16 NOTE — Progress Notes (Signed)
OK to use today's weight for dose calculations. Bevacizumab dose updated as a result.  Kennith Center, Pharm.D., CPP 06/16/2022@11 :39 AM

## 2022-06-16 NOTE — Patient Instructions (Signed)
[facility] ONCOLOGY  Discharge Instructions: Thank you for choosing [facility] to provide your oncology and hematology care.   If you have a lab appointment with the [facility], please go directly to the [facility] and check in at the registration area.   Wear comfortable clothing and clothing appropriate for easy access to any Portacath or PICC line.   We strive to give you quality time with your provider. You may need to reschedule your appointment if you arrive late (15 or more minutes).  Arriving late affects you and other patients whose appointments are after yours.  Also, if you miss three or more appointments without notifying the office, you may be dismissed from the clinic at the provider's discretion.      For prescription refill requests, have your pharmacy contact our office and allow 72 hours for refills to be completed.    Today you received the following chemotherapy and/or immunotherapy agents: Zirabev, Oxaliplatin, Leucovorin, Fluorouracil.      To help prevent nausea and vomiting after your treatment, we encourage you to take your nausea medication as directed.  BELOW ARE SYMPTOMS THAT SHOULD BE REPORTED IMMEDIATELY: *FEVER GREATER THAN 100.4 F (38 C) OR HIGHER *CHILLS OR SWEATING *NAUSEA AND VOMITING THAT IS NOT CONTROLLED WITH YOUR NAUSEA MEDICATION *UNUSUAL SHORTNESS OF BREATH *UNUSUAL BRUISING OR BLEEDING *URINARY PROBLEMS (pain or burning when urinating, or frequent urination) *BOWEL PROBLEMS (unusual diarrhea, constipation, pain near the anus) TENDERNESS IN MOUTH AND THROAT WITH OR WITHOUT PRESENCE OF ULCERS (sore throat, sores in mouth, or a toothache) UNUSUAL RASH, SWELLING OR PAIN  UNUSUAL VAGINAL DISCHARGE OR ITCHING   Items with * indicate a potential emergency and should be followed up as soon as possible or go to the Emergency Department if any problems should occur.  Please show the CHEMOTHERAPY ALERT CARD or  IMMUNOTHERAPY ALERT CARD at check-in to the Emergency Department and triage nurse.  Should you have questions after your visit or need to cancel or reschedule your appointment, please contact [facility]  Dept: [phone]  and follow the prompts.  Office hours are 8:00 a.m. to 4:30 p.m. Monday - Friday. Please note that voicemails left after 4:00 p.m. may not be returned until the following business day.  We are closed weekends and major holidays. You have access to a nurse at all times for urgent questions. Please call the main number to the clinic Dept: [phone] and follow the prompts.   For any non-urgent questions, you may also contact your provider using MyChart. We now offer e-Visits for anyone 63 and older to request care online for non-urgent symptoms. For details visit [email or web address].   Also download the MyChart app! Go to the app store, search "MyChart", open the app, select [facility], and log in with your MyChart username and password.  Masks are optional in the cancer centers. If you would like for your care team to wear a mask while they are taking care of you, please let them know. You may have one support person who is at least 77 years old accompany you for your appointments.

## 2022-06-16 NOTE — Progress Notes (Signed)
[facility]   [phone] [phone]   Clinic Follow up Note   Patient Care Team: Angelina Sheriff, MD as PCP - General (Family Medicine) Berniece Salines, DO as PCP - Cardiology (Cardiology) Truitt Merle, MD as Attending Physician (Hematology and Oncology)  Date of Service:  06/16/2022  CHIEF COMPLAINT: f/u of  metastatic colon cancer      CURRENT THERAPY:  FOLFOX, q14d, starting 03/03/22 -Bevacizumab added with C2 (9/20    ASSESSMENT:  Thomas Knight is a 76 y.o. male with   Cancer of right colon ([place]) -stage II (pT3, N0), metastatic disease to lung and possible nodes in July 2023, MSS, KRAS/NRAS/BRAF wild type   -found on screening colonoscopy. S/p right hemicolectomy 01/09/21. -lung metastasis confirmed in RUL by bronchoscopy on 02/09/22. -[facility] showed MSS, low tumor burden, KRAS/NRAS/BRAF wild-type, no other targetable mutations. The benefit of EGFR inhibitor is much less in right-sided colon cancer, so I will not give it as first line but may consider in subsequent lines.  -he started FOLFOX on 03/03/22. He tolerated well overall with diarrhea for several days. Bevacizumab added with C2 (9/20).  -recent CT scan showed improved lung mets, but showed new infiltrative lung change, possible related to his recent URI, will repeat CT in 2 months    PLAN: --pt is clinically doing well, lab reviewed, proceed with C7 FOLFOX + Bevacizumab at same reduce dose of oxaliplatin due to his neuropathy   -CT Scan In late Jan, ordered today  Lab, flush, f/u and chemo FOLFOX and beva in 2 and 4 weeks   SUMMARY OF ONCOLOGIC HISTORY: Oncology History  Cancer of right colon ([place])  10/2020 Procedure   Colonoscopy-Dr. Benson Norway     11/06/2020 Imaging   CT CHEST ABDOMEN PELVIS W CONTRAST   IMPRESSION: 1. 4.6 cm partially circumferential apple-core type neoplastic process involving the mid transverse colon. No findings for locoregional adenopathy or distant  metastatic disease. 2. Enlarged prostate gland with median lobe hypertrophy impressing on the base of the bladder. 3. 16 mm left thyroid nodule. Recommend follow-up thyroid ultrasound examination.   01/09/2021 Initial Diagnosis   Colon cancer ([place])   01/09/2021 Pathology Results   B. COLON, RIGHT, RESECTION:  -  Adenocarcinoma, moderately differentiated, 3.0 cm  -  No carcinoma identified in seventeen lymph nodes (0/17)  -  Margins uninvolved by carcinoma  -  Tubular adenoma (x2)  -  Benign appendix  -  See oncology table and comment below   Margin Status for Invasive Carcinoma: All margins negative for  invasive carcinoma       Distance from Invasive Carcinoma to Radial (Circumferential)       Margin Status for Non-Invasive Tumor: All margins negative for  high-grade dysplasia / intramucosal carcinoma and low-grade dysplasia  Regional Lymph Nodes:       Number of Lymph Nodes with Tumor: 0       Number of Lymph Nodes Examined: 17  Tumor Deposits: 0  Distant Metastasis:       Distant Site(s) Involved: Not applicable  Pathologic Stage Classification (pTNM, AJCC 8th Edition): pT3, pN0   MMR Stable    01/09/2021 Cancer Staging   Staging form: Colon and Rectum, AJCC 8th Edition - Pathologic stage from 01/09/2021: Stage IIA (pT3, pN0, cM0) - Signed by Truitt Merle, MD on 02/12/2022 Total positive nodes: 0 Histologic grading system: 4 grade system Histologic grade (G): G2 Residual tumor (R): R0 - None   05/2021 Tumor Marker  Patient's tumor was tested for the following markers: CEA. Results of the tumor marker test revealed 2.1.   12/11/2021 Procedure   Colonoscopy-Dr. Benson Norway  There was evidence of a prior functional end-to-end ileo-colonic anastomosis in the transverse colon. This was patent and was characterized by healthy appearing mucosa. The anastomosis was traversed. Findings: Scattered small and large-mouthed diverticula were found in the sigmoid colon. - Patent functional  end-to-end ileo-colonic anastomosis, characterized by healthy appearing mucosa. - Diverticulosis in the sigmoid colon. - No specimens collected.   01/25/2022 Imaging   CT CHEST ABDOMEN PELVIS W CONTRAST   IMPRESSION: 1. New right-sided pulmonary nodules measure up to 11 mm, nonspecific but concerning for metastatic disease. Consider further evaluation with nuclear medicine PET/CT. 2. New nodularity/lymph nodes in the central mesentery, concerning for nodal disease involvement. 3. Slightly increased size of prominent retroperitoneal lymph nodes, nonspecific but also somewhat concerning for nodal disease involvement. 4. Surgical change of partial colectomy without evidence of local recurrence. 5. Stable prominent mediastinal lymph nodes, nonspecific but stability is reassuring. Attention on follow-up imaging suggested. 6. Stable prominent right external iliac lymph node, nonspecific but stability is reassuring. Attention on follow-up imaging suggested. 7. Stable hypodense 8 mm hepatic lesion technically too small to accurately characterize but stability is reassuring. Attention on follow-up imaging suggested. 8. 1.9 cm incidental left thyroid nodule. Recommend thyroid US. Reference: J Am Coll Radiol. 2015 Feb;12(2): 143-50 9.  Aortic Atherosclerosis (ICD10-I70.0).   02/09/2022 Procedure   Bronchoscopy under the care of Dr. Valeta Harms    02/09/2022 Pathology Results   FINAL MICROSCOPIC DIAGNOSIS:   A. LUNG, RUL, FINE NEEDLE ASPIRATION  BIOPSY FORCEPS:  Adenocarcinoma with morphologic features consistent with metastasis from a colorectal primary.  Please see comment.   Comment: The following immunostains are performed with appropriate controls :  TTF-1: Negative .  Napsin A: Negative .  CK7: Negative.  CK20: Positive.  CDX2: Positive .   The above immuno histochemical profile is supportive of the rendered diagnosis.    Colon cancer metastasized to lung [facility])  02/09/2022 Pathology  Results   FINAL MICROSCOPIC DIAGNOSIS:   A. LUNG, RUL, FINE NEEDLE ASPIRATION  BIOPSY FORCEPS:  Adenocarcinoma with morphologic features consistent with metastasis from a colorectal primary.  Please see comment.   Comment: The following immunostains are performed with appropriate controls :  TTF-1: Negative .  Napsin A: Negative .  CK7: Negative.  CK20: Positive.  CDX2: Positive .   The above immuno histochemical profile is supportive of the rendered diagnosis.    02/12/2022 Initial Diagnosis   Colon cancer metastasized to lung [facility])   03/03/2022 -  Chemotherapy   Patient is on Treatment Plan : COLORECTAL FOLFOX + Bevacizumab q14d        INTERVAL HISTORY:  Thomas Knight is here for a follow up of   metastatic colon cancer   He was last seen by me on 06/02/22 He presents to the clinic accompanied wife.Pt has report of having SOB, but denies it ever giving him trouble.He hasn't walk due to the temperature. Pt states he has a history of coughing when seasons change, but he has a persistent cough for 8 wks. Pt reports of having diarrhea two days after of chemo and still have numbness and tingling.     All other systems were reviewed with the patient and are negative.  MEDICAL HISTORY:  Past Medical History:  Diagnosis Date   Bilateral leg edema 08/24/2019   Cancer ([place])    skin ca,  colon   Chronic cough    Chronic pain of left knee 12/01/2015   COVID-19    06/2019   Dysrhythmia    afib  was cardioverted   Essential hypertension 08/24/2019   Mixed hyperlipidemia 08/24/2019   Obesity (BMI 30-39.9) 08/24/2019   Persistent atrial fibrillation ([place]) 08/24/2019    SURGICAL HISTORY: Past Surgical History:  Procedure Laterality Date   ACHILLES TENDON REPAIR Left 2012   Ruptured   BRONCHIAL BIOPSY  02/09/2022   Procedure: BRONCHIAL BIOPSIES;  Surgeon: Garner Nash, DO;  Location: [facility];  Service: Pulmonary;;   BRONCHIAL NEEDLE ASPIRATION BIOPSY  02/09/2022    Procedure: BRONCHIAL NEEDLE ASPIRATION BIOPSIES;  Surgeon: Garner Nash, DO;  Location: [facility];  Service: Pulmonary;;   COLONOSCOPY WITH PROPOFOL N/A 12/11/2021   Procedure: COLONOSCOPY WITH PROPOFOL;  Surgeon: Carol Ada, MD;  Location: [facility];  Service: Gastroenterology;  Laterality: N/A;   HERNIA REPAIR     umbilical hernia   IR IMAGING GUIDED PORT INSERTION  02/18/2022   SKIN SURGERY Left    Basal cell carcinoma on left forearm   XI ROBOTIC ASSISTED LOWER ANTERIOR RESECTION N/A 01/09/2021   Procedure: XI ROBOTIC ASSISTED RIGHT HEMI COLECTOMY, LYSIS OF ADHESIONS, SMALL BOWEL RESECTION, INTRAOPERATIVE ASSESMENT OF PERFUSION, PARTIAL OMENTECTOMY;  Surgeon: Ileana Roup, MD;  Location: [facility];  Service: General;  Laterality: N/A;    I have reviewed the social history and family history with the patient and they are unchanged from previous note.  ALLERGIES:  is allergic to furosemide and tramadol.  MEDICATIONS:  Current Outpatient Medications  Medication Sig Dispense Refill   acetaminophen (TYLENOL) 500 MG tablet Take 500-1,000 mg by mouth every 6 (six) hours as needed (pain.).     amLODipine (NORVASC) 10 MG tablet Take 1 tablet (10 mg total) by mouth daily. 30 tablet 1   Ascorbic Acid (VITAMIN C WITH ROSE HIPS) 500 MG tablet Take 500 mg by mouth every evening.     Bacillus Coagulans-Inulin (ALIGN PREBIOTIC-PROBIOTIC PO) Take by mouth.     Cholecalciferol (D3-1000) 25 MCG (1000 UT) capsule Take 1,000 Units by mouth every evening.     ELIQUIS 5 MG TABS tablet Take 1 tablet (5 mg total) by mouth 2 (two) times daily. 180 tablet 1   levofloxacin (LEVAQUIN) 750 MG tablet Take 750 mg by mouth daily.     lidocaine-prilocaine (EMLA) cream Apply 1 Application topically as needed. 30 g 2   lidocaine-prilocaine (EMLA) cream Apply to affected area once 30 g 3   metoprolol tartrate (LOPRESSOR) 25 MG tablet Take 1 tablet (25 mg total) by mouth 2 (two) times daily. 180 tablet 3    Misc Natural Products (JOINT SUPPORT PO) Take 1 tablet by mouth daily. Instaflex Advanced Joint Support     nitroGLYCERIN (NITROSTAT) 0.4 MG SL tablet Place 1 tablet (0.4 mg total) under the tongue every 5 (five) minutes as needed. 25 tablet 7   ondansetron (ZOFRAN) 8 MG tablet Take 1 tablet every 8 hours as needed for nausea/vomiting starting on 3 days after chemotherapy 30 tablet 2   ondansetron (ZOFRAN) 8 MG tablet Take 1 tablet (8 mg total) by mouth every 8 (eight) hours as needed for nausea or vomiting. Start on the third day after chemotherapy. 30 tablet 1   prochlorperazine (COMPAZINE) 10 MG tablet Take 1 tablet (10 mg total) by mouth every 6 (six) hours as needed. 30 tablet 2   prochlorperazine (COMPAZINE) 10 MG tablet Take 1 tablet (10  mg total) by mouth every 6 (six) hours as needed for nausea or vomiting. 30 tablet 1   sacubitril-valsartan (ENTRESTO) 24-26 MG Take 1 tablet by mouth 2 (two) times daily. 180 tablet 3   spironolactone (ALDACTONE) 50 MG tablet Take 1 tablet (50 mg total) by mouth daily. 10 tablet 0   vitamin B-12 (CYANOCOBALAMIN) 1000 MCG tablet Take 1,000 mcg by mouth every evening.     Zinc 50 MG CAPS Take 50 mg by mouth every evening.     No current facility-administered medications for this visit.   Facility-Administered Medications Ordered in Other Visits  Medication Dose Route Frequency Provider Last Rate Last Admin   dextrose 5 % solution   Intravenous Once Truitt Merle, MD       fluorouracil (ADRUCIL) 5,700 mg in sodium chloride 0.9 % 136 mL chemo infusion  2,400 mg/m2 (Treatment Plan Recorded) Intravenous 1 day or 1 dose Truitt Merle, MD   Infusion Verify at 06/16/22 1450   heparin lock flush 100 unit/mL  500 Units Intracatheter Once PRN Truitt Merle, MD       sodium chloride flush (NS) 0.9 % injection 10 mL  10 mL Intracatheter PRN Truitt Merle, MD        PHYSICAL EXAMINATION: ECOG PERFORMANCE STATUS: 1 - Symptomatic but completely ambulatory  Vitals:   06/16/22 1058   BP: (!) 151/74  Pulse: (!) 54  Resp: 18  Temp: 98 F (36.7 C)  SpO2: 98%   Wt Readings from Last 3 Encounters:  06/16/22 238 lb 1.6 oz (108 kg)  06/02/22 236 lb 4.8 oz (107.2 kg)  05/28/22 234 lb 9.6 oz (106.4 kg)     GENERAL:alert, no distress and comfortable SKIN: skin color normal, no rashes or significant lesions EYES: normal, Conjunctiva are pink and non-injected, sclera clear  NEURO: alert & oriented x 3 with fluent speech LABORATORY DATA:  I have reviewed the data as listed    Latest Ref Rng & Units 06/16/2022   10:18 AM 06/02/2022   10:42 AM 05/26/2022    9:04 AM  CBC  WBC 4.0 - 10.5 K/uL 6.0  5.8  7.1   Hemoglobin 13.0 - 17.0 g/dL 13.9  14.8  15.2   Hematocrit 39.0 - 52.0 % 40.5  43.5  45.3   Platelets 150 - 400 K/uL 141  189  171         Latest Ref Rng & Units 06/16/2022   10:18 AM 06/02/2022   10:42 AM 05/26/2022    9:04 AM  CMP  Glucose 70 - 99 mg/dL 109  99  110   BUN 8 - 23 mg/dL _0 Creatinine 0.61 - 1.24 mg/dL 0.90  0.85  1.01   Sodium 135 - 145 mmol/L 139  138  135   Potassium 3.5 - 5.1 mmol/L 3.8  4.0  3.8   Chloride 98 - 111 mmol/L 107  106  102   CO2 22 - 32 mmol/L _1 Calcium 8.9 - 10.3 mg/dL 9.1  9.4  9.4   Total Protein 6.5 - 8.1 g/dL 6.6  6.6  7.4   Total Bilirubin 0.3 - 1.2 mg/dL 0.6  0.5  0.7   Alkaline Phos 38 - 126 U/L 62  68  73   AST 15 - 41 U/L _2 ALT 0 - 44 U/L _3 RADIOGRAPHIC STUDIES:  I have personally reviewed the radiological images as listed and agreed with the findings in the report. No results found.    Orders Placed This Encounter  Procedures   CT CHEST WO CONTRAST    Standing Status:   Future    Standing Expiration Date:   06/17/2023    Order Specific Question:   Preferred imaging location?    Answer:   [facility]    Order Specific Question:   Release to patient    Answer:   Immediate   All questions were answered. The patient knows to call the clinic with  any problems, questions or concerns. No barriers to learning was detected. The total time spent in the appointment was 30 minutes.     Truitt Merle, MD 06/16/2022   Felicity Coyer, CMA, am acting as scribe for Truitt Merle, MD.   I have reviewed the above documentation for accuracy and completeness, and I agree with the above.

## 2022-06-17 ENCOUNTER — Telehealth: Payer: Self-pay | Admitting: Hematology

## 2022-06-17 NOTE — Telephone Encounter (Signed)
Patient aware of upcoming appointments  

## 2022-06-18 ENCOUNTER — Inpatient Hospital Stay: Payer: Medicare HMO

## 2022-06-18 VITALS — BP 142/78 | HR 57 | Temp 98.4°F | Resp 20

## 2022-06-18 DIAGNOSIS — C189 Malignant neoplasm of colon, unspecified: Secondary | ICD-10-CM

## 2022-06-18 DIAGNOSIS — Z5111 Encounter for antineoplastic chemotherapy: Secondary | ICD-10-CM | POA: Diagnosis not present

## 2022-06-18 DIAGNOSIS — C184 Malignant neoplasm of transverse colon: Secondary | ICD-10-CM | POA: Diagnosis not present

## 2022-06-18 DIAGNOSIS — R197 Diarrhea, unspecified: Secondary | ICD-10-CM | POA: Diagnosis not present

## 2022-06-18 DIAGNOSIS — R202 Paresthesia of skin: Secondary | ICD-10-CM | POA: Diagnosis not present

## 2022-06-18 DIAGNOSIS — Z5112 Encounter for antineoplastic immunotherapy: Secondary | ICD-10-CM | POA: Diagnosis not present

## 2022-06-18 DIAGNOSIS — R200 Anesthesia of skin: Secondary | ICD-10-CM | POA: Diagnosis not present

## 2022-06-18 DIAGNOSIS — E041 Nontoxic single thyroid nodule: Secondary | ICD-10-CM | POA: Diagnosis not present

## 2022-06-18 DIAGNOSIS — C7800 Secondary malignant neoplasm of unspecified lung: Secondary | ICD-10-CM | POA: Diagnosis not present

## 2022-06-18 DIAGNOSIS — Z452 Encounter for adjustment and management of vascular access device: Secondary | ICD-10-CM | POA: Diagnosis not present

## 2022-06-18 MED ORDER — SODIUM CHLORIDE 0.9% FLUSH
10.0000 mL | INTRAVENOUS | Status: DC | PRN
  Administered 2022-06-18: 10 mL

## 2022-06-18 MED ORDER — HEPARIN SOD (PORK) LOCK FLUSH 100 UNIT/ML IV SOLN
500.0000 [IU] | Freq: Once | INTRAVENOUS | Status: AC | PRN
  Administered 2022-06-18: 500 [IU]

## 2022-06-27 DIAGNOSIS — I10 Essential (primary) hypertension: Secondary | ICD-10-CM | POA: Diagnosis not present

## 2022-06-27 DIAGNOSIS — E785 Hyperlipidemia, unspecified: Secondary | ICD-10-CM | POA: Diagnosis not present

## 2022-06-29 MED FILL — Dexamethasone Sodium Phosphate Inj 100 MG/10ML: INTRAMUSCULAR | Qty: 1 | Status: AC

## 2022-06-29 NOTE — Progress Notes (Unsigned)
Thomas Knight   [phone] [phone]   Clinic Follow up Note   Patient Care Team: Angelina Sheriff, MD as PCP - General (Family Medicine) Berniece Salines, DO as PCP - Cardiology (Cardiology) Truitt Merle, MD as Attending Physician (Hematology and Oncology)  Date of Service:  06/30/2022  CHIEF COMPLAINT: f/u of   metastatic colon cancer      CURRENT THERAPY:   FOLFOX, q14d, starting 03/03/22 -Bevacizumab added with C2 (9/20    ASSESSMENT:  Thomas Knight is a 77 y.o. male with   Cancer of right colon ([place]) -stage II (pT3, N0), metastatic disease to lung and possible nodes in July 2023, MSS, KRAS/NRAS/BRAF wild type   -found on screening colonoscopy. S/p right hemicolectomy 01/09/21. -lung metastasis confirmed in RUL by bronchoscopy on 02/09/22. -FoundationOne showed MSS, low tumor burden, KRAS/NRAS/BRAF wild-type, no other targetable mutations. The benefit of EGFR inhibitor is much less in right-sided colon cancer, so I will not give it as first line but may consider in subsequent lines.  -he started FOLFOX on 03/03/22. He tolerated well overall with diarrhea for several days. Bevacizumab added with C2 (9/20).  -recent CT scan showed improved lung mets, but showed new infiltrative lung change, possible related to his recent URI, will repeat CT late jan    PLAN: -lab reviewed -proceed with C8 FOLFOX + Bevacizumab at same reduce dose of oxaliplatin due to his neuropathy   -lab f/u o 07/28/22 -pt will call radiology to schedule his CT the last week of Jan    SUMMARY OF ONCOLOGIC HISTORY: Oncology History  Cancer of right colon ([place])  10/2020 Procedure   Colonoscopy-Dr. Benson Norway     11/06/2020 Imaging   CT CHEST ABDOMEN PELVIS W CONTRAST   IMPRESSION: 1. 4.6 cm partially circumferential apple-core type neoplastic process involving the mid transverse colon. No findings for locoregional adenopathy or distant metastatic disease. 2. Enlarged prostate  gland with median lobe hypertrophy impressing on the base of the bladder. 3. 16 mm left thyroid nodule. Recommend follow-up thyroid ultrasound examination.   01/09/2021 Initial Diagnosis   Colon cancer ([place])   01/09/2021 Pathology Results   B. COLON, RIGHT, RESECTION:  -  Adenocarcinoma, moderately differentiated, 3.0 cm  -  No carcinoma identified in seventeen lymph nodes (0/17)  -  Margins uninvolved by carcinoma  -  Tubular adenoma (x2)  -  Benign appendix  -  See oncology table and comment below   Margin Status for Invasive Carcinoma: All margins negative for  invasive carcinoma       Distance from Invasive Carcinoma to Radial (Circumferential)       Margin Status for Non-Invasive Tumor: All margins negative for  high-grade dysplasia / intramucosal carcinoma and low-grade dysplasia  Regional Lymph Nodes:       Number of Lymph Nodes with Tumor: 0       Number of Lymph Nodes Examined: 17  Tumor Deposits: 0  Distant Metastasis:       Distant Site(s) Involved: Not applicable  Pathologic Stage Classification (pTNM, AJCC 8th Edition): pT3, pN0   MMR Stable    01/09/2021 Cancer Staging   Staging form: Colon and Rectum, AJCC 8th Edition - Pathologic stage from 01/09/2021: Stage IIA (pT3, pN0, cM0) - Signed by Truitt Merle, MD on 02/12/2022 Total positive nodes: 0 Histologic grading system: 4 grade system Histologic grade (G): G2 Residual tumor (R): R0 - None   05/2021 Tumor Marker   Patient's tumor was tested for the  following markers: CEA. Results of the tumor marker test revealed 2.1.   12/11/2021 Procedure   Colonoscopy-Dr. Benson Norway  There was evidence of a prior functional end-to-end ileo-colonic anastomosis in the transverse colon. This was patent and was characterized by healthy appearing mucosa. The anastomosis was traversed. Findings: Scattered small and large-mouthed diverticula were found in the sigmoid colon. - Patent functional end-to-end ileo-colonic anastomosis,  characterized by healthy appearing mucosa. - Diverticulosis in the sigmoid colon. - No specimens collected.   01/25/2022 Imaging   CT CHEST ABDOMEN PELVIS W CONTRAST   IMPRESSION: 1. New right-sided pulmonary nodules measure up to 11 mm, nonspecific but concerning for metastatic disease. Consider further evaluation with nuclear medicine PET/CT. 2. New nodularity/lymph nodes in the central mesentery, concerning for nodal disease involvement. 3. Slightly increased size of prominent retroperitoneal lymph nodes, nonspecific but also somewhat concerning for nodal disease involvement. 4. Surgical change of partial colectomy without evidence of local recurrence. 5. Stable prominent mediastinal lymph nodes, nonspecific but stability is reassuring. Attention on follow-up imaging suggested. 6. Stable prominent right external iliac lymph node, nonspecific but stability is reassuring. Attention on follow-up imaging suggested. 7. Stable hypodense 8 mm hepatic lesion technically too small to accurately characterize but stability is reassuring. Attention on follow-up imaging suggested. 8. 1.9 cm incidental left thyroid nodule. Recommend thyroid US. Reference: J Am Coll Radiol. 2015 Feb;12(2): 143-50 9.  Aortic Atherosclerosis (ICD10-I70.0).   02/09/2022 Procedure   Bronchoscopy under the care of Dr. Valeta Harms    02/09/2022 Pathology Results   FINAL MICROSCOPIC DIAGNOSIS:   A. LUNG, RUL, FINE NEEDLE ASPIRATION  BIOPSY FORCEPS:  Adenocarcinoma with morphologic features consistent with metastasis from a colorectal primary.  Please see comment.   Comment: The following immunostains are performed with appropriate controls :  TTF-1: Negative .  Napsin A: Negative .  CK7: Negative.  CK20: Positive.  CDX2: Positive .   The above immuno histochemical profile is supportive of the rendered diagnosis.    Colon cancer metastasized to lung [facility])  02/09/2022 Pathology Results   FINAL MICROSCOPIC  DIAGNOSIS:   A. LUNG, RUL, FINE NEEDLE ASPIRATION  BIOPSY FORCEPS:  Adenocarcinoma with morphologic features consistent with metastasis from a colorectal primary.  Please see comment.   Comment: The following immunostains are performed with appropriate controls :  TTF-1: Negative .  Napsin A: Negative .  CK7: Negative.  CK20: Positive.  CDX2: Positive .   The above immuno histochemical profile is supportive of the rendered diagnosis.    02/12/2022 Initial Diagnosis   Colon cancer metastasized to lung [facility])   03/03/2022 -  Chemotherapy   Patient is on Treatment Plan : COLORECTAL FOLFOX + Bevacizumab q14d        INTERVAL HISTORY:  Thomas Knight is here for a follow up of metastatic colon cancer    He was last seen by  me on 06/16/2022  He presents to the clinic with family member. Pt reports of having diarrhea.Wondering what he can do to decrease the diarrhea.Pt report that his appetite has been good but don't have an appetite after having chemo. He reports of having some numbness ina tingling in finger tips. Pt stated his cough has subsided. Pt denies SOB    All other systems were reviewed with the patient and are negative.  MEDICAL HISTORY:  Past Medical History:  Diagnosis Date   Bilateral leg edema 08/24/2019   Cancer (HCC)    skin ca, colon   Chronic cough    Chronic pain of  left knee 12/01/2015   COVID-19    06/2019   Dysrhythmia    afib  was cardioverted   Essential hypertension 08/24/2019   Mixed hyperlipidemia 08/24/2019   Obesity (BMI 30-39.9) 08/24/2019   Persistent atrial fibrillation ([place]) 08/24/2019    SURGICAL HISTORY: Past Surgical History:  Procedure Laterality Date   ACHILLES TENDON REPAIR Left 2012   Ruptured   BRONCHIAL BIOPSY  02/09/2022   Procedure: BRONCHIAL BIOPSIES;  Surgeon: Garner Nash, DO;  Location: [facility]COPY;  Service: Pulmonary;;   BRONCHIAL NEEDLE ASPIRATION BIOPSY  02/09/2022   Procedure: BRONCHIAL NEEDLE ASPIRATION BIOPSIES;   Surgeon: Garner Nash, DO;  Location: [place] [facility];  Service: Pulmonary;;   COLONOSCOPY WITH PROPOFOL N/A 12/11/2021   Procedure: COLONOSCOPY WITH PROPOFOL;  Surgeon: Carol Ada, MD;  Location: [facility] ENDOSCOPY;  Service: Gastroenterology;  Laterality: N/A;   HERNIA REPAIR     umbilical hernia   IR IMAGING GUIDED PORT INSERTION  02/18/2022   SKIN SURGERY Left    Basal cell carcinoma on left forearm   XI ROBOTIC ASSISTED LOWER ANTERIOR RESECTION N/A 01/09/2021   Procedure: XI ROBOTIC ASSISTED RIGHT HEMI COLECTOMY, LYSIS OF ADHESIONS, SMALL BOWEL RESECTION, INTRAOPERATIVE ASSESMENT OF PERFUSION, PARTIAL OMENTECTOMY;  Surgeon: Ileana Roup, MD;  Location: [facility] ORS;  Service: General;  Laterality: N/A;    I have reviewed the social history and family history with the patient and they are unchanged from previous note.  ALLERGIES:  is allergic to furosemide and tramadol.  MEDICATIONS:  Current Outpatient Medications  Medication Sig Dispense Refill   acetaminophen (TYLENOL) 500 MG tablet Take 500-1,000 mg by mouth every 6 (six) hours as needed (pain.).     amLODipine (NORVASC) 10 MG tablet Take 1 tablet (10 mg total) by mouth daily. 30 tablet 1   Ascorbic Acid (VITAMIN C WITH ROSE HIPS) 500 MG tablet Take 500 mg by mouth every evening.     Bacillus Coagulans-Inulin (ALIGN PREBIOTIC-PROBIOTIC PO) Take by mouth.     Cholecalciferol (D3-1000) 25 MCG (1000 UT) capsule Take 1,000 Units by mouth every evening.     ELIQUIS 5 MG TABS tablet Take 1 tablet (5 mg total) by mouth 2 (two) times daily. 180 tablet 1   levofloxacin (LEVAQUIN) 750 MG tablet Take 750 mg by mouth daily.     lidocaine-prilocaine (EMLA) cream Apply 1 Application topically as needed. 30 g 2   lidocaine-prilocaine (EMLA) cream Apply to affected area once 30 g 3   metoprolol tartrate (LOPRESSOR) 25 MG tablet Take 1 tablet (25 mg total) by mouth 2 (two) times daily. 180 tablet 3   Misc Natural Products (JOINT SUPPORT PO) Take 1  tablet by mouth daily. Instaflex Advanced Joint Support     nitroGLYCERIN (NITROSTAT) 0.4 MG SL tablet Place 1 tablet (0.4 mg total) under the tongue every 5 (five) minutes as needed. 25 tablet 7   ondansetron (ZOFRAN) 8 MG tablet Take 1 tablet every 8 hours as needed for nausea/vomiting starting on 3 days after chemotherapy 30 tablet 2   ondansetron (ZOFRAN) 8 MG tablet Take 1 tablet (8 mg total) by mouth every 8 (eight) hours as needed for nausea or vomiting. Start on the third day after chemotherapy. 30 tablet 1   prochlorperazine (COMPAZINE) 10 MG tablet Take 1 tablet (10 mg total) by mouth every 6 (six) hours as needed. 30 tablet 2   prochlorperazine (COMPAZINE) 10 MG tablet Take 1 tablet (10 mg total) by mouth every 6 (six) hours as needed for  nausea or vomiting. 30 tablet 1   sacubitril-valsartan (ENTRESTO) 24-26 MG Take 1 tablet by mouth 2 (two) times daily. 180 tablet 3   spironolactone (ALDACTONE) 50 MG tablet Take 1 tablet (50 mg total) by mouth daily. 10 tablet 0   vitamin B-12 (CYANOCOBALAMIN) 1000 MCG tablet Take 1,000 mcg by mouth every evening.     Zinc 50 MG CAPS Take 50 mg by mouth every evening.     No current facility-administered medications for this visit.   Facility-Administered Medications Ordered in Other Visits  Medication Dose Route Frequency Provider Last Rate Last Admin   fluorouracil (ADRUCIL) 5,600 mg in sodium chloride 0.9 % 138 mL chemo infusion  2,400 mg/m2 (Treatment Plan Recorded) Intravenous 1 day or 1 dose Truitt Merle, MD   Infusion Verify at 06/30/22 1437   heparin lock flush 100 unit/mL  500 Units Intracatheter Once PRN Truitt Merle, MD       sodium chloride flush (NS) 0.9 % injection 10 mL  10 mL Intracatheter PRN Truitt Merle, MD        PHYSICAL EXAMINATION: ECOG PERFORMANCE STATUS: 1 - Symptomatic but completely ambulatory  Vitals:   06/30/22 0949  BP: (!) 149/94  Pulse: 69  Resp: 18  Temp: 98.2 F (36.8 C)  SpO2: 98%   Wt Readings from Last 3  Encounters:  06/30/22 236 lb 12.8 oz (107.4 kg)  06/16/22 238 lb 1.6 oz (108 kg)  06/02/22 236 lb 4.8 oz (107.2 kg)     GENERAL:alert, no distress and comfortable SKIN: skin color normal, no rashes or significant lesions EYES: normal, Conjunctiva are pink and non-injected, sclera clear  NEURO: alert & oriented x 3 with fluent speech GENERAL:alert, no distress and comfortable SKIN: skin color, texture, turgor are normal, no rashes or significant lesions EYES: normal, Conjunctiva are pink and non-injected, sclera clear   LABORATORY DATA:  I have reviewed the data as listed    Latest Ref Rng & Units 06/30/2022    9:09 AM 06/16/2022   10:18 AM 06/02/2022   10:42 AM  CBC  WBC 4.0 - 10.5 K/uL 6.3  6.0  5.8   Hemoglobin 13.0 - 17.0 g/dL 14.3  13.9  14.8   Hematocrit 39.0 - 52.0 % 41.7  40.5  43.5   Platelets 150 - 400 K/uL 126  141  189         Latest Ref Rng & Units 06/30/2022    9:09 AM 06/16/2022   10:18 AM 06/02/2022   10:42 AM  CMP  Glucose 70 - 99 mg/dL 100  109  99   BUN 8 - 23 mg/dL _0 Creatinine 0.61 - 1.24 mg/dL 0.83  0.90  0.85   Sodium 135 - 145 mmol/L 141  139  138   Potassium 3.5 - 5.1 mmol/L 3.2  3.8  4.0   Chloride 98 - 111 mmol/L 108  107  106   CO2 22 - 32 mmol/L _1 Calcium 8.9 - 10.3 mg/dL 8.8  9.1  9.4   Total Protein 6.5 - 8.1 g/dL 6.1  6.6  6.6   Total Bilirubin 0.3 - 1.2 mg/dL 0.6  0.6  0.5   Alkaline Phos 38 - 126 U/L 58  62  68   AST 15 - 41 U/L _2 ALT 0 - 44 U/L _3 RADIOGRAPHIC STUDIES: I  have personally reviewed the radiological images as listed and agreed with the findings in the report. No results found.    Orders Placed This Encounter  Procedures   CBC with Differential ([facility] Only)    Standing Status:   Future    Standing Expiration Date:   07/29/2023   CMP ([facility] only)    Standing Status:   Future    Standing Expiration Date:   07/29/2023   Total Protein, Urine dipstick     Standing Status:   Future    Standing Expiration Date:   07/29/2023   All questions were answered. The patient knows to call the clinic with any problems, questions or concerns. No barriers to learning was detected. The total time spent in the appointment was 30 minutes.     Truitt Merle, MD 06/30/2022   Felicity Coyer, CMA, am acting as scribe for Truitt Merle, MD.   I have reviewed the above documentation for accuracy and completeness, and I agree with the above.

## 2022-06-29 NOTE — Assessment & Plan Note (Signed)
-  stage II (pT3, N0), metastatic disease to lung and possible nodes in July 2023, MSS, KRAS/NRAS/BRAF wild type   -found on screening colonoscopy. S/p right hemicolectomy 01/09/21. -lung metastasis confirmed in RUL by bronchoscopy on 02/09/22. -FoundationOne showed MSS, low tumor burden, KRAS/NRAS/BRAF wild-type, no other targetable mutations. The benefit of EGFR inhibitor is much less in right-sided colon cancer, so I will not give it as first line but may consider in subsequent lines.  -he started FOLFOX on 03/03/22. He tolerated well overall with diarrhea for several days. Bevacizumab added with C2 (9/20).  -recent CT scan showed improved lung mets, but showed new infiltrative lung change, possible related to his recent URI, will repeat CT late jan

## 2022-06-30 ENCOUNTER — Encounter: Payer: Self-pay | Admitting: Hematology

## 2022-06-30 ENCOUNTER — Inpatient Hospital Stay: Payer: Medicare HMO | Attending: Physician Assistant

## 2022-06-30 ENCOUNTER — Inpatient Hospital Stay (HOSPITAL_BASED_OUTPATIENT_CLINIC_OR_DEPARTMENT_OTHER): Payer: Medicare HMO | Admitting: Hematology

## 2022-06-30 ENCOUNTER — Inpatient Hospital Stay: Payer: Medicare HMO

## 2022-06-30 VITALS — BP 149/94 | HR 69 | Temp 98.2°F | Resp 18 | Ht 71.0 in | Wt 236.8 lb

## 2022-06-30 DIAGNOSIS — I110 Hypertensive heart disease with heart failure: Secondary | ICD-10-CM | POA: Diagnosis not present

## 2022-06-30 DIAGNOSIS — G620 Drug-induced polyneuropathy: Secondary | ICD-10-CM | POA: Diagnosis not present

## 2022-06-30 DIAGNOSIS — Z452 Encounter for adjustment and management of vascular access device: Secondary | ICD-10-CM | POA: Insufficient documentation

## 2022-06-30 DIAGNOSIS — E860 Dehydration: Secondary | ICD-10-CM

## 2022-06-30 DIAGNOSIS — C7800 Secondary malignant neoplasm of unspecified lung: Secondary | ICD-10-CM

## 2022-06-30 DIAGNOSIS — Z5111 Encounter for antineoplastic chemotherapy: Secondary | ICD-10-CM | POA: Diagnosis not present

## 2022-06-30 DIAGNOSIS — Z79899 Other long term (current) drug therapy: Secondary | ICD-10-CM | POA: Diagnosis not present

## 2022-06-30 DIAGNOSIS — C182 Malignant neoplasm of ascending colon: Secondary | ICD-10-CM | POA: Diagnosis not present

## 2022-06-30 DIAGNOSIS — Z95828 Presence of other vascular implants and grafts: Secondary | ICD-10-CM

## 2022-06-30 DIAGNOSIS — I509 Heart failure, unspecified: Secondary | ICD-10-CM | POA: Insufficient documentation

## 2022-06-30 DIAGNOSIS — C189 Malignant neoplasm of colon, unspecified: Secondary | ICD-10-CM

## 2022-06-30 DIAGNOSIS — C184 Malignant neoplasm of transverse colon: Secondary | ICD-10-CM | POA: Insufficient documentation

## 2022-06-30 DIAGNOSIS — E041 Nontoxic single thyroid nodule: Secondary | ICD-10-CM | POA: Insufficient documentation

## 2022-06-30 DIAGNOSIS — R197 Diarrhea, unspecified: Secondary | ICD-10-CM | POA: Diagnosis not present

## 2022-06-30 DIAGNOSIS — Z5112 Encounter for antineoplastic immunotherapy: Secondary | ICD-10-CM | POA: Diagnosis not present

## 2022-06-30 LAB — CBC WITH DIFFERENTIAL (CANCER CENTER ONLY)
Abs Immature Granulocytes: 0.02 10*3/uL (ref 0.00–0.07)
Basophils Absolute: 0.1 10*3/uL (ref 0.0–0.1)
Basophils Relative: 1 %
Eosinophils Absolute: 0.1 10*3/uL (ref 0.0–0.5)
Eosinophils Relative: 2 %
HCT: 41.7 % (ref 39.0–52.0)
Hemoglobin: 14.3 g/dL (ref 13.0–17.0)
Immature Granulocytes: 0 %
Lymphocytes Relative: 26 %
Lymphs Abs: 1.7 10*3/uL (ref 0.7–4.0)
MCH: 33.1 pg (ref 26.0–34.0)
MCHC: 34.3 g/dL (ref 30.0–36.0)
MCV: 96.5 fL (ref 80.0–100.0)
Monocytes Absolute: 0.8 10*3/uL (ref 0.1–1.0)
Monocytes Relative: 13 %
Neutro Abs: 3.7 10*3/uL (ref 1.7–7.7)
Neutrophils Relative %: 58 %
Platelet Count: 126 10*3/uL — ABNORMAL LOW (ref 150–400)
RBC: 4.32 MIL/uL (ref 4.22–5.81)
RDW: 16.6 % — ABNORMAL HIGH (ref 11.5–15.5)
Smear Review: NORMAL
WBC Count: 6.3 10*3/uL (ref 4.0–10.5)
nRBC: 0 % (ref 0.0–0.2)

## 2022-06-30 LAB — TOTAL PROTEIN, URINE DIPSTICK: Protein, ur: <30 mg/dL — AB

## 2022-06-30 LAB — CMP (CANCER CENTER ONLY)
ALT: 14 U/L (ref 0–44)
AST: 18 U/L (ref 15–41)
Albumin: 3.4 g/dL — ABNORMAL LOW (ref 3.5–5.0)
Alkaline Phosphatase: 58 U/L (ref 38–126)
Anion gap: 6 (ref 5–15)
BUN: 9 mg/dL (ref 8–23)
CO2: 27 mmol/L (ref 22–32)
Calcium: 8.8 mg/dL — ABNORMAL LOW (ref 8.9–10.3)
Chloride: 108 mmol/L (ref 98–111)
Creatinine: 0.83 mg/dL (ref 0.61–1.24)
GFR, Estimated: >60 mL/min (ref >60)
Glucose, Bld: 100 mg/dL — ABNORMAL HIGH (ref 70–99)
Potassium: 3.2 mmol/L — ABNORMAL LOW (ref 3.5–5.1)
Sodium: 141 mmol/L (ref 135–145)
Total Bilirubin: 0.6 mg/dL (ref 0.3–1.2)
Total Protein: 6.1 g/dL — ABNORMAL LOW (ref 6.5–8.1)

## 2022-06-30 MED ORDER — SODIUM CHLORIDE 0.9 % IV SOLN
5.0000 mg/kg | Freq: Once | INTRAVENOUS | Status: AC
  Administered 2022-06-30: 500 mg via INTRAVENOUS
  Filled 2022-06-30: qty 16

## 2022-06-30 MED ORDER — HEPARIN SOD (PORK) LOCK FLUSH 100 UNIT/ML IV SOLN: 500.0000 [IU] | Freq: Once | INTRAVENOUS | Status: DC | PRN

## 2022-06-30 MED ORDER — SODIUM CHLORIDE 0.9 % IV SOLN: Freq: Once | INTRAVENOUS | Status: AC

## 2022-06-30 MED ORDER — SODIUM CHLORIDE 0.9% FLUSH
10.0000 mL | INTRAVENOUS | Status: AC | PRN
  Administered 2022-06-30: 10 mL

## 2022-06-30 MED ORDER — PALONOSETRON HCL INJECTION 0.25 MG/5ML
0.2500 mg | Freq: Once | INTRAVENOUS | Status: AC
  Administered 2022-06-30: 0.25 mg via INTRAVENOUS
  Filled 2022-06-30: qty 5

## 2022-06-30 MED ORDER — SODIUM CHLORIDE 0.9 % IV SOLN
10.0000 mg | Freq: Once | INTRAVENOUS | Status: AC
  Administered 2022-06-30: 10 mg via INTRAVENOUS
  Filled 2022-06-30: qty 10

## 2022-06-30 MED ORDER — SODIUM CHLORIDE 0.9% FLUSH: 10.0000 mL | INTRAVENOUS | Status: DC | PRN

## 2022-06-30 MED ORDER — LEUCOVORIN CALCIUM INJECTION 350 MG
400.0000 mg/m2 | Freq: Once | INTRAVENOUS | Status: AC
  Administered 2022-06-30: 932 mg via INTRAVENOUS
  Filled 2022-06-30: qty 46.6

## 2022-06-30 MED ORDER — OXALIPLATIN CHEMO INJECTION 100 MG/20ML
70.0000 mg/m2 | Freq: Once | INTRAVENOUS | Status: AC
  Administered 2022-06-30: 165 mg via INTRAVENOUS
  Filled 2022-06-30: qty 33

## 2022-06-30 MED ORDER — SODIUM CHLORIDE 0.9 % IV SOLN
2400.0000 mg/m2 | INTRAVENOUS | Status: DC
  Administered 2022-06-30: 5600 mg via INTRAVENOUS
  Filled 2022-06-30: qty 112

## 2022-06-30 MED ORDER — ALTEPLASE 2 MG IJ SOLR
2.0000 mg | Freq: Once | INTRAMUSCULAR | Status: AC | PRN
  Administered 2022-06-30: 2 mg
  Filled 2022-06-30: qty 2

## 2022-06-30 MED ORDER — DEXTROSE 5 % IV SOLN: Freq: Once | INTRAVENOUS | Status: AC

## 2022-06-30 NOTE — Patient Instructions (Signed)
[facility] ONCOLOG[facility]  Discharge Instructions: Thank you for choosing [facility] to provide your oncology and hematology care.   If you have a lab appointment with the [facility], please go directly to the [facility] and check in at the registration area.   Wear comfortable clothing and clothing appropriate for easy access to any Portacath or PICC line.   We strive to give you quality time with your provider. You may need to reschedule your appointment if you arrive late (15 or more minutes).  Arriving late affects you and other patients whose appointments are after yours.  Also, if you miss three or more appointments without notifying the office, you may be dismissed from the clinic at the provider's discretion.      For prescription refill requests, have your pharmacy contact our office and allow 72 hours for refills to be completed.    Today you received the following chemotherapy and/or immunotherapy agents: Bevacizumab, Oxaliplatin, Leucovorin, Fluorouracil.       To help prevent nausea and vomiting after your treatment, we encourage you to take your nausea medication as directed.  BELOW ARE SYMPTOMS THAT SHOULD BE REPORTED IMMEDIATELY: *FEVER GREATER THAN 100.4 F (38 C) OR HIGHER *CHILLS OR SWEATING *NAUSEA AND VOMITING THAT IS NOT CONTROLLED WITH YOUR NAUSEA MEDICATION *UNUSUAL SHORTNESS OF BREATH *UNUSUAL BRUISING OR BLEEDING *URINARY PROBLEMS (pain or burning when urinating, or frequent urination) *BOWEL PROBLEMS (unusual diarrhea, constipation, pain near the anus) TENDERNESS IN MOUTH AND THROAT WITH OR WITHOUT PRESENCE OF ULCERS (sore throat, sores in mouth, or a toothache) UNUSUAL RASH, SWELLING OR PAIN  UNUSUAL VAGINAL DISCHARGE OR ITCHING   Items with * indicate a potential emergency and should be followed up as soon as possible or go to the Emergency Department if any problems should occur.  Please show the CHEMOTHERAPY ALERT CARD  or IMMUNOTHERAPY ALERT CARD at check-in to the Emergency Department and triage nurse.  Should you have questions after your visit or need to cancel or reschedule your appointment, please contact [facility]  Dept: [phone]  and follow the prompts.  Office hours are 8:00 a.m. to 4:30 p.m. Monday - Friday. Please note that voicemails left after 4:00 p.m. may not be returned until the following business day.  We are closed weekends and major holidays. You have access to a nurse at all times for urgent questions. Please call the main number to the clinic Dept: [phone] and follow the prompts.   For any non-urgent questions, you may also contact your provider using MyChart. We now offer e-Visits for anyone 19 and older to request care online for non-urgent symptoms. For details visit [email or web address].   Also download the MyChart app! Go to the app store, search "MyChart", open the app, select [facility], and log in with your MyChart username and password.

## 2022-07-01 ENCOUNTER — Telehealth: Payer: Self-pay | Admitting: Hematology

## 2022-07-01 NOTE — Telephone Encounter (Signed)
Spoke with patient confirming upcoming appointments  

## 2022-07-02 ENCOUNTER — Inpatient Hospital Stay: Payer: Medicare HMO

## 2022-07-02 VITALS — BP 137/66 | HR 53 | Temp 98.1°F | Resp 20

## 2022-07-02 DIAGNOSIS — E041 Nontoxic single thyroid nodule: Secondary | ICD-10-CM | POA: Diagnosis not present

## 2022-07-02 DIAGNOSIS — G620 Drug-induced polyneuropathy: Secondary | ICD-10-CM | POA: Diagnosis not present

## 2022-07-02 DIAGNOSIS — C184 Malignant neoplasm of transverse colon: Secondary | ICD-10-CM | POA: Diagnosis not present

## 2022-07-02 DIAGNOSIS — C189 Malignant neoplasm of colon, unspecified: Secondary | ICD-10-CM

## 2022-07-02 DIAGNOSIS — C7800 Secondary malignant neoplasm of unspecified lung: Secondary | ICD-10-CM | POA: Diagnosis not present

## 2022-07-02 DIAGNOSIS — I509 Heart failure, unspecified: Secondary | ICD-10-CM | POA: Diagnosis not present

## 2022-07-02 DIAGNOSIS — R197 Diarrhea, unspecified: Secondary | ICD-10-CM | POA: Diagnosis not present

## 2022-07-02 DIAGNOSIS — Z5111 Encounter for antineoplastic chemotherapy: Secondary | ICD-10-CM | POA: Diagnosis not present

## 2022-07-02 DIAGNOSIS — Z79899 Other long term (current) drug therapy: Secondary | ICD-10-CM | POA: Diagnosis not present

## 2022-07-02 DIAGNOSIS — Z452 Encounter for adjustment and management of vascular access device: Secondary | ICD-10-CM | POA: Diagnosis not present

## 2022-07-02 DIAGNOSIS — Z5112 Encounter for antineoplastic immunotherapy: Secondary | ICD-10-CM | POA: Diagnosis not present

## 2022-07-02 DIAGNOSIS — I110 Hypertensive heart disease with heart failure: Secondary | ICD-10-CM | POA: Diagnosis not present

## 2022-07-02 MED ORDER — SODIUM CHLORIDE 0.9% FLUSH
10.0000 mL | INTRAVENOUS | Status: DC | PRN
  Administered 2022-07-02: 10 mL

## 2022-07-02 MED ORDER — HEPARIN SOD (PORK) LOCK FLUSH 100 UNIT/ML IV SOLN
500.0000 [IU] | Freq: Once | INTRAVENOUS | Status: AC | PRN
  Administered 2022-07-02: 500 [IU]

## 2022-07-03 DIAGNOSIS — C189 Malignant neoplasm of colon, unspecified: Secondary | ICD-10-CM | POA: Diagnosis not present

## 2022-07-03 DIAGNOSIS — C7800 Secondary malignant neoplasm of unspecified lung: Secondary | ICD-10-CM | POA: Diagnosis not present

## 2022-07-12 NOTE — Progress Notes (Signed)
[facility] OFFICE PROGRESS NOTE  Noni Saupe, MD [address] [place] [address]  DIAGNOSIS: f/u of   metastatic colon cancer   Oncology History  Cancer of right colon [facility])  10/2020 Procedure   Colonoscopy-Dr. Elnoria Howard     11/06/2020 Imaging   CT CHEST ABDOMEN PELVIS W CONTRAST   IMPRESSION: 1. 4.6 cm partially circumferential apple-core type neoplastic process involving the mid transverse colon. No findings for locoregional adenopathy or distant metastatic disease. 2. Enlarged prostate gland with median lobe hypertrophy impressing on the base of the bladder. 3. 16 mm left thyroid nodule. Recommend follow-up thyroid ultrasound examination.   01/09/2021 Initial Diagnosis   Colon cancer (HCC)   01/09/2021 Pathology Results   B. COLON, RIGHT, RESECTION:  -  Adenocarcinoma, moderately differentiated, 3.0 cm  -  No carcinoma identified in seventeen lymph nodes (0/17)  -  Margins uninvolved by carcinoma  -  Tubular adenoma (x2)  -  Benign appendix  -  See oncology table and comment below   Margin Status for Invasive Carcinoma: All margins negative for  invasive carcinoma       Distance from Invasive Carcinoma to Radial (Circumferential)       Margin Status for Non-Invasive Tumor: All margins negative for  high-grade dysplasia / intramucosal carcinoma and low-grade dysplasia  Regional Lymph Nodes:       Number of Lymph Nodes with Tumor: 0       Number of Lymph Nodes Examined: 17  Tumor Deposits: 0  Distant Metastasis:       Distant Site(s) Involved: Not applicable  Pathologic Stage Classification (pTNM, AJCC 8th Edition): pT3, pN0   MMR Stable    01/09/2021 Cancer Staging   Staging form: Colon and Rectum, AJCC 8th Edition - Pathologic stage from 01/09/2021: Stage IIA (pT3, pN0, cM0) - Signed by Malachy Mood, MD on 02/12/2022 Total positive nodes: 0 Histologic grading system: 4 grade system Histologic grade (G): G2 Residual tumor (R): R0 - None    05/2021 Tumor Marker   Patient's tumor was tested for the following markers: CEA. Results of the tumor marker test revealed 2.1.   12/11/2021 Procedure   Colonoscopy-Dr. Elnoria Howard  There was evidence of a prior functional end-to-end ileo-colonic anastomosis in the transverse colon. This was patent and was characterized by healthy appearing mucosa. The anastomosis was traversed. Findings: Scattered small and large-mouthed diverticula were found in the sigmoid colon. - Patent functional end-to-end ileo-colonic anastomosis, characterized by healthy appearing mucosa. - Diverticulosis in the sigmoid colon. - No specimens collected.   01/25/2022 Imaging   CT CHEST ABDOMEN PELVIS W CONTRAST   IMPRESSION: 1. New right-sided pulmonary nodules measure up to 11 mm, nonspecific but concerning for metastatic disease. Consider further evaluation with nuclear medicine PET/CT. 2. New nodularity/lymph nodes in the central mesentery, concerning for nodal disease involvement. 3. Slightly increased size of prominent retroperitoneal lymph nodes, nonspecific but also somewhat concerning for nodal disease involvement. 4. Surgical change of partial colectomy without evidence of local recurrence. 5. Stable prominent mediastinal lymph nodes, nonspecific but stability is reassuring. Attention on follow-up imaging suggested. 6. Stable prominent right external iliac lymph node, nonspecific but stability is reassuring. Attention on follow-up imaging suggested. 7. Stable hypodense 8 mm hepatic lesion technically too small to accurately characterize but stability is reassuring. Attention on follow-up imaging suggested. 8. 1.9 cm incidental left thyroid nodule. Recommend thyroid US. Reference: J Am Coll Radiol. 2015 Feb;12(2): 143-50 9.  Aortic Atherosclerosis (ICD10-I70.0).  02/09/2022 Procedure   Bronchoscopy under the care of Dr. Tonia Brooms    02/09/2022 Pathology Results   FINAL MICROSCOPIC DIAGNOSIS:   A.  LUNG, RUL, FINE NEEDLE ASPIRATION  BIOPSY FORCEPS:  Adenocarcinoma with morphologic features consistent with metastasis from a colorectal primary.  Please see comment.   Comment: The following immunostains are performed with appropriate controls :  TTF-1: Negative .  Napsin A: Negative .  CK7: Negative.  CK20: Positive.  CDX2: Positive .   The above immuno histochemical profile is supportive of the rendered diagnosis.    Colon cancer metastasized to lung [facility])  02/09/2022 Pathology Results   FINAL MICROSCOPIC DIAGNOSIS:   A. LUNG, RUL, FINE NEEDLE ASPIRATION  BIOPSY FORCEPS:  Adenocarcinoma with morphologic features consistent with metastasis from a colorectal primary.  Please see comment.   Comment: The following immunostains are performed with appropriate controls :  TTF-1: Negative .  Napsin A: Negative .  CK7: Negative.  CK20: Positive.  CDX2: Positive .   The above immuno histochemical profile is supportive of the rendered diagnosis.    02/12/2022 Initial Diagnosis   Colon cancer metastasized to lung (HCC)   03/03/2022 -  Chemotherapy   Patient is on Treatment Plan : COLORECTAL FOLFOX + Bevacizumab q14d       CURRENT THERAPY: FOLFOX, q14d, starting 03/03/22 -Bevacizumab added with C2 (9/20)   INTERVAL HISTORY: Tyvion Mccasland 77 y.o. male returns to the clinic today for a follow-up visit accompanied by his wife.  The patient was last seen in the clinic by Dr. Mosetta Putt 2 weeks ago.  The patient is currently undergoing chemotherapy with FOLFOX and Avastin.  He is tolerating this fairly well except he is on dose reduced oxaliplatin due to peripheral neuropathy.    The patient is overall feeling well today.  The patient and his wife had several questions today about his care and plans moving forward.  Dr. Mosetta Putt is arranging for restaging CT scan to be performed on 07/26/2022 to follow-up on his pulmonary nodules.  She is scheduled to see him back on 07/28/2022 to review the results and  to discuss next steps in his care at that time. They are very curious about what the scan will show and what his next steps will be.  The patient overall tolerates treatment well although he does experience intermittent diarrhea in the interval following treatments that is not always predictable.  This is not new for the patient and he states this is not any worse than normal.  He may have a few episodes of diarrhea (2-3) may have up to 5 loose stools at its worst.  He sometimes will take Imodium which does help.  He does not always take Imodium to the max amount if it is needed.  He is wondering if the diarrhea is strictly from his treatment or if certain food items such as teas, coffees, hot chocolate, etc. may be exacerbating diarrhea.  Of note, the patient does have a history of colon resection.  The patient has had diarrhea for the last 5 days, the most recent being yesterday in which she had 2-3 episodes.  He has not had any loose stools today but he took Imodium prior to leaving the house today.  Denies any associated abdominal pain, nausea or vomiting, or blood in the stool.  The patient does have an associated dry mouth/thirst.  The patient also talks about fatigue today.  The patient used to be active with walks but has not been as  active recently due to the cold weather.  He is thinking about getting back into exercising.   The patient denies any fever, chills, or night sweats.  His weight is roughly stable.  He denies any odynophagia or dysphagia.  The patient has not noticed any appreciable shortness of breath since he has not been as active.  The patient mentions that he had been treated with Levaquin for cough 6 weeks ago and he is very curious about his upcoming scan at this will be improved.  Denies any chest pain.  Denies any jaundice or itching.  Denies any abnormal bleeding or bruising.  The patient has hypertension in his cardiologist recently discontinued his Norvasc and increase his  Entresto.  The patient is here today for evaluation repeat blood work before undergoing cycle #9.     MEDICAL HISTORY: Past Medical History:  Diagnosis Date   Bilateral leg edema 08/24/2019   Cancer (HCC)    skin ca, colon   Chronic cough    Chronic pain of left knee 12/01/2015   COVID-19    06/2019   Dysrhythmia    afib  was cardioverted   Essential hypertension 08/24/2019   Mixed hyperlipidemia 08/24/2019   Obesity (BMI 30-39.9) 08/24/2019   Persistent atrial fibrillation (HCC) 08/24/2019    ALLERGIES:  is allergic to furosemide and tramadol.  MEDICATIONS:  Current Outpatient Medications  Medication Sig Dispense Refill   diphenoxylate-atropine (LOMOTIL) 2.5-0.025 MG tablet Take 1 tablet by mouth 4 (four) times daily as needed for diarrhea or loose stools. 30 tablet 0   potassium chloride SA (KLOR-CON M) 20 MEQ tablet Take 1 tablet (20 mEq total) by mouth daily. 7 tablet 0   acetaminophen (TYLENOL) 500 MG tablet Take 500-1,000 mg by mouth every 6 (six) hours as needed (pain.).     amLODipine (NORVASC) 10 MG tablet Take 1 tablet (10 mg total) by mouth daily. 30 tablet 1   Ascorbic Acid (VITAMIN C WITH ROSE HIPS) 500 MG tablet Take 500 mg by mouth every evening.     Bacillus Coagulans-Inulin (ALIGN PREBIOTIC-PROBIOTIC PO) Take by mouth.     Cholecalciferol (D3-1000) 25 MCG (1000 UT) capsule Take 1,000 Units by mouth every evening.     ELIQUIS 5 MG TABS tablet Take 1 tablet (5 mg total) by mouth 2 (two) times daily. 180 tablet 1   levofloxacin (LEVAQUIN) 750 MG tablet Take 750 mg by mouth daily.     lidocaine-prilocaine (EMLA) cream Apply 1 Application topically as needed. 30 g 2   lidocaine-prilocaine (EMLA) cream Apply to affected area once 30 g 3   metoprolol tartrate (LOPRESSOR) 25 MG tablet Take 1 tablet (25 mg total) by mouth 2 (two) times daily. 180 tablet 3   Misc Natural Products (JOINT SUPPORT PO) Take 1 tablet by mouth daily. Instaflex Advanced Joint Support      nitroGLYCERIN (NITROSTAT) 0.4 MG SL tablet Place 1 tablet (0.4 mg total) under the tongue every 5 (five) minutes as needed. 25 tablet 7   ondansetron (ZOFRAN) 8 MG tablet Take 1 tablet every 8 hours as needed for nausea/vomiting starting on 3 days after chemotherapy 30 tablet 2   ondansetron (ZOFRAN) 8 MG tablet Take 1 tablet (8 mg total) by mouth every 8 (eight) hours as needed for nausea or vomiting. Start on the third day after chemotherapy. 30 tablet 1   prochlorperazine (COMPAZINE) 10 MG tablet Take 1 tablet (10 mg total) by mouth every 6 (six) hours as needed. 30 tablet 2  prochlorperazine (COMPAZINE) 10 MG tablet Take 1 tablet (10 mg total) by mouth every 6 (six) hours as needed for nausea or vomiting. 30 tablet 1   sacubitril-valsartan (ENTRESTO) 24-26 MG Take 1 tablet by mouth 2 (two) times daily. 180 tablet 3   spironolactone (ALDACTONE) 50 MG tablet Take 1 tablet (50 mg total) by mouth daily. 10 tablet 0   vitamin B-12 (CYANOCOBALAMIN) 1000 MCG tablet Take 1,000 mcg by mouth every evening.     Zinc 50 MG CAPS Take 50 mg by mouth every evening.     No current facility-administered medications for this visit.   Facility-Administered Medications Ordered in Other Visits  Medication Dose Route Frequency Provider Last Rate Last Admin   bevacizumab-bvzr (ZIRABEV) 500 mg in sodium chloride 0.9 % 100 mL chemo infusion  5 mg/kg (Treatment Plan Recorded) Intravenous Once Malachy Mood, MD       dexamethasone (DECADRON) 10 mg in sodium chloride 0.9 % 50 mL IVPB  10 mg Intravenous Once Malachy Mood, MD 204 mL/hr at 07/15/22 1036 10 mg at 07/15/22 1036   dextrose 5 % solution   Intravenous Once Malachy Mood, MD       fluorouracil (ADRUCIL) 5,600 mg in sodium chloride 0.9 % 138 mL chemo infusion  2,400 mg/m2 (Treatment Plan Recorded) Intravenous 1 day or 1 dose Malachy Mood, MD       heparin lock flush 100 unit/mL  500 Units Intracatheter Once PRN Malachy Mood, MD       leucovorin 932 mg in dextrose 5 % 250 mL  infusion  400 mg/m2 (Treatment Plan Recorded) Intravenous Once Malachy Mood, MD       oxaliplatin (ELOXATIN) 165 mg in dextrose 5 % 500 mL chemo infusion  70 mg/m2 (Treatment Plan Recorded) Intravenous Once Malachy Mood, MD       sodium chloride flush (NS) 0.9 % injection 10 mL  10 mL Intracatheter PRN Malachy Mood, MD        SURGICAL HISTORY:  Past Surgical History:  Procedure Laterality Date   ACHILLES TENDON REPAIR Left 2012   Ruptured   BRONCHIAL BIOPSY  02/09/2022   Procedure: BRONCHIAL BIOPSIES;  Surgeon: Josephine Igo, DO;  Location: [facility];  Service: Pulmonary;;   BRONCHIAL NEEDLE ASPIRATION BIOPSY  02/09/2022   Procedure: BRONCHIAL NEEDLE ASPIRATION BIOPSIES;  Surgeon: Josephine Igo, DO;  Location: [facility];  Service: Pulmonary;;   COLONOSCOPY WITH PROPOFOL N/A 12/11/2021   Procedure: COLONOSCOPY WITH PROPOFOL;  Surgeon: Jeani Hawking, MD;  Location: [facility];  Service: Gastroenterology;  Laterality: N/A;   HERNIA REPAIR     umbilical hernia   IR IMAGING GUIDED PORT INSERTION  02/18/2022   SKIN SURGERY Left    Basal cell carcinoma on left forearm   XI ROBOTIC ASSISTED LOWER ANTERIOR RESECTION N/A 01/09/2021   Procedure: XI ROBOTIC ASSISTED RIGHT HEMI COLECTOMY, LYSIS OF ADHESIONS, SMALL BOWEL RESECTION, INTRAOPERATIVE ASSESMENT OF PERFUSION, PARTIAL OMENTECTOMY;  Surgeon: Andria Meuse, MD;  Location: [facility];  Service: General;  Laterality: N/A;    REVIEW OF SYSTEMS:   Review of Systems  Constitutional: Positive for stable fatigue.  Positive for some decreased appetite following treatments.  Negative for chills, fever and unexpected weight change.  HENT: Negative for mouth sores, nosebleeds, sore throat and trouble swallowing.   Eyes: Negative for eye problems and icterus.  Respiratory: Negative for cough, hemoptysis, shortness of breath and wheezing.   Cardiovascular: Negative for chest pain and leg swelling.  Gastrointestinal: Positive for intermittent  diarrhea.  Negative for abdominal pain, constipation, nausea and vomiting.  Genitourinary: Negative for bladder incontinence, difficulty urinating, dysuria, frequency and hematuria.   Musculoskeletal: Negative for back pain, gait problem, neck pain and neck stiffness.  Skin: Negative for itching and rash.  Neurological: Negative for dizziness, extremity weakness, gait problem, headaches, light-headedness and seizures.  Hematological: Negative for adenopathy. Does not bruise/bleed easily.  Psychiatric/Behavioral: Negative for confusion, depression and sleep disturbance. The patient is not nervous/anxious.     PHYSICAL EXAMINATION:  Blood pressure (!) 162/89, pulse 61, temperature 97.8 F (36.6 C), temperature source Oral, resp. rate 14, weight 235 lb 8 oz (106.8 kg), SpO2 99 %.  ECOG PERFORMANCE STATUS: 1  Physical Exam  Constitutional: Oriented to person, place, and time and well-developed, well-nourished, and in no distress. Marland Kitchen  HENT:  Head: Normocephalic and atraumatic.  Mouth/Throat: Oropharynx is clear and moist. No oropharyngeal exudate.  Eyes: Conjunctivae are normal. Right eye exhibits no discharge. Left eye exhibits no discharge. No scleral icterus.  Neck: Normal range of motion. Neck supple.  Cardiovascular: Normal rate, regular rhythm, normal heart sounds and intact distal pulses.   Pulmonary/Chest: Effort normal and breath sounds normal. No respiratory distress. No wheezes. No rales.  Abdominal: Soft. Bowel sounds are normal. Exhibits no distension and no mass. There is no tenderness.  Musculoskeletal: Normal range of motion. Exhibits no edema.  Lymphadenopathy:    No cervical adenopathy.  Neurological: Alert and oriented to person, place, and time. Exhibits normal muscle tone. Gait normal. Coordination normal.  Skin: Skin is warm and dry. No rash noted. Not diaphoretic. No erythema. No pallor.  Psychiatric: Mood, memory and judgment normal.  Vitals reviewed.  LABORATORY  DATA: Lab Results  Component Value Date   WBC 4.9 07/15/2022   HGB 14.3 07/15/2022   HCT 41.3 07/15/2022   MCV 96.9 07/15/2022   PLT 129 (L) 07/15/2022      Chemistry      Component Value Date/Time   NA 142 07/15/2022 0810   NA 142 02/20/2020 0912   K 3.1 (L) 07/15/2022 0810   CL 109 07/15/2022 0810   CO2 28 07/15/2022 0810   BUN 7 (L) 07/15/2022 0810   BUN 9 02/20/2020 0912   CREATININE 0.73 07/15/2022 0810      Component Value Date/Time   CALCIUM 8.6 (L) 07/15/2022 0810   ALKPHOS 55 07/15/2022 0810   AST 16 07/15/2022 0810   ALT 13 07/15/2022 0810   BILITOT 0.6 07/15/2022 0810       RADIOGRAPHIC STUDIES:  No results found.   ASSESSMENT/PLAN:  Latoya Diskin is a 77 y.o. male with   Cancer of right colon (HCC) -Initially diagnosed as stage II (pT3, N0), metastatic disease to lung and possible nodes in July 2023, MSS, KRAS/NRAS/BRAF wild type   -found on screening colonoscopy. S/p right hemicolectomy 01/09/21. -lung metastasis confirmed in [facility] by bronchoscopy on 02/09/22. -[facility] showed MSS, low tumor burden, KRAS/NRAS/BRAF wild-type, no other targetable mutations. The benefit of EGFR inhibitor is much less in right-sided colon cancer, so Dr. Mosetta Putt will not give it as first line but may consider in subsequent lines.  -he started FOLFOX on 03/03/22. He tolerated well overall with diarrhea for several days. Bevacizumab added with C2 (9/20).  -recent CT scan showed improved lung mets, but showed new infiltrative lung change, possible related to his recent URI, Dr. Mosetta Putt is planning to repeat CT late jan 07/26/22.  -Labs were reviewed.  I answered all the patient's questions to the  best of my ability and to the patient's satisfaction today.  I discussed with the patient that Dr. Mosetta Putt will base the next steps in his care off of his restaging CT scan and we will discuss at his next appointment.  -We reviewed how to manage side effects of treatment.  Overall symptoms manageable  and he does not have any new symptoms today.  He sometimes has intermittent diarrhea following treatment.   -Recommend he proceed with cycle #9 today a scheduled.  -F/u in 2 weeks with cycle #10 and to review scan results.   2. Intermittent Diarrhea/hypokalemia -The patient currently gets diarrhea in the interval between his treatments. -Today, the patient reports persistent intermittent diarrhea that is not any worse than normal. -We reviewed the Imodium package instructions.  Discussed the importance of taking advised persistent diarrhea may cause dehydration and electrolyte derangements.  Creatinine within normal limits on labs but his potassium is slightly low at 3.1. -Sent short prescription for potassium 20 mill equivalents to the pharmacy to take for the next 7 days. -Also encouraged to increase his hydration with water and electrolyte drinks if he experiences diarrhea. -I sent him a prescription for Lomotil to take only if needed for additional control of his diarrhea.  It sounds like Imodium helps his diarrhea if he takes it as prescribed. -He does not have any abdominal pain or blood in the stool today. -The patient is wondering if any certain foods he is eating is exacerbating his diarrhea.  Discussed it could be from his treatment and history of colon resection but he may certainly keep a food diary to see if there are any foods that exacerbate his symptoms  3.  Hypertension/CHF -Patient follows with cardiology. -States his Norvasc was recently discontinued -Blood pressure slightly elevated 162/89.  -Discussed the importance of good blood pressure control while on Avastin.  -Okay to treat with current blood pressure today but we will recheck this in the infusion room. -Patient is wondering if he needs to restart Norvasc.  Recommended for him to monitor his blood pressure at home and keep a log of his readings.  Should his blood pressure be out of range on multiple readings then  encouraged him to follow-up with his cardiologist about dose adjustments.    PLAN: -lab reviewed -proceed with C9 FOLFOX + Bevacizumab at same reduce dose of oxaliplatin due to his neuropathy   -lab f/u o 07/28/22 -CT scan on 07/26/22 -Reviewed Imodium instructions and sent Lomotil to pharmacy if needed -Encourage hydration -Potassium supplement sent to pharmacy.  Will avoid long-term potassium supplements given he is currently on spironolactone.   No orders of the defined types were placed in this encounter.    The total time spent in the appointment was 30-39 minutes.   Camilah Spillman L Kenyonna Micek, PA-C 07/15/22

## 2022-07-15 ENCOUNTER — Inpatient Hospital Stay (HOSPITAL_BASED_OUTPATIENT_CLINIC_OR_DEPARTMENT_OTHER): Payer: Medicare HMO | Admitting: Physician Assistant

## 2022-07-15 ENCOUNTER — Inpatient Hospital Stay: Payer: Medicare HMO

## 2022-07-15 VITALS — BP 162/89 | HR 61 | Temp 97.8°F | Resp 14 | Wt 235.5 lb

## 2022-07-15 DIAGNOSIS — E876 Hypokalemia: Secondary | ICD-10-CM | POA: Diagnosis not present

## 2022-07-15 DIAGNOSIS — Z5111 Encounter for antineoplastic chemotherapy: Secondary | ICD-10-CM | POA: Diagnosis not present

## 2022-07-15 DIAGNOSIS — R197 Diarrhea, unspecified: Secondary | ICD-10-CM | POA: Diagnosis not present

## 2022-07-15 DIAGNOSIS — G620 Drug-induced polyneuropathy: Secondary | ICD-10-CM | POA: Diagnosis not present

## 2022-07-15 DIAGNOSIS — Z79899 Other long term (current) drug therapy: Secondary | ICD-10-CM | POA: Diagnosis not present

## 2022-07-15 DIAGNOSIS — Z5112 Encounter for antineoplastic immunotherapy: Secondary | ICD-10-CM | POA: Diagnosis not present

## 2022-07-15 DIAGNOSIS — C189 Malignant neoplasm of colon, unspecified: Secondary | ICD-10-CM

## 2022-07-15 DIAGNOSIS — Z95828 Presence of other vascular implants and grafts: Secondary | ICD-10-CM

## 2022-07-15 DIAGNOSIS — C7800 Secondary malignant neoplasm of unspecified lung: Secondary | ICD-10-CM

## 2022-07-15 DIAGNOSIS — I509 Heart failure, unspecified: Secondary | ICD-10-CM | POA: Diagnosis not present

## 2022-07-15 DIAGNOSIS — E041 Nontoxic single thyroid nodule: Secondary | ICD-10-CM | POA: Diagnosis not present

## 2022-07-15 DIAGNOSIS — C184 Malignant neoplasm of transverse colon: Secondary | ICD-10-CM | POA: Diagnosis not present

## 2022-07-15 DIAGNOSIS — Z452 Encounter for adjustment and management of vascular access device: Secondary | ICD-10-CM | POA: Diagnosis not present

## 2022-07-15 DIAGNOSIS — I110 Hypertensive heart disease with heart failure: Secondary | ICD-10-CM | POA: Diagnosis not present

## 2022-07-15 LAB — CBC WITH DIFFERENTIAL (CANCER CENTER ONLY)
Abs Immature Granulocytes: 0.01 10*3/uL (ref 0.00–0.07)
Basophils Absolute: 0.1 10*3/uL (ref 0.0–0.1)
Basophils Relative: 1 %
Eosinophils Absolute: 0.2 10*3/uL (ref 0.0–0.5)
Eosinophils Relative: 4 %
HCT: 41.3 % (ref 39.0–52.0)
Hemoglobin: 14.3 g/dL (ref 13.0–17.0)
Immature Granulocytes: 0 %
Lymphocytes Relative: 29 %
Lymphs Abs: 1.4 10*3/uL (ref 0.7–4.0)
MCH: 33.6 pg (ref 26.0–34.0)
MCHC: 34.6 g/dL (ref 30.0–36.0)
MCV: 96.9 fL (ref 80.0–100.0)
Monocytes Absolute: 0.8 10*3/uL (ref 0.1–1.0)
Monocytes Relative: 15 %
Neutro Abs: 2.5 10*3/uL (ref 1.7–7.7)
Neutrophils Relative %: 51 %
Platelet Count: 129 10*3/uL — ABNORMAL LOW (ref 150–400)
RBC: 4.26 MIL/uL (ref 4.22–5.81)
RDW: 16.6 % — ABNORMAL HIGH (ref 11.5–15.5)
WBC Count: 4.9 10*3/uL (ref 4.0–10.5)
nRBC: 0 % (ref 0.0–0.2)

## 2022-07-15 LAB — CMP (CANCER CENTER ONLY)
ALT: 13 U/L (ref 0–44)
AST: 16 U/L (ref 15–41)
Albumin: 3.2 g/dL — ABNORMAL LOW (ref 3.5–5.0)
Alkaline Phosphatase: 55 U/L (ref 38–126)
Anion gap: 5 (ref 5–15)
BUN: 7 mg/dL — ABNORMAL LOW (ref 8–23)
CO2: 28 mmol/L (ref 22–32)
Calcium: 8.6 mg/dL — ABNORMAL LOW (ref 8.9–10.3)
Chloride: 109 mmol/L (ref 98–111)
Creatinine: 0.73 mg/dL (ref 0.61–1.24)
GFR, Estimated: >60 mL/min (ref >60)
Glucose, Bld: 114 mg/dL — ABNORMAL HIGH (ref 70–99)
Potassium: 3.1 mmol/L — ABNORMAL LOW (ref 3.5–5.1)
Sodium: 142 mmol/L (ref 135–145)
Total Bilirubin: 0.6 mg/dL (ref 0.3–1.2)
Total Protein: 5.8 g/dL — ABNORMAL LOW (ref 6.5–8.1)

## 2022-07-15 LAB — TOTAL PROTEIN, URINE DIPSTICK: Protein, ur: <30 mg/dL — AB

## 2022-07-15 MED ORDER — SODIUM CHLORIDE 0.9% FLUSH: 10.0000 mL | INTRAVENOUS | Status: DC | PRN

## 2022-07-15 MED ORDER — HEPARIN SOD (PORK) LOCK FLUSH 100 UNIT/ML IV SOLN
500.0000 [IU] | Freq: Once | INTRAVENOUS | Status: AC | PRN
  Administered 2022-07-15: 500 [IU]

## 2022-07-15 MED ORDER — LEUCOVORIN CALCIUM INJECTION 350 MG
400.0000 mg/m2 | Freq: Once | INTRAVENOUS | Status: AC
  Administered 2022-07-15: 932 mg via INTRAVENOUS
  Filled 2022-07-15: qty 46.6

## 2022-07-15 MED ORDER — DIPHENOXYLATE-ATROPINE 2.5-0.025 MG PO TABS: 1.0000 | ORAL_TABLET | Freq: Four times a day (QID) | ORAL | 0 refills | Status: DC | PRN

## 2022-07-15 MED ORDER — PALONOSETRON HCL INJECTION 0.25 MG/5ML
0.2500 mg | Freq: Once | INTRAVENOUS | Status: AC
  Administered 2022-07-15: 0.25 mg via INTRAVENOUS
  Filled 2022-07-15: qty 5

## 2022-07-15 MED ORDER — SODIUM CHLORIDE 0.9 % IV SOLN
2400.0000 mg/m2 | INTRAVENOUS | Status: DC
  Administered 2022-07-15: 5600 mg via INTRAVENOUS
  Filled 2022-07-15: qty 112

## 2022-07-15 MED ORDER — DEXTROSE 5 % IV SOLN: Freq: Once | INTRAVENOUS | Status: AC

## 2022-07-15 MED ORDER — SODIUM CHLORIDE 0.9 % IV SOLN
5.0000 mg/kg | Freq: Once | INTRAVENOUS | Status: AC
  Administered 2022-07-15: 500 mg via INTRAVENOUS
  Filled 2022-07-15: qty 16

## 2022-07-15 MED ORDER — OXALIPLATIN CHEMO INJECTION 100 MG/20ML
70.0000 mg/m2 | Freq: Once | INTRAVENOUS | Status: AC
  Administered 2022-07-15: 165 mg via INTRAVENOUS
  Filled 2022-07-15: qty 33

## 2022-07-15 MED ORDER — SODIUM CHLORIDE 0.9 % IV SOLN
10.0000 mg | Freq: Once | INTRAVENOUS | Status: AC
  Administered 2022-07-15: 10 mg via INTRAVENOUS
  Filled 2022-07-15: qty 10

## 2022-07-15 MED ORDER — SODIUM CHLORIDE 0.9 % IV SOLN: Freq: Once | INTRAVENOUS | Status: AC

## 2022-07-15 MED ORDER — SODIUM CHLORIDE 0.9% FLUSH
10.0000 mL | INTRAVENOUS | Status: AC | PRN
  Administered 2022-07-15: 10 mL

## 2022-07-15 MED ORDER — POTASSIUM CHLORIDE CRYS ER 20 MEQ PO TBCR: 20.0000 meq | EXTENDED_RELEASE_TABLET | Freq: Every day | ORAL | 0 refills | Status: DC

## 2022-07-15 NOTE — Progress Notes (Signed)
Per Cassie Heilingoetter, PA, ok to treat with elevated BP

## 2022-07-15 NOTE — Patient Instructions (Signed)
[facility] MEDICAL ONCOLOGY  Discharge Instructions: Thank you for choosing [facility] to provide your oncology and hematology care.   If you have a lab appointment with the Cancer Center, please go directly to the Cancer Center and check in at the registration area.   Wear comfortable clothing and clothing appropriate for easy access to any Portacath or PICC line.   We strive to give you quality time with your provider. You may need to reschedule your appointment if you arrive late (15 or more minutes).  Arriving late affects you and other patients whose appointments are after yours.  Also, if you miss three or more appointments without notifying the office, you may be dismissed from the clinic at the provider's discretion.      For prescription refill requests, have your pharmacy contact our office and allow 72 hours for refills to be completed.    Today you received the following chemotherapy and/or immunotherapy agents oxaliplatin, fluorourcil, bevacizumab, leucovorin      To help prevent nausea and vomiting after your treatment, we encourage you to take your nausea medication as directed.  BELOW ARE SYMPTOMS THAT SHOULD BE REPORTED IMMEDIATELY: *FEVER GREATER THAN 100.4 F (38 C) OR HIGHER *CHILLS OR SWEATING *NAUSEA AND VOMITING THAT IS NOT CONTROLLED WITH YOUR NAUSEA MEDICATION *UNUSUAL SHORTNESS OF BREATH *UNUSUAL BRUISING OR BLEEDING *URINARY PROBLEMS (pain or burning when urinating, or frequent urination) *BOWEL PROBLEMS (unusual diarrhea, constipation, pain near the anus) TENDERNESS IN MOUTH AND THROAT WITH OR WITHOUT PRESENCE OF ULCERS (sore throat, sores in mouth, or a toothache) UNUSUAL RASH, SWELLING OR PAIN  UNUSUAL VAGINAL DISCHARGE OR ITCHING   Items with * indicate a potential emergency and should be followed up as soon as possible or go to the Emergency Department if any problems should occur.  Please show the CHEMOTHERAPY ALERT CARD or  IMMUNOTHERAPY ALERT CARD at check-in to the Emergency Department and triage nurse.  Should you have questions after your visit or need to cancel or reschedule your appointment, please contact [facility]  Dept: [phone]  and follow the prompts.  Office hours are 8:00 a.m. to 4:30 p.m. Monday - Friday. Please note that voicemails left after 4:00 p.m. may not be returned until the following business day.  We are closed weekends and major holidays. You have access to a nurse at all times for urgent questions. Please call the main number to the clinic Dept: [phone] and follow the prompts.   For any non-urgent questions, you may also contact your provider using MyChart. We now offer e-Visits for anyone 50 and older to request care online for non-urgent symptoms. For details visit [email or web address].   Also download the MyChart app! Go to the app store, search "MyChart", open the app, select [place], and log in with your MyChart username and password.

## 2022-07-17 ENCOUNTER — Inpatient Hospital Stay: Payer: Medicare HMO

## 2022-07-17 VITALS — BP 163/79 | HR 68 | Temp 97.3°F | Resp 16

## 2022-07-17 DIAGNOSIS — C189 Malignant neoplasm of colon, unspecified: Secondary | ICD-10-CM

## 2022-07-17 DIAGNOSIS — C184 Malignant neoplasm of transverse colon: Secondary | ICD-10-CM | POA: Diagnosis not present

## 2022-07-17 DIAGNOSIS — I110 Hypertensive heart disease with heart failure: Secondary | ICD-10-CM | POA: Diagnosis not present

## 2022-07-17 DIAGNOSIS — Z79899 Other long term (current) drug therapy: Secondary | ICD-10-CM | POA: Diagnosis not present

## 2022-07-17 DIAGNOSIS — C7800 Secondary malignant neoplasm of unspecified lung: Secondary | ICD-10-CM | POA: Diagnosis not present

## 2022-07-17 DIAGNOSIS — G620 Drug-induced polyneuropathy: Secondary | ICD-10-CM | POA: Diagnosis not present

## 2022-07-17 DIAGNOSIS — Z5112 Encounter for antineoplastic immunotherapy: Secondary | ICD-10-CM | POA: Diagnosis not present

## 2022-07-17 DIAGNOSIS — I509 Heart failure, unspecified: Secondary | ICD-10-CM | POA: Diagnosis not present

## 2022-07-17 DIAGNOSIS — E041 Nontoxic single thyroid nodule: Secondary | ICD-10-CM | POA: Diagnosis not present

## 2022-07-17 DIAGNOSIS — Z452 Encounter for adjustment and management of vascular access device: Secondary | ICD-10-CM | POA: Diagnosis not present

## 2022-07-17 DIAGNOSIS — R197 Diarrhea, unspecified: Secondary | ICD-10-CM | POA: Diagnosis not present

## 2022-07-17 DIAGNOSIS — Z5111 Encounter for antineoplastic chemotherapy: Secondary | ICD-10-CM | POA: Diagnosis not present

## 2022-07-17 MED ORDER — HEPARIN SOD (PORK) LOCK FLUSH 100 UNIT/ML IV SOLN
500.0000 [IU] | Freq: Once | INTRAVENOUS | Status: AC | PRN
  Administered 2022-07-17: 500 [IU]

## 2022-07-17 MED ORDER — SODIUM CHLORIDE 0.9% FLUSH
10.0000 mL | INTRAVENOUS | Status: DC | PRN
  Administered 2022-07-17: 10 mL

## 2022-07-26 ENCOUNTER — Ambulatory Visit (HOSPITAL_COMMUNITY)
Admission: RE | Admit: 2022-07-26 | Discharge: 2022-07-26 | Disposition: A | Discharge status: Home / Self Care | Payer: Medicare HMO | Source: Ambulatory Visit | Attending: Hematology | Admitting: Hematology

## 2022-07-26 DIAGNOSIS — J479 Bronchiectasis, uncomplicated: Secondary | ICD-10-CM | POA: Diagnosis not present

## 2022-07-26 DIAGNOSIS — C7800 Secondary malignant neoplasm of unspecified lung: Secondary | ICD-10-CM | POA: Diagnosis not present

## 2022-07-26 DIAGNOSIS — C189 Malignant neoplasm of colon, unspecified: Secondary | ICD-10-CM | POA: Diagnosis not present

## 2022-07-26 DIAGNOSIS — C182 Malignant neoplasm of ascending colon: Secondary | ICD-10-CM | POA: Diagnosis not present

## 2022-07-26 DIAGNOSIS — R918 Other nonspecific abnormal finding of lung field: Secondary | ICD-10-CM | POA: Diagnosis not present

## 2022-07-26 NOTE — Assessment & Plan Note (Signed)
-  stage II (pT3, N0), metastatic disease to lung and possible nodes in July 2023, MSS, KRAS/NRAS/BRAF wild type   -found on screening colonoscopy. S/p right hemicolectomy 01/09/21. -lung metastasis confirmed in RUL by bronchoscopy on 02/09/22. -FoundationOne showed MSS, low tumor burden, KRAS/NRAS/BRAF wild-type, no other targetable mutations. The benefit of EGFR inhibitor is much less in right-sided colon cancer, so I will not give it as first line but may consider in subsequent lines.  -he started FOLFOX on 03/03/22. He tolerated well overall with diarrhea for several days. Bevacizumab added with C2 (9/20).  -CT scan 05/26/2022 showed improved lung mets, but showed new infiltrative lung change, possible related to his recent URI -repeated CT chest on 07/26/2022 showed stable known lung mets (overall excellent response, with minimum residual dsease) and resolved previously see inflammatory change in RLL -plan to change to maintenance xeloda and beva  -will discuss with rad/onc if he needs consolidation SBRT

## 2022-07-27 ENCOUNTER — Telehealth: Payer: Self-pay | Admitting: Hematology

## 2022-07-27 MED FILL — Dexamethasone Sodium Phosphate Inj 100 MG/10ML: INTRAMUSCULAR | Qty: 1 | Status: AC

## 2022-07-27 NOTE — Progress Notes (Unsigned)
[facility]   Telephone:(336) [phone] Fax:(336) [phone]   Clinic Follow up Note   Patient Care Team: Angelina Sheriff, MD as PCP - General (Family Medicine) Berniece Salines, DO as PCP - Cardiology (Cardiology) Truitt Merle, MD as Attending Physician (Hematology and Oncology)  Date of Service:  07/28/2022  CHIEF COMPLAINT: f/u of metastatic colon cancer     CURRENT THERAPY: FOLFOX, q14d, starting 03/03/22 -Bevacizumab added with C2 (9/20)    ASSESSMENT:  Thomas Knight is a 77 y.o. male with   Cancer of right colon (Thomas Knight) -stage II (pT3, N0), metastatic disease to lung and possible nodes in July 2023, MSS, KRAS/NRAS/BRAF wild type   -found on screening colonoscopy. S/p right hemicolectomy 01/09/21. -lung metastasis confirmed in RUL by bronchoscopy on 02/09/22. -FoundationOne showed MSS, low tumor burden, KRAS/NRAS/BRAF wild-type, no other targetable mutations. The benefit of EGFR inhibitor is much less in right-sided colon cancer, so I will not give it as first line but may consider in subsequent lines.  -he started FOLFOX on 03/03/22. He tolerated well overall with diarrhea for several days. Bevacizumab added with C2 (9/20).  -CT scan 05/26/2022 showed improved lung mets, but showed new infiltrative lung change, possible related to his recent URI -repeated CT chest on 07/26/2022 showed stable known lung mets (overall excellent response, with minimum residual dsease) and resolved previously see inflammatory change in RLL -plan to change to maintenance xeloda and beva in 3 weeks, plan for 3 to 6 months of maintenance therapy, he will proceed to last cycle FOLFOX tomorrow.  Patient and his family had many questions about the CT scan findings, his cancer prognosis and treatment options. -Potential side effect of Xeloda, especially skin toxicities, were discussed with patient and his family in detail.  I called in today -will discuss with rad/onc if he needs consolidation SBRT  -f/u  in 3 weeks      PLAN: - Discuss Ct Chest scan- stable -Discuss switching to maintenance therapy oral chemo pill Xeloda for 6 months, and continue Beva. He will take Xeloda 500mg  4 pills in the AM and 4 pill in the evening. Discuss side effects. Start in 3 weeks. - Discuss with Dr. Lisbeth Renshaw about Radiation -discuss cont BEVA q3 wks and discontinue Oxaliplatin after last cycle FOLFOX tomorrow . -lab,flush and BEVA in 3 weeks   SUMMARY OF ONCOLOGIC HISTORY: Oncology History  Cancer of right colon ([place])  10/2020 Procedure   Colonoscopy-Dr. Benson Norway     11/06/2020 Imaging   CT CHEST ABDOMEN PELVIS W CONTRAST   IMPRESSION: 1. 4.6 cm partially circumferential apple-core type neoplastic process involving the mid transverse colon. No findings for locoregional adenopathy or distant metastatic disease. 2. Enlarged prostate gland with median lobe hypertrophy impressing on the base of the bladder. 3. 16 mm left thyroid nodule. Recommend follow-up thyroid ultrasound examination.   01/09/2021 Initial Diagnosis   Colon cancer ([place])   01/09/2021 Pathology Results   B. COLON, RIGHT, RESECTION:  -  Adenocarcinoma, moderately differentiated, 3.0 cm  -  No carcinoma identified in seventeen lymph nodes (0/17)  -  Margins uninvolved by carcinoma  -  Tubular adenoma (x2)  -  Benign appendix  -  See oncology table and comment below   Margin Status for Invasive Carcinoma: All margins negative for  invasive carcinoma       Distance from Invasive Carcinoma to Radial (Circumferential)       Margin Status for Non-Invasive Tumor: All margins negative for  high-grade dysplasia /  intramucosal carcinoma and low-grade dysplasia  Regional Lymph Nodes:       Number of Lymph Nodes with Tumor: 0       Number of Lymph Nodes Examined: 17  Tumor Deposits: 0  Distant Metastasis:       Distant Site(s) Involved: Not applicable  Pathologic Stage Classification (pTNM, AJCC 8th Edition): pT3, pN0   MMR Stable     01/09/2021 Cancer Staging   Staging form: Colon and Rectum, AJCC 8th Edition - Pathologic stage from 01/09/2021: Stage IIA (pT3, pN0, cM0) - Signed by Truitt Merle, MD on 02/12/2022 Total positive nodes: 0 Histologic grading system: 4 grade system Histologic grade (G): G2 Residual tumor (R): R0 - None   05/2021 Tumor Marker   Patient's tumor was tested for the following markers: CEA. Results of the tumor marker test revealed 2.1.   12/11/2021 Procedure   Colonoscopy-Dr. Benson Norway  There was evidence of a prior functional end-to-end ileo-colonic anastomosis in the transverse colon. This was patent and was characterized by healthy appearing mucosa. The anastomosis was traversed. Findings: Scattered small and large-mouthed diverticula were found in the sigmoid colon. - Patent functional end-to-end ileo-colonic anastomosis, characterized by healthy appearing mucosa. - Diverticulosis in the sigmoid colon. - No specimens collected.   01/25/2022 Imaging   CT CHEST ABDOMEN PELVIS W CONTRAST   IMPRESSION: 1. New right-sided pulmonary nodules measure up to 11 mm, nonspecific but concerning for metastatic disease. Consider further evaluation with nuclear medicine PET/CT. 2. New nodularity/lymph nodes in the central mesentery, concerning for nodal disease involvement. 3. Slightly increased size of prominent retroperitoneal lymph nodes, nonspecific but also somewhat concerning for nodal disease involvement. 4. Surgical change of partial colectomy without evidence of local recurrence. 5. Stable prominent mediastinal lymph nodes, nonspecific but stability is reassuring. Attention on follow-up imaging suggested. 6. Stable prominent right external iliac lymph node, nonspecific but stability is reassuring. Attention on follow-up imaging suggested. 7. Stable hypodense 8 mm hepatic lesion technically too small to accurately characterize but stability is reassuring. Attention on follow-up imaging  suggested. 8. 1.9 cm incidental left thyroid nodule. Recommend thyroid US. Reference: J Am Coll Radiol. 2015 Feb;12(2): 143-50 9.  Aortic Atherosclerosis (ICD10-I70.0).   02/09/2022 Procedure   Bronchoscopy under the care of Dr. Valeta Harms    02/09/2022 Pathology Results   FINAL MICROSCOPIC DIAGNOSIS:   A. LUNG, RUL, FINE NEEDLE ASPIRATION  BIOPSY FORCEPS:  Adenocarcinoma with morphologic features consistent with metastasis from a colorectal primary.  Please see comment.   Comment: The following immunostains are performed with appropriate controls :  TTF-1: Negative .  Napsin A: Negative .  CK7: Negative.  CK20: Positive.  CDX2: Positive .   The above immuno histochemical profile is supportive of the rendered diagnosis.    08/18/2022 -  Chemotherapy   Patient is on Treatment Plan : COLORECTAL Bevacizumab q14d     Colon cancer metastasized to lung [facility])  02/09/2022 Pathology Results   FINAL MICROSCOPIC DIAGNOSIS:   A. LUNG, RUL, FINE NEEDLE ASPIRATION  BIOPSY FORCEPS:  Adenocarcinoma with morphologic features consistent with metastasis from a colorectal primary.  Please see comment.   Comment: The following immunostains are performed with appropriate controls :  TTF-1: Negative .  Napsin A: Negative .  CK7: Negative.  CK20: Positive.  CDX2: Positive .   The above immuno histochemical profile is supportive of the rendered diagnosis.    02/12/2022 Initial Diagnosis   Colon cancer metastasized to lung [facility])   03/03/2022 -  Chemotherapy  Patient is on Treatment Plan : COLORECTAL FOLFOX + Bevacizumab q14d     07/26/2022 Imaging    IMPRESSION: 1. Near-complete interval resolution of nodular and masslike areas of irregular consolidation, likely resolving infection or organizing pneumonia/drug reaction. 2. Additional previously seen pulmonary nodules are stable, consistent with treated pulmonary metastatic disease. 3. Mild peribronchovascular reticular opacities which are  most pronounced in the mid and lower lungs, similar to prior exam, progressed when compared with prior dated January 15, 2022. Findings can be seen in the setting of interstitial lung disease, pattern is most consistent with fibrotic NSIP. 4. Aortic Atherosclerosis (ICD10-I70.0).      INTERVAL HISTORY:  Thomas Knight is here for a follow up of  metastatic colon cancer   He was last seen by PA-C Cassie on 07/15/2022 He presents to the clinic accompanied by family. Py reports he is still having diarrhea. Pt states he has been taking the imodium and the Imotil. Pt states he is barely coughing. Pt reports of numbness and tingling and he is having trouble holding objects. Pt fell last Thursday.    All other systems were reviewed with the patient and are negative.  MEDICAL HISTORY:  Past Medical History:  Diagnosis Date   Bilateral leg edema 08/24/2019   Cancer ([place])    skin ca, colon   Chronic cough    Chronic pain of left knee 12/01/2015   COVID-19    06/2019   Dysrhythmia    afib  was cardioverted   Essential hypertension 08/24/2019   Mixed hyperlipidemia 08/24/2019   Obesity (BMI 30-39.9) 08/24/2019   Persistent atrial fibrillation ([place]) 08/24/2019    SURGICAL HISTORY: Past Surgical History:  Procedure Laterality Date   ACHILLES TENDON REPAIR Left 2012   Ruptured   BRONCHIAL BIOPSY  02/09/2022   Procedure: BRONCHIAL BIOPSIES;  Surgeon: Garner Nash, DO;  Location: [facility];  Service: Pulmonary;;   BRONCHIAL NEEDLE ASPIRATION BIOPSY  02/09/2022   Procedure: BRONCHIAL NEEDLE ASPIRATION BIOPSIES;  Surgeon: Garner Nash, DO;  Location: [facility];  Service: Pulmonary;;   COLONOSCOPY WITH PROPOFOL N/A 12/11/2021   Procedure: COLONOSCOPY WITH PROPOFOL;  Surgeon: Carol Ada, MD;  Location: [facility];  Service: Gastroenterology;  Laterality: N/A;   HERNIA REPAIR     umbilical hernia   IR IMAGING GUIDED PORT INSERTION  02/18/2022   SKIN SURGERY Left    Basal cell  carcinoma on left forearm   XI ROBOTIC ASSISTED LOWER ANTERIOR RESECTION N/A 01/09/2021   Procedure: XI ROBOTIC ASSISTED RIGHT HEMI COLECTOMY, LYSIS OF ADHESIONS, SMALL BOWEL RESECTION, INTRAOPERATIVE ASSESMENT OF PERFUSION, PARTIAL OMENTECTOMY;  Surgeon: Ileana Roup, MD;  Location: [facility];  Service: General;  Laterality: N/A;    I have reviewed the social history and family history with the patient and they are unchanged from previous note.  ALLERGIES:  is allergic to furosemide and tramadol.  MEDICATIONS:  Current Outpatient Medications  Medication Sig Dispense Refill   capecitabine (XELODA) 500 MG tablet Take 4 tabs every 12 hours, after meals. Take for 14 days then off 7 days 112 tablet 0   acetaminophen (TYLENOL) 500 MG tablet Take 500-1,000 mg by mouth every 6 (six) hours as needed (pain.).     amLODipine (NORVASC) 10 MG tablet Take 1 tablet (10 mg total) by mouth daily. 30 tablet 1   Ascorbic Acid (VITAMIN C WITH ROSE HIPS) 500 MG tablet Take 500 mg by mouth every evening.     Bacillus Coagulans-Inulin (ALIGN PREBIOTIC-PROBIOTIC PO) Take by  mouth.     Cholecalciferol (D3-1000) 25 MCG (1000 UT) capsule Take 1,000 Units by mouth every evening.     diphenoxylate-atropine (LOMOTIL) 2.5-0.025 MG tablet Take 1 tablet by mouth 4 (four) times daily as needed for diarrhea or loose stools. 30 tablet 0   ELIQUIS 5 MG TABS tablet Take 1 tablet (5 mg total) by mouth 2 (two) times daily. 180 tablet 1   levofloxacin (LEVAQUIN) 750 MG tablet Take 750 mg by mouth daily.     lidocaine-prilocaine (EMLA) cream Apply 1 Application topically as needed. 30 g 2   lidocaine-prilocaine (EMLA) cream Apply to affected area once 30 g 3   metoprolol tartrate (LOPRESSOR) 25 MG tablet Take 1 tablet (25 mg total) by mouth 2 (two) times daily. 180 tablet 3   Misc Natural Products (JOINT SUPPORT PO) Take 1 tablet by mouth daily. Instaflex Advanced Joint Support     nitroGLYCERIN (NITROSTAT) 0.4 MG SL tablet  Place 1 tablet (0.4 mg total) under the tongue every 5 (five) minutes as needed. 25 tablet 7   ondansetron (ZOFRAN) 8 MG tablet Take 1 tablet every 8 hours as needed for nausea/vomiting starting on 3 days after chemotherapy 30 tablet 2   ondansetron (ZOFRAN) 8 MG tablet Take 1 tablet (8 mg total) by mouth every 8 (eight) hours as needed for nausea or vomiting. Start on the third day after chemotherapy. 30 tablet 1   potassium chloride SA (KLOR-CON M) 20 MEQ tablet Take 1 tablet (20 mEq total) by mouth daily. 7 tablet 0   prochlorperazine (COMPAZINE) 10 MG tablet Take 1 tablet (10 mg total) by mouth every 6 (six) hours as needed. 30 tablet 2   prochlorperazine (COMPAZINE) 10 MG tablet Take 1 tablet (10 mg total) by mouth every 6 (six) hours as needed for nausea or vomiting. 30 tablet 1   sacubitril-valsartan (ENTRESTO) 24-26 MG Take 1 tablet by mouth 2 (two) times daily. 180 tablet 3   spironolactone (ALDACTONE) 50 MG tablet Take 1 tablet (50 mg total) by mouth daily. 10 tablet 0   vitamin B-12 (CYANOCOBALAMIN) 1000 MCG tablet Take 1,000 mcg by mouth every evening.     Zinc 50 MG CAPS Take 50 mg by mouth every evening.     No current facility-administered medications for this visit.    PHYSICAL EXAMINATION: ECOG PERFORMANCE STATUS: 1 - Symptomatic but completely ambulatory  Vitals:   07/28/22 0932  BP: (!) 156/92  Pulse: 68  Resp: 16  Temp: 97.8 F (36.6 C)  SpO2: 99%   Wt Readings from Last 3 Encounters:  07/28/22 230 lb 14.4 oz (104.7 kg)  07/15/22 235 lb 8 oz (106.8 kg)  06/30/22 236 lb 12.8 oz (107.4 kg)      GENERAL:alert, no distress and comfortable SKIN: skin color normal, no rashes or significant lesions EYES: normal, Conjunctiva are pink and non-injected, sclera clear  NEURO: alert & oriented x 3 with fluent speech   LABORATORY DATA:  I have reviewed the data as listed    Latest Ref Rng & Units 07/28/2022    9:14 AM 07/15/2022    8:10 AM 06/30/2022    9:09 AM  CBC   WBC 4.0 - 10.5 K/uL 5.6  4.9  6.3   Hemoglobin 13.0 - 17.0 g/dL 14.7  14.3  14.3   Hematocrit 39.0 - 52.0 % 42.5  41.3  41.7   Platelets 150 - 400 K/uL 114  129  126         Latest  Ref Rng & Units 07/28/2022    9:14 AM 07/15/2022    8:10 AM 06/30/2022    9:09 AM  CMP  Glucose 70 - 99 mg/dL 94  114  100   BUN 8 - 23 mg/dL 8  7  9    Creatinine 0.61 - 1.24 mg/dL 0.81  0.73  0.83   Sodium 135 - 145 mmol/L 142  142  141   Potassium 3.5 - 5.1 mmol/L 3.3  3.1  3.2   Chloride 98 - 111 mmol/L 108  109  108   CO2 22 - 32 mmol/L 29  28  27    Calcium 8.9 - 10.3 mg/dL 8.9  8.6  8.8   Total Protein 6.5 - 8.1 g/dL 6.4  5.8  6.1   Total Bilirubin 0.3 - 1.2 mg/dL 0.7  0.6  0.6   Alkaline Phos 38 - 126 U/L 54  55  58   AST 15 - 41 U/L 18  16  18    ALT 0 - 44 U/L 12  13  14        RADIOGRAPHIC STUDIES: I have personally reviewed the radiological images as listed and agreed with the findings in the report. No results found.    Orders Placed This Encounter  Procedures   CBC with Differential ([facility] Only)    Standing Status:   Future    Standing Expiration Date:   08/19/2023   Total Protein, Urine dipstick    Standing Status:   Future    Standing Expiration Date:   08/19/2023   All questions were answered. The patient knows to call the clinic with any problems, questions or concerns. No barriers to learning was detected. The total time spent in the appointment was 40 minutes.     Truitt Merle, MD 07/28/2022   Felicity Coyer, CMA, am acting as scribe for Truitt Merle, MD.   I have reviewed the above documentation for accuracy and completeness, and I agree with the above.

## 2022-07-27 NOTE — Telephone Encounter (Signed)
Spoke with patient confirming appointment change

## 2022-07-28 ENCOUNTER — Inpatient Hospital Stay: Payer: Medicare HMO

## 2022-07-28 ENCOUNTER — Telehealth: Payer: Self-pay | Admitting: Pharmacist

## 2022-07-28 ENCOUNTER — Encounter: Payer: Self-pay | Admitting: Hematology

## 2022-07-28 ENCOUNTER — Inpatient Hospital Stay (HOSPITAL_BASED_OUTPATIENT_CLINIC_OR_DEPARTMENT_OTHER): Payer: Medicare HMO | Admitting: Hematology

## 2022-07-28 ENCOUNTER — Telehealth: Payer: Self-pay | Admitting: Pharmacy Technician

## 2022-07-28 ENCOUNTER — Other Ambulatory Visit (HOSPITAL_COMMUNITY): Payer: Self-pay

## 2022-07-28 VITALS — BP 156/92 | HR 68 | Temp 97.8°F | Resp 16 | Ht 71.0 in | Wt 230.9 lb

## 2022-07-28 DIAGNOSIS — Z452 Encounter for adjustment and management of vascular access device: Secondary | ICD-10-CM | POA: Diagnosis not present

## 2022-07-28 DIAGNOSIS — Z5111 Encounter for antineoplastic chemotherapy: Secondary | ICD-10-CM | POA: Diagnosis not present

## 2022-07-28 DIAGNOSIS — C184 Malignant neoplasm of transverse colon: Secondary | ICD-10-CM | POA: Diagnosis not present

## 2022-07-28 DIAGNOSIS — C189 Malignant neoplasm of colon, unspecified: Secondary | ICD-10-CM

## 2022-07-28 DIAGNOSIS — C182 Malignant neoplasm of ascending colon: Secondary | ICD-10-CM

## 2022-07-28 DIAGNOSIS — Z5112 Encounter for antineoplastic immunotherapy: Secondary | ICD-10-CM | POA: Diagnosis not present

## 2022-07-28 DIAGNOSIS — G620 Drug-induced polyneuropathy: Secondary | ICD-10-CM | POA: Diagnosis not present

## 2022-07-28 DIAGNOSIS — C7800 Secondary malignant neoplasm of unspecified lung: Secondary | ICD-10-CM

## 2022-07-28 DIAGNOSIS — Z95828 Presence of other vascular implants and grafts: Secondary | ICD-10-CM

## 2022-07-28 DIAGNOSIS — I509 Heart failure, unspecified: Secondary | ICD-10-CM | POA: Diagnosis not present

## 2022-07-28 DIAGNOSIS — R197 Diarrhea, unspecified: Secondary | ICD-10-CM | POA: Diagnosis not present

## 2022-07-28 DIAGNOSIS — I110 Hypertensive heart disease with heart failure: Secondary | ICD-10-CM | POA: Diagnosis not present

## 2022-07-28 DIAGNOSIS — Z79899 Other long term (current) drug therapy: Secondary | ICD-10-CM | POA: Diagnosis not present

## 2022-07-28 DIAGNOSIS — E041 Nontoxic single thyroid nodule: Secondary | ICD-10-CM | POA: Diagnosis not present

## 2022-07-28 LAB — CMP (CANCER CENTER ONLY)
ALT: 12 U/L (ref 0–44)
AST: 18 U/L (ref 15–41)
Albumin: 3.4 g/dL — ABNORMAL LOW (ref 3.5–5.0)
Alkaline Phosphatase: 54 U/L (ref 38–126)
Anion gap: 5 (ref 5–15)
BUN: 8 mg/dL (ref 8–23)
CO2: 29 mmol/L (ref 22–32)
Calcium: 8.9 mg/dL (ref 8.9–10.3)
Chloride: 108 mmol/L (ref 98–111)
Creatinine: 0.81 mg/dL (ref 0.61–1.24)
GFR, Estimated: >60 mL/min (ref >60)
Glucose, Bld: 94 mg/dL (ref 70–99)
Potassium: 3.3 mmol/L — ABNORMAL LOW (ref 3.5–5.1)
Sodium: 142 mmol/L (ref 135–145)
Total Bilirubin: 0.7 mg/dL (ref 0.3–1.2)
Total Protein: 6.4 g/dL — ABNORMAL LOW (ref 6.5–8.1)

## 2022-07-28 LAB — CBC WITH DIFFERENTIAL (CANCER CENTER ONLY)
Abs Immature Granulocytes: 0.01 10*3/uL (ref 0.00–0.07)
Basophils Absolute: 0 10*3/uL (ref 0.0–0.1)
Basophils Relative: 1 %
Eosinophils Absolute: 0.1 10*3/uL (ref 0.0–0.5)
Eosinophils Relative: 2 %
HCT: 42.5 % (ref 39.0–52.0)
Hemoglobin: 14.7 g/dL (ref 13.0–17.0)
Immature Granulocytes: 0 %
Lymphocytes Relative: 26 %
Lymphs Abs: 1.5 10*3/uL (ref 0.7–4.0)
MCH: 33.6 pg (ref 26.0–34.0)
MCHC: 34.6 g/dL (ref 30.0–36.0)
MCV: 97 fL (ref 80.0–100.0)
Monocytes Absolute: 0.8 10*3/uL (ref 0.1–1.0)
Monocytes Relative: 15 %
Neutro Abs: 3.1 10*3/uL (ref 1.7–7.7)
Neutrophils Relative %: 56 %
Platelet Count: 114 10*3/uL — ABNORMAL LOW (ref 150–400)
RBC: 4.38 MIL/uL (ref 4.22–5.81)
RDW: 17.1 % — ABNORMAL HIGH (ref 11.5–15.5)
WBC Count: 5.6 10*3/uL (ref 4.0–10.5)
nRBC: 0 % (ref 0.0–0.2)

## 2022-07-28 LAB — TOTAL PROTEIN, URINE DIPSTICK: Protein, ur: <30 mg/dL — AB

## 2022-07-28 MED ORDER — CAPECITABINE 500 MG PO TABS
ORAL_TABLET | ORAL | 0 refills | Status: DC
  Filled 2022-07-28: qty 112, fill #0

## 2022-07-28 MED ORDER — CAPECITABINE 500 MG PO TABS
ORAL_TABLET | ORAL | 0 refills | Status: DC
  Filled 2022-07-28: qty 112, fill #0
  Filled 2022-08-10: qty 112, 21d supply, fill #0

## 2022-07-28 MED ORDER — HEPARIN SOD (PORK) LOCK FLUSH 100 UNIT/ML IV SOLN
500.0000 [IU] | INTRAVENOUS | Status: AC | PRN
  Administered 2022-07-28: 500 [IU]

## 2022-07-28 MED ORDER — SODIUM CHLORIDE 0.9% FLUSH
10.0000 mL | INTRAVENOUS | Status: AC | PRN
  Administered 2022-07-28: 10 mL

## 2022-07-28 NOTE — Telephone Encounter (Signed)
Oral Oncology Patient Advocate Encounter  Prior Authorization for Capecitabine has been approved.    PA# [id number] Effective dates: 07/28/22 through 07/29/23  Patients co-pay is $200.    Lady Deutscher, CPhT-Adv Oncology Pharmacy Patient [place] Direct Number: [phone]  Fax: [phone]

## 2022-07-28 NOTE — Telephone Encounter (Signed)
Oral Oncology Pharmacist Encounter  Received new prescription for Xeloda (capecitabine) for the maintenance treatment of metastatic colon cancer in conjunction with bevacizumab, planned duration 6 months. Plan for patient to receive last cycle of FOLFOX 07/29/22, and transition to Xeloda ~08/18/22.  CBC w/ Diff and CMP from 07/28/22 assessed, no baseline dose adjustments required. Prescription dose and frequency assessed.  Current medication list in Epic reviewed, DDIs with Xeloda identified: Category C DDI between Xeloda and Ondansetron due to risk of Qtc prolongation with fluorouracil products. Noted patient only taking PRN and PO route, risk higher with IV administration. No change in therapy warranted at this time.   Evaluated chart and no patient barriers to medication adherence noted.   Patient agreement for treatment documented in MD note on 07/28/22.  Prescription has been e-scribed to the [facility] for benefits analysis and approval.  Oral Oncology Clinic will continue to follow for insurance authorization, copayment issues, initial counseling and start date.  Leron Croak, PharmD, BCPS, BCOP Hematology/Oncology Clinical Pharmacist [facility] [place] [phone] 07/28/2022 1:10 PM

## 2022-07-28 NOTE — Telephone Encounter (Signed)
Oral Oncology Patient Advocate Encounter  After completing a benefits investigation, prior authorization for Capecitabine is not required at this time through [facility].  Patient's copay is $657.20.     Lady Deutscher, CPhT-Adv Oncology Pharmacy Patient [place] Direct Number: [phone]  Fax: [phone]

## 2022-07-28 NOTE — Telephone Encounter (Signed)
Oral Oncology Patient Advocate Encounter   Received notification that prior authorization for Capecitabine is required under patients [place]   PA submitted on 07/28/22 Key [id number] Status is pending     Lady Deutscher, CPhT-Adv Oncology Pharmacy Patient [place] Direct Number: [phone]  Fax: [phone]

## 2022-07-29 ENCOUNTER — Inpatient Hospital Stay: Payer: Medicare HMO | Attending: Physician Assistant

## 2022-07-29 VITALS — BP 135/88 | HR 66 | Temp 97.9°F | Resp 18 | Wt 230.0 lb

## 2022-07-29 DIAGNOSIS — C189 Malignant neoplasm of colon, unspecified: Secondary | ICD-10-CM

## 2022-07-29 DIAGNOSIS — C184 Malignant neoplasm of transverse colon: Secondary | ICD-10-CM | POA: Insufficient documentation

## 2022-07-29 DIAGNOSIS — Z452 Encounter for adjustment and management of vascular access device: Secondary | ICD-10-CM | POA: Insufficient documentation

## 2022-07-29 DIAGNOSIS — Z5111 Encounter for antineoplastic chemotherapy: Secondary | ICD-10-CM | POA: Insufficient documentation

## 2022-07-29 DIAGNOSIS — C7801 Secondary malignant neoplasm of right lung: Secondary | ICD-10-CM | POA: Insufficient documentation

## 2022-07-29 DIAGNOSIS — Z79899 Other long term (current) drug therapy: Secondary | ICD-10-CM | POA: Diagnosis not present

## 2022-07-29 DIAGNOSIS — Z5112 Encounter for antineoplastic immunotherapy: Secondary | ICD-10-CM | POA: Insufficient documentation

## 2022-07-29 DIAGNOSIS — C7800 Secondary malignant neoplasm of unspecified lung: Secondary | ICD-10-CM | POA: Diagnosis not present

## 2022-07-29 MED ORDER — DEXTROSE 5 % IV SOLN: Freq: Once | INTRAVENOUS | Status: AC

## 2022-07-29 MED ORDER — LEUCOVORIN CALCIUM INJECTION 350 MG
400.0000 mg/m2 | Freq: Once | INTRAVENOUS | Status: AC
  Administered 2022-07-29: 932 mg via INTRAVENOUS
  Filled 2022-07-29: qty 46.6

## 2022-07-29 MED ORDER — OXALIPLATIN CHEMO INJECTION 100 MG/20ML
70.0000 mg/m2 | Freq: Once | INTRAVENOUS | Status: AC
  Administered 2022-07-29: 165 mg via INTRAVENOUS
  Filled 2022-07-29: qty 13

## 2022-07-29 MED ORDER — SODIUM CHLORIDE 0.9 % IV SOLN
5.0000 mg/kg | Freq: Once | INTRAVENOUS | Status: AC
  Administered 2022-07-29: 500 mg via INTRAVENOUS
  Filled 2022-07-29: qty 4

## 2022-07-29 MED ORDER — PALONOSETRON HCL INJECTION 0.25 MG/5ML
0.2500 mg | Freq: Once | INTRAVENOUS | Status: AC
  Administered 2022-07-29: 0.25 mg via INTRAVENOUS
  Filled 2022-07-29: qty 5

## 2022-07-29 MED ORDER — HEPARIN SOD (PORK) LOCK FLUSH 100 UNIT/ML IV SOLN: 500.0000 [IU] | Freq: Once | INTRAVENOUS | Status: DC | PRN

## 2022-07-29 MED ORDER — SODIUM CHLORIDE 0.9 % IV SOLN
2400.0000 mg/m2 | INTRAVENOUS | Status: DC
  Administered 2022-07-29: 5600 mg via INTRAVENOUS
  Filled 2022-07-29: qty 112

## 2022-07-29 MED ORDER — SODIUM CHLORIDE 0.9 % IV SOLN: Freq: Once | INTRAVENOUS | Status: AC

## 2022-07-29 MED ORDER — SODIUM CHLORIDE 0.9% FLUSH: 10.0000 mL | INTRAVENOUS | Status: DC | PRN

## 2022-07-29 MED ORDER — SODIUM CHLORIDE 0.9 % IV SOLN
10.0000 mg | Freq: Once | INTRAVENOUS | Status: AC
  Administered 2022-07-29: 10 mg via INTRAVENOUS
  Filled 2022-07-29: qty 10

## 2022-07-29 NOTE — Patient Instructions (Signed)
[facility]  Discharge Instructions: Thank you for choosing [facility] to provide your oncology and hematology care.   If you have a lab appointment with the [facility], please go directly to the [facility] and check in at the registration area.   Wear comfortable clothing and clothing appropriate for easy access to any Portacath or PICC line.   We strive to give you quality time with your provider. You may need to reschedule your appointment if you arrive late (15 or more minutes).  Arriving late affects you and other patients whose appointments are after yours.  Also, if you miss three or more appointments without notifying the office, you may be dismissed from the clinic at the provider's discretion.      For prescription refill requests, have your pharmacy contact our office and allow 72 hours for refills to be completed.    Today you received the following chemotherapy and/or immunotherapy agents: Zirabev, Oxaliplatin, Leucovorin, Fluorouracil.       To help prevent nausea and vomiting after your treatment, we encourage you to take your nausea medication as directed.  BELOW ARE SYMPTOMS THAT SHOULD BE REPORTED IMMEDIATELY: *FEVER GREATER THAN 100.4 F (38 C) OR HIGHER *CHILLS OR SWEATING *NAUSEA AND VOMITING THAT IS NOT CONTROLLED WITH YOUR NAUSEA MEDICATION *UNUSUAL SHORTNESS OF BREATH *UNUSUAL BRUISING OR BLEEDING *URINARY PROBLEMS (pain or burning when urinating, or frequent urination) *BOWEL PROBLEMS (unusual diarrhea, constipation, pain near the anus) TENDERNESS IN MOUTH AND THROAT WITH OR WITHOUT PRESENCE OF ULCERS (sore throat, sores in mouth, or a toothache) UNUSUAL RASH, SWELLING OR PAIN  UNUSUAL VAGINAL DISCHARGE OR ITCHING   Items with * indicate a potential emergency and should be followed up as soon as possible or go to the Emergency Department if any problems should occur.  Please show the CHEMOTHERAPY ALERT  CARD or IMMUNOTHERAPY ALERT CARD at check-in to the Emergency Department and triage nurse.  Should you have questions after your visit or need to cancel or reschedule your appointment, please contact [facility]  Dept: [phone]  and follow the prompts.  Office hours are 8:00 a.m. to 4:30 p.m. Monday - Friday. Please note that voicemails left after 4:00 p.m. may not be returned until the following business day.  We are closed weekends and major holidays. You have access to a nurse at all times for urgent questions. Please call the main number to the clinic Dept: [phone] and follow the prompts.   For any non-urgent questions, you may also contact your provider using MyChart. We now offer e-Visits for anyone 62 and older to request care online for non-urgent symptoms. For details visit [email or web address].   Also download the MyChart app! Go to the app store, search "MyChart", open the app, select [facility], and log in with your MyChart username and password.

## 2022-07-30 ENCOUNTER — Telehealth: Payer: Self-pay | Admitting: Cardiology

## 2022-07-30 MED ORDER — SACUBITRIL-VALSARTAN 24-26 MG PO TABS: 1.0000 | ORAL_TABLET | Freq: Two times a day (BID) | ORAL | 3 refills | Status: DC

## 2022-07-30 NOTE — Telephone Encounter (Signed)
Spoke with patient who wanted to understand EF. Explained EF and how entresto can help the heart pump more effectively. He asked about using his incentive spirometer; education given. Discussed Mediterranean diet (sent to his MyChart) and exercise. Suggested walking for 10 minutes once or twice a day and gradually build up time; to stop if SOB and to advise our clinic if he has SOB. He stated chemo has help reduce lung nodules.

## 2022-07-30 NOTE — Telephone Encounter (Signed)
Patient stated his heart function has declined to 45% and he would like to know if there is anything else he needs to be doing to get his HR back up.

## 2022-07-31 ENCOUNTER — Inpatient Hospital Stay: Payer: Medicare HMO

## 2022-07-31 VITALS — BP 130/77 | HR 61 | Temp 98.1°F | Resp 17

## 2022-07-31 DIAGNOSIS — Z79899 Other long term (current) drug therapy: Secondary | ICD-10-CM | POA: Diagnosis not present

## 2022-07-31 DIAGNOSIS — C184 Malignant neoplasm of transverse colon: Secondary | ICD-10-CM | POA: Diagnosis not present

## 2022-07-31 DIAGNOSIS — C189 Malignant neoplasm of colon, unspecified: Secondary | ICD-10-CM

## 2022-07-31 DIAGNOSIS — Z5112 Encounter for antineoplastic immunotherapy: Secondary | ICD-10-CM | POA: Diagnosis not present

## 2022-07-31 DIAGNOSIS — C7801 Secondary malignant neoplasm of right lung: Secondary | ICD-10-CM | POA: Diagnosis not present

## 2022-07-31 DIAGNOSIS — Z452 Encounter for adjustment and management of vascular access device: Secondary | ICD-10-CM | POA: Diagnosis not present

## 2022-07-31 DIAGNOSIS — Z5111 Encounter for antineoplastic chemotherapy: Secondary | ICD-10-CM | POA: Diagnosis not present

## 2022-07-31 MED ORDER — SODIUM CHLORIDE 0.9% FLUSH
10.0000 mL | INTRAVENOUS | Status: DC | PRN
  Administered 2022-07-31: 10 mL

## 2022-07-31 MED ORDER — HEPARIN SOD (PORK) LOCK FLUSH 100 UNIT/ML IV SOLN
500.0000 [IU] | Freq: Once | INTRAVENOUS | Status: AC | PRN
  Administered 2022-07-31: 500 [IU]

## 2022-07-31 NOTE — Patient Instructions (Signed)

## 2022-08-04 ENCOUNTER — Telehealth: Payer: Self-pay

## 2022-08-04 ENCOUNTER — Other Ambulatory Visit: Payer: Self-pay | Admitting: Nurse Practitioner

## 2022-08-04 DIAGNOSIS — R197 Diarrhea, unspecified: Secondary | ICD-10-CM

## 2022-08-04 DIAGNOSIS — C189 Malignant neoplasm of colon, unspecified: Secondary | ICD-10-CM

## 2022-08-04 MED ORDER — DIPHENOXYLATE-ATROPINE 2.5-0.025 MG PO TABS: 1.0000 | ORAL_TABLET | Freq: Four times a day (QID) | ORAL | 3 refills | Status: DC | PRN

## 2022-08-04 NOTE — Telephone Encounter (Signed)
Pt called requesting a refill on Lomotil.  Notified Dr. Burr Medico and Cira Rue, NP regarding refill request.

## 2022-08-10 ENCOUNTER — Other Ambulatory Visit: Payer: Self-pay

## 2022-08-10 ENCOUNTER — Other Ambulatory Visit (HOSPITAL_COMMUNITY): Payer: Self-pay

## 2022-08-10 NOTE — Telephone Encounter (Addendum)
Oral Chemotherapy Pharmacy Student Encounter  Start date: 08/18/2022 after appointment with Dr. Burr Medico  Patient Education I spoke with patient for overview of new oral chemotherapy medication: Xeloda (capecitabine) with bevacizumab for the treatment of metastatic colon cancer, planned duration until disease progression or unacceptable toxicity (~6 months).   Pt is doing well. Counseled patient on administration, dosing, side effects, monitoring, drug-food interactions, safe handling, storage, and disposal. Patient will take 4 capsules (2000 mg) by mouth twice daily 30 minutes after meals on days 1-14. Hold for 7 days. Repeat every 21 days.  Side effects include but not limited to: Diarrhea (discussed use of Imodium) Cytopenias (discussed the importance of attending lab draws when they are scheduled) Hand/foot syndrome (discussed the use of moisturizer at least twice daily) Mouth sores (discussed maintaining oral hygiene and use of salt/baking soda mouthwash if needed)   Reviewed with patient importance of keeping a medication schedule and plan for any missed doses.  After discussion with patient, no patient barriers to medication adherence identified.   Mr. Fiola and his wife voiced understanding and appreciation. All questions answered. Medication handout provided.  Provided patient with [facility] phone number. Patient knows to call the office with questions or concerns.  [employer] PharmD Candidate [phone] 08/10/2022 12:01 PM

## 2022-08-17 NOTE — Progress Notes (Unsigned)
[name]   [phone] [phone]   Clinic Follow up Note   Patient Care Team: Angelina Sheriff, MD as PCP - General (Family Medicine) Berniece Salines, DO as PCP - Cardiology (Cardiology) Truitt Merle, MD as Attending Physician (Hematology and Oncology)  Date of Service:  08/18/2022  CHIEF COMPLAINT: f/u of metastatic colon cancer     CURRENT THERAPY:  - Maintenance Xeloda 500 mg -Bevacizumab    ASSESSMENT:  Thomas Knight is a 77 y.o. male with   Cancer of right colon ([place]) -stage II (pT3, N0), metastatic disease to lung and possible nodes in July 2023, MSS, KRAS/NRAS/BRAF wild type   -found on screening colonoscopy. S/p right hemicolectomy 01/09/21. -lung metastasis confirmed in RUL by bronchoscopy on 02/09/22. -FoundationOne showed MSS, low tumor burden, KRAS/NRAS/BRAF wild-type, no other targetable mutations. The benefit of EGFR inhibitor is much less in right-sided colon cancer, so I will not give it as first line but may consider in subsequent lines.  -he started FOLFOX on 03/03/22. He tolerated well overall with diarrhea for several days. Bevacizumab added with C2 (9/20).  -CT scan 05/26/2022 showed improved lung mets, but showed new infiltrative lung change, possible related to his recent URI -repeated CT chest on 07/26/2022 showed stable known lung mets (overall excellent response, with minimum residual dsease) and resolved previously see inflammatory change in RLL -I have changed treatment to maintenance xeloda and beva every 3 weeks -given his minimum residual disease, will hold on consolidation RT for now      PLAN: - Reiterating directions on how to take Xeloda and discuss its side effects. -Proceed with Beva today -lab reviewed -Potassium  2.7, will give iv KCL 41meq today and called in oral KCL -Phone visit in 1 week -lab,f/u  and Beva in 3 weeks   SUMMARY OF ONCOLOGIC HISTORY: Oncology History Overview Note   Cancer Staging   Cancer of right colon [facility]) Staging form: Colon and Rectum, AJCC 8th Edition - Pathologic stage from 01/09/2021: Stage IIA (pT3, pN0, cM0) - Signed by Truitt Merle, MD on 02/12/2022 Total positive nodes: 0 Histologic grading system: 4 grade system Histologic grade (G): G2 Residual tumor (R): R0 - None     Cancer of right colon ([place])  10/2020 Procedure   Colonoscopy-Dr. Benson Norway     11/06/2020 Imaging   CT CHEST ABDOMEN PELVIS W CONTRAST   IMPRESSION: 1. 4.6 cm partially circumferential apple-core type neoplastic process involving the mid transverse colon. No findings for locoregional adenopathy or distant metastatic disease. 2. Enlarged prostate gland with median lobe hypertrophy impressing on the base of the bladder. 3. 16 mm left thyroid nodule. Recommend follow-up thyroid ultrasound examination.   01/09/2021 Initial Diagnosis   Colon cancer ([place])   01/09/2021 Pathology Results   B. COLON, RIGHT, RESECTION:  -  Adenocarcinoma, moderately differentiated, 3.0 cm  -  No carcinoma identified in seventeen lymph nodes (0/17)  -  Margins uninvolved by carcinoma  -  Tubular adenoma (x2)  -  Benign appendix  -  See oncology table and comment below   Margin Status for Invasive Carcinoma: All margins negative for  invasive carcinoma       Distance from Invasive Carcinoma to Radial (Circumferential)       Margin Status for Non-Invasive Tumor: All margins negative for  high-grade dysplasia / intramucosal carcinoma and low-grade dysplasia  Regional Lymph Nodes:       Number of Lymph Nodes with Tumor: 0  Number of Lymph Nodes Examined: 17  Tumor Deposits: 0  Distant Metastasis:       Distant Site(s) Involved: Not applicable  Pathologic Stage Classification (pTNM, AJCC 8th Edition): pT3, pN0   MMR Stable    01/09/2021 Cancer Staging   Staging form: Colon and Rectum, AJCC 8th Edition - Pathologic stage from 01/09/2021: Stage IIA (pT3, pN0, cM0) - Signed by Truitt Merle, MD on  02/12/2022 Total positive nodes: 0 Histologic grading system: 4 grade system Histologic grade (G): G2 Residual tumor (R): R0 - None   05/2021 Tumor Marker   Patient's tumor was tested for the following markers: CEA. Results of the tumor marker test revealed 2.1.   12/11/2021 Procedure   Colonoscopy-Dr. Benson Norway  There was evidence of a prior functional end-to-end ileo-colonic anastomosis in the transverse colon. This was patent and was characterized by healthy appearing mucosa. The anastomosis was traversed. Findings: Scattered small and large-mouthed diverticula were found in the sigmoid colon. - Patent functional end-to-end ileo-colonic anastomosis, characterized by healthy appearing mucosa. - Diverticulosis in the sigmoid colon. - No specimens collected.   01/25/2022 Imaging   CT CHEST ABDOMEN PELVIS W CONTRAST   IMPRESSION: 1. New right-sided pulmonary nodules measure up to 11 mm, nonspecific but concerning for metastatic disease. Consider further evaluation with nuclear medicine PET/CT. 2. New nodularity/lymph nodes in the central mesentery, concerning for nodal disease involvement. 3. Slightly increased size of prominent retroperitoneal lymph nodes, nonspecific but also somewhat concerning for nodal disease involvement. 4. Surgical change of partial colectomy without evidence of local recurrence. 5. Stable prominent mediastinal lymph nodes, nonspecific but stability is reassuring. Attention on follow-up imaging suggested. 6. Stable prominent right external iliac lymph node, nonspecific but stability is reassuring. Attention on follow-up imaging suggested. 7. Stable hypodense 8 mm hepatic lesion technically too small to accurately characterize but stability is reassuring. Attention on follow-up imaging suggested. 8. 1.9 cm incidental left thyroid nodule. Recommend thyroid US. Reference: J Am Coll Radiol. 2015 Feb;12(2): 143-50 9.  Aortic Atherosclerosis (ICD10-I70.0).    02/09/2022 Procedure   Bronchoscopy under the care of Dr. Valeta Harms    02/09/2022 Pathology Results   FINAL MICROSCOPIC DIAGNOSIS:   A. LUNG, RUL, FINE NEEDLE ASPIRATION  BIOPSY FORCEPS:  Adenocarcinoma with morphologic features consistent with metastasis from a colorectal primary.  Please see comment.   Comment: The following immunostains are performed with appropriate controls :  TTF-1: Negative .  Napsin A: Negative .  CK7: Negative.  CK20: Positive.  CDX2: Positive .   The above immuno histochemical profile is supportive of the rendered diagnosis.    08/18/2022 -  Chemotherapy   Patient is on Treatment Plan : COLORECTAL Bevacizumab q14d     Colon cancer metastasized to lung [facility])  02/09/2022 Pathology Results   FINAL MICROSCOPIC DIAGNOSIS:   A. LUNG, RUL, FINE NEEDLE ASPIRATION  BIOPSY FORCEPS:  Adenocarcinoma with morphologic features consistent with metastasis from a colorectal primary.  Please see comment.   Comment: The following immunostains are performed with appropriate controls :  TTF-1: Negative .  Napsin A: Negative .  CK7: Negative.  CK20: Positive.  CDX2: Positive .   The above immuno histochemical profile is supportive of the rendered diagnosis.    02/12/2022 Initial Diagnosis   Colon cancer metastasized to lung ([place])   03/03/2022 - 07/31/2022 Chemotherapy   Patient is on Treatment Plan : COLORECTAL FOLFOX + Bevacizumab q14d     07/26/2022 Imaging    IMPRESSION: 1. Near-complete interval resolution of nodular and  masslike areas of irregular consolidation, likely resolving infection or organizing pneumonia/drug reaction. 2. Additional previously seen pulmonary nodules are stable, consistent with treated pulmonary metastatic disease. 3. Mild peribronchovascular reticular opacities which are most pronounced in the mid and lower lungs, similar to prior exam, progressed when compared with prior dated January 15, 2022. Findings can be seen in the setting of  interstitial lung disease, pattern is most consistent with fibrotic NSIP. 4. Aortic Atherosclerosis (ICD10-I70.0).      INTERVAL HISTORY:  Thomas Knight is here for a follow up of  metastatic colon cancer   He was last seen by me on 07/28/2022 He presents to the clinic accompanied by wife. Pt states a week ago he had diarrhea and went to the restroom 15 times.Pt took imodium and it has helped. Pt had questions about radiation. Pt states he has been exercising.      All other systems were reviewed with the patient and are negative.  MEDICAL HISTORY:  Past Medical History:  Diagnosis Date   Bilateral leg edema 08/24/2019   Cancer ([place])    skin ca, colon   Chronic cough    Chronic pain of left knee 12/01/2015   COVID-19    06/2019   Dysrhythmia    afib  was cardioverted   Essential hypertension 08/24/2019   Mixed hyperlipidemia 08/24/2019   Obesity (BMI 30-39.9) 08/24/2019   Persistent atrial fibrillation ([place]) 08/24/2019    SURGICAL HISTORY: Past Surgical History:  Procedure Laterality Date   ACHILLES TENDON REPAIR Left 2012   Ruptured   BRONCHIAL BIOPSY  02/09/2022   Procedure: BRONCHIAL BIOPSIES;  Surgeon: Garner Nash, DO;  Location: [facility];  Service: Pulmonary;;   BRONCHIAL NEEDLE ASPIRATION BIOPSY  02/09/2022   Procedure: BRONCHIAL NEEDLE ASPIRATION BIOPSIES;  Surgeon: Garner Nash, DO;  Location: [facility];  Service: Pulmonary;;   COLONOSCOPY WITH PROPOFOL N/A 12/11/2021   Procedure: COLONOSCOPY WITH PROPOFOL;  Surgeon: Carol Ada, MD;  Location: [facility];  Service: Gastroenterology;  Laterality: N/A;   HERNIA REPAIR     umbilical hernia   IR IMAGING GUIDED PORT INSERTION  02/18/2022   SKIN SURGERY Left    Basal cell carcinoma on left forearm   XI ROBOTIC ASSISTED LOWER ANTERIOR RESECTION N/A 01/09/2021   Procedure: XI ROBOTIC ASSISTED RIGHT HEMI COLECTOMY, LYSIS OF ADHESIONS, SMALL BOWEL RESECTION, INTRAOPERATIVE ASSESMENT OF PERFUSION, PARTIAL  OMENTECTOMY;  Surgeon: Ileana Roup, MD;  Location: [facility];  Service: General;  Laterality: N/A;    I have reviewed the social history and family history with the patient and they are unchanged from previous note.  ALLERGIES:  is allergic to furosemide and tramadol.  MEDICATIONS:  Current Outpatient Medications  Medication Sig Dispense Refill   acetaminophen (TYLENOL) 500 MG tablet Take 500-1,000 mg by mouth every 6 (six) hours as needed (pain.).     amLODipine (NORVASC) 10 MG tablet Take 1 tablet (10 mg total) by mouth daily. 30 tablet 1   Ascorbic Acid (VITAMIN C WITH ROSE HIPS) 500 MG tablet Take 500 mg by mouth every evening.     Bacillus Coagulans-Inulin (ALIGN PREBIOTIC-PROBIOTIC PO) Take by mouth.     capecitabine (XELODA) 500 MG tablet Take 4 tablets (2000 mg total) by mouth 2 (two) times daily. Take within 30 minutes after meals. Take for 14 days, then off 7 days. Repeat every 21 days. 112 tablet 0   Cholecalciferol (D3-1000) 25 MCG (1000 UT) capsule Take 1,000 Units by mouth every evening.  diphenoxylate-atropine (LOMOTIL) 2.5-0.025 MG tablet Take 1 tablet by mouth 4 (four) times daily as needed for diarrhea or loose stools. 30 tablet 3   ELIQUIS 5 MG TABS tablet Take 1 tablet (5 mg total) by mouth 2 (two) times daily. 180 tablet 1   metoprolol tartrate (LOPRESSOR) 25 MG tablet Take 1 tablet (25 mg total) by mouth 2 (two) times daily. 180 tablet 3   Misc Natural Products (JOINT SUPPORT PO) Take 1 tablet by mouth daily. Instaflex Advanced Joint Support     nitroGLYCERIN (NITROSTAT) 0.4 MG SL tablet Place 1 tablet (0.4 mg total) under the tongue every 5 (five) minutes as needed. 25 tablet 7   sacubitril-valsartan (ENTRESTO) 24-26 MG Take 1 tablet by mouth 2 (two) times daily. 180 tablet 3   spironolactone (ALDACTONE) 50 MG tablet Take 1 tablet (50 mg total) by mouth daily. 10 tablet 0   vitamin B-12 (CYANOCOBALAMIN) 1000 MCG tablet Take 1,000 mcg by mouth every evening.      Zinc 50 MG CAPS Take 50 mg by mouth every evening.     No current facility-administered medications for this visit.    PHYSICAL EXAMINATION: ECOG PERFORMANCE STATUS: 1 - Symptomatic but completely ambulatory  Vitals:   08/18/22 0813  BP: (!) 158/78  Pulse: 79  Resp: 18  Temp: 100 F (37.8 C)  SpO2: 100%   Wt Readings from Last 3 Encounters:  08/18/22 233 lb 14.4 oz (106.1 kg)  07/29/22 230 lb (104.3 kg)  07/28/22 230 lb 14.4 oz (104.7 kg)     GENERAL:alert, no distress and comfortable SKIN: skin color normal, no rashes or significant lesions EYES: normal, Conjunctiva are pink and non-injected, sclera clear  NEURO: alert & oriented x 3 with fluent speech   LABORATORY DATA:  I have reviewed the data as listed    Latest Ref Rng & Units 08/18/2022    7:48 AM 07/28/2022    9:14 AM 07/15/2022    8:10 AM  CBC  WBC 4.0 - 10.5 K/uL 4.5  5.6  4.9   Hemoglobin 13.0 - 17.0 g/dL 14.6  14.7  14.3   Hematocrit 39.0 - 52.0 % 42.6  42.5  41.3   Platelets 150 - 400 K/uL 169  114  129         Latest Ref Rng & Units 08/18/2022    7:48 AM 07/28/2022    9:14 AM 07/15/2022    8:10 AM  CMP  Glucose 70 - 99 mg/dL 109  94  114   BUN 8 - 23 mg/dL 7  8  7    Creatinine 0.61 - 1.24 mg/dL 0.78  0.81  0.73   Sodium 135 - 145 mmol/L 143  142  142   Potassium 3.5 - 5.1 mmol/L 2.7  3.3  3.1   Chloride 98 - 111 mmol/L 107  108  109   CO2 22 - 32 mmol/L 30  29  28    Calcium 8.9 - 10.3 mg/dL 8.4  8.9  8.6   Total Protein 6.5 - 8.1 g/dL 6.1  6.4  5.8   Total Bilirubin 0.3 - 1.2 mg/dL 0.6  0.7  0.6   Alkaline Phos 38 - 126 U/L 57  54  55   AST 15 - 41 U/L 17  18  16    ALT 0 - 44 U/L 13  12  13        RADIOGRAPHIC STUDIES: I have personally reviewed the radiological images as listed and agreed with the findings  in the report. No results found.    Orders Placed This Encounter  Procedures   CBC with Differential ([facility] Only)    Standing Status:   Future    Standing Expiration Date:    09/09/2023   CBC with Differential ([facility] Center Only)    Standing Status:   Future    Standing Expiration Date:   09/30/2023   Total Protein, Urine dipstick    Standing Status:   Future    Standing Expiration Date:   09/30/2023   All questions were answered. The patient knows to call the clinic with any problems, questions or concerns. No barriers to learning was detected. The total time spent in the appointment was 30 minutes.     Truitt Merle, MD 08/18/2022   Felicity Coyer, CMA, am acting as scribe for Truitt Merle, MD.   I have reviewed the above documentation for accuracy and completeness, and I agree with the above.

## 2022-08-17 NOTE — Assessment & Plan Note (Signed)
-  stage II (pT3, N0), metastatic disease to lung and possible nodes in July 2023, MSS, KRAS/NRAS/BRAF wild type   -found on screening colonoscopy. S/p right hemicolectomy 01/09/21. -lung metastasis confirmed in RUL by bronchoscopy on 02/09/22. -FoundationOne showed MSS, low tumor burden, KRAS/NRAS/BRAF wild-type, no other targetable mutations. The benefit of EGFR inhibitor is much less in right-sided colon cancer, so I will not give it as first line but may consider in subsequent lines.  -he started FOLFOX on 03/03/22. He tolerated well overall with diarrhea for several days. Bevacizumab added with C2 (9/20).  -CT scan 05/26/2022 showed improved lung mets, but showed new infiltrative lung change, possible related to his recent URI -repeated CT chest on 07/26/2022 showed stable known lung mets (overall excellent response, with minimum residual dsease) and resolved previously see inflammatory change in RLL -I have changed treatment to maintenance xeloda and beva every 3 weeks -given his minimum residual disease, will hold on consolidation RT for now

## 2022-08-18 ENCOUNTER — Telehealth: Payer: Self-pay | Admitting: Hematology

## 2022-08-18 ENCOUNTER — Encounter: Payer: Self-pay | Admitting: Hematology

## 2022-08-18 ENCOUNTER — Inpatient Hospital Stay (HOSPITAL_BASED_OUTPATIENT_CLINIC_OR_DEPARTMENT_OTHER): Payer: Medicare HMO | Admitting: Hematology

## 2022-08-18 ENCOUNTER — Inpatient Hospital Stay: Payer: Medicare HMO

## 2022-08-18 ENCOUNTER — Other Ambulatory Visit: Payer: Self-pay

## 2022-08-18 ENCOUNTER — Telehealth: Payer: Self-pay

## 2022-08-18 VITALS — BP 138/80 | HR 81 | Resp 18

## 2022-08-18 VITALS — BP 158/78 | HR 79 | Temp 100.0°F | Resp 18 | Ht 71.0 in | Wt 233.9 lb

## 2022-08-18 DIAGNOSIS — Z79899 Other long term (current) drug therapy: Secondary | ICD-10-CM | POA: Diagnosis not present

## 2022-08-18 DIAGNOSIS — C182 Malignant neoplasm of ascending colon: Secondary | ICD-10-CM

## 2022-08-18 DIAGNOSIS — C189 Malignant neoplasm of colon, unspecified: Secondary | ICD-10-CM

## 2022-08-18 DIAGNOSIS — C184 Malignant neoplasm of transverse colon: Secondary | ICD-10-CM | POA: Diagnosis not present

## 2022-08-18 DIAGNOSIS — Z5112 Encounter for antineoplastic immunotherapy: Secondary | ICD-10-CM | POA: Diagnosis not present

## 2022-08-18 DIAGNOSIS — Z452 Encounter for adjustment and management of vascular access device: Secondary | ICD-10-CM | POA: Diagnosis not present

## 2022-08-18 DIAGNOSIS — R197 Diarrhea, unspecified: Secondary | ICD-10-CM

## 2022-08-18 DIAGNOSIS — Z5111 Encounter for antineoplastic chemotherapy: Secondary | ICD-10-CM | POA: Diagnosis not present

## 2022-08-18 DIAGNOSIS — E876 Hypokalemia: Secondary | ICD-10-CM

## 2022-08-18 DIAGNOSIS — C7801 Secondary malignant neoplasm of right lung: Secondary | ICD-10-CM | POA: Diagnosis not present

## 2022-08-18 DIAGNOSIS — E860 Dehydration: Secondary | ICD-10-CM

## 2022-08-18 LAB — CBC WITH DIFFERENTIAL (CANCER CENTER ONLY)
Abs Immature Granulocytes: 0.01 10*3/uL (ref 0.00–0.07)
Basophils Absolute: 0.1 10*3/uL (ref 0.0–0.1)
Basophils Relative: 1 %
Eosinophils Absolute: 0.1 10*3/uL (ref 0.0–0.5)
Eosinophils Relative: 3 %
HCT: 42.6 % (ref 39.0–52.0)
Hemoglobin: 14.6 g/dL (ref 13.0–17.0)
Immature Granulocytes: 0 %
Lymphocytes Relative: 37 %
Lymphs Abs: 1.7 10*3/uL (ref 0.7–4.0)
MCH: 33.6 pg (ref 26.0–34.0)
MCHC: 34.3 g/dL (ref 30.0–36.0)
MCV: 97.9 fL (ref 80.0–100.0)
Monocytes Absolute: 0.8 10*3/uL (ref 0.1–1.0)
Monocytes Relative: 18 %
Neutro Abs: 1.9 10*3/uL (ref 1.7–7.7)
Neutrophils Relative %: 41 %
Platelet Count: 169 10*3/uL (ref 150–400)
RBC: 4.35 MIL/uL (ref 4.22–5.81)
RDW: 16.8 % — ABNORMAL HIGH (ref 11.5–15.5)
WBC Count: 4.5 10*3/uL (ref 4.0–10.5)
nRBC: 0 % (ref 0.0–0.2)

## 2022-08-18 LAB — CMP (CANCER CENTER ONLY)
ALT: 13 U/L (ref 0–44)
AST: 17 U/L (ref 15–41)
Albumin: 3.3 g/dL — ABNORMAL LOW (ref 3.5–5.0)
Alkaline Phosphatase: 57 U/L (ref 38–126)
Anion gap: 6 (ref 5–15)
BUN: 7 mg/dL — ABNORMAL LOW (ref 8–23)
CO2: 30 mmol/L (ref 22–32)
Calcium: 8.4 mg/dL — ABNORMAL LOW (ref 8.9–10.3)
Chloride: 107 mmol/L (ref 98–111)
Creatinine: 0.78 mg/dL (ref 0.61–1.24)
GFR, Estimated: >60 mL/min (ref >60)
Glucose, Bld: 109 mg/dL — ABNORMAL HIGH (ref 70–99)
Potassium: 2.7 mmol/L — CL (ref 3.5–5.1)
Sodium: 143 mmol/L (ref 135–145)
Total Bilirubin: 0.6 mg/dL (ref 0.3–1.2)
Total Protein: 6.1 g/dL — ABNORMAL LOW (ref 6.5–8.1)

## 2022-08-18 LAB — TOTAL PROTEIN, URINE DIPSTICK: Protein, ur: <30 mg/dL — AB

## 2022-08-18 MED ORDER — HEPARIN SOD (PORK) LOCK FLUSH 100 UNIT/ML IV SOLN
500.0000 [IU] | Freq: Once | INTRAVENOUS | Status: AC | PRN
  Administered 2022-08-18: 500 [IU]

## 2022-08-18 MED ORDER — SODIUM CHLORIDE 0.9 % IV SOLN
7.5000 mg/kg | Freq: Once | INTRAVENOUS | Status: AC
  Administered 2022-08-18: 800 mg via INTRAVENOUS
  Filled 2022-08-18: qty 32

## 2022-08-18 MED ORDER — SODIUM CHLORIDE 0.9% FLUSH
10.0000 mL | Freq: Once | INTRAVENOUS | Status: AC
  Administered 2022-08-18: 10 mL

## 2022-08-18 MED ORDER — SODIUM CHLORIDE 0.9% FLUSH
10.0000 mL | INTRAVENOUS | Status: DC | PRN
  Administered 2022-08-18: 10 mL

## 2022-08-18 MED ORDER — SODIUM CHLORIDE 0.9 % IV SOLN: Freq: Once | INTRAVENOUS | Status: AC

## 2022-08-18 MED ORDER — POTASSIUM CHLORIDE CRYS ER 20 MEQ PO TBCR: 20.0000 meq | EXTENDED_RELEASE_TABLET | Freq: Two times a day (BID) | ORAL | 0 refills | Status: DC

## 2022-08-18 MED ORDER — POTASSIUM CHLORIDE 10 MEQ/100ML IV SOLN
10.0000 meq | INTRAVENOUS | Status: AC
  Administered 2022-08-18 (×2): 10 meq via INTRAVENOUS
  Filled 2022-08-18 (×2): qty 100

## 2022-08-18 NOTE — Telephone Encounter (Signed)
CRITICAL VALUE STICKER  CRITICAL VALUE: K 2.7  RECEIVER (on-site recipient of call): Maurine Simmering CMA  DATE & TIME NOTIFIED: 08/18/2022 0846  MESSENGER (representative from lab):  MD NOTIFIED: Dr. Burr Medico  TIME OF NOTIFICATION: [phone]  RESPONSE: made nurse and physician aware

## 2022-08-18 NOTE — Telephone Encounter (Signed)
Contacted patient to scheduled appointments. Left message with appointment details and a call back number if patient had any questions or could not accommodate the time we provided.   

## 2022-08-18 NOTE — Patient Instructions (Signed)
[facility]  Discharge Instructions: Thank you for choosing [facility] to provide your oncology and hematology care.   If you have a lab appointment with the [facility], please go directly to the [facility] and check in at the registration area.   Wear comfortable clothing and clothing appropriate for easy access to any Portacath or PICC line.   We strive to give you quality time with your provider. You may need to reschedule your appointment if you arrive late (15 or more minutes).  Arriving late affects you and other patients whose appointments are after yours.  Also, if you miss three or more appointments without notifying the office, you may be dismissed from the clinic at the provider's discretion.      For prescription refill requests, have your pharmacy contact our office and allow 72 hours for refills to be completed.    Today you received the following chemotherapy and/or immunotherapy agents Bevacizumab  Today you received the following Electrolyte supplements: Potassium   To help prevent nausea and vomiting after your treatment, we encourage you to take your nausea medication as directed.  BELOW ARE SYMPTOMS THAT SHOULD BE REPORTED IMMEDIATELY: *FEVER GREATER THAN 100.4 F (38 C) OR HIGHER *CHILLS OR SWEATING *NAUSEA AND VOMITING THAT IS NOT CONTROLLED WITH YOUR NAUSEA MEDICATION *UNUSUAL SHORTNESS OF BREATH *UNUSUAL BRUISING OR BLEEDING *URINARY PROBLEMS (pain or burning when urinating, or frequent urination) *BOWEL PROBLEMS (unusual diarrhea, constipation, pain near the anus) TENDERNESS IN MOUTH AND THROAT WITH OR WITHOUT PRESENCE OF ULCERS (sore throat, sores in mouth, or a toothache) UNUSUAL RASH, SWELLING OR PAIN  UNUSUAL VAGINAL DISCHARGE OR ITCHING   Items with * indicate a potential emergency and should be followed up as soon as possible or go to the Emergency Department if any problems should occur.  Please  show the CHEMOTHERAPY ALERT CARD or IMMUNOTHERAPY ALERT CARD at check-in to the Emergency Department and triage nurse.  Should you have questions after your visit or need to cancel or reschedule your appointment, please contact [facility]  Dept: [phone]  and follow the prompts.  Office hours are 8:00 a.m. to 4:30 p.m. Monday - Friday. Please note that voicemails left after 4:00 p.m. may not be returned until the following business day.  We are closed weekends and major holidays. You have access to a nurse at all times for urgent questions. Please call the main number to the clinic Dept: [phone] and follow the prompts.   For any non-urgent questions, you may also contact your provider using MyChart. We now offer e-Visits for anyone 77 and older to request care online for non-urgent symptoms. For details visit [email or web address].   Also download the MyChart app! Go to the app store, search "MyChart", open the app, select [facility], and log in with your MyChart username and password.  Potassium Chloride Injection What is this medication? POTASSIUM CHLORIDE (poe TASS i um KLOOR ide) prevents and treats low levels of potassium in your body. Potassium plays an important role in maintaining the health of your kidneys, heart, muscles, and nervous system. This medicine may be used for other purposes; ask your health care provider or pharmacist if you have questions. COMMON BRAND NAME(S): PROAMP What should I tell my care team before I take this medication? They need to know if you have any of these conditions: Addison disease Dehydration Diabetes (high blood sugar) Heart disease High levels of  potassium in the blood Irregular heartbeat or rhythm Kidney disease Large areas of burned skin An unusual or allergic reaction to potassium, other medications, foods, dyes, or preservatives Pregnant or trying to get pregnant Breast-feeding How should I  use this medication? This medication is injected into a vein. It is given in a hospital or clinic setting. Talk to your care team about the use of this medication in children. Special care may be needed. Overdosage: If you think you have taken too much of this medicine contact a poison control center or emergency room at once. NOTE: This medicine is only for you. Do not share this medicine with others. What if I miss a dose? This does not apply. This medication is not for regular use. What may interact with this medication? Do not take this medication with any of the following: Certain diuretics, such as spironolactone, triamterene Eplerenone Sodium polystyrene sulfonate This medication may also interact with the following: Certain medications for blood pressure or heart disease, such as lisinopril, losartan, quinapril, valsartan Medications that lower your chance of fighting infection, such as cyclosporine, tacrolimus NSAIDs, medications for pain and inflammation, such as ibuprofen or naproxen Other potassium supplements Salt substitutes This list may not describe all possible interactions. Give your health care provider a list of all the medicines, herbs, non-prescription drugs, or dietary supplements you use. Also tell them if you smoke, drink alcohol, or use illegal drugs. Some items may interact with your medicine. What should I watch for while using this medication? Visit your care team for regular checks on your progress. Tell your care team if your symptoms do not start to get better or if they get worse. You may need blood work while you are taking this medication. Avoid salt substitutes unless you are told otherwise by your care team. What side effects may I notice from receiving this medication? Side effects that you should report to your care team as soon as possible: Allergic reactions--skin rash, itching, hives, swelling of the face, lips, tongue, or throat High potassium  level--muscle weakness, fast or irregular heartbeat Side effects that usually do not require medical attention (report to your care team if they continue or are bothersome): Diarrhea Nausea Stomach pain Vomiting This list may not describe all possible side effects. Call your doctor for medical advice about side effects. You may report side effects to FDA at 1-800-FDA-1088. Where should I keep my medication? This medication is given in a hospital or clinic. It will not be stored at home. NOTE: This sheet is a summary. It may not cover all possible information. If you have questions about this medicine, talk to your doctor, pharmacist, or health care provider.  2023 Elsevier/Gold Standard (2020-09-25 00:00:00)

## 2022-08-24 NOTE — Assessment & Plan Note (Signed)
-  stage II (pT3, N0), metastatic disease to lung and possible nodes in July 2023, MSS, KRAS/NRAS/BRAF wild type   -found on screening colonoscopy. S/p right hemicolectomy 01/09/21. -lung metastasis confirmed in RUL by bronchoscopy on 02/09/22. -FoundationOne showed MSS, low tumor burden, KRAS/NRAS/BRAF wild-type, no other targetable mutations. The benefit of EGFR inhibitor is much less in right-sided colon cancer, so I will not give it as first line but may consider in subsequent lines.  -he started FOLFOX on 03/03/22. He tolerated well overall with diarrhea for several days. Bevacizumab added with C2 (9/20).  -CT scan 05/26/2022 showed improved lung mets, but showed new infiltrative lung change, possible related to his recent URI -repeated CT chest on 07/26/2022 showed stable known lung mets (overall excellent response, with minimum residual dsease) and resolved previously see inflammatory change in RLL -I have changed treatment to maintenance xeloda and beva every 3 weeks, he started C1 on 08/18/2022

## 2022-08-25 ENCOUNTER — Inpatient Hospital Stay (HOSPITAL_BASED_OUTPATIENT_CLINIC_OR_DEPARTMENT_OTHER): Payer: Medicare HMO | Admitting: Hematology

## 2022-08-25 ENCOUNTER — Encounter: Payer: Self-pay | Admitting: Hematology

## 2022-08-25 DIAGNOSIS — C182 Malignant neoplasm of ascending colon: Secondary | ICD-10-CM

## 2022-08-25 MED ORDER — POTASSIUM CHLORIDE CRYS ER 20 MEQ PO TBCR: 20.0000 meq | EXTENDED_RELEASE_TABLET | Freq: Every day | ORAL | 0 refills | Status: DC

## 2022-08-25 NOTE — Progress Notes (Signed)
[name]   [phone] [phone]   Clinic Follow up Note   Patient Care Team: Angelina Sheriff, MD as PCP - General (Family Medicine) Berniece Salines, DO as PCP - Cardiology (Cardiology) Truitt Merle, MD as Attending Physician (Hematology and Oncology) 08/25/2022  I connected with Thomas Knight on 08/25/22 at  8:00 AM EST by telephone and verified that I am speaking with the correct person using two identifiers.   I discussed the limitations, risks, security and privacy concerns of performing an evaluation and management service by telephone and the availability of in person appointments. I also discussed with the patient that there may be a patient responsible charge related to this service. The patient expressed understanding and agreed to proceed.   Patient's location:  Home  Provider's location:  Office    CHIEF COMPLAINT: chemo toxicity check up    CURRENT THERAPY:   ASSESSMENT & PLAN:  Cancer of right colon ([place]) -stage II (pT3, N0), metastatic disease to lung and possible nodes in July 2023, MSS, KRAS/NRAS/BRAF wild type   -found on screening colonoscopy. S/p right hemicolectomy 01/09/21. -lung metastasis confirmed in RUL by bronchoscopy on 02/09/22. -FoundationOne showed MSS, low tumor burden, KRAS/NRAS/BRAF wild-type, no other targetable mutations. The benefit of EGFR inhibitor is much less in right-sided colon cancer, so I will not give it as first line but may consider in subsequent lines.  -he started FOLFOX on 03/03/22. He tolerated well overall with diarrhea for several days. Bevacizumab added with C2 (9/20).  -CT scan 05/26/2022 showed improved lung mets, but showed new infiltrative lung change, possible related to his recent URI -repeated CT chest on 07/26/2022 showed stable known lung mets (overall excellent response, with minimum residual dsease) and resolved previously see inflammatory change in RLL -I have changed treatment to  maintenance xeloda and beva every 3 weeks, he started C1 on 08/18/2022 -he is tolerating Xeloda well overall, with a few episode of diarrhea   Hypokalemia -he finished KCL bid for a week, I will call in KCL 61mq daily for him   Plan -continue first cycle Xeloda  -f/u in 2 weeks as schedule before cycle 2 Xeloda and beva   SUMMARY OF ONCOLOGIC HISTORY: Oncology History Overview Note   Cancer Staging  Cancer of right colon ([facility] Staging form: Colon and Rectum, AJCC 8th Edition - Pathologic stage from 01/09/2021: Stage IIA (pT3, pN0, cM0) - Signed by FTruitt Merle MD on 02/12/2022 Total positive nodes: 0 Histologic grading system: 4 grade system Histologic grade (G): G2 Residual tumor (R): R0 - None     [place]cer of right colon ([place]  10/2020 Procedure   Colonoscopy-Dr. HBenson [place]    11/06/2020 Imaging   CT CHEST ABDOMEN PELVIS W CONTRAST   IMPRESSION: 1. 4.6 cm partially circumferential apple-core type neoplastic process involving the mid transverse colon. No findings for locoregional adenopathy Knight distant metastatic disease. 2. Enlarged prostate gland with median lobe hypertrophy impressing on the base of the bladder. 3. 16 mm left thyroid nodule. Recommend follow-up thyroid ultrasound examination.   01/09/2021 Initial Diagnosis   [place] cancer ([place]   01/09/2021 Pathology Results   B. COLON, RIGHT, RESECTION:  -  Adenocarcinoma, moderately differentiated, 3.0 cm  -  No carcinoma identified in seventeen lymph nodes (0/17)  -  Margins uninvolved by carcinoma  -  Tubular adenoma (x2)  -  Benign appendix  -  See oncology table and comment below   Margin Status for Invasive Carcinoma: All  margins negative for  invasive carcinoma       Distance from Invasive Carcinoma to Radial (Circumferential)       Margin Status for Non-Invasive Tumor: All margins negative for  high-grade dysplasia / intramucosal carcinoma and low-grade dysplasia  Regional Lymph Nodes:       Number of Lymph  Nodes with Tumor: 0       Number of Lymph Nodes Examined: 17  Tumor Deposits: 0  Distant Metastasis:       Distant Site(s) Involved: Not applicable  Pathologic Stage Classification (pTNM, AJCC 8th Edition): pT3, pN0   MMR Stable    01/09/2021 Cancer Staging   Staging form: Colon and Rectum, AJCC 8th Edition - Pathologic stage from 01/09/2021: Stage IIA (pT3, pN0, cM0) - Signed by Truitt Merle, MD on 02/12/2022 Total positive nodes: 0 Histologic grading system: 4 grade system Histologic grade (G): G2 Residual tumor (R): R0 - None   05/2021 Tumor Marker   Patient's tumor was tested for the following markers: CEA. Results of the tumor marker test revealed 2.1.   12/11/2021 Procedure   Colonoscopy-Dr. Benson [place]  There was evidence of a prior functional end-to-end ileo-colonic anastomosis in the transverse colon. This was patent and was characterized by healthy appearing mucosa. The anastomosis was traversed. Findings: Scattered small and large-mouthed diverticula were found in the sigmoid colon. - Patent functional end-to-end ileo-colonic anastomosis, characterized by healthy appearing mucosa. - Diverticulosis in the sigmoid colon. - No specimens collected.   01/25/2022 Imaging   CT CHEST ABDOMEN PELVIS W CONTRAST   IMPRESSION: 1. New right-sided pulmonary nodules measure up to 11 mm, nonspecific but concerning for metastatic disease. Consider further evaluation with nuclear medicine PET/CT. 2. New nodularity/lymph nodes in the central mesentery, concerning for nodal disease involvement. 3. Slightly increased size of prominent retroperitoneal lymph nodes, nonspecific but also somewhat concerning for nodal disease involvement. 4. Surgical change of partial colectomy without evidence of local recurrence. 5. Stable prominent mediastinal lymph nodes, nonspecific but stability is reassuring. Attention on follow-up imaging suggested. 6. Stable prominent right external iliac lymph node,  nonspecific but stability is reassuring. Attention on follow-up imaging suggested. 7. Stable hypodense 8 mm hepatic lesion technically too small to accurately characterize but stability is reassuring. Attention on follow-up imaging suggested. 8. 1.9 cm incidental left thyroid nodule. Recommend thyroid US. Reference: J Am Coll Radiol. 2015 Feb;12(2): 143-50 9.  Aortic Atherosclerosis (ICD10-I70.0).   02/09/2022 Procedure   Bronchoscopy under the care of Dr. Valeta Harms    02/09/2022 Pathology Results   FINAL MICROSCOPIC DIAGNOSIS:   A. LUNG, RUL, FINE NEEDLE ASPIRATION  BIOPSY FORCEPS:  Adenocarcinoma with morphologic features consistent with metastasis from a colorectal primary.  Please see comment.   Comment: The following immunostains are performed with appropriate controls :  TTF-1: Negative .  Napsin A: Negative .  CK7: Negative.  CK20: Positive.  CDX2: Positive .   The above immuno histochemical profile is supportive of the rendered diagnosis.    08/18/2022 -  Chemotherapy   Patient is on Treatment Plan : COLORECTAL Bevacizumab q14d     Colon cancer metastasized to lung [facility])  02/09/2022 Pathology Results   FINAL MICROSCOPIC DIAGNOSIS:   A. LUNG, RUL, FINE NEEDLE ASPIRATION  BIOPSY FORCEPS:  Adenocarcinoma with morphologic features consistent with metastasis from a colorectal primary.  Please see comment.   Comment: The following immunostains are performed with appropriate controls :  TTF-1: Negative .  Napsin A: Negative .  CK7: Negative.  CK20: Positive.  CDX2: Positive .   The above immuno histochemical profile is supportive of the rendered diagnosis.    02/12/2022 Initial Diagnosis   Colon cancer metastasized to lung ([place])   03/03/2022 - 07/31/2022 Chemotherapy   Patient is on Treatment Plan : COLORECTAL FOLFOX + Bevacizumab q14d     07/26/2022 Imaging    IMPRESSION: 1. Near-complete interval resolution of nodular and masslike areas of irregular consolidation,  likely resolving infection Knight organizing pneumonia/drug reaction. 2. Additional previously seen pulmonary nodules are stable, consistent with treated pulmonary metastatic disease. 3. Mild peribronchovascular reticular opacities which are most pronounced in the mid and lower lungs, similar to prior exam, progressed when compared with prior dated January 15, 2022. Findings can be seen in the setting of interstitial lung disease, pattern is most consistent with fibrotic NSIP. 4. Aortic Atherosclerosis (ICD10-I70.0).     INTERVAL HISTORY: Pt is scheduled for a phone visit for follow up. He is doing well overall, tolerating Xeloda well except a few episodes of diarrhea last week, no N/V, his appetite is better than before, taste change has not improved except sweats. No fever, chills Knight bleeding.    REVIEW OF SYSTEMS:   Constitutional: Denies fevers, chills Knight abnormal weight loss, (+) mild fatigue  Eyes: Denies blurriness of vision Ears, nose, mouth, throat, and face: Denies mucositis Knight sore throat, (+) taste change  Respiratory: Denies cough, dyspnea Knight wheezes Cardiovascular: Denies palpitation, chest discomfort Knight lower extremity swelling Gastrointestinal:  Denies nausea, heartburn Knight change in bowel habits Skin: Denies abnormal skin rashes Lymphatics: Denies new lymphadenopathy Knight easy bruising Neurological:Denies numbness, tingling Knight new weaknesses Behavioral/Psych: Mood is stable, no new changes  All other systems were reviewed with the patient and are negative.  MEDICAL HISTORY:  Past Medical History:  Diagnosis Date   Bilateral leg edema 08/24/2019   Cancer ([place])    skin ca, colon   Chronic cough    Chronic pain of left knee 12/01/2015   COVID-19    06/2019   Dysrhythmia    afib  was cardioverted   Essential hypertension 08/24/2019   Mixed hyperlipidemia 08/24/2019   Obesity (BMI 30-39.9) 08/24/2019   Persistent atrial fibrillation ([place]) 08/24/2019    SURGICAL  HISTORY: Past Surgical History:  Procedure Laterality Date   ACHILLES TENDON REPAIR Left 2012   Ruptured   BRONCHIAL BIOPSY  02/09/2022   Procedure: BRONCHIAL BIOPSIES;  Surgeon: Garner Nash, DO;  Location: [facility];  Service: Pulmonary;;   BRONCHIAL NEEDLE ASPIRATION BIOPSY  02/09/2022   Procedure: BRONCHIAL NEEDLE ASPIRATION BIOPSIES;  Surgeon: Garner Nash, DO;  Location: [facility];  Service: Pulmonary;;   COLONOSCOPY WITH PROPOFOL N/A 12/11/2021   Procedure: COLONOSCOPY WITH PROPOFOL;  Surgeon: Carol Ada, MD;  Location: [facility];  Service: Gastroenterology;  Laterality: N/A;   HERNIA REPAIR     umbilical hernia   IR IMAGING GUIDED PORT INSERTION  02/18/2022   SKIN SURGERY Left    Basal cell carcinoma on left forearm   XI ROBOTIC ASSISTED LOWER ANTERIOR RESECTION N/A 01/09/2021   Procedure: XI ROBOTIC ASSISTED RIGHT HEMI COLECTOMY, LYSIS OF ADHESIONS, SMALL BOWEL RESECTION, INTRAOPERATIVE ASSESMENT OF PERFUSION, PARTIAL OMENTECTOMY;  Surgeon: Ileana Roup, MD;  Location: [facility];  Service: General;  Laterality: N/A;    I have reviewed the social history and family history with the patient and they are unchanged from previous note.  ALLERGIES:  is allergic to furosemide and tramadol.  MEDICATIONS:  Current Outpatient Medications  Medication Sig Dispense  Refill   acetaminophen (TYLENOL) 500 MG tablet Take 500-1,000 mg by mouth every 6 (six) hours as needed (pain.).     amLODipine (NORVASC) 10 MG tablet Take 1 tablet (10 mg total) by mouth daily. 30 tablet 1   Ascorbic Acid (VITAMIN C WITH ROSE HIPS) 500 MG tablet Take 500 mg by mouth every evening.     Bacillus Coagulans-Inulin (ALIGN PREBIOTIC-PROBIOTIC PO) Take by mouth.     capecitabine (XELODA) 500 MG tablet Take 4 tablets (2000 mg total) by mouth 2 (two) times daily. Take within 30 minutes after meals. Take for 14 days, then off 7 days. Repeat every 21 days. 112 tablet 0   Cholecalciferol (D3-1000) 25  MCG (1000 UT) capsule Take 1,000 Units by mouth every evening.     diphenoxylate-atropine (LOMOTIL) 2.5-0.025 MG tablet Take 1 tablet by mouth 4 (four) times daily as needed for diarrhea Knight loose stools. 30 tablet 3   ELIQUIS 5 MG TABS tablet Take 1 tablet (5 mg total) by mouth 2 (two) times daily. 180 tablet 1   metoprolol tartrate (LOPRESSOR) 25 MG tablet Take 1 tablet (25 mg total) by mouth 2 (two) times daily. 180 tablet 3   Misc Natural Products (JOINT SUPPORT PO) Take 1 tablet by mouth daily. Instaflex Advanced Joint Support     nitroGLYCERIN (NITROSTAT) 0.4 MG SL tablet Place 1 tablet (0.4 mg total) under the tongue every 5 (five) minutes as needed. 25 tablet 7   sacubitril-valsartan (ENTRESTO) 24-26 MG Take 1 tablet by mouth 2 (two) times daily. 180 tablet 3   spironolactone (ALDACTONE) 50 MG tablet Take 1 tablet (50 mg total) by mouth daily. 10 tablet 0   vitamin B-12 (CYANOCOBALAMIN) 1000 MCG tablet Take 1,000 mcg by mouth every evening.     Zinc 50 MG CAPS Take 50 mg by mouth every evening.     No current facility-administered medications for this visit.    PHYSICAL EXAMINATION: Not performed   LABORATORY DATA:  I have reviewed the data as listed    Latest Ref Rng & Units 08/18/2022    7:48 AM 07/28/2022    9:14 AM 07/15/2022    8:10 AM  CBC  WBC 4.0 - 10.5 K/uL 4.5  5.6  4.9   Hemoglobin 13.0 - 17.0 g/dL 14.6  14.7  14.3   Hematocrit 39.0 - 52.0 % 42.6  42.5  41.3   Platelets 150 - 400 K/uL 169  114  129         Latest Ref Rng & Units 08/18/2022    7:48 AM 07/28/2022    9:14 AM 07/15/2022    8:10 AM  CMP  Glucose 70 - 99 mg/dL 109  94  114   BUN 8 - 23 mg/dL '7  8  7   '$ Creatinine 0.61 - 1.24 mg/dL 0.78  0.81  0.73   Sodium 135 - 145 mmol/L 143  142  142   Potassium 3.5 - 5.1 mmol/L 2.7  3.3  3.1   Chloride 98 - 111 mmol/L 107  108  109   CO2 22 - 32 mmol/L '30  29  28   '$ Calcium 8.9 - 10.3 mg/dL 8.4  8.9  8.6   Total Protein 6.5 - 8.1 g/dL 6.1  6.4  5.8   Total  Bilirubin 0.3 - 1.2 mg/dL 0.6  0.7  0.6   Alkaline Phos 38 - 126 U/L 57  54  55   AST 15 - 41 U/L 17  18  16   ALT 0 - 44 U/L '13  12  13       '$ RADIOGRAPHIC STUDIES: I have personally reviewed the radiological images as listed and agreed with the findings in the report. No results found.     I discussed the assessment and treatment plan with the patient. The patient was provided an opportunity to ask questions and all were answered. The patient agreed with the plan and demonstrated an understanding of the instructions.   The patient was advised to call back Knight seek an in-person evaluation if the symptoms worsen Knight if the condition fails to improve as anticipated.  I provided 15 minutes of non face-to-face telephone visit time during this encounter, and > 50% was spent counseling as documented under my assessment & plan.     Truitt Merle, MD 08/25/22

## 2022-08-26 ENCOUNTER — Other Ambulatory Visit: Payer: Self-pay | Admitting: Hematology

## 2022-08-26 ENCOUNTER — Other Ambulatory Visit: Payer: Self-pay

## 2022-08-26 ENCOUNTER — Other Ambulatory Visit (HOSPITAL_COMMUNITY): Payer: Self-pay

## 2022-08-26 DIAGNOSIS — C189 Malignant neoplasm of colon, unspecified: Secondary | ICD-10-CM

## 2022-08-26 MED ORDER — CAPECITABINE 500 MG PO TABS
ORAL_TABLET | ORAL | 0 refills | Status: DC
  Filled 2022-08-26: qty 112, 21d supply, fill #0

## 2022-08-27 ENCOUNTER — Other Ambulatory Visit (HOSPITAL_COMMUNITY): Payer: Self-pay

## 2022-09-01 ENCOUNTER — Other Ambulatory Visit: Payer: Self-pay | Admitting: Hematology and Oncology

## 2022-09-01 ENCOUNTER — Other Ambulatory Visit (HOSPITAL_COMMUNITY): Payer: Self-pay

## 2022-09-01 ENCOUNTER — Other Ambulatory Visit: Payer: Self-pay | Admitting: Hematology

## 2022-09-01 DIAGNOSIS — C189 Malignant neoplasm of colon, unspecified: Secondary | ICD-10-CM

## 2022-09-01 NOTE — Progress Notes (Signed)
ON PATHWAY REGIMEN - Colorectal  No Change  Continue With Treatment as Ordered.  Original Decision Date/Time: 02/16/2022 09:56     A cycle is every 14 days:     Bevacizumab-xxxx      Oxaliplatin      Leucovorin      Fluorouracil      Fluorouracil   **Always confirm dose/schedule in your pharmacy ordering system**  Patient Characteristics: Distant Metastases, Nonsurgical Candidate, KRAS/NRAS Mutation Positive/Unknown (BRAF V600 Wild-Type/Unknown), Standard Cytotoxic Therapy, First Line Standard Cytotoxic Therapy, Bevacizumab Eligible, PS = 0,1 Tumor Location: Colon Therapeutic Status: Distant Metastases Microsatellite/Mismatch Repair Status: Unknown BRAF Mutation Status: Awaiting Test Results KRAS/NRAS Mutation Status: Awaiting Test Results Standard Cytotoxic Line of Therapy: First Line Standard Cytotoxic Therapy ECOG Performance Status: 0 Bevacizumab Eligibility: Eligible Intent of Therapy: Non-Curative / Palliative Intent, Discussed with Patient

## 2022-09-07 NOTE — Assessment & Plan Note (Signed)
-  stage II (pT3, N0), metastatic disease to lung and possible nodes in July 2023, MSS, KRAS/NRAS/BRAF wild type   -found on screening colonoscopy. S/p right hemicolectomy 01/09/21. -lung metastasis confirmed in RUL by bronchoscopy on 02/09/22. -FoundationOne showed MSS, low tumor burden, KRAS/NRAS/BRAF wild-type, no other targetable mutations. The benefit of EGFR inhibitor is much less in right-sided colon cancer, so I will not give it as first line but may consider in subsequent lines.  -he started FOLFOX on 03/03/22. He tolerated well overall with diarrhea for several days. Bevacizumab added with C2 (9/20).  -CT scan 05/26/2022 showed improved lung mets, but showed new infiltrative lung change, possible related to his recent URI -repeated CT chest on 07/26/2022 showed stable known lung mets (overall excellent response, with minimum residual dsease) and resolved previously see inflammatory change in RLL -I have changed treatment to maintenance xeloda and beva every 3 weeks, he started C1 on 08/18/2022  

## 2022-09-08 ENCOUNTER — Inpatient Hospital Stay: Payer: Medicare HMO | Attending: Physician Assistant | Admitting: Hematology

## 2022-09-08 ENCOUNTER — Inpatient Hospital Stay: Payer: Medicare HMO

## 2022-09-08 ENCOUNTER — Telehealth: Payer: Self-pay | Admitting: Dietician

## 2022-09-08 ENCOUNTER — Encounter: Payer: Self-pay | Admitting: Hematology

## 2022-09-08 VITALS — BP 139/70 | HR 73 | Resp 18

## 2022-09-08 VITALS — BP 138/76 | HR 63 | Temp 98.2°F | Resp 18 | Ht 71.0 in | Wt 228.2 lb

## 2022-09-08 DIAGNOSIS — C7800 Secondary malignant neoplasm of unspecified lung: Secondary | ICD-10-CM | POA: Diagnosis not present

## 2022-09-08 DIAGNOSIS — E860 Dehydration: Secondary | ICD-10-CM

## 2022-09-08 DIAGNOSIS — Z5112 Encounter for antineoplastic immunotherapy: Secondary | ICD-10-CM | POA: Insufficient documentation

## 2022-09-08 DIAGNOSIS — C189 Malignant neoplasm of colon, unspecified: Secondary | ICD-10-CM

## 2022-09-08 DIAGNOSIS — C7801 Secondary malignant neoplasm of right lung: Secondary | ICD-10-CM | POA: Diagnosis not present

## 2022-09-08 DIAGNOSIS — C182 Malignant neoplasm of ascending colon: Secondary | ICD-10-CM

## 2022-09-08 DIAGNOSIS — C184 Malignant neoplasm of transverse colon: Secondary | ICD-10-CM | POA: Diagnosis present

## 2022-09-08 DIAGNOSIS — R197 Diarrhea, unspecified: Secondary | ICD-10-CM

## 2022-09-08 LAB — CBC WITH DIFFERENTIAL (CANCER CENTER ONLY)
Abs Immature Granulocytes: 0.01 10*3/uL (ref 0.00–0.07)
Basophils Absolute: 0 10*3/uL (ref 0.0–0.1)
Basophils Relative: 1 %
Eosinophils Absolute: 0.1 10*3/uL (ref 0.0–0.5)
Eosinophils Relative: 2 %
HCT: 40.1 % (ref 39.0–52.0)
Hemoglobin: 14.3 g/dL (ref 13.0–17.0)
Immature Granulocytes: 0 %
Lymphocytes Relative: 32 %
Lymphs Abs: 1.5 10*3/uL (ref 0.7–4.0)
MCH: 35.3 pg — ABNORMAL HIGH (ref 26.0–34.0)
MCHC: 35.7 g/dL (ref 30.0–36.0)
MCV: 99 fL (ref 80.0–100.0)
Monocytes Absolute: 0.8 10*3/uL (ref 0.1–1.0)
Monocytes Relative: 17 %
Neutro Abs: 2.3 10*3/uL (ref 1.7–7.7)
Neutrophils Relative %: 48 %
Platelet Count: 151 10*3/uL (ref 150–400)
RBC: 4.05 MIL/uL — ABNORMAL LOW (ref 4.22–5.81)
RDW: 17.6 % — ABNORMAL HIGH (ref 11.5–15.5)
WBC Count: 4.8 10*3/uL (ref 4.0–10.5)
nRBC: 0 % (ref 0.0–0.2)

## 2022-09-08 LAB — CMP (CANCER CENTER ONLY)
ALT: 16 U/L (ref 0–44)
AST: 25 U/L (ref 15–41)
Albumin: 3.4 g/dL — ABNORMAL LOW (ref 3.5–5.0)
Alkaline Phosphatase: 51 U/L (ref 38–126)
Anion gap: 6 (ref 5–15)
BUN: 6 mg/dL — ABNORMAL LOW (ref 8–23)
CO2: 29 mmol/L (ref 22–32)
Calcium: 8.4 mg/dL — ABNORMAL LOW (ref 8.9–10.3)
Chloride: 106 mmol/L (ref 98–111)
Creatinine: 0.73 mg/dL (ref 0.61–1.24)
GFR, Estimated: >60 mL/min (ref >60)
Glucose, Bld: 102 mg/dL — ABNORMAL HIGH (ref 70–99)
Potassium: 3 mmol/L — ABNORMAL LOW (ref 3.5–5.1)
Sodium: 141 mmol/L (ref 135–145)
Total Bilirubin: 0.7 mg/dL (ref 0.3–1.2)
Total Protein: 6.1 g/dL — ABNORMAL LOW (ref 6.5–8.1)

## 2022-09-08 LAB — TOTAL PROTEIN, URINE DIPSTICK: Protein, ur: NEGATIVE mg/dL

## 2022-09-08 MED ORDER — SODIUM CHLORIDE 0.9% FLUSH
10.0000 mL | INTRAVENOUS | Status: DC | PRN
  Administered 2022-09-08: 10 mL

## 2022-09-08 MED ORDER — SODIUM CHLORIDE 0.9 % IV SOLN: Freq: Once | INTRAVENOUS | Status: AC

## 2022-09-08 MED ORDER — HEPARIN SOD (PORK) LOCK FLUSH 100 UNIT/ML IV SOLN
500.0000 [IU] | Freq: Once | INTRAVENOUS | Status: AC | PRN
  Administered 2022-09-08: 500 [IU]

## 2022-09-08 MED ORDER — SODIUM CHLORIDE 0.9 % IV SOLN
7.5000 mg/kg | Freq: Once | INTRAVENOUS | Status: AC
  Administered 2022-09-08: 800 mg via INTRAVENOUS
  Filled 2022-09-08: qty 32

## 2022-09-08 MED ORDER — SODIUM CHLORIDE 0.9% FLUSH
10.0000 mL | Freq: Once | INTRAVENOUS | Status: AC
  Administered 2022-09-08: 10 mL

## 2022-09-08 NOTE — Progress Notes (Addendum)
[facility]   Telephone:(336) [phone] Fax:(336) [phone]   Clinic Follow up Note   Patient Care Team: Angelina Sheriff, MD as PCP - General (Family Medicine) Berniece Salines, DO as PCP - Cardiology (Cardiology) Truitt Merle, MD as Attending Physician (Hematology and Oncology)  Date of Service:  09/08/2022  CHIEF COMPLAINT: f/u of metastatic colon cancer    CURRENT THERAPY:  - Maintenance Xeloda 2000 mg q12h day 1-14 every 21 days  -Bevacizumab   ASSESSMENT:  Thomas Knight is a 77 y.o. male with   Cancer of right colon ([place]) -stage II (pT3, N0), metastatic disease to lung and possible nodes in July 2023, MSS, KRAS/NRAS/BRAF wild type   -found on screening colonoscopy. S/p right hemicolectomy 01/09/21. -lung metastasis confirmed in RUL by bronchoscopy on 02/09/22. -[facility] showed MSS, low tumor burden, KRAS/NRAS/BRAF wild-type, no other targetable mutations. The benefit of EGFR inhibitor is much less in right-sided colon cancer, so I will not give it as first line but may consider in subsequent lines.  -he started FOLFOX on 03/03/22. He tolerated well overall with diarrhea for several days. Bevacizumab added with C2 (9/20).  -CT scan 05/26/2022 showed improved lung mets, but showed new infiltrative lung change, possible related to his recent URI -repeated CT chest on 07/26/2022 showed stable known lung mets (overall excellent response, with minimum residual dsease) and resolved previously see inflammatory change in RLL -I have changed treatment to maintenance xeloda and beva every 3 weeks, he started C1 on 08/18/2022.  He tolerated first cycle Xeloda moderately well, with significant diarrhea in third week, which has resolved.  We again discussed the management of diarrhea. -Lab reviewed, adequate for treatment, will proceed bevacizumab today, he will start cycle 2 Xeloda today.  I will slightly reduce the dose to 3 tablets in the morning and 4 tablets in the evening, for 14  days. -If she is still has significant side effect from Xeloda, we will change back to 5-FU pump infusion every 2 weeks.   PLAN: -Will start cycle 2 Xeloda today, slightly reduced dose to 3 in the morning and 4 in th evening -proceed with C2 BEVA today  -f/u in 3 weeks   SUMMARY OF ONCOLOGIC HISTORY: Oncology History Overview Note   Cancer Staging  Cancer of right colon [facility]) Staging form: Colon and Rectum, AJCC 8th Edition - Pathologic stage from 01/09/2021: Stage IIA (pT3, pN0, cM0) - Signed by Truitt Merle, MD on 02/12/2022 Total positive nodes: 0 Histologic grading system: 4 grade system Histologic grade (G): G2 Residual tumor (R): R0 - None     Cancer of right colon ([place])  10/2020 Procedure   Colonoscopy-Dr. Benson Norway     11/06/2020 Imaging   CT CHEST ABDOMEN PELVIS W CONTRAST   IMPRESSION: 1. 4.6 cm partially circumferential apple-core type neoplastic process involving the mid transverse colon. No findings for locoregional adenopathy or distant metastatic disease. 2. Enlarged prostate gland with median lobe hypertrophy impressing on the base of the bladder. 3. 16 mm left thyroid nodule. Recommend follow-up thyroid ultrasound examination.   01/09/2021 Initial Diagnosis   Colon cancer ([place])   01/09/2021 Pathology Results   B. COLON, RIGHT, RESECTION:  -  Adenocarcinoma, moderately differentiated, 3.0 cm  -  No carcinoma identified in seventeen lymph nodes (0/17)  -  Margins uninvolved by carcinoma  -  Tubular adenoma (x2)  -  Benign appendix  -  See oncology table and comment below   Margin Status for Invasive Carcinoma: All margins  negative for  invasive carcinoma       Distance from Invasive Carcinoma to Radial (Circumferential)       Margin Status for Non-Invasive Tumor: All margins negative for  high-grade dysplasia / intramucosal carcinoma and low-grade dysplasia  Regional Lymph Nodes:       Number of Lymph Nodes with Tumor: 0       Number of Lymph Nodes  Examined: 17  Tumor Deposits: 0  Distant Metastasis:       Distant Site(s) Involved: Not applicable  Pathologic Stage Classification (pTNM, AJCC 8th Edition): pT3, pN0   MMR Stable    01/09/2021 Cancer Staging   Staging form: Colon and Rectum, AJCC 8th Edition - Pathologic stage from 01/09/2021: Stage IIA (pT3, pN0, cM0) - Signed by Truitt Merle, MD on 02/12/2022 Total positive nodes: 0 Histologic grading system: 4 grade system Histologic grade (G): G2 Residual tumor (R): R0 - None   05/2021 Tumor Marker   Patient's tumor was tested for the following markers: CEA. Results of the tumor marker test revealed 2.1.   12/11/2021 Procedure   Colonoscopy-Dr. Benson Norway  There was evidence of a prior functional end-to-end ileo-colonic anastomosis in the transverse colon. This was patent and was characterized by healthy appearing mucosa. The anastomosis was traversed. Findings: Scattered small and large-mouthed diverticula were found in the sigmoid colon. - Patent functional end-to-end ileo-colonic anastomosis, characterized by healthy appearing mucosa. - Diverticulosis in the sigmoid colon. - No specimens collected.   01/25/2022 Imaging   CT CHEST ABDOMEN PELVIS W CONTRAST   IMPRESSION: 1. New right-sided pulmonary nodules measure up to 11 mm, nonspecific but concerning for metastatic disease. Consider further evaluation with nuclear medicine PET/CT. 2. New nodularity/lymph nodes in the central mesentery, concerning for nodal disease involvement. 3. Slightly increased size of prominent retroperitoneal lymph nodes, nonspecific but also somewhat concerning for nodal disease involvement. 4. Surgical change of partial colectomy without evidence of local recurrence. 5. Stable prominent mediastinal lymph nodes, nonspecific but stability is reassuring. Attention on follow-up imaging suggested. 6. Stable prominent right external iliac lymph node, nonspecific but stability is reassuring. Attention  on follow-up imaging suggested. 7. Stable hypodense 8 mm hepatic lesion technically too small to accurately characterize but stability is reassuring. Attention on follow-up imaging suggested. 8. 1.9 cm incidental left thyroid nodule. Recommend thyroid US. Reference: J Am Coll Radiol. 2015 Feb;12(2): 143-50 9.  Aortic Atherosclerosis (ICD10-I70.0).   02/09/2022 Procedure   Bronchoscopy under the care of Dr. Valeta Harms    02/09/2022 Pathology Results   FINAL MICROSCOPIC DIAGNOSIS:   A. LUNG, RUL, FINE NEEDLE ASPIRATION  BIOPSY FORCEPS:  Adenocarcinoma with morphologic features consistent with metastasis from a colorectal primary.  Please see comment.   Comment: The following immunostains are performed with appropriate controls :  TTF-1: Negative .  Napsin A: Negative .  CK7: Negative.  CK20: Positive.  CDX2: Positive .   The above immuno histochemical profile is supportive of the rendered diagnosis.    08/18/2022 - 08/18/2022 Chemotherapy   Patient is on Treatment Plan : COLORECTAL Bevacizumab q14d     Colon cancer metastasized to lung [facility])  02/09/2022 Pathology Results   FINAL MICROSCOPIC DIAGNOSIS:   A. LUNG, RUL, FINE NEEDLE ASPIRATION  BIOPSY FORCEPS:  Adenocarcinoma with morphologic features consistent with metastasis from a colorectal primary.  Please see comment.   Comment: The following immunostains are performed with appropriate controls :  TTF-1: Negative .  Napsin A: Negative .  CK7: Negative.  CK20: Positive.  CDX2:  Positive .   The above immuno histochemical profile is supportive of the rendered diagnosis.    02/12/2022 Initial Diagnosis   Colon cancer metastasized to lung ([place])   03/03/2022 - 07/31/2022 Chemotherapy   Patient is on Treatment Plan : COLORECTAL FOLFOX + Bevacizumab q14d     07/26/2022 Imaging    IMPRESSION: 1. Near-complete interval resolution of nodular and masslike areas of irregular consolidation, likely resolving infection or  organizing pneumonia/drug reaction. 2. Additional previously seen pulmonary nodules are stable, consistent with treated pulmonary metastatic disease. 3. Mild peribronchovascular reticular opacities which are most pronounced in the mid and lower lungs, similar to prior exam, progressed when compared with prior dated January 15, 2022. Findings can be seen in the setting of interstitial lung disease, pattern is most consistent with fibrotic NSIP. 4. Aortic Atherosclerosis (ICD10-I70.0).   09/08/2022 -  Chemotherapy   Patient is on Treatment Plan : COLORECTAL Bevacizumab q21d        INTERVAL HISTORY:  Thomas Knight is here for a follow up of metastatic colon cancer. He was last seen by me on 2/21/20024 He presents to the clinic accompanied by wife. Pt state the he had diarrhea 13 time on Monday and has taken imodium and he has lomotil. Pt state he still doesn't have an appetite unless is sweet. Pt state that he has some fatigue.       All other systems were reviewed with the patient and are negative.  MEDICAL HISTORY:  Past Medical History:  Diagnosis Date   Bilateral leg edema 08/24/2019   Cancer ([place])    skin ca, colon   Chronic cough    Chronic pain of left knee 12/01/2015   COVID-19    06/2019   Dysrhythmia    afib  was cardioverted   Essential hypertension 08/24/2019   Mixed hyperlipidemia 08/24/2019   Obesity (BMI 30-39.9) 08/24/2019   Persistent atrial fibrillation ([place]) 08/24/2019    SURGICAL HISTORY: Past Surgical History:  Procedure Laterality Date   ACHILLES TENDON REPAIR Left 2012   Ruptured   BRONCHIAL BIOPSY  02/09/2022   Procedure: BRONCHIAL BIOPSIES;  Surgeon: Garner Nash, DO;  Location: [facility];  Service: Pulmonary;;   BRONCHIAL NEEDLE ASPIRATION BIOPSY  02/09/2022   Procedure: BRONCHIAL NEEDLE ASPIRATION BIOPSIES;  Surgeon: Garner Nash, DO;  Location: [facility];  Service: Pulmonary;;   COLONOSCOPY WITH PROPOFOL N/A 12/11/2021   Procedure:  COLONOSCOPY WITH PROPOFOL;  Surgeon: Carol Ada, MD;  Location: [facility];  Service: Gastroenterology;  Laterality: N/A;   HERNIA REPAIR     umbilical hernia   IR IMAGING GUIDED PORT INSERTION  02/18/2022   SKIN SURGERY Left    Basal cell carcinoma on left forearm   XI ROBOTIC ASSISTED LOWER ANTERIOR RESECTION N/A 01/09/2021   Procedure: XI ROBOTIC ASSISTED RIGHT HEMI COLECTOMY, LYSIS OF ADHESIONS, SMALL BOWEL RESECTION, INTRAOPERATIVE ASSESMENT OF PERFUSION, PARTIAL OMENTECTOMY;  Surgeon: Ileana Roup, MD;  Location: [facility];  Service: General;  Laterality: N/A;    I have reviewed the social history and family history with the patient and they are unchanged from previous note.  ALLERGIES:  is allergic to furosemide and tramadol.  MEDICATIONS:  Current Outpatient Medications  Medication Sig Dispense Refill   acetaminophen (TYLENOL) 500 MG tablet Take 500-1,000 mg by mouth every 6 (six) hours as needed (pain.).     amLODipine (NORVASC) 10 MG tablet Take 1 tablet (10 mg total) by mouth daily. 30 tablet 1   Ascorbic Acid (VITAMIN C  WITH ROSE HIPS) 500 MG tablet Take 500 mg by mouth every evening.     Bacillus Coagulans-Inulin (ALIGN PREBIOTIC-PROBIOTIC PO) Take by mouth.     capecitabine (XELODA) 500 MG tablet Take 4 tablets (2000 mg total) by mouth 2 (two) times daily. Take within 30 minutes after meals. Take for 14 days, then off 7 days. Repeat every 21 days. 112 tablet 0   Cholecalciferol (D3-1000) 25 MCG (1000 UT) capsule Take 1,000 Units by mouth every evening.     diphenoxylate-atropine (LOMOTIL) 2.5-0.025 MG tablet Take 1 tablet by mouth 4 (four) times daily as needed for diarrhea or loose stools. 30 tablet 3   ELIQUIS 5 MG TABS tablet Take 1 tablet (5 mg total) by mouth 2 (two) times daily. 180 tablet 1   metoprolol tartrate (LOPRESSOR) 25 MG tablet Take 1 tablet (25 mg total) by mouth 2 (two) times daily. 180 tablet 3   Misc Natural Products (JOINT SUPPORT PO) Take 1 tablet  by mouth daily. Instaflex Advanced Joint Support     nitroGLYCERIN (NITROSTAT) 0.4 MG SL tablet Place 1 tablet (0.4 mg total) under the tongue every 5 (five) minutes as needed. 25 tablet 7   potassium chloride SA (KLOR-CON M) 20 MEQ tablet Take 1 tablet (20 mEq total) by mouth daily. 30 tablet 0   sacubitril-valsartan (ENTRESTO) 24-26 MG Take 1 tablet by mouth 2 (two) times daily. 180 tablet 3   spironolactone (ALDACTONE) 50 MG tablet Take 1 tablet (50 mg total) by mouth daily. 10 tablet 0   vitamin B-12 (CYANOCOBALAMIN) 1000 MCG tablet Take 1,000 mcg by mouth every evening.     Zinc 50 MG CAPS Take 50 mg by mouth every evening.     No current facility-administered medications for this visit.   Facility-Administered Medications Ordered in Other Visits  Medication Dose Route Frequency Provider Last Rate Last Admin   sodium chloride flush (NS) 0.9 % injection 10 mL  10 mL Intracatheter PRN Truitt Merle, MD   10 mL at 09/08/22 1437    PHYSICAL EXAMINATION: ECOG PERFORMANCE STATUS: 1 - Symptomatic but completely ambulatory  Vitals:   09/08/22 1241  BP: 138/76  Pulse: 63  Resp: 18  Temp: 98.2 F (36.8 C)  SpO2: 99%   Wt Readings from Last 3 Encounters:  09/08/22 228 lb 3.2 oz (103.5 kg)  08/18/22 233 lb 14.4 oz (106.1 kg)  07/29/22 230 lb (104.3 kg)     GENERAL:alert, no distress and comfortable SKIN: skin color normal, no rashes or significant lesions EYES: normal, Conjunctiva are pink and non-injected, sclera clear  NEURO: alert & oriented x 3 with fluent speech  LABORATORY DATA:  I have reviewed the data as listed    Latest Ref Rng & Units 09/08/2022   12:15 PM 08/18/2022    7:48 AM 07/28/2022    9:14 AM  CBC  WBC 4.0 - 10.5 K/uL 4.8  4.5  5.6   Hemoglobin 13.0 - 17.0 g/dL 14.3  14.6  14.7   Hematocrit 39.0 - 52.0 % 40.1  42.6  42.5   Platelets 150 - 400 K/uL 151  169  114         Latest Ref Rng & Units 09/08/2022   12:15 PM 08/18/2022    7:48 AM 07/28/2022    9:14 AM   CMP  Glucose 70 - 99 mg/dL 102  109  94   BUN 8 - 23 mg/dL 6  7  8    Creatinine 0.61 - 1.24 mg/dL  0.73  0.78  0.81   Sodium 135 - 145 mmol/L 141  143  142   Potassium 3.5 - 5.1 mmol/L 3.0  2.7  3.3   Chloride 98 - 111 mmol/L 106  107  108   CO2 22 - 32 mmol/L 29  30  29    Calcium 8.9 - 10.3 mg/dL 8.4  8.4  8.9   Total Protein 6.5 - 8.1 g/dL 6.1  6.1  6.4   Total Bilirubin 0.3 - 1.2 mg/dL 0.7  0.6  0.7   Alkaline Phos 38 - 126 U/L 51  57  54   AST 15 - 41 U/L 25  17  18    ALT 0 - 44 U/L 16  13  12        RADIOGRAPHIC STUDIES: I have personally reviewed the radiological images as listed and agreed with the findings in the report. No results found.    No orders of the defined types were placed in this encounter.  All questions were answered. The patient knows to call the clinic with any problems, questions or concerns. No barriers to learning was detected. The total time spent in the appointment was 25 minutes.     Truitt Merle, MD 09/08/2022   Felicity Coyer, CMA, am acting as scribe for Truitt Merle, MD.   I have reviewed the above documentation for accuracy and completeness, and I agree with the above.

## 2022-09-08 NOTE — Telephone Encounter (Signed)
Per 3/13 IB reached out to patient to schedule, left voicemail.

## 2022-09-08 NOTE — Patient Instructions (Signed)
[facility]  Discharge Instructions: Thank you for choosing [facility] to provide your oncology and hematology care.   If you have a lab appointment with the [facility], please go directly to the [facility] and check in at the registration area.   Wear comfortable clothing and clothing appropriate for easy access to any Portacath or PICC line.   We strive to give you quality time with your provider. You may need to reschedule your appointment if you arrive late (15 or more minutes).  Arriving late affects you and other patients whose appointments are after yours.  Also, if you miss three or more appointments without notifying the office, you may be dismissed from the clinic at the provider's discretion.      For prescription refill requests, have your pharmacy contact our office and allow 72 hours for refills to be completed.    Today you received the following chemotherapy and/or immunotherapy agents: Bevacizumab-awwb.      To help prevent nausea and vomiting after your treatment, we encourage you to take your nausea medication as directed.  BELOW ARE SYMPTOMS THAT SHOULD BE REPORTED IMMEDIATELY: *FEVER GREATER THAN 100.4 F (38 C) OR HIGHER *CHILLS OR SWEATING *NAUSEA AND VOMITING THAT IS NOT CONTROLLED WITH YOUR NAUSEA MEDICATION *UNUSUAL SHORTNESS OF BREATH *UNUSUAL BRUISING OR BLEEDING *URINARY PROBLEMS (pain or burning when urinating, or frequent urination) *BOWEL PROBLEMS (unusual diarrhea, constipation, pain near the anus) TENDERNESS IN MOUTH AND THROAT WITH OR WITHOUT PRESENCE OF ULCERS (sore throat, sores in mouth, or a toothache) UNUSUAL RASH, SWELLING OR PAIN  UNUSUAL VAGINAL DISCHARGE OR ITCHING   Items with * indicate a potential emergency and should be followed up as soon as possible or go to the Emergency Department if any problems should occur.  Please show the CHEMOTHERAPY ALERT CARD or IMMUNOTHERAPY ALERT CARD  at check-in to the Emergency Department and triage nurse.  Should you have questions after your visit or need to cancel or reschedule your appointment, please contact [facility]  Dept: [phone]  and follow the prompts.  Office hours are 8:00 a.m. to 4:30 p.m. Monday - Friday. Please note that voicemails left after 4:00 p.m. may not be returned until the following business day.  We are closed weekends and major holidays. You have access to a nurse at all times for urgent questions. Please call the main number to the clinic Dept: [phone] and follow the prompts.   For any non-urgent questions, you may also contact your provider using MyChart. We now offer e-Visits for anyone 9 and older to request care online for non-urgent symptoms. For details visit [email or web address].   Also download the MyChart app! Go to the app store, search "MyChart", open the app, select [facility], and log in with your MyChart username and password.

## 2022-09-13 ENCOUNTER — Ambulatory Visit (HOSPITAL_COMMUNITY): Payer: Medicare HMO | Attending: Internal Medicine

## 2022-09-13 ENCOUNTER — Other Ambulatory Visit: Payer: Self-pay

## 2022-09-13 ENCOUNTER — Telehealth: Payer: Self-pay

## 2022-09-13 DIAGNOSIS — I480 Paroxysmal atrial fibrillation: Secondary | ICD-10-CM | POA: Insufficient documentation

## 2022-09-13 LAB — ECHOCARDIOGRAM COMPLETE
Calc EF: 57.2 %
S' Lateral: 3.2 cm
Single Plane A2C EF: 56.5 %
Single Plane A4C EF: 59.6 %

## 2022-09-13 NOTE — Telephone Encounter (Addendum)
Called patient and relayed message below. Patient voiced full understanding will start two a day today.  ----- Message from Truitt Merle, MD sent at 09/13/2022  7:49 AM EDT ----- Please let pt know his K has been low, and increase oral KCL to twice daily, thx   Truitt Merle

## 2022-09-14 ENCOUNTER — Inpatient Hospital Stay: Payer: Medicare HMO | Admitting: Dietician

## 2022-09-14 NOTE — Progress Notes (Signed)
Nutrition Assessment   Reason for Assessment: Nutrition Impact Symptoms   ASSESSMENT: 77 year old male with recurrent colon cancer metastatic to lung. He is receiving Bevacizumab q21d. Xeloda dose reduced due to poor tolerance.   Past medical history includes atrial fibrillation, HTN, PAF, chronic dCHF, OSA, HLD  Met with patient and wife in office. Patient reports ongoing frequent episodes of diarrhea. He is taking imodium as directed by MD. Patient recalls 7 episodes of watery diarrhea yesterday. Nothing taste good. He does not feel like eating much anymore. Patient recalls tolerating mostly eggs, rice, and toast. He usually eats twice a day. Patient denies nausea, vomiting, constipation. Patient and wife have lots of questions regarding foods patient should or should not be consuming.   Nutrition Focused Physical Exam: deferred  Medications: norvasc, vit C, D3, eliquis, lopressor, klor-con, entresto, B12, zinc   Labs: 3/13 - K 3.0, BUN 102, albumin 3.4   Anthropometrics: Weights have decreased 5 lbs (2%) from 233 lb 14.4 oz on 2/21  Height: 5'11" Weight: 228 lb 3.2 oz UBW: 245-250 lb (September 2023) BMI: 31.83    NUTRITION DIAGNOSIS: Unintended weight loss related to side effects of therapy as evidenced by 2% wt loss in 21 days - concerning    INTERVENTION:  Discussed strategies for diarrhea, foods best tolerated and foods to limit/avoid. Suggested smaller more frequent meals vs 2 larger portions. Encouraged lying down for 20-30 minutes following intake - handout with tips provided Discussed importance of hydration, recommend one cup fluid following bout of diarrhea Samples of banatrol, Pedialyte hydration packets, enterade provided along with dosing instructions Suggested ONS as tolerated for added calories and protein - samples of Boost and Ensure given for pt to try All nutrition questions answered   Contact information given   MONITORING, EVALUATION, GOAL: Patient  will tolerate adequate calories and protein to minimize further weight loss    Next Visit: Wednesday April 24 during infusion

## 2022-09-21 ENCOUNTER — Other Ambulatory Visit (HOSPITAL_COMMUNITY): Payer: Self-pay

## 2022-09-24 ENCOUNTER — Other Ambulatory Visit (HOSPITAL_COMMUNITY): Payer: Self-pay

## 2022-09-24 ENCOUNTER — Other Ambulatory Visit: Payer: Self-pay

## 2022-09-24 ENCOUNTER — Other Ambulatory Visit: Payer: Self-pay | Admitting: Hematology

## 2022-09-24 DIAGNOSIS — C189 Malignant neoplasm of colon, unspecified: Secondary | ICD-10-CM

## 2022-09-24 MED ORDER — CAPECITABINE 500 MG PO TABS
ORAL_TABLET | ORAL | 0 refills | Status: DC
  Filled 2022-09-24: qty 112, 21d supply, fill #0

## 2022-09-26 DIAGNOSIS — I2510 Atherosclerotic heart disease of native coronary artery without angina pectoris: Secondary | ICD-10-CM | POA: Diagnosis not present

## 2022-09-26 DIAGNOSIS — I10 Essential (primary) hypertension: Secondary | ICD-10-CM | POA: Diagnosis not present

## 2022-09-26 DIAGNOSIS — E7800 Pure hypercholesterolemia, unspecified: Secondary | ICD-10-CM | POA: Diagnosis not present

## 2022-09-26 DIAGNOSIS — I4891 Unspecified atrial fibrillation: Secondary | ICD-10-CM | POA: Diagnosis not present

## 2022-09-27 NOTE — Progress Notes (Unsigned)
[place]   [phone] [phone]   Clinic Follow up Note   Patient Care Team: Angelina Sheriff, MD as PCP - General (Family Medicine) Berniece Salines, DO as PCP - Cardiology (Cardiology) Truitt Merle, MD as Attending Physician (Hematology and Oncology)  Date of Service:  09/29/2022  CHIEF COMPLAINT: f/u of  metastatic colon cancer    CURRENT THERAPY:  - Maintenance Xeloda, will change back to 5-fu pump infusion in 3 weeks  -Bevacizumab   ASSESSMENT:  Thomas Knight is a 77 y.o. male with   Cancer of right colon ([place]) -stage II (pT3, N0), metastatic disease to lung and possible nodes in July 2023, MSS, KRAS/NRAS/BRAF wild type   -found on screening colonoscopy. S/p right hemicolectomy 01/09/21. -lung metastasis confirmed in RUL by bronchoscopy on 02/09/22. -[facility] showed MSS, low tumor burden, KRAS/NRAS/BRAF wild-type, no other targetable mutations. The benefit of EGFR inhibitor is much less in right-sided colon cancer, so I will not give it as first line but may consider in subsequent lines.  -he started FOLFOX on 03/03/22. He tolerated well overall with diarrhea for several days. Bevacizumab added with C2 (9/20).  -CT scan 05/26/2022 showed improved lung mets, but showed new infiltrative lung change, possible related to his recent URI -repeated CT chest on 07/26/2022 showed stable known lung mets (overall excellent response, with minimum residual dsease) and resolved previously see inflammatory change in RLL -I have changed treatment to maintenance xeloda and beva every 3 weeks, he started C1 on 08/18/2022.  -due to diarrhea, Xeloda dose reduced for cycle 2  -He has persistent diarrhea, and developed worsening anorexia, lost 12 pounds since last visit 3 weeks ago.  He had a fall at home a few days ago due to the fatigue and weakness.  I will hold chemotherapy today. -I recommended change Xeloda back to 5-FU pump infusion every 2 weeks, due to his  poor tolerance to Xeloda.  He agrees. -Return in 3 weeks to restart chemo if he recovers well.  Diarrhea, anorexia and weight loss -Secondary to chemotherapy Xeloda -We discussed increased Lomotil, and continue Imodium as needed -Hopefully his anorexia will improve with chemo holiday -We discussed nutrition supplement, he will increase Ensure and boost to 3-4 bottles a day, and have high-calorie and high-protein diet.  He was seen by dietitian last time, and he will call if needed.  Hypokalemia -Potassium 2.5 today, secondary to diarrhea -Will give IV potassium 20 mill equal today, increase oral potassium from 2 tablets a day to 4 tablets a day for a week, then back to 2 tablets daily     PLAN: -deferred treatment for 3 weeks due to significant side effects. - Encourage pt to eat high calorie and high protein diet. - Cancel treatment today, will give IV potassium 20 mill equal today and 500 normal saline -Change treatment to 5-FU pump every 2 weeks, and continue bevacizumab, starting in 3 weeks -lab/flush and f/u in 3 weeks  SUMMARY OF ONCOLOGIC HISTORY: Oncology History Overview Note   Cancer Staging  Cancer of right colon [facility]) Staging form: Colon and Rectum, AJCC 8th Edition - Pathologic stage from 01/09/2021: Stage IIA (pT3, pN0, cM0) - Signed by Truitt Merle, MD on 02/12/2022 Total positive nodes: 0 Histologic grading system: 4 grade system Histologic grade (G): G2 Residual tumor (R): R0 - None     Cancer of right colon  10/2020 Procedure   Colonoscopy-Dr. Benson [place]     11/06/2020 Imaging   CT CHEST  ABDOMEN PELVIS W CONTRAST   IMPRESSION: 1. 4.6 cm partially circumferential apple-core type neoplastic process involving the mid transverse colon. No findings for locoregional adenopathy or distant metastatic disease. 2. Enlarged prostate gland with median lobe hypertrophy impressing on the base of the bladder. 3. 16 mm left thyroid nodule. Recommend follow-up thyroid  ultrasound examination.   01/09/2021 Initial Diagnosis   Colon cancer ([place])   01/09/2021 Pathology Results   B. COLON, RIGHT, RESECTION:  -  Adenocarcinoma, moderately differentiated, 3.0 cm  -  No carcinoma identified in seventeen lymph nodes (0/17)  -  Margins uninvolved by carcinoma  -  Tubular adenoma (x2)  -  Benign appendix  -  See oncology table and comment below   Margin Status for Invasive Carcinoma: All margins negative for  invasive carcinoma       Distance from Invasive Carcinoma to Radial (Circumferential)       Margin Status for Non-Invasive Tumor: All margins negative for  high-grade dysplasia / intramucosal carcinoma and low-grade dysplasia  Regional Lymph Nodes:       Number of Lymph Nodes with Tumor: 0       Number of Lymph Nodes Examined: 17  Tumor Deposits: 0  Distant Metastasis:       Distant Site(s) Involved: Not applicable  Pathologic Stage Classification (pTNM, AJCC 8th Edition): pT3, pN0   MMR Stable    01/09/2021 Cancer Staging   Staging form: Colon and Rectum, AJCC 8th Edition - Pathologic stage from 01/09/2021: Stage IIA (pT3, pN0, cM0) - Signed by Truitt Merle, MD on 02/12/2022 Total positive nodes: 0 Histologic grading system: 4 grade system Histologic grade (G): G2 Residual tumor (R): R0 - None   05/2021 Tumor Marker   Patient's tumor was tested for the following markers: CEA. Results of the tumor marker test revealed 2.1.   12/11/2021 Procedure   Colonoscopy-Dr. Benson [place]  There was evidence of a prior functional end-to-end ileo-colonic anastomosis in the transverse colon. This was patent and was characterized by healthy appearing mucosa. The anastomosis was traversed. Findings: Scattered small and large-mouthed diverticula were found in the sigmoid colon. - Patent functional end-to-end ileo-colonic anastomosis, characterized by healthy appearing mucosa. - Diverticulosis in the sigmoid colon. - No specimens collected.   01/25/2022 Imaging   CT  CHEST ABDOMEN PELVIS W CONTRAST   IMPRESSION: 1. New right-sided pulmonary nodules measure up to 11 mm, nonspecific but concerning for metastatic disease. Consider further evaluation with nuclear medicine PET/CT. 2. New nodularity/lymph nodes in the central mesentery, concerning for nodal disease involvement. 3. Slightly increased size of prominent retroperitoneal lymph nodes, nonspecific but also somewhat concerning for nodal disease involvement. 4. Surgical change of partial colectomy without evidence of local recurrence. 5. Stable prominent mediastinal lymph nodes, nonspecific but stability is reassuring. Attention on follow-up imaging suggested. 6. Stable prominent right external iliac lymph node, nonspecific but stability is reassuring. Attention on follow-up imaging suggested. 7. Stable hypodense 8 mm hepatic lesion technically too small to accurately characterize but stability is reassuring. Attention on follow-up imaging suggested. 8. 1.9 cm incidental left thyroid nodule. Recommend thyroid US. Reference: J Am Coll Radiol. 2015 Feb;12(2): 143-50 9.  Aortic Atherosclerosis (ICD10-I70.0).   02/09/2022 Procedure   Bronchoscopy under the care of Dr. Valeta Harms    02/09/2022 Pathology Results   FINAL MICROSCOPIC DIAGNOSIS:   A. LUNG, RUL, FINE NEEDLE ASPIRATION  BIOPSY FORCEPS:  Adenocarcinoma with morphologic features consistent with metastasis from a colorectal primary.  Please see comment.   Comment: The following  immunostains are performed with appropriate controls :  TTF-1: Negative .  Napsin A: Negative .  CK7: Negative.  CK20: Positive.  CDX2: Positive .   The above immuno histochemical profile is supportive of the rendered diagnosis.    08/18/2022 - 08/18/2022 Chemotherapy   Patient is on Treatment Plan : COLORECTAL Bevacizumab q14d     Colon cancer metastasized to lung  02/09/2022 Pathology Results   FINAL MICROSCOPIC DIAGNOSIS:   A. LUNG, RUL, FINE NEEDLE  ASPIRATION  BIOPSY FORCEPS:  Adenocarcinoma with morphologic features consistent with metastasis from a colorectal primary.  Please see comment.   Comment: The following immunostains are performed with appropriate controls :  TTF-1: Negative .  Napsin A: Negative .  CK7: Negative.  CK20: Positive.  CDX2: Positive .   The above immuno histochemical profile is supportive of the rendered diagnosis.    02/12/2022 Initial Diagnosis   Colon cancer metastasized to lung ([place])   03/03/2022 - 07/31/2022 Chemotherapy   Patient is on Treatment Plan : COLORECTAL FOLFOX + Bevacizumab q14d     07/26/2022 Imaging    IMPRESSION: 1. Near-complete interval resolution of nodular and masslike areas of irregular consolidation, likely resolving infection or organizing pneumonia/drug reaction. 2. Additional previously seen pulmonary nodules are stable, consistent with treated pulmonary metastatic disease. 3. Mild peribronchovascular reticular opacities which are most pronounced in the mid and lower lungs, similar to prior exam, progressed when compared with prior dated January 15, 2022. Findings can be seen in the setting of interstitial lung disease, pattern is most consistent with fibrotic NSIP. 4. Aortic Atherosclerosis (ICD10-I70.0).   09/08/2022 - 09/08/2022 Chemotherapy   Patient is on Treatment Plan : COLORECTAL Bevacizumab q21d     10/20/2022 -  Chemotherapy   Patient is on Treatment Plan : COLORECTAL 5FU + Leucovorin (Modified [place]) + Bevacizumab q14d        INTERVAL HISTORY:  [facility] is here for a follow up of  metastatic colon cancer  . He was last seen by me on 09/08/2022. He presents to the clinic accompanied by wife. Pt stated that he fainted Saturday night when stepping out the shower. Pt stated that he has no appetite and some nausea, but no vomiting. Pt reports having diarrhea q2 hrs. Pt stated on Monday he had 11 episode of diarrhea and has been taking imodium. Pt state that he is  always fatigue. Pt stated that he had to force himself to eat.       All other systems were reviewed with the patient and are negative.  MEDICAL HISTORY:  Past Medical History:  Diagnosis Date   Bilateral leg edema 08/24/2019   Cancer    skin ca, colon   Chronic cough    Chronic pain of left knee 12/01/2015   COVID-19    06/2019   Dysrhythmia    afib  was cardioverted   Essential hypertension 08/24/2019   Mixed hyperlipidemia 08/24/2019   Obesity (BMI 30-39.9) 08/24/2019   Persistent atrial fibrillation 08/24/2019    SURGICAL HISTORY: Past Surgical History:  Procedure Laterality Date   ACHILLES TENDON REPAIR Left 2012   Ruptured   BRONCHIAL BIOPSY  02/09/2022   Procedure: BRONCHIAL BIOPSIES;  Surgeon: Garner Nash, DO;  Location: [facility];  Service: Pulmonary;;   BRONCHIAL NEEDLE ASPIRATION BIOPSY  02/09/2022   Procedure: BRONCHIAL NEEDLE ASPIRATION BIOPSIES;  Surgeon: Garner Nash, DO;  Location: [facility];  Service: Pulmonary;;   COLONOSCOPY WITH PROPOFOL N/A 12/11/2021   Procedure: COLONOSCOPY WITH  PROPOFOL;  Surgeon: Carol Ada, MD;  Location: [facility];  Service: Gastroenterology;  Laterality: N/A;   HERNIA REPAIR     umbilical hernia   IR IMAGING GUIDED PORT INSERTION  02/18/2022   SKIN SURGERY Left    Basal cell carcinoma on left forearm   XI ROBOTIC ASSISTED LOWER ANTERIOR RESECTION N/A 01/09/2021   Procedure: XI ROBOTIC ASSISTED RIGHT HEMI COLECTOMY, LYSIS OF ADHESIONS, SMALL BOWEL RESECTION, INTRAOPERATIVE ASSESMENT OF PERFUSION, PARTIAL OMENTECTOMY;  Surgeon: Ileana Roup, MD;  Location: [facility];  Service: General;  Laterality: N/A;    I have reviewed the social history and family history with the patient and they are unchanged from previous note.  ALLERGIES:  is allergic to furosemide and tramadol.  MEDICATIONS:  Current Outpatient Medications  Medication Sig Dispense Refill   acetaminophen (TYLENOL) 500 MG tablet Take 500-1,000  mg by mouth every 6 (six) hours as needed (pain.).     amLODipine (NORVASC) 10 MG tablet Take 1 tablet (10 mg total) by mouth daily. 30 tablet 1   Ascorbic Acid (VITAMIN C WITH ROSE HIPS) 500 MG tablet Take 500 mg by mouth every evening.     Bacillus Coagulans-Inulin (ALIGN PREBIOTIC-PROBIOTIC PO) Take by mouth.     capecitabine (XELODA) 500 MG tablet Take 4 tablets (2000 mg total) by mouth 2 (two) times daily. Take within 30 minutes after meals. Take for 14 days, then off 7 days. Repeat every 21 days. 112 tablet 0   Cholecalciferol (D3-1000) 25 MCG (1000 UT) capsule Take 1,000 Units by mouth every evening.     diphenoxylate-atropine (LOMOTIL) 2.5-0.025 MG tablet Take 1 tablet by mouth 4 (four) times daily as needed for diarrhea or loose stools. 30 tablet 3   ELIQUIS 5 MG TABS tablet Take 1 tablet (5 mg total) by mouth 2 (two) times daily. 180 tablet 1   metoprolol tartrate (LOPRESSOR) 25 MG tablet Take 1 tablet (25 mg total) by mouth 2 (two) times daily. 180 tablet 3   Misc Natural Products (JOINT SUPPORT PO) Take 1 tablet by mouth daily. Instaflex Advanced Joint Support     nitroGLYCERIN (NITROSTAT) 0.4 MG SL tablet Place 1 tablet (0.4 mg total) under the tongue every 5 (five) minutes as needed. 25 tablet 7   potassium chloride SA (KLOR-CON M) 20 MEQ tablet Take 1 tablet (20 mEq total) by mouth daily. 30 tablet 1   sacubitril-valsartan (ENTRESTO) 24-26 MG Take 1 tablet by mouth 2 (two) times daily. 180 tablet 3   spironolactone (ALDACTONE) 50 MG tablet Take 1 tablet (50 mg total) by mouth daily. 10 tablet 0   vitamin B-12 (CYANOCOBALAMIN) 1000 MCG tablet Take 1,000 mcg by mouth every evening.     Zinc 50 MG CAPS Take 50 mg by mouth every evening.     No current facility-administered medications for this visit.   Facility-Administered Medications Ordered in Other Visits  Medication Dose Route Frequency Provider Last Rate Last Admin   0.9 %  sodium chloride infusion   Intravenous Continuous  Truitt Merle, MD       potassium chloride 10 mEq in 100 mL IVPB  10 mEq Intravenous Q1 Hr x 2 Truitt Merle, MD        PHYSICAL EXAMINATION: ECOG PERFORMANCE STATUS: 3 - Symptomatic, >50% confined to bed  Vitals:   09/29/22 0832  BP: 109/65  Pulse: 76  Resp: 17  Temp: 97.9 F (36.6 C)  SpO2: 98%   Wt Readings from Last 3 Encounters:  09/29/22 216  lb (98 kg)  09/08/22 228 lb 3.2 oz (103.5 kg)  08/18/22 233 lb 14.4 oz (106.1 kg)     GENERAL:alert, no distress and comfortable SKIN: skin color normal, no rashes or significant lesions EYES: normal, Conjunctiva are pink and non-injected, sclera clear  NEURO: alert & oriented x 3 with fluent speech   LABORATORY DATA:  I have reviewed the data as listed    Latest Ref Rng & Units 09/29/2022    8:13 AM 09/08/2022   12:15 PM 08/18/2022    7:48 AM  CBC  WBC 4.0 - 10.5 K/uL 4.5  4.8  4.5   Hemoglobin 13.0 - 17.0 g/dL 15.5  14.3  14.6   Hematocrit 39.0 - 52.0 % 42.9  40.1  42.6   Platelets 150 - 400 K/uL 154  151  169         Latest Ref Rng & Units 09/29/2022    8:13 AM 09/08/2022   12:15 PM 08/18/2022    7:48 AM  CMP  Glucose 70 - 99 mg/dL 103  102  109   BUN 8 - 23 mg/dL 31  6  7    Creatinine 0.61 - 1.24 mg/dL 1.12  0.73  0.78   Sodium 135 - 145 mmol/L 131  141  143   Potassium 3.5 - 5.1 mmol/L 2.5  3.0  2.7   Chloride 98 - 111 mmol/L 96  106  107   CO2 22 - 32 mmol/L 24  29  30    Calcium 8.9 - 10.3 mg/dL 8.5  8.4  8.4   Total Protein 6.5 - 8.1 g/dL 6.3  6.1  6.1   Total Bilirubin 0.3 - 1.2 mg/dL 1.1  0.7  0.6   Alkaline Phos 38 - 126 U/L 46  51  57   AST 15 - 41 U/L 26  25  17    ALT 0 - 44 U/L 15  16  13        RADIOGRAPHIC STUDIES: I have personally reviewed the radiological images as listed and agreed with the findings in the report. No results found.    Orders Placed This Encounter  Procedures   CBC with Differential ([place] Only)    Standing Status:   Future    Standing Expiration Date:   10/20/2023   CMP  ([place] only)    Standing Status:   Future    Standing Expiration Date:   10/20/2023   Total Protein, Urine dipstick    Standing Status:   Future    Standing Expiration Date:   10/20/2023   CBC with Differential ([place] Only)    Standing Status:   Future    Standing Expiration Date:   11/03/2023   CMP ([place] only)    Standing Status:   Future    Standing Expiration Date:   11/03/2023   CBC with Differential ([place] Only)    Standing Status:   Future    Standing Expiration Date:   11/17/2023   CMP ([place] only)    Standing Status:   Future    Standing Expiration Date:   11/17/2023   Total Protein, Urine dipstick    Standing Status:   Future    Standing Expiration Date:   11/17/2023   All questions were answered. The patient knows to call the clinic with any problems, questions or concerns. No barriers to learning was detected. The total time spent in the appointment was 40 minutes.     Krista Blue  Burr Medico, MD 09/29/2022   Felicity Coyer, CMA, am acting as scribe for Truitt Merle, MD.   I have reviewed the above documentation for accuracy and completeness, and I agree with the above.

## 2022-09-28 NOTE — Progress Notes (Unsigned)
Patient Care Team: Angelina Sheriff, MD as PCP - General (Family Medicine) Berniece Salines, DO as PCP - Cardiology (Cardiology) Truitt Merle, MD as Attending Physician (Hematology and Oncology)   CHIEF COMPLAINT: Follow up metastatic colon cancer   Oncology History Overview Note   Cancer Staging  Cancer of right colon [facility]) Staging form: Colon and Rectum, AJCC 8th Edition - Pathologic stage from 01/09/2021: Stage IIA (pT3, pN0, cM0) - Signed by Truitt Merle, MD on 02/12/2022 Total positive nodes: 0 Histologic grading system: 4 grade system Histologic grade (G): G2 Residual tumor (R): R0 - None     Cancer of right colon  10/2020 Procedure   Colonoscopy-Dr. Benson Norway     11/06/2020 Imaging   CT CHEST ABDOMEN PELVIS W CONTRAST   IMPRESSION: 1. 4.6 cm partially circumferential apple-core type neoplastic process involving the mid transverse colon. No findings for locoregional adenopathy or distant metastatic disease. 2. Enlarged prostate gland with median lobe hypertrophy impressing on the base of the bladder. 3. 16 mm left thyroid nodule. Recommend follow-up thyroid ultrasound examination.   01/09/2021 Initial Diagnosis   Colon cancer ([place])   01/09/2021 Pathology Results   B. COLON, RIGHT, RESECTION:  -  Adenocarcinoma, moderately differentiated, 3.0 cm  -  No carcinoma identified in seventeen lymph nodes (0/17)  -  Margins uninvolved by carcinoma  -  Tubular adenoma (x2)  -  Benign appendix  -  See oncology table and comment below   Margin Status for Invasive Carcinoma: All margins negative for  invasive carcinoma       Distance from Invasive Carcinoma to Radial (Circumferential)       Margin Status for Non-Invasive Tumor: All margins negative for  high-grade dysplasia / intramucosal carcinoma and low-grade dysplasia  Regional Lymph Nodes:       Number of Lymph Nodes with Tumor: 0       Number of Lymph Nodes Examined: 17  Tumor Deposits: 0  Distant Metastasis:        Distant Site(s) Involved: Not applicable  Pathologic Stage Classification (pTNM, AJCC 8th Edition): pT3, pN0   MMR Stable    01/09/2021 Cancer Staging   Staging form: Colon and Rectum, AJCC 8th Edition - Pathologic stage from 01/09/2021: Stage IIA (pT3, pN0, cM0) - Signed by Truitt Merle, MD on 02/12/2022 Total positive nodes: 0 Histologic grading system: 4 grade system Histologic grade (G): G2 Residual tumor (R): R0 - None   05/2021 Tumor Marker   Patient's tumor was tested for the following markers: CEA. Results of the tumor marker test revealed 2.1.   12/11/2021 Procedure   Colonoscopy-Dr. Benson Norway  There was evidence of a prior functional end-to-end ileo-colonic anastomosis in the transverse colon. This was patent and was characterized by healthy appearing mucosa. The anastomosis was traversed. Findings: Scattered small and large-mouthed diverticula were found in the sigmoid colon. - Patent functional end-to-end ileo-colonic anastomosis, characterized by healthy appearing mucosa. - Diverticulosis in the sigmoid colon. - No specimens collected.   01/25/2022 Imaging   CT CHEST ABDOMEN PELVIS W CONTRAST   IMPRESSION: 1. New right-sided pulmonary nodules measure up to 11 mm, nonspecific but concerning for metastatic disease. Consider further evaluation with nuclear medicine PET/CT. 2. New nodularity/lymph nodes in the central mesentery, concerning for nodal disease involvement. 3. Slightly increased size of prominent retroperitoneal lymph nodes, nonspecific but also somewhat concerning for nodal disease involvement. 4. Surgical change of partial colectomy without evidence of local recurrence. 5. Stable prominent mediastinal lymph nodes, nonspecific  but stability is reassuring. Attention on follow-up imaging suggested. 6. Stable prominent right external iliac lymph node, nonspecific but stability is reassuring. Attention on follow-up imaging suggested. 7. Stable hypodense 8 mm  hepatic lesion technically too small to accurately characterize but stability is reassuring. Attention on follow-up imaging suggested. 8. 1.9 cm incidental left thyroid nodule. Recommend thyroid US. Reference: J Am Coll Radiol. 2015 Feb;12(2): 143-50 9.  Aortic Atherosclerosis (ICD10-I70.0).   02/09/2022 Procedure   Bronchoscopy under the care of Dr. Valeta Harms    02/09/2022 Pathology Results   FINAL MICROSCOPIC DIAGNOSIS:   A. LUNG, RUL, FINE NEEDLE ASPIRATION  BIOPSY FORCEPS:  Adenocarcinoma with morphologic features consistent with metastasis from a colorectal primary.  Please see comment.   Comment: The following immunostains are performed with appropriate controls :  TTF-1: Negative .  Napsin A: Negative .  CK7: Negative.  CK20: Positive.  CDX2: Positive .   The above immuno histochemical profile is supportive of the rendered diagnosis.    08/18/2022 - 08/18/2022 Chemotherapy   Patient is on Treatment Plan : COLORECTAL Bevacizumab q14d     Colon cancer metastasized to lung  02/09/2022 Pathology Results   FINAL MICROSCOPIC DIAGNOSIS:   A. LUNG, RUL, FINE NEEDLE ASPIRATION  BIOPSY FORCEPS:  Adenocarcinoma with morphologic features consistent with metastasis from a colorectal primary.  Please see comment.   Comment: The following immunostains are performed with appropriate controls :  TTF-1: Negative .  Napsin A: Negative .  CK7: Negative.  CK20: Positive.  CDX2: Positive .   The above immuno histochemical profile is supportive of the rendered diagnosis.    02/12/2022 Initial Diagnosis   Colon cancer metastasized to lung ([place])   03/03/2022 - 07/31/2022 Chemotherapy   Patient is on Treatment Plan : COLORECTAL FOLFOX + Bevacizumab q14d     07/26/2022 Imaging    IMPRESSION: 1. Near-complete interval resolution of nodular and masslike areas of irregular consolidation, likely resolving infection or organizing pneumonia/drug reaction. 2. Additional previously seen pulmonary  nodules are stable, consistent with treated pulmonary metastatic disease. 3. Mild peribronchovascular reticular opacities which are most pronounced in the mid and lower lungs, similar to prior exam, progressed when compared with prior dated January 15, 2022. Findings can be seen in the setting of interstitial lung disease, pattern is most consistent with fibrotic NSIP. 4. Aortic Atherosclerosis (ICD10-I70.0).   09/08/2022 -  Chemotherapy   Patient is on Treatment Plan : COLORECTAL Bevacizumab q21d        CURRENT THERAPY:  - Maintenance Xeloda 1500 mg AM/2000 mg PM on days 1 - 14 and bevacizumab on day 1, q21 days  INTERVAL HISTORY Mr. Thomas Knight returns for follow up as scheduled. Last seen by Dr. Burr Medico 09/08/22 and completed another cycle of Beva. He continues Xeloda 2 weeks on/1 week off, he is currently ***  ROS   Past Medical History:  Diagnosis Date   Bilateral leg edema 08/24/2019   Cancer ([place])    skin ca, colon   Chronic cough    Chronic pain of left knee 12/01/2015   COVID-19    06/2019   Dysrhythmia    afib  was cardioverted   Essential hypertension 08/24/2019   Mixed hyperlipidemia 08/24/2019   Obesity (BMI 30-39.9) 08/24/2019   Persistent atrial fibrillation ([place]) 08/24/2019     Past Surgical History:  Procedure Laterality Date   ACHILLES TENDON REPAIR Left 2012   Ruptured   BRONCHIAL BIOPSY  02/09/2022   Procedure: BRONCHIAL BIOPSIES;  Surgeon: Garner Nash,  DO;  Location: [facility];  Service: Pulmonary;;   BRONCHIAL NEEDLE ASPIRATION BIOPSY  02/09/2022   Procedure: BRONCHIAL NEEDLE ASPIRATION BIOPSIES;  Surgeon: Garner Nash, DO;  Location: [facility];  Service: Pulmonary;;   COLONOSCOPY WITH PROPOFOL N/A 12/11/2021   Procedure: COLONOSCOPY WITH PROPOFOL;  Surgeon: Carol Ada, MD;  Location: [facility];  Service: Gastroenterology;  Laterality: N/A;   HERNIA REPAIR     umbilical hernia   IR IMAGING GUIDED PORT INSERTION  02/18/2022   SKIN SURGERY  Left    Basal cell carcinoma on left forearm   XI ROBOTIC ASSISTED LOWER ANTERIOR RESECTION N/A 01/09/2021   Procedure: XI ROBOTIC ASSISTED RIGHT HEMI COLECTOMY, LYSIS OF ADHESIONS, SMALL BOWEL RESECTION, INTRAOPERATIVE ASSESMENT OF PERFUSION, PARTIAL OMENTECTOMY;  Surgeon: Ileana Roup, MD;  Location: [facility];  Service: General;  Laterality: N/A;     Outpatient Encounter Medications as of 09/29/2022  Medication Sig Note   acetaminophen (TYLENOL) 500 MG tablet Take 500-1,000 mg by mouth every 6 (six) hours as needed (pain.). 02/05/2022: rarely   amLODipine (NORVASC) 10 MG tablet Take 1 tablet (10 mg total) by mouth daily.    Ascorbic Acid (VITAMIN C WITH ROSE HIPS) 500 MG tablet Take 500 mg by mouth every evening.    Bacillus Coagulans-Inulin (ALIGN PREBIOTIC-PROBIOTIC PO) Take by mouth.    capecitabine (XELODA) 500 MG tablet Take 4 tablets (2000 mg total) by mouth 2 (two) times daily. Take within 30 minutes after meals. Take for 14 days, then off 7 days. Repeat every 21 days.    Cholecalciferol (D3-1000) 25 MCG (1000 UT) capsule Take 1,000 Units by mouth every evening.    diphenoxylate-atropine (LOMOTIL) 2.5-0.025 MG tablet Take 1 tablet by mouth 4 (four) times daily as needed for diarrhea or loose stools.    ELIQUIS 5 MG TABS tablet Take 1 tablet (5 mg total) by mouth 2 (two) times daily.    metoprolol tartrate (LOPRESSOR) 25 MG tablet Take 1 tablet (25 mg total) by mouth 2 (two) times daily. 02/09/2022: Took on 14th at [facility] (JOINT SUPPORT PO) Take 1 tablet by mouth daily. Instaflex Advanced Joint Support    nitroGLYCERIN (NITROSTAT) 0.4 MG SL tablet Place 1 tablet (0.4 mg total) under the tongue every 5 (five) minutes as needed.    potassium chloride SA (KLOR-CON M) 20 MEQ tablet Take 1 tablet (20 mEq total) by mouth daily.    sacubitril-valsartan (ENTRESTO) 24-26 MG Take 1 tablet by mouth 2 (two) times daily.    spironolactone (ALDACTONE) 50 MG tablet Take 1 tablet  (50 mg total) by mouth daily.    vitamin B-12 (CYANOCOBALAMIN) 1000 MCG tablet Take 1,000 mcg by mouth every evening.    Zinc 50 MG CAPS Take 50 mg by mouth every evening.    No facility-administered encounter medications on file as of 09/29/2022.     There were no vitals filed for this visit. There is no height or weight on file to calculate BMI.   PHYSICAL EXAM GENERAL:alert, no distress and comfortable SKIN: no rash  EYES: sclera clear NECK: without mass LYMPH:  no palpable cervical or supraclavicular lymphadenopathy  LUNGS: clear with normal breathing effort HEART: regular rate & rhythm, no lower extremity edema ABDOMEN: abdomen soft, non-tender and normal bowel sounds NEURO: alert & oriented x 3 with fluent speech, no focal motor/sensory deficits Breast exam:  PAC without erythema    CBC    Component Value Date/Time   WBC 4.8 09/08/2022 1215  WBC 6.2 02/09/2022 0731   RBC 4.05 (L) 09/08/2022 1215   HGB 14.3 09/08/2022 1215   HGB 16.5 08/01/2019 0959   HCT 40.1 09/08/2022 1215   HCT 47.4 08/01/2019 0959   PLT 151 09/08/2022 1215   PLT 198 08/01/2019 0959   MCV 99.0 09/08/2022 1215   MCV 90 08/01/2019 0959   MCH 35.3 (H) 09/08/2022 1215   MCHC 35.7 09/08/2022 1215   RDW 17.6 (H) 09/08/2022 1215   RDW 13.0 08/01/2019 0959   LYMPHSABS 1.5 09/08/2022 1215   MONOABS 0.8 09/08/2022 1215   EOSABS 0.1 09/08/2022 1215   BASOSABS 0.0 09/08/2022 1215     CMP     Component Value Date/Time   NA 141 09/08/2022 1215   NA 142 02/20/2020 0912   K 3.0 (L) 09/08/2022 1215   CL 106 09/08/2022 1215   CO2 29 09/08/2022 1215   GLUCOSE 102 (H) 09/08/2022 1215   BUN 6 (L) 09/08/2022 1215   BUN 9 02/20/2020 0912   CREATININE 0.73 09/08/2022 1215   CALCIUM 8.4 (L) 09/08/2022 1215   PROT 6.1 (L) 09/08/2022 1215   ALBUMIN 3.4 (L) 09/08/2022 1215   AST 25 09/08/2022 1215   ALT 16 09/08/2022 1215   ALKPHOS 51 09/08/2022 1215   BILITOT 0.7 09/08/2022 1215   GFRNONAA >60  09/08/2022 1215   GFRAA 98 02/20/2020 0912     ASSESSMENT & PLAN:  PLAN:  No orders of the defined types were placed in this encounter.     All questions were answered. The patient knows to call the clinic with any problems, questions or concerns. No barriers to learning were detected. I spent *** counseling the patient face to face. The total time spent in the appointment was *** and more than 50% was on counseling, review of test results, and coordination of care.   Cira Rue, NP-C @DATE @

## 2022-09-28 NOTE — Assessment & Plan Note (Signed)
-  stage II (pT3, N0), metastatic disease to lung and possible nodes in July 2023, MSS, KRAS/NRAS/BRAF wild type   -found on screening colonoscopy. S/p right hemicolectomy 01/09/21. -lung metastasis confirmed in RUL by bronchoscopy on 02/09/22. -[employer] showed MSS, low tumor burden, KRAS/NRAS/BRAF wild-type, no other targetable mutations. The benefit of EGFR inhibitor is much less in right-sided colon cancer, so I will not give it as first line but may consider in subsequent lines.  -he started FOLFOX on 03/03/22. He tolerated well overall with diarrhea for several days. Bevacizumab added with C2 (9/20).  -CT scan 05/26/2022 showed improved lung mets, but showed new infiltrative lung change, possible related to his recent URI -repeated CT chest on 07/26/2022 showed stable known lung mets (overall excellent response, with minimum residual dsease) and resolved previously see inflammatory change in RLL -I have changed treatment to maintenance xeloda and beva every 3 weeks, he started C1 on 08/18/2022.  -due to diarrhea, Xeloda dose reduced for cycle 2

## 2022-09-29 ENCOUNTER — Encounter: Payer: Self-pay | Admitting: Hematology

## 2022-09-29 ENCOUNTER — Telehealth: Payer: Self-pay

## 2022-09-29 ENCOUNTER — Inpatient Hospital Stay: Payer: Medicare HMO | Attending: Physician Assistant | Admitting: Hematology

## 2022-09-29 ENCOUNTER — Other Ambulatory Visit: Payer: Self-pay

## 2022-09-29 ENCOUNTER — Inpatient Hospital Stay: Payer: Medicare HMO

## 2022-09-29 VITALS — BP 109/65 | HR 76 | Temp 97.9°F | Resp 17 | Wt 216.0 lb

## 2022-09-29 DIAGNOSIS — Z5112 Encounter for antineoplastic immunotherapy: Secondary | ICD-10-CM | POA: Insufficient documentation

## 2022-09-29 DIAGNOSIS — C189 Malignant neoplasm of colon, unspecified: Secondary | ICD-10-CM | POA: Diagnosis not present

## 2022-09-29 DIAGNOSIS — Z452 Encounter for adjustment and management of vascular access device: Secondary | ICD-10-CM | POA: Diagnosis not present

## 2022-09-29 DIAGNOSIS — R197 Diarrhea, unspecified: Secondary | ICD-10-CM | POA: Insufficient documentation

## 2022-09-29 DIAGNOSIS — E876 Hypokalemia: Secondary | ICD-10-CM | POA: Diagnosis not present

## 2022-09-29 DIAGNOSIS — Z5111 Encounter for antineoplastic chemotherapy: Secondary | ICD-10-CM | POA: Diagnosis present

## 2022-09-29 DIAGNOSIS — C182 Malignant neoplasm of ascending colon: Secondary | ICD-10-CM

## 2022-09-29 DIAGNOSIS — C7800 Secondary malignant neoplasm of unspecified lung: Secondary | ICD-10-CM | POA: Diagnosis not present

## 2022-09-29 DIAGNOSIS — R630 Anorexia: Secondary | ICD-10-CM | POA: Insufficient documentation

## 2022-09-29 DIAGNOSIS — E860 Dehydration: Secondary | ICD-10-CM

## 2022-09-29 DIAGNOSIS — C184 Malignant neoplasm of transverse colon: Secondary | ICD-10-CM | POA: Diagnosis present

## 2022-09-29 LAB — CMP (CANCER CENTER ONLY)
ALT: 15 U/L (ref 0–44)
AST: 26 U/L (ref 15–41)
Albumin: 3.2 g/dL — ABNORMAL LOW (ref 3.5–5.0)
Alkaline Phosphatase: 46 U/L (ref 38–126)
Anion gap: 11 (ref 5–15)
BUN: 31 mg/dL — ABNORMAL HIGH (ref 8–23)
CO2: 24 mmol/L (ref 22–32)
Calcium: 8.5 mg/dL — ABNORMAL LOW (ref 8.9–10.3)
Chloride: 96 mmol/L — ABNORMAL LOW (ref 98–111)
Creatinine: 1.12 mg/dL (ref 0.61–1.24)
GFR, Estimated: >60 mL/min (ref >60)
Glucose, Bld: 103 mg/dL — ABNORMAL HIGH (ref 70–99)
Potassium: 2.5 mmol/L — CL (ref 3.5–5.1)
Sodium: 131 mmol/L — ABNORMAL LOW (ref 135–145)
Total Bilirubin: 1.1 mg/dL (ref 0.3–1.2)
Total Protein: 6.3 g/dL — ABNORMAL LOW (ref 6.5–8.1)

## 2022-09-29 LAB — CBC WITH DIFFERENTIAL (CANCER CENTER ONLY)
Abs Immature Granulocytes: 0.02 10*3/uL (ref 0.00–0.07)
Basophils Absolute: 0 10*3/uL (ref 0.0–0.1)
Basophils Relative: 0 %
Eosinophils Absolute: 0.1 10*3/uL (ref 0.0–0.5)
Eosinophils Relative: 1 %
HCT: 42.9 % (ref 39.0–52.0)
Hemoglobin: 15.5 g/dL (ref 13.0–17.0)
Immature Granulocytes: 0 %
Lymphocytes Relative: 23 %
Lymphs Abs: 1 10*3/uL (ref 0.7–4.0)
MCH: 35.1 pg — ABNORMAL HIGH (ref 26.0–34.0)
MCHC: 36.1 g/dL — ABNORMAL HIGH (ref 30.0–36.0)
MCV: 97.1 fL (ref 80.0–100.0)
Monocytes Absolute: 1.2 10*3/uL — ABNORMAL HIGH (ref 0.1–1.0)
Monocytes Relative: 27 %
Neutro Abs: 2.1 10*3/uL (ref 1.7–7.7)
Neutrophils Relative %: 49 %
Platelet Count: 154 10*3/uL (ref 150–400)
RBC: 4.42 MIL/uL (ref 4.22–5.81)
RDW: 17.3 % — ABNORMAL HIGH (ref 11.5–15.5)
WBC Count: 4.5 10*3/uL (ref 4.0–10.5)
nRBC: 0 % (ref 0.0–0.2)

## 2022-09-29 MED ORDER — SODIUM CHLORIDE 0.9 % IV SOLN: INTRAVENOUS | Status: DC

## 2022-09-29 MED ORDER — POTASSIUM CHLORIDE 10 MEQ/100ML IV SOLN
10.0000 meq | INTRAVENOUS | Status: AC
  Administered 2022-09-29 (×2): 10 meq via INTRAVENOUS
  Filled 2022-09-29: qty 100

## 2022-09-29 MED ORDER — SODIUM CHLORIDE 0.9% FLUSH
10.0000 mL | Freq: Once | INTRAVENOUS | Status: AC
  Administered 2022-09-29: 10 mL

## 2022-09-29 MED ORDER — POTASSIUM CHLORIDE CRYS ER 20 MEQ PO TBCR: 20.0000 meq | EXTENDED_RELEASE_TABLET | Freq: Every day | ORAL | 1 refills | Status: DC

## 2022-09-29 NOTE — Telephone Encounter (Signed)
CRITICAL VALUE STICKER  CRITICAL VALUE: Potassium 2.5  RECEIVER (on-site recipient of call): Onetha Gaffey P. LPN  DATE & TIME NOTIFIED: 09/29/22 9:07 am  MESSENGER (representative from lab): Janett Billow  MD NOTIFIED: Dr. Burr Medico  TIME OF NOTIFICATION: 9:11 am

## 2022-09-29 NOTE — Progress Notes (Signed)
Per Dr. Burr Medico, Only administer 541ml total d/t pt has congestive heart failure.  Run at a rate of 244ml/hr.

## 2022-10-01 ENCOUNTER — Other Ambulatory Visit: Payer: Self-pay

## 2022-10-01 ENCOUNTER — Other Ambulatory Visit (HOSPITAL_COMMUNITY): Payer: Self-pay

## 2022-10-04 ENCOUNTER — Other Ambulatory Visit: Payer: Self-pay

## 2022-10-04 ENCOUNTER — Other Ambulatory Visit (HOSPITAL_COMMUNITY): Payer: Self-pay

## 2022-10-11 ENCOUNTER — Inpatient Hospital Stay: Payer: Medicare HMO

## 2022-10-11 NOTE — Progress Notes (Signed)
Nutrition Follow-up:  Wife called and left message with questions about foods patient can eat.   Returned wife's call.  Patient reports that diarrhea has improved, now 2 times a day. Taking lomotil as  needed.  Reports that appetite has improved and wanting to try more foods.     INTERVENTION:  Overall, with improved diarrhea and appetite would encourage patient to liberalize diet.   Try small amount of food (ie milkshake) and monitor symptoms. Food diary might be helpful.       MONITORING, EVALUATION, GOAL: weight trends, intake   NEXT VISIT: Wed April 25 during infusion  Vicky Mccanless B. Freida Busman, RD, LDN Registered Dietitian [phone]

## 2022-10-19 NOTE — Assessment & Plan Note (Signed)
-  stage II (pT3, N0), metastatic disease to lung and possible nodes in July 2023, MSS, KRAS/NRAS/BRAF wild type   -found on screening colonoscopy. S/p right hemicolectomy 01/09/21. -lung metastasis confirmed in RUL by bronchoscopy on 02/09/22. -[employer] showed MSS, low tumor burden, KRAS/NRAS/BRAF wild-type, no other targetable mutations. The benefit of EGFR inhibitor is much less in right-sided colon cancer, so I will not give it as first line but may consider in subsequent lines.  -he started FOLFOX on 03/03/22. He tolerated well overall with diarrhea for several days. Bevacizumab added with C2 (9/20).  -CT scan 05/26/2022 showed improved lung mets, but showed new infiltrative lung change, possible related to his recent URI -repeated CT chest on 07/26/2022 showed stable known lung mets (overall excellent response, with minimum residual dsease) and resolved previously see inflammatory change in RLL -I have changed treatment to maintenance xeloda and beva every 3 weeks, he started C1 on 08/18/2022.  -due to recurrent diarrhea, I stopped Xeloda after 2 cycles -plan to change treatment to 5-fu pump infusion and beva every 2 weeks

## 2022-10-20 ENCOUNTER — Inpatient Hospital Stay: Payer: Medicare HMO

## 2022-10-20 ENCOUNTER — Inpatient Hospital Stay: Payer: Medicare HMO | Admitting: Dietician

## 2022-10-20 ENCOUNTER — Encounter: Payer: Self-pay | Admitting: Hematology

## 2022-10-20 ENCOUNTER — Inpatient Hospital Stay (HOSPITAL_BASED_OUTPATIENT_CLINIC_OR_DEPARTMENT_OTHER): Payer: Medicare HMO | Admitting: Hematology

## 2022-10-20 VITALS — BP 125/78 | HR 62 | Temp 97.8°F | Resp 18 | Ht 71.0 in | Wt 216.6 lb

## 2022-10-20 VITALS — BP 137/70 | HR 54

## 2022-10-20 DIAGNOSIS — Z452 Encounter for adjustment and management of vascular access device: Secondary | ICD-10-CM | POA: Diagnosis not present

## 2022-10-20 DIAGNOSIS — R630 Anorexia: Secondary | ICD-10-CM | POA: Diagnosis not present

## 2022-10-20 DIAGNOSIS — C189 Malignant neoplasm of colon, unspecified: Secondary | ICD-10-CM

## 2022-10-20 DIAGNOSIS — C182 Malignant neoplasm of ascending colon: Secondary | ICD-10-CM | POA: Diagnosis not present

## 2022-10-20 DIAGNOSIS — C184 Malignant neoplasm of transverse colon: Secondary | ICD-10-CM | POA: Diagnosis not present

## 2022-10-20 DIAGNOSIS — E876 Hypokalemia: Secondary | ICD-10-CM | POA: Diagnosis not present

## 2022-10-20 DIAGNOSIS — E860 Dehydration: Secondary | ICD-10-CM

## 2022-10-20 DIAGNOSIS — Z5112 Encounter for antineoplastic immunotherapy: Secondary | ICD-10-CM | POA: Diagnosis not present

## 2022-10-20 DIAGNOSIS — Z5111 Encounter for antineoplastic chemotherapy: Secondary | ICD-10-CM | POA: Diagnosis not present

## 2022-10-20 DIAGNOSIS — C7800 Secondary malignant neoplasm of unspecified lung: Secondary | ICD-10-CM | POA: Diagnosis not present

## 2022-10-20 DIAGNOSIS — R197 Diarrhea, unspecified: Secondary | ICD-10-CM | POA: Diagnosis not present

## 2022-10-20 LAB — CMP (CANCER CENTER ONLY)
ALT: 11 U/L (ref 0–44)
AST: 17 U/L (ref 15–41)
Albumin: 3.3 g/dL — ABNORMAL LOW (ref 3.5–5.0)
Alkaline Phosphatase: 44 U/L (ref 38–126)
Anion gap: 6 (ref 5–15)
BUN: 6 mg/dL — ABNORMAL LOW (ref 8–23)
CO2: 29 mmol/L (ref 22–32)
Calcium: 8.5 mg/dL — ABNORMAL LOW (ref 8.9–10.3)
Chloride: 108 mmol/L (ref 98–111)
Creatinine: 0.76 mg/dL (ref 0.61–1.24)
GFR, Estimated: >60 mL/min (ref >60)
Glucose, Bld: 93 mg/dL (ref 70–99)
Potassium: 3 mmol/L — ABNORMAL LOW (ref 3.5–5.1)
Sodium: 143 mmol/L (ref 135–145)
Total Bilirubin: 0.7 mg/dL (ref 0.3–1.2)
Total Protein: 5.9 g/dL — ABNORMAL LOW (ref 6.5–8.1)

## 2022-10-20 LAB — CBC WITH DIFFERENTIAL (CANCER CENTER ONLY)
Abs Immature Granulocytes: 0.01 10*3/uL (ref 0.00–0.07)
Basophils Absolute: 0 10*3/uL (ref 0.0–0.1)
Basophils Relative: 1 %
Eosinophils Absolute: 0.2 10*3/uL (ref 0.0–0.5)
Eosinophils Relative: 4 %
HCT: 39.8 % (ref 39.0–52.0)
Hemoglobin: 13.7 g/dL (ref 13.0–17.0)
Immature Granulocytes: 0 %
Lymphocytes Relative: 29 %
Lymphs Abs: 1.3 10*3/uL (ref 0.7–4.0)
MCH: 35.1 pg — ABNORMAL HIGH (ref 26.0–34.0)
MCHC: 34.4 g/dL (ref 30.0–36.0)
MCV: 102.1 fL — ABNORMAL HIGH (ref 80.0–100.0)
Monocytes Absolute: 0.7 10*3/uL (ref 0.1–1.0)
Monocytes Relative: 15 %
Neutro Abs: 2.4 10*3/uL (ref 1.7–7.7)
Neutrophils Relative %: 51 %
Platelet Count: 130 10*3/uL — ABNORMAL LOW (ref 150–400)
RBC: 3.9 MIL/uL — ABNORMAL LOW (ref 4.22–5.81)
RDW: 17.5 % — ABNORMAL HIGH (ref 11.5–15.5)
WBC Count: 4.6 10*3/uL (ref 4.0–10.5)
nRBC: 0 % (ref 0.0–0.2)

## 2022-10-20 LAB — TOTAL PROTEIN, URINE DIPSTICK: Protein, ur: NEGATIVE mg/dL

## 2022-10-20 MED ORDER — SODIUM CHLORIDE 0.9% FLUSH
10.0000 mL | Freq: Once | INTRAVENOUS | Status: AC
  Administered 2022-10-20: 10 mL

## 2022-10-20 MED ORDER — SODIUM CHLORIDE 0.9% FLUSH: 10.0000 mL | INTRAVENOUS | Status: DC | PRN

## 2022-10-20 MED ORDER — PROCHLORPERAZINE MALEATE 10 MG PO TABS
10.0000 mg | ORAL_TABLET | Freq: Once | ORAL | Status: AC
  Administered 2022-10-20: 10 mg via ORAL
  Filled 2022-10-20: qty 1

## 2022-10-20 MED ORDER — HEPARIN SOD (PORK) LOCK FLUSH 100 UNIT/ML IV SOLN: 500.0000 [IU] | Freq: Once | INTRAVENOUS | Status: DC | PRN

## 2022-10-20 MED ORDER — SODIUM CHLORIDE 0.9 % IV SOLN
2000.0000 mg/m2 | INTRAVENOUS | Status: DC
  Administered 2022-10-20: 4450 mg via INTRAVENOUS
  Filled 2022-10-20: qty 89

## 2022-10-20 MED ORDER — SODIUM CHLORIDE 0.9 % IV SOLN
400.0000 mg/m2 | Freq: Once | INTRAVENOUS | Status: AC
  Administered 2022-10-20: 888 mg via INTRAVENOUS
  Filled 2022-10-20: qty 44.4

## 2022-10-20 MED ORDER — SODIUM CHLORIDE 0.9 % IV SOLN: Freq: Once | INTRAVENOUS | Status: AC

## 2022-10-20 MED ORDER — SODIUM CHLORIDE 0.9 % IV SOLN
5.0000 mg/kg | Freq: Once | INTRAVENOUS | Status: AC
  Administered 2022-10-20: 500 mg via INTRAVENOUS
  Filled 2022-10-20: qty 4

## 2022-10-20 NOTE — Progress Notes (Signed)
[facility]   Telephone:(336) [phone] Fax:(336) [phone]   Clinic Follow up Note   Patient Care Team: Noni Saupe, MD as PCP - General (Family Medicine) [name], DO as PCP - Cardiology (Cardiology) Malachy Mood, MD as Attending Physician (Hematology and Oncology)  Date of Service:  10/20/2022  CHIEF COMPLAINT: f/u of metastatic colon cancer    CURRENT THERAPY:  - Maintenance Xeloda, will change back to 5-fu pump infusion in 3 weeks  -Bevacizumab  ASSESSMENT:  Thomas Knight is a 77 y.o. male with   Cancer of right colon (HCC) -stage II (pT3, N0), metastatic disease to lung and possible nodes in July 2023, MSS, KRAS/NRAS/BRAF wild type   -found on screening colonoscopy. S/p right hemicolectomy 01/09/21. -lung metastasis confirmed in RUL by bronchoscopy on 02/09/22. -FoundationOne showed MSS, low tumor burden, KRAS/NRAS/BRAF wild-type, no other targetable mutations. The benefit of EGFR inhibitor is much less in right-sided colon cancer, so I will not give it as first line but may consider in subsequent lines.  -he started FOLFOX on 03/03/22. He tolerated well overall with diarrhea for several days. Bevacizumab added with C2 (9/20).  -CT scan 05/26/2022 showed improved lung mets, but showed new infiltrative lung change, possible related to his recent URI -repeated CT chest on 07/26/2022 showed stable known lung mets (overall excellent response, with minimum residual dsease) and resolved previously see inflammatory change in RLL -I have changed treatment to maintenance xeloda and beva every 3 weeks, he started C1 on 08/18/2022.  -due to recurrent diarrhea, I stopped Xeloda after 2 cycles -plan to change treatment to 5-fu pump infusion and beva every 2 weeks  -due to significant diarrhea, will give reduced does 5-fu today  -will repeat CT in 3-4 weeks    PLAN: -lab reviewed - change treatment back to 5-FU pump with slightly reduce dose today, continue Beva --order   CT CAP to be done in 3-4 weeks  -lab/flush and chemo 11/03/2022   SUMMARY OF ONCOLOGIC HISTORY: Oncology History Overview Note   Cancer Staging  Cancer of right colon [facility]) Staging form: Colon and Rectum, AJCC 8th Edition - Pathologic stage from 01/09/2021: Stage IIA (pT3, pN0, cM0) - Signed by Malachy Mood, MD on 02/12/2022 Total positive nodes: 0 Histologic grading system: 4 grade system Histologic grade (G): G2 Residual tumor (R): R0 - None     Cancer of right colon  10/2020 Procedure   Colonoscopy-Dr. Elnoria Howard     11/06/2020 Imaging   CT CHEST ABDOMEN PELVIS W CONTRAST   IMPRESSION: 1. 4.6 cm partially circumferential apple-core type neoplastic process involving the mid transverse colon. No findings for locoregional adenopathy or distant metastatic disease. 2. Enlarged prostate gland with median lobe hypertrophy impressing on the base of the bladder. 3. 16 mm left thyroid nodule. Recommend follow-up thyroid ultrasound examination.   01/09/2021 Initial Diagnosis   Colon cancer (HCC)   01/09/2021 Pathology Results   B. COLON, RIGHT, RESECTION:  -  Adenocarcinoma, moderately differentiated, 3.0 cm  -  No carcinoma identified in seventeen lymph nodes (0/17)  -  Margins uninvolved by carcinoma  -  Tubular adenoma (x2)  -  Benign appendix  -  See oncology table and comment below   Margin Status for Invasive Carcinoma: All margins negative for  invasive carcinoma       Distance from Invasive Carcinoma to Radial (Circumferential)       Margin Status for Non-Invasive Tumor: All margins negative for  high-grade dysplasia / intramucosal carcinoma  and low-grade dysplasia  Regional Lymph Nodes:       Number of Lymph Nodes with Tumor: 0       Number of Lymph Nodes Examined: 17  Tumor Deposits: 0  Distant Metastasis:       Distant Site(s) Involved: Not applicable  Pathologic Stage Classification (pTNM, AJCC 8th Edition): pT3, pN0   MMR Stable    01/09/2021 Cancer Staging   Staging  form: Colon and Rectum, AJCC 8th Edition - Pathologic stage from 01/09/2021: Stage IIA (pT3, pN0, cM0) - Signed by Malachy Mood, MD on 02/12/2022 Total positive nodes: 0 Histologic grading system: 4 grade system Histologic grade (G): G2 Residual tumor (R): R0 - None   05/2021 Tumor Marker   Patient's tumor was tested for the following markers: CEA. Results of the tumor marker test revealed 2.1.   12/11/2021 Procedure   Colonoscopy-Dr. Elnoria Howard  There was evidence of a prior functional end-to-end ileo-colonic anastomosis in the transverse colon. This was patent and was characterized by healthy appearing mucosa. The anastomosis was traversed. Findings: Scattered small and large-mouthed diverticula were found in the sigmoid colon. - Patent functional end-to-end ileo-colonic anastomosis, characterized by healthy appearing mucosa. - Diverticulosis in the sigmoid colon. - No specimens collected.   01/25/2022 Imaging   CT CHEST ABDOMEN PELVIS W CONTRAST   IMPRESSION: 1. New right-sided pulmonary nodules measure up to 11 mm, nonspecific but concerning for metastatic disease. Consider further evaluation with nuclear medicine PET/CT. 2. New nodularity/lymph nodes in the central mesentery, concerning for nodal disease involvement. 3. Slightly increased size of prominent retroperitoneal lymph nodes, nonspecific but also somewhat concerning for nodal disease involvement. 4. Surgical change of partial colectomy without evidence of local recurrence. 5. Stable prominent mediastinal lymph nodes, nonspecific but stability is reassuring. Attention on follow-up imaging suggested. 6. Stable prominent right external iliac lymph node, nonspecific but stability is reassuring. Attention on follow-up imaging suggested. 7. Stable hypodense 8 mm hepatic lesion technically too small to accurately characterize but stability is reassuring. Attention on follow-up imaging suggested. 8. 1.9 cm incidental left thyroid  nodule. Recommend thyroid US. Reference: J Am Coll Radiol. 2015 Feb;12(2): 143-50 9.  Aortic Atherosclerosis (ICD10-I70.0).   02/09/2022 Procedure   Bronchoscopy under the care of Dr. Tonia Brooms    02/09/2022 Pathology Results   FINAL MICROSCOPIC DIAGNOSIS:   A. LUNG, RUL, FINE NEEDLE ASPIRATION  BIOPSY FORCEPS:  Adenocarcinoma with morphologic features consistent with metastasis from a colorectal primary.  Please see comment.   Comment: The following immunostains are performed with appropriate controls :  TTF-1: Negative .  Napsin A: Negative .  CK7: Negative.  CK20: Positive.  CDX2: Positive .   The above immuno histochemical profile is supportive of the rendered diagnosis.    08/18/2022 - 08/18/2022 Chemotherapy   Patient is on Treatment Plan : COLORECTAL Bevacizumab q14d     Colon cancer metastasized to lung  02/09/2022 Pathology Results   FINAL MICROSCOPIC DIAGNOSIS:   A. LUNG, RUL, FINE NEEDLE ASPIRATION  BIOPSY FORCEPS:  Adenocarcinoma with morphologic features consistent with metastasis from a colorectal primary.  Please see comment.   Comment: The following immunostains are performed with appropriate controls :  TTF-1: Negative .  Napsin A: Negative .  CK7: Negative.  CK20: Positive.  CDX2: Positive .   The above immuno histochemical profile is supportive of the rendered diagnosis.    02/12/2022 Initial Diagnosis   Colon cancer metastasized to lung [facility])   03/03/2022 - 07/31/2022 Chemotherapy   Patient is on  Treatment Plan : COLORECTAL FOLFOX + Bevacizumab q14d     07/26/2022 Imaging    IMPRESSION: 1. Near-complete interval resolution of nodular and masslike areas of irregular consolidation, likely resolving infection or organizing pneumonia/drug reaction. 2. Additional previously seen pulmonary nodules are stable, consistent with treated pulmonary metastatic disease. 3. Mild peribronchovascular reticular opacities which are most pronounced in the mid and lower  lungs, similar to prior exam, progressed when compared with prior dated January 15, 2022. Findings can be seen in the setting of interstitial lung disease, pattern is most consistent with fibrotic NSIP. 4. Aortic Atherosclerosis (ICD10-I70.0).   09/08/2022 - 09/08/2022 Chemotherapy   Patient is on Treatment Plan : COLORECTAL Bevacizumab q21d     10/20/2022 -  Chemotherapy   Patient is on Treatment Plan : COLORECTAL 5FU + Leucovorin (Modified [place]) + Bevacizumab q14d        INTERVAL HISTORY:  [employer] is here for a follow up of  metastatic colon cancer . He was last seen by me on 09/29/2022. He presents to the clinic accompanied by wife. Pt stated for the first since being on chemo , he has not had diarrhea,  but has lomotil if need it. Pt also report of skin peeling and a rash on the back of his left leg. Pt report of having a good appetite since taking a break from chemo. Pt states he does not have much feeling in his finger tips. He states he does have some trouble button his cloths.      All other systems were reviewed with the patient and are negative.  MEDICAL HISTORY:  Past Medical History:  Diagnosis Date   Bilateral leg edema 08/24/2019   Cancer    skin ca, colon   Chronic cough    Chronic pain of left knee 12/01/2015   COVID-19    06/2019   Dysrhythmia    afib  was cardioverted   Essential hypertension 08/24/2019   Mixed hyperlipidemia 08/24/2019   Obesity (BMI 30-39.9) 08/24/2019   Persistent atrial fibrillation 08/24/2019    SURGICAL HISTORY: Past Surgical History:  Procedure Laterality Date   ACHILLES TENDON REPAIR Left 2012   Ruptured   BRONCHIAL BIOPSY  02/09/2022   Procedure: BRONCHIAL BIOPSIES;  Surgeon: Josephine Igo, DO;  Location: [facility];  Service: Pulmonary;;   BRONCHIAL NEEDLE ASPIRATION BIOPSY  02/09/2022   Procedure: BRONCHIAL NEEDLE ASPIRATION BIOPSIES;  Surgeon: Josephine Igo, DO;  Location: [facility];  Service: Pulmonary;;    COLONOSCOPY WITH PROPOFOL N/A 12/11/2021   Procedure: COLONOSCOPY WITH PROPOFOL;  Surgeon: Jeani Hawking, MD;  Location: [facility]OS[facility];  Service: Gastroenterology;  Laterality: N/A;   HERNIA REPAIR     umbilical hernia   IR IMAGING GUIDED PORT INSERTION  02/18/2022   SKIN SURGERY Left    Basal cell carcinoma on left forearm   XI ROBOTIC ASSISTED LOWER ANTERIOR RESECTION N/A 01/09/2021   Procedure: XI ROBOTIC ASSISTED RIGHT HEMI COLECTOMY, LYSIS OF ADHESIONS, SMALL BOWEL RESECTION, INTRAOPERATIVE ASSESMENT OF PERFUSION, PARTIAL OMENTECTOMY;  Surgeon: Andria Meuse, MD;  Location: [facility] ORS;  Service: General;  Laterality: N/A;    I have reviewed the social history and family history with the patient and they are unchanged from previous note.  ALLERGIES:  is allergic to furosemide and tramadol.  MEDICATIONS:  Current Outpatient Medications  Medication Sig Dispense Refill   acetaminophen (TYLENOL) 500 MG tablet Take 500-1,000 mg by mouth every 6 (six) hours as needed (pain.).     amLODipine (NORVASC)  10 MG tablet Take 1 tablet (10 mg total) by mouth daily. 30 tablet 1   Ascorbic Acid (VITAMIN C WITH ROSE HIPS) 500 MG tablet Take 500 mg by mouth every evening.     Bacillus Coagulans-Inulin (ALIGN PREBIOTIC-PROBIOTIC PO) Take by mouth.     Cholecalciferol (D3-1000) 25 MCG (1000 UT) capsule Take 1,000 Units by mouth every evening.     diphenoxylate-atropine (LOMOTIL) 2.5-0.025 MG tablet Take 1 tablet by mouth 4 (four) times daily as needed for diarrhea or loose stools. 30 tablet 3   ELIQUIS 5 MG TABS tablet Take 1 tablet (5 mg total) by mouth 2 (two) times daily. 180 tablet 1   metoprolol tartrate (LOPRESSOR) 25 MG tablet Take 1 tablet (25 mg total) by mouth 2 (two) times daily. 180 tablet 3   Misc Natural Products (JOINT SUPPORT PO) Take 1 tablet by mouth daily. Instaflex Advanced Joint Support     nitroGLYCERIN (NITROSTAT) 0.4 MG SL tablet Place 1 tablet (0.4 mg total) under the tongue every 5  (five) minutes as needed. 25 tablet 7   potassium chloride SA (KLOR-CON M) 20 MEQ tablet Take 1 tablet (20 mEq total) by mouth daily. 30 tablet 1   sacubitril-valsartan (ENTRESTO) 24-26 MG Take 1 tablet by mouth 2 (two) times daily. 180 tablet 3   spironolactone (ALDACTONE) 50 MG tablet Take 1 tablet (50 mg total) by mouth daily. 10 tablet 0   vitamin B-12 (CYANOCOBALAMIN) 1000 MCG tablet Take 1,000 mcg by mouth every evening.     Zinc 50 MG CAPS Take 50 mg by mouth every evening.     No current facility-administered medications for this visit.   Facility-Administered Medications Ordered in Other Visits  Medication Dose Route Frequency Provider Last Rate Last Admin   fluorouracil (ADRUCIL) 4,450 mg in sodium chloride 0.9 % 61 mL chemo infusion  2,000 mg/m2 (Treatment Plan Recorded) Intravenous 1 day or 1 dose Malachy Mood, MD       heparin lock flush 100 unit/mL  500 Units Intracatheter Once PRN Malachy Mood, MD       leucovorin 888 mg in sodium chloride 0.9 % 250 mL infusion  400 mg/m2 (Treatment Plan Recorded) Intravenous Once Malachy Mood, MD 589 mL/hr at 10/20/22 1016 888 mg at 10/20/22 1016   sodium chloride flush (NS) 0.9 % injection 10 mL  10 mL Intracatheter PRN Malachy Mood, MD        PHYSICAL EXAMINATION: ECOG PERFORMANCE STATUS: 1 - Symptomatic but completely ambulatory  Vitals:   10/20/22 0830  BP: 125/78  Pulse: 62  Resp: 18  Temp: 97.8 F (36.6 C)  SpO2: 99%   Wt Readings from Last 3 Encounters:  10/20/22 216 lb 9.6 oz (98.2 kg)  09/29/22 216 lb (98 kg)  09/08/22 228 lb 3.2 oz (103.5 kg)     GENERAL:alert, no distress and comfortable SKIN: skin color normal, no rashes or significant lesions EYES: normal, Conjunctiva are pink and non-injected, sclera clear  NEURO: alert & oriented x 3 with fluent speech   LABORATORY DATA:  I have reviewed the data as listed    Latest Ref Rng & Units 10/20/2022    8:06 AM 09/29/2022    8:13 AM 09/08/2022   12:15 PM  CBC  WBC 4.0 - 10.5  K/uL 4.6  4.5  4.8   Hemoglobin 13.0 - 17.0 g/dL 82.9  56.2  13.0   Hematocrit 39.0 - 52.0 % 39.8  42.9  40.1   Platelets 150 - 400 K/uL  130  154  151         Latest Ref Rng & Units 10/20/2022    8:06 AM 09/29/2022    8:13 AM 09/08/2022   12:15 PM  CMP  Glucose 70 - 99 mg/dL 93  161  096   BUN 8 - 23 mg/dL 6  31  6    Creatinine 0.61 - 1.24 mg/dL 0.45  4.09  8.11   Sodium 135 - 145 mmol/L 143  131  141   Potassium 3.5 - 5.1 mmol/L 3.0  2.5  3.0   Chloride 98 - 111 mmol/L 108  96  106   CO2 22 - 32 mmol/L 29  24  29    Calcium 8.9 - 10.3 mg/dL 8.5  8.5  8.4   Total Protein 6.5 - 8.1 g/dL 5.9  6.3  6.1   Total Bilirubin 0.3 - 1.2 mg/dL 0.7  1.1  0.7   Alkaline Phos 38 - 126 U/L 44  46  51   AST 15 - 41 U/L 17  26  25    ALT 0 - 44 U/L 11  15  16        RADIOGRAPHIC STUDIES: I have personally reviewed the radiological images as listed and agreed with the findings in the report. No results found.    Orders Placed This Encounter  Procedures   CT CHEST ABDOMEN PELVIS WO CONTRAST    Standing Status:   Future    Standing Expiration Date:   10/20/2023    Order Specific Question:   Preferred imaging location?    Answer:   [facility]    Order Specific Question:   If indicated for the ordered procedure, I authorize the administration of oral contrast media per Radiology protocol    Answer:   Yes    Order Specific Question:   Does the patient have a contrast media/X-ray dye allergy?    Answer:   No    Order Specific Question:   Release to patient    Answer:   Immediate   All questions were answered. The patient knows to call the clinic with any problems, questions or concerns. No barriers to learning was detected. The total time spent in the appointment was 30 minutes.     Malachy Mood, MD 10/20/2022   Carolin Coy, CMA, am acting as scribe for Malachy Mood, MD.   I have reviewed the above documentation for accuracy and completeness, and I agree with the above.

## 2022-10-20 NOTE — Progress Notes (Signed)
Nutrition Follow-up:  Patient with recurrent colon cancer metastatic to lung. He is receiving Bevacizumab q21d. Xeloda dose reduced due to poor tolerance.   Met with patient in infusion. He reports diarrhea has improved. States he experienced 7 episodes within one and one half hours last Thursday. Patient has started taking 2 lomotil in the mornings per MD recommendation. This has been helpful. He reports having a good appetite and less diarrhea the last few days. Patient states yesterday was the first time he had no diarrhea.   Medications: reviewed   Labs: K 3.3, BUN 6, albumin 3.3  Anthropometrics: Wt 216 lb 9.6 oz today stable x3 weeks  4/3 - 216 lb 3/13 - 228 lb 3.2 oz   NUTRITION DIAGNOSIS: Unintended weight loss stable     INTERVENTION:  Encouraged introducing foods one at a time to assess tolerance Continue smaller more frequent meals  Encouraged daily protein shake     MONITORING, EVALUATION, GOAL: weight trends, intake   NEXT VISIT: Wednesday May 22 during infusion

## 2022-10-20 NOTE — Patient Instructions (Signed)
[facility]  Discharge Instructions: Thank you for choosing [facility] to provide your oncology and hematology care.   If you have a lab appointment with the Cancer Center, please go directly to the Cancer Center and check in at the registration area.   Wear comfortable clothing and clothing appropriate for easy access to any Portacath or PICC line.   We strive to give you quality time with your provider. You may need to reschedule your appointment if you arrive late (15 or more minutes).  Arriving late affects you and other patients whose appointments are after yours.  Also, if you miss three or more appointments without notifying the office, you may be dismissed from the clinic at the provider's discretion.      For prescription refill requests, have your pharmacy contact our office and allow 72 hours for refills to be completed.    Today you received the following chemotherapy and/or immunotherapy agents: Mvasi, Leucovorin, 5FU      To help prevent nausea and vomiting after your treatment, we encourage you to take your nausea medication as directed.  BELOW ARE SYMPTOMS THAT SHOULD BE REPORTED IMMEDIATELY: *FEVER GREATER THAN 100.4 F (38 C) OR HIGHER *CHILLS OR SWEATING *NAUSEA AND VOMITING THAT IS NOT CONTROLLED WITH YOUR NAUSEA MEDICATION *UNUSUAL SHORTNESS OF BREATH *UNUSUAL BRUISING OR BLEEDING *URINARY PROBLEMS (pain or burning when urinating, or frequent urination) *BOWEL PROBLEMS (unusual diarrhea, constipation, pain near the anus) TENDERNESS IN MOUTH AND THROAT WITH OR WITHOUT PRESENCE OF ULCERS (sore throat, sores in mouth, or a toothache) UNUSUAL RASH, SWELLING OR PAIN  UNUSUAL VAGINAL DISCHARGE OR ITCHING   Items with * indicate a potential emergency and should be followed up as soon as possible or go to the Emergency Department if any problems should occur.  Please show the CHEMOTHERAPY ALERT CARD or IMMUNOTHERAPY ALERT  CARD at check-in to the Emergency Department and triage nurse.  Should you have questions after your visit or need to cancel or reschedule your appointment, please contact [place] [facility]  Dept: [phone]  and follow the prompts.  Office hours are 8:00 a.m. to 4:30 p.m. Monday - Friday. Please note that voicemails left after 4:00 p.m. may not be returned until the following business day.  We are closed weekends and major holidays. You have access to a nurse at all times for urgent questions. Please call the main number to the clinic Dept: [phone] and follow the prompts.   For any non-urgent questions, you may also contact your provider using MyChart. We now offer e-Visits for anyone 62 and older to request care online for non-urgent symptoms. For details visit [email or web address].   Also download the MyChart app! Go to the app store, search "MyChart", open the app, select , and log in with your MyChart username and password.

## 2022-10-22 ENCOUNTER — Telehealth: Payer: Self-pay

## 2022-10-22 ENCOUNTER — Inpatient Hospital Stay: Payer: Medicare HMO

## 2022-10-22 DIAGNOSIS — C7800 Secondary malignant neoplasm of unspecified lung: Secondary | ICD-10-CM | POA: Diagnosis not present

## 2022-10-22 DIAGNOSIS — Z452 Encounter for adjustment and management of vascular access device: Secondary | ICD-10-CM | POA: Diagnosis not present

## 2022-10-22 DIAGNOSIS — Z5111 Encounter for antineoplastic chemotherapy: Secondary | ICD-10-CM | POA: Diagnosis not present

## 2022-10-22 DIAGNOSIS — Z5112 Encounter for antineoplastic immunotherapy: Secondary | ICD-10-CM | POA: Diagnosis not present

## 2022-10-22 DIAGNOSIS — R197 Diarrhea, unspecified: Secondary | ICD-10-CM | POA: Diagnosis not present

## 2022-10-22 DIAGNOSIS — C184 Malignant neoplasm of transverse colon: Secondary | ICD-10-CM | POA: Diagnosis not present

## 2022-10-22 DIAGNOSIS — C189 Malignant neoplasm of colon, unspecified: Secondary | ICD-10-CM

## 2022-10-22 DIAGNOSIS — E876 Hypokalemia: Secondary | ICD-10-CM | POA: Diagnosis not present

## 2022-10-22 DIAGNOSIS — R630 Anorexia: Secondary | ICD-10-CM | POA: Diagnosis not present

## 2022-10-22 MED ORDER — HEPARIN SOD (PORK) LOCK FLUSH 100 UNIT/ML IV SOLN
500.0000 [IU] | Freq: Once | INTRAVENOUS | Status: AC | PRN
  Administered 2022-10-22: 500 [IU]

## 2022-10-22 MED ORDER — SODIUM CHLORIDE 0.9% FLUSH
10.0000 mL | INTRAVENOUS | Status: DC | PRN
  Administered 2022-10-22: 10 mL

## 2022-10-22 NOTE — Telephone Encounter (Signed)
Pt call wanting to make sure he does not need a port flush w/lab appt on the day of treatment 11/17/2022.  Pt stated he spoke with Peggy in [facility] who scheduled him for his CT Scan on 11/15/2022.  Informed pt that he's correct he will not need a port flush w/lab appt on 11/17/2022 d/t pt will have labs drawn on 11/15/2022 prior to his CT Scan.  Informed pt that Peggy corrected the appt schedule.  Pt had no further questions or concerns.

## 2022-10-26 NOTE — Progress Notes (Addendum)
[facility]   Telephone:(336) [phone] Fax:(336) [phone]   Clinic Follow up Note   Patient Care Team: Noni Saupe, MD as PCP - General (Family Medicine) [name], DO as PCP - Cardiology (Cardiology) Malachy Mood, MD as Attending Physician (Hematology and Oncology)  Date of Service:  11/03/2022  CHIEF COMPLAINT: f/u of metastatic colon cancer    CURRENT THERAPY:   - Maintenance Xeloda, will change back to 5-fu pump infusion in 3 weeks  -Bevacizumab  ASSESSMENT: Thomas Knight is a 77 y.o. male with   Cancer of right colon (HCC) -stage II (pT3, N0), metastatic disease to lung and possible nodes in July 2023, MSS, KRAS/NRAS/BRAF wild type   -found on screening colonoscopy. S/p right hemicolectomy 01/09/21. -lung metastasis confirmed in RUL by bronchoscopy on 02/09/22. -FoundationOne showed MSS, low tumor burden, KRAS/NRAS/BRAF wild-type, no other targetable mutations. The benefit of EGFR inhibitor is much less in right-sided colon cancer, so I will not give it as first line but may consider in subsequent lines.  -he started FOLFOX on 03/03/22. He tolerated well overall with diarrhea for several days. Bevacizumab added with C2 (9/20).  -CT scan 05/26/2022 showed improved lung mets, but showed new infiltrative lung change, possible related to his recent URI -repeated CT chest on 07/26/2022 showed stable known lung mets (overall excellent response, with minimum residual dsease) and resolved previously see inflammatory change in RLL -I have changed treatment to maintenance xeloda and beva every 3 weeks, he started C1 on 08/18/2022.  -due to recurrent diarrhea, I stopped Xeloda after 2 cycles -we have changed his treatment to 5-fu pump infusion and beva every 2 weeks on 4/24, he tolerated reasonably well, diarrhea has resolved now. -He is scheduled for restaging CT scan in few weeks -Will proceed with treatment today at 4 dose, and continue every 2 weeks. -Depending on his  response to treatment, we discussed option of hold chemo if he has CR on CT   Leg edema, diarrhea,  -He noticed bilateral ankle swelling, which goes down in the morning.  I encouraged him to elevate legs and wear compression stocks -He has had chronic diarrhea since he started the Xeloda, improved lately, continue Imodium and Lomotil -I recommend him to use topical Voltaren for his left shoulder pain   PLAN: -Recommend  wearing compression socks and elevating his feet when not mobile due to edema in the lower extremities -CT CAP schedule 5/20 -lab reviewed -Continue Potassium, increase to twice daily, 1 tablet in the AM,1 in the PM -proceed with Avastin/Leuco/5FU pump at full dose today  -lab/flush and f/u -5/22    SUMMARY OF ONCOLOGIC HISTORY: Oncology History Overview Note   Cancer Staging  Cancer of right colon [facility]) Staging form: Colon and Rectum, AJCC 8th Edition - Pathologic stage from 01/09/2021: Stage IIA (pT3, pN0, cM0) - Signed by Malachy Mood, MD on 02/12/2022 Total positive nodes: 0 Histologic grading system: 4 grade system Histologic grade (G): G2 Residual tumor (R): R0 - None     Cancer of right colon (HCC)  10/2020 Procedure   Colonoscopy-Dr. Elnoria Howard     11/06/2020 Imaging   CT CHEST ABDOMEN PELVIS W CONTRAST   IMPRESSION: 1. 4.6 cm partially circumferential apple-core type neoplastic process involving the mid transverse colon. No findings for locoregional adenopathy or distant metastatic disease. 2. Enlarged prostate gland with median lobe hypertrophy impressing on the base of the bladder. 3. 16 mm left thyroid nodule. Recommend follow-up thyroid ultrasound examination.   01/09/2021  Initial Diagnosis   Colon cancer (HCC)   01/09/2021 Pathology Results   B. COLON, RIGHT, RESECTION:  -  Adenocarcinoma, moderately differentiated, 3.0 cm  -  No carcinoma identified in seventeen lymph nodes (0/17)  -  Margins uninvolved by carcinoma  -  Tubular adenoma (x2)  -   Benign appendix  -  See oncology table and comment below   Margin Status for Invasive Carcinoma: All margins negative for  invasive carcinoma       Distance from Invasive Carcinoma to Radial (Circumferential)       Margin Status for Non-Invasive Tumor: All margins negative for  high-grade dysplasia / intramucosal carcinoma and low-grade dysplasia  Regional Lymph Nodes:       Number of Lymph Nodes with Tumor: 0       Number of Lymph Nodes Examined: 17  Tumor Deposits: 0  Distant Metastasis:       Distant Site(s) Involved: Not applicable  Pathologic Stage Classification (pTNM, AJCC 8th Edition): pT3, pN0   MMR Stable    01/09/2021 Cancer Staging   Staging form: Colon and Rectum, AJCC 8th Edition - Pathologic stage from 01/09/2021: Stage IIA (pT3, pN0, cM0) - Signed by Malachy Mood, MD on 02/12/2022 Total positive nodes: 0 Histologic grading system: 4 grade system Histologic grade (G): G2 Residual tumor (R): R0 - None   05/2021 Tumor Marker   Patient's tumor was tested for the following markers: CEA. Results of the tumor marker test revealed 2.1.   12/11/2021 Procedure   Colonoscopy-Dr. Elnoria Howard  There was evidence of a prior functional end-to-end ileo-colonic anastomosis in the transverse colon. This was patent and was characterized by healthy appearing mucosa. The anastomosis was traversed. Findings: Scattered small and large-mouthed diverticula were found in the sigmoid colon. - Patent functional end-to-end ileo-colonic anastomosis, characterized by healthy appearing mucosa. - Diverticulosis in the sigmoid colon. - No specimens collected.   01/25/2022 Imaging   CT CHEST ABDOMEN PELVIS W CONTRAST   IMPRESSION: 1. New right-sided pulmonary nodules measure up to 11 mm, nonspecific but concerning for metastatic disease. Consider further evaluation with nuclear medicine PET/CT. 2. New nodularity/lymph nodes in the central mesentery, concerning for nodal disease involvement. 3.  Slightly increased size of prominent retroperitoneal lymph nodes, nonspecific but also somewhat concerning for nodal disease involvement. 4. Surgical change of partial colectomy without evidence of local recurrence. 5. Stable prominent mediastinal lymph nodes, nonspecific but stability is reassuring. Attention on follow-up imaging suggested. 6. Stable prominent right external iliac lymph node, nonspecific but stability is reassuring. Attention on follow-up imaging suggested. 7. Stable hypodense 8 mm hepatic lesion technically too small to accurately characterize but stability is reassuring. Attention on follow-up imaging suggested. 8. 1.9 cm incidental left thyroid nodule. Recommend thyroid US. Reference: J Am Coll Radiol. 2015 Feb;12(2): 143-50 9.  Aortic Atherosclerosis (ICD10-I70.0).   02/09/2022 Procedure   Bronchoscopy under the care of Dr. Tonia Brooms    02/09/2022 Pathology Results   FINAL MICROSCOPIC DIAGNOSIS:   A. LUNG, RUL, FINE NEEDLE ASPIRATION  BIOPSY FORCEPS:  Adenocarcinoma with morphologic features consistent with metastasis from a colorectal primary.  Please see comment.   Comment: The following immunostains are performed with appropriate controls :  TTF-1: Negative .  Napsin A: Negative .  CK7: Negative.  CK20: Positive.  CDX2: Positive .   The above immuno histochemical profile is supportive of the rendered diagnosis.    08/18/2022 - 08/18/2022 Chemotherapy   Patient is on Treatment Plan : COLORECTAL Bevacizumab q14d  Colon cancer metastasized to lung [facility])  02/09/2022 Pathology Results   FINAL MICROSCOPIC DIAGNOSIS:   A. LUNG, RUL, FINE NEEDLE ASPIRATION  BIOPSY FORCEPS:  Adenocarcinoma with morphologic features consistent with metastasis from a colorectal primary.  Please see comment.   Comment: The following immunostains are performed with appropriate controls :  TTF-1: Negative .  Napsin A: Negative .  CK7: Negative.  CK20: Positive.  CDX2: Positive  .   The above immuno histochemical profile is supportive of the rendered diagnosis.    02/12/2022 Initial Diagnosis   Colon cancer metastasized to lung (HCC)   03/03/2022 - 07/31/2022 Chemotherapy   Patient is on Treatment Plan : COLORECTAL FOLFOX + Bevacizumab q14d     07/26/2022 Imaging    IMPRESSION: 1. Near-complete interval resolution of nodular and masslike areas of irregular consolidation, likely resolving infection or organizing pneumonia/drug reaction. 2. Additional previously seen pulmonary nodules are stable, consistent with treated pulmonary metastatic disease. 3. Mild peribronchovascular reticular opacities which are most pronounced in the mid and lower lungs, similar to prior exam, progressed when compared with prior dated January 15, 2022. Findings can be seen in the setting of interstitial lung disease, pattern is most consistent with fibrotic NSIP. 4. Aortic Atherosclerosis (ICD10-I70.0).   07/26/2022 Imaging    IMPRESSION: 1. Near-complete interval resolution of nodular and masslike areas of irregular consolidation, likely resolving infection or organizing pneumonia/drug reaction. 2. Additional previously seen pulmonary nodules are stable, consistent with treated pulmonary metastatic disease. 3. Mild peribronchovascular reticular opacities which are most pronounced in the mid and lower lungs, similar to prior exam, progressed when compared with prior dated January 15, 2022. Findings can be seen in the setting of interstitial lung disease, pattern is most consistent with fibrotic NSIP. 4. Aortic Atherosclerosis (ICD10-I70.0).   09/08/2022 - 09/08/2022 Chemotherapy   Patient is on Treatment Plan : COLORECTAL Bevacizumab q21d     10/20/2022 -  Chemotherapy   Patient is on Treatment Plan : COLORECTAL 5FU + Leucovorin (Modified [place]) + Bevacizumab q14d        INTERVAL HISTORY:  [employer] is here for a follow up of metastatic colon cancer . He was last seen by  me on 10/20/2022. He presents to the clinic accompanied by wife. Pt report his Bm issue has gotten a lot better and he has managed it with using Imodium. Pt state that he has edema in his lower extremity. He also notice of joint pain in his left shoulder. Pt appetite is getting better.     All other systems were reviewed with the patient and are negative.  MEDICAL HISTORY:  Past Medical History:  Diagnosis Date   Bilateral leg edema 08/24/2019   Cancer (HCC)    skin ca, colon   Chronic cough    Chronic pain of left knee 12/01/2015   COVID-19    06/2019   Dysrhythmia    afib  was cardioverted   Essential hypertension 08/24/2019   Mixed hyperlipidemia 08/24/2019   Obesity (BMI 30-39.9) 08/24/2019   Persistent atrial fibrillation (HCC) 08/24/2019    SURGICAL HISTORY: Past Surgical History:  Procedure Laterality Date   ACHILLES TENDON REPAIR Left 2012   Ruptured   BRONCHIAL BIOPSY  02/09/2022   Procedure: BRONCHIAL BIOPSIES;  Surgeon: Josephine Igo, DO;  Location: [facility];  Service: Pulmonary;;   BRONCHIAL NEEDLE ASPIRATION BIOPSY  02/09/2022   Procedure: BRONCHIAL NEEDLE ASPIRATION BIOPSIES;  Surgeon: Josephine Igo, DO;  Location: [facility];  Service: Pulmonary;;   COLONOSCOPY  WITH PROPOFOL N/A 12/11/2021   Procedure: COLONOSCOPY WITH PROPOFOL;  Surgeon: Jeani Hawking, MD;  Location: [facility];  Service: Gastroenterology;  Laterality: N/A;   HERNIA REPAIR     umbilical hernia   IR IMAGING GUIDED PORT INSERTION  02/18/2022   SKIN SURGERY Left    Basal cell carcinoma on left forearm   XI ROBOTIC ASSISTED LOWER ANTERIOR RESECTION N/A 01/09/2021   Procedure: XI ROBOTIC ASSISTED RIGHT HEMI COLECTOMY, LYSIS OF ADHESIONS, SMALL BOWEL RESECTION, INTRAOPERATIVE ASSESMENT OF PERFUSION, PARTIAL OMENTECTOMY;  Surgeon: Andria Meuse, MD;  Location: [facility];  Service: General;  Laterality: N/A;    I have reviewed the social history and family history with the patient and  they are unchanged from previous note.  ALLERGIES:  is allergic to furosemide and tramadol.  MEDICATIONS:  Current Outpatient Medications  Medication Sig Dispense Refill   acetaminophen (TYLENOL) 500 MG tablet Take 500-1,000 mg by mouth every 6 (six) hours as needed (pain.).     amLODipine (NORVASC) 10 MG tablet Take 1 tablet (10 mg total) by mouth daily. 30 tablet 1   Ascorbic Acid (VITAMIN C WITH ROSE HIPS) 500 MG tablet Take 500 mg by mouth every evening.     Bacillus Coagulans-Inulin (ALIGN PREBIOTIC-PROBIOTIC PO) Take by mouth.     Cholecalciferol (D3-1000) 25 MCG (1000 UT) capsule Take 1,000 Units by mouth every evening.     diphenoxylate-atropine (LOMOTIL) 2.5-0.025 MG tablet Take 1 tablet by mouth 4 (four) times daily as needed for diarrhea or loose stools. 30 tablet 3   ELIQUIS 5 MG TABS tablet Take 1 tablet (5 mg total) by mouth 2 (two) times daily. 180 tablet 1   metoprolol tartrate (LOPRESSOR) 25 MG tablet Take 1 tablet (25 mg total) by mouth 2 (two) times daily. 180 tablet 3   Misc Natural Products (JOINT SUPPORT PO) Take 1 tablet by mouth daily. Instaflex Advanced Joint Support     nitroGLYCERIN (NITROSTAT) 0.4 MG SL tablet Place 1 tablet (0.4 mg total) under the tongue every 5 (five) minutes as needed. 25 tablet 7   potassium chloride SA (KLOR-CON M) 20 MEQ tablet Take 1 tablet (20 mEq total) by mouth daily. 30 tablet 1   sacubitril-valsartan (ENTRESTO) 24-26 MG Take 1 tablet by mouth 2 (two) times daily. 180 tablet 3   spironolactone (ALDACTONE) 50 MG tablet Take 1 tablet (50 mg total) by mouth daily. 10 tablet 0   vitamin B-12 (CYANOCOBALAMIN) 1000 MCG tablet Take 1,000 mcg by mouth every evening.     Zinc 50 MG CAPS Take 50 mg by mouth every evening.     No current facility-administered medications for this visit.    PHYSICAL EXAMINATION: ECOG PERFORMANCE STATUS: 1 - Symptomatic but completely ambulatory  Vitals:   11/03/22 0810  BP: (!) 151/88  Pulse: (!) 56  Resp:  17  Temp: 98.8 F (37.1 C)  SpO2: 98%   Wt Readings from Last 3 Encounters:  11/03/22 221 lb 12.8 oz (100.6 kg)  10/20/22 216 lb 9.6 oz (98.2 kg)  09/29/22 216 lb (98 kg)     GENERAL:alert, no distress and comfortable SKIN: skin color normal, no rashes or significant lesions EYES: normal, Conjunctiva are pink and non-injected, sclera clear  NEURO: alert & oriented x 3 with fluent speech   LABORATORY DATA:  I have reviewed the data as listed    Latest Ref Rng & Units 11/03/2022    7:49 AM 10/20/2022    8:06 AM 09/29/2022  8:13 AM  CBC  WBC 4.0 - 10.5 K/uL 5.1  4.6  4.5   Hemoglobin 13.0 - 17.0 g/dL 16.1  09.6  04.5   Hematocrit 39.0 - 52.0 % 39.0  39.8  42.9   Platelets 150 - 400 K/uL 146  130  154         Latest Ref Rng & Units 11/03/2022    7:49 AM 10/20/2022    8:06 AM 09/29/2022    8:13 AM  CMP  Glucose 70 - 99 mg/dL 409  93  811   BUN 8 - 23 mg/dL 6  6  31    Creatinine 0.61 - 1.24 mg/dL 9.14  7.82  9.56   Sodium 135 - 145 mmol/L 143  143  131   Potassium 3.5 - 5.1 mmol/L 3.0  3.0  2.5   Chloride 98 - 111 mmol/L 108  108  96   CO2 22 - 32 mmol/L 30  29  24    Calcium 8.9 - 10.3 mg/dL 8.3  8.5  8.5   Total Protein 6.5 - 8.1 g/dL 6.0  5.9  6.3   Total Bilirubin 0.3 - 1.2 mg/dL 0.7  0.7  1.1   Alkaline Phos 38 - 126 U/L 51  44  46   AST 15 - 41 U/L 16  17  26    ALT 0 - 44 U/L 9  11  15        RADIOGRAPHIC STUDIES: I have personally reviewed the radiological images as listed and agreed with the findings in the report. No results found.    Orders Placed This Encounter  Procedures   CBC with Differential (Cancer Center Only)    Standing Status:   Future    Standing Expiration Date:   12/01/2023   CMP (Cancer Center only)    Standing Status:   Future    Standing Expiration Date:   12/01/2023   All questions were answered. The patient knows to call the clinic with any problems, questions or concerns. No barriers to learning was detected. The total time spent in the  appointment was 25 minutes.     Malachy Mood, MD 11/03/2022   Carolin Coy, CMA, am acting as scribe for Malachy Mood, MD.   I have reviewed the above documentation for accuracy and completeness, and I agree with the above.

## 2022-11-03 ENCOUNTER — Encounter: Payer: Self-pay | Admitting: Hematology

## 2022-11-03 ENCOUNTER — Inpatient Hospital Stay: Payer: Medicare HMO

## 2022-11-03 ENCOUNTER — Inpatient Hospital Stay: Payer: Medicare HMO | Attending: Physician Assistant | Admitting: Hematology

## 2022-11-03 VITALS — BP 151/88 | HR 56 | Temp 98.8°F | Resp 17 | Wt 221.8 lb

## 2022-11-03 DIAGNOSIS — Z452 Encounter for adjustment and management of vascular access device: Secondary | ICD-10-CM | POA: Diagnosis not present

## 2022-11-03 DIAGNOSIS — C182 Malignant neoplasm of ascending colon: Secondary | ICD-10-CM

## 2022-11-03 DIAGNOSIS — C189 Malignant neoplasm of colon, unspecified: Secondary | ICD-10-CM

## 2022-11-03 DIAGNOSIS — E860 Dehydration: Secondary | ICD-10-CM

## 2022-11-03 DIAGNOSIS — C184 Malignant neoplasm of transverse colon: Secondary | ICD-10-CM | POA: Diagnosis present

## 2022-11-03 DIAGNOSIS — C7800 Secondary malignant neoplasm of unspecified lung: Secondary | ICD-10-CM | POA: Diagnosis not present

## 2022-11-03 DIAGNOSIS — K5730 Diverticulosis of large intestine without perforation or abscess without bleeding: Secondary | ICD-10-CM | POA: Diagnosis not present

## 2022-11-03 DIAGNOSIS — R600 Localized edema: Secondary | ICD-10-CM | POA: Insufficient documentation

## 2022-11-03 DIAGNOSIS — Z5112 Encounter for antineoplastic immunotherapy: Secondary | ICD-10-CM | POA: Insufficient documentation

## 2022-11-03 DIAGNOSIS — Z5111 Encounter for antineoplastic chemotherapy: Secondary | ICD-10-CM | POA: Insufficient documentation

## 2022-11-03 LAB — CMP (CANCER CENTER ONLY)
ALT: 9 U/L (ref 0–44)
AST: 16 U/L (ref 15–41)
Albumin: 3.4 g/dL — ABNORMAL LOW (ref 3.5–5.0)
Alkaline Phosphatase: 51 U/L (ref 38–126)
Anion gap: 5 (ref 5–15)
BUN: 6 mg/dL — ABNORMAL LOW (ref 8–23)
CO2: 30 mmol/L (ref 22–32)
Calcium: 8.3 mg/dL — ABNORMAL LOW (ref 8.9–10.3)
Chloride: 108 mmol/L (ref 98–111)
Creatinine: 0.76 mg/dL (ref 0.61–1.24)
GFR, Estimated: >60 mL/min (ref >60)
Glucose, Bld: 103 mg/dL — ABNORMAL HIGH (ref 70–99)
Potassium: 3 mmol/L — ABNORMAL LOW (ref 3.5–5.1)
Sodium: 143 mmol/L (ref 135–145)
Total Bilirubin: 0.7 mg/dL (ref 0.3–1.2)
Total Protein: 6 g/dL — ABNORMAL LOW (ref 6.5–8.1)

## 2022-11-03 LAB — CBC WITH DIFFERENTIAL (CANCER CENTER ONLY)
Abs Immature Granulocytes: 0.01 10*3/uL (ref 0.00–0.07)
Basophils Absolute: 0.1 10*3/uL (ref 0.0–0.1)
Basophils Relative: 1 %
Eosinophils Absolute: 0.1 10*3/uL (ref 0.0–0.5)
Eosinophils Relative: 2 %
HCT: 39 % (ref 39.0–52.0)
Hemoglobin: 13.2 g/dL (ref 13.0–17.0)
Immature Granulocytes: 0 %
Lymphocytes Relative: 31 %
Lymphs Abs: 1.6 10*3/uL (ref 0.7–4.0)
MCH: 35.1 pg — ABNORMAL HIGH (ref 26.0–34.0)
MCHC: 33.8 g/dL (ref 30.0–36.0)
MCV: 103.7 fL — ABNORMAL HIGH (ref 80.0–100.0)
Monocytes Absolute: 0.7 10*3/uL (ref 0.1–1.0)
Monocytes Relative: 14 %
Neutro Abs: 2.6 10*3/uL (ref 1.7–7.7)
Neutrophils Relative %: 52 %
Platelet Count: 146 10*3/uL — ABNORMAL LOW (ref 150–400)
RBC: 3.76 MIL/uL — ABNORMAL LOW (ref 4.22–5.81)
RDW: 16.9 % — ABNORMAL HIGH (ref 11.5–15.5)
WBC Count: 5.1 10*3/uL (ref 4.0–10.5)
nRBC: 0 % (ref 0.0–0.2)

## 2022-11-03 MED ORDER — PROCHLORPERAZINE MALEATE 10 MG PO TABS
10.0000 mg | ORAL_TABLET | Freq: Once | ORAL | Status: AC
  Administered 2022-11-03: 10 mg via ORAL
  Filled 2022-11-03: qty 1

## 2022-11-03 MED ORDER — SODIUM CHLORIDE 0.9% FLUSH: 10.0000 mL | INTRAVENOUS | Status: DC | PRN

## 2022-11-03 MED ORDER — SODIUM CHLORIDE 0.9 % IV SOLN: Freq: Once | INTRAVENOUS | Status: AC

## 2022-11-03 MED ORDER — HEPARIN SOD (PORK) LOCK FLUSH 100 UNIT/ML IV SOLN: 500.0000 [IU] | Freq: Once | INTRAVENOUS | Status: DC | PRN

## 2022-11-03 MED ORDER — SODIUM CHLORIDE 0.9% FLUSH
10.0000 mL | Freq: Once | INTRAVENOUS | Status: AC
  Administered 2022-11-03: 10 mL

## 2022-11-03 MED ORDER — SODIUM CHLORIDE 0.9 % IV SOLN
5.0000 mg/kg | Freq: Once | INTRAVENOUS | Status: AC
  Administered 2022-11-03: 500 mg via INTRAVENOUS
  Filled 2022-11-03: qty 4

## 2022-11-03 MED ORDER — SODIUM CHLORIDE 0.9 % IV SOLN
400.0000 mg/m2 | Freq: Once | INTRAVENOUS | Status: AC
  Administered 2022-11-03: 888 mg via INTRAVENOUS
  Filled 2022-11-03: qty 44.4

## 2022-11-03 MED ORDER — SODIUM CHLORIDE 0.9 % IV SOLN
2400.0000 mg/m2 | INTRAVENOUS | Status: DC
  Administered 2022-11-03: 5000 mg via INTRAVENOUS
  Filled 2022-11-03: qty 100

## 2022-11-03 NOTE — Assessment & Plan Note (Signed)
-  stage II (pT3, N0), metastatic disease to lung and possible nodes in July 2023, MSS, KRAS/NRAS/BRAF wild type   -found on screening colonoscopy. S/p right hemicolectomy 01/09/21. -lung metastasis confirmed in RUL by bronchoscopy on 02/09/22. -FoundationOne showed MSS, low tumor burden, KRAS/NRAS/BRAF wild-type, no other targetable mutations. The benefit of EGFR inhibitor is much less in right-sided colon cancer, so I will not give it as first line but may consider in subsequent lines.  -he started FOLFOX on 03/03/22. He tolerated well overall with diarrhea for several days. Bevacizumab added with C2 (9/20).  -CT scan 05/26/2022 showed improved lung mets, but showed new infiltrative lung change, possible related to his recent URI -repeated CT chest on 07/26/2022 showed stable known lung mets (overall excellent response, with minimum residual dsease) and resolved previously see inflammatory change in RLL -I have changed treatment to maintenance xeloda and beva every 3 weeks, he started C1 on 08/18/2022.  -due to recurrent diarrhea, I stopped Xeloda after 2 cycles -we have changed his treatment to 5-fu pump infusion and beva every 2 weeks on 4/24

## 2022-11-03 NOTE — Patient Instructions (Signed)
[place] [facility]  Discharge Instructions: Thank you for choosing [facility] to provide your oncology and hematology care.   If you have a lab appointment with the Cancer Center, please go directly to the Cancer Center and check in at the registration area.   Wear comfortable clothing and clothing appropriate for easy access to any Portacath or PICC line.   We strive to give you quality time with your provider. You may need to reschedule your appointment if you arrive late (15 or more minutes).  Arriving late affects you and other patients whose appointments are after yours.  Also, if you miss three or more appointments without notifying the office, you may be dismissed from the clinic at the provider's discretion.      For prescription refill requests, have your pharmacy contact our office and allow 72 hours for refills to be completed.    Today you received the following chemotherapy and/or immunotherapy agents MVASI, leucovorin, fluorouracil      To help prevent nausea and vomiting after your treatment, we encourage you to take your nausea medication as directed.  BELOW ARE SYMPTOMS THAT SHOULD BE REPORTED IMMEDIATELY: *FEVER GREATER THAN 100.4 F (38 C) OR HIGHER *CHILLS OR SWEATING *NAUSEA AND VOMITING THAT IS NOT CONTROLLED WITH YOUR NAUSEA MEDICATION *UNUSUAL SHORTNESS OF BREATH *UNUSUAL BRUISING OR BLEEDING *URINARY PROBLEMS (pain or burning when urinating, or frequent urination) *BOWEL PROBLEMS (unusual diarrhea, constipation, pain near the anus) TENDERNESS IN MOUTH AND THROAT WITH OR WITHOUT PRESENCE OF ULCERS (sore throat, sores in mouth, or a toothache) UNUSUAL RASH, SWELLING OR PAIN  UNUSUAL VAGINAL DISCHARGE OR ITCHING   Items with * indicate a potential emergency and should be followed up as soon as possible or go to the Emergency Department if any problems should occur.  Please show the CHEMOTHERAPY ALERT CARD or  IMMUNOTHERAPY ALERT CARD at check-in to the Emergency Department and triage nurse.  Should you have questions after your visit or need to cancel or reschedule your appointment, please contact [place] [facility]  Dept: [phone]  and follow the prompts.  Office hours are 8:00 a.m. to 4:30 p.m. Monday - Friday. Please note that voicemails left after 4:00 p.m. may not be returned until the following business day.  We are closed weekends and major holidays. You have access to a nurse at all times for urgent questions. Please call the main number to the clinic Dept: [phone] and follow the prompts.   For any non-urgent questions, you may also contact your provider using MyChart. We now offer e-Visits for anyone 31 and older to request care online for non-urgent symptoms. For details visit [email or web address].   Also download the MyChart app! Go to the app store, search "MyChart", open the app, select [place], and log in with your MyChart username and password.

## 2022-11-03 NOTE — Progress Notes (Signed)
Confirmed 5 FU pump at 5000 mg for today.  T.O. Dr Audie Clear, PharmD

## 2022-11-04 ENCOUNTER — Other Ambulatory Visit: Payer: Self-pay

## 2022-11-04 ENCOUNTER — Encounter: Payer: Self-pay | Admitting: *Deleted

## 2022-11-04 NOTE — Progress Notes (Signed)
Patient called to report his ambulatory pump was beeping and giving an error that the line was clamped. They turned the pump off around midnight. Pump tested this morning and restarted at 1138am. Patient advised to call the office or the number on the pump if any other problems occur. Patient has been rescheduled for Saturday morning at 930 for pump d/c due to the break in the administration of his chemotherapy. Patient confirms this appt time.

## 2022-11-05 ENCOUNTER — Inpatient Hospital Stay: Payer: Medicare HMO

## 2022-11-05 ENCOUNTER — Other Ambulatory Visit (HOSPITAL_COMMUNITY): Payer: Self-pay

## 2022-11-06 ENCOUNTER — Inpatient Hospital Stay: Payer: Medicare HMO

## 2022-11-06 VITALS — BP 163/93 | HR 128 | Temp 98.3°F | Resp 16

## 2022-11-06 DIAGNOSIS — Z5112 Encounter for antineoplastic immunotherapy: Secondary | ICD-10-CM | POA: Diagnosis not present

## 2022-11-06 DIAGNOSIS — Z452 Encounter for adjustment and management of vascular access device: Secondary | ICD-10-CM | POA: Diagnosis not present

## 2022-11-06 DIAGNOSIS — Z5111 Encounter for antineoplastic chemotherapy: Secondary | ICD-10-CM | POA: Diagnosis not present

## 2022-11-06 DIAGNOSIS — R600 Localized edema: Secondary | ICD-10-CM | POA: Diagnosis not present

## 2022-11-06 DIAGNOSIS — C7800 Secondary malignant neoplasm of unspecified lung: Secondary | ICD-10-CM | POA: Diagnosis not present

## 2022-11-06 DIAGNOSIS — K5730 Diverticulosis of large intestine without perforation or abscess without bleeding: Secondary | ICD-10-CM | POA: Diagnosis not present

## 2022-11-06 DIAGNOSIS — C184 Malignant neoplasm of transverse colon: Secondary | ICD-10-CM | POA: Diagnosis not present

## 2022-11-06 DIAGNOSIS — C182 Malignant neoplasm of ascending colon: Secondary | ICD-10-CM | POA: Diagnosis not present

## 2022-11-06 MED ORDER — SODIUM CHLORIDE 0.9% FLUSH
10.0000 mL | INTRAVENOUS | Status: DC | PRN
  Administered 2022-11-06: 10 mL

## 2022-11-06 MED ORDER — HEPARIN SOD (PORK) LOCK FLUSH 100 UNIT/ML IV SOLN
500.0000 [IU] | Freq: Once | INTRAVENOUS | Status: AC | PRN
  Administered 2022-11-06: 500 [IU]

## 2022-11-15 ENCOUNTER — Inpatient Hospital Stay: Payer: Medicare HMO

## 2022-11-15 ENCOUNTER — Ambulatory Visit (HOSPITAL_COMMUNITY)
Admission: RE | Admit: 2022-11-15 | Discharge: 2022-11-15 | Disposition: A | Discharge status: Home / Self Care | Payer: Medicare HMO | Source: Ambulatory Visit | Attending: Hematology | Admitting: Hematology

## 2022-11-15 DIAGNOSIS — Z5111 Encounter for antineoplastic chemotherapy: Secondary | ICD-10-CM | POA: Diagnosis not present

## 2022-11-15 DIAGNOSIS — K5730 Diverticulosis of large intestine without perforation or abscess without bleeding: Secondary | ICD-10-CM | POA: Diagnosis not present

## 2022-11-15 DIAGNOSIS — C7800 Secondary malignant neoplasm of unspecified lung: Secondary | ICD-10-CM | POA: Diagnosis not present

## 2022-11-15 DIAGNOSIS — R918 Other nonspecific abnormal finding of lung field: Secondary | ICD-10-CM | POA: Diagnosis not present

## 2022-11-15 DIAGNOSIS — E860 Dehydration: Secondary | ICD-10-CM

## 2022-11-15 DIAGNOSIS — C184 Malignant neoplasm of transverse colon: Secondary | ICD-10-CM | POA: Diagnosis not present

## 2022-11-15 DIAGNOSIS — C182 Malignant neoplasm of ascending colon: Secondary | ICD-10-CM | POA: Insufficient documentation

## 2022-11-15 DIAGNOSIS — C189 Malignant neoplasm of colon, unspecified: Secondary | ICD-10-CM

## 2022-11-15 DIAGNOSIS — Z5112 Encounter for antineoplastic immunotherapy: Secondary | ICD-10-CM | POA: Diagnosis not present

## 2022-11-15 DIAGNOSIS — R600 Localized edema: Secondary | ICD-10-CM | POA: Diagnosis not present

## 2022-11-15 DIAGNOSIS — Z452 Encounter for adjustment and management of vascular access device: Secondary | ICD-10-CM | POA: Diagnosis not present

## 2022-11-15 LAB — CMP (CANCER CENTER ONLY)
ALT: 9 U/L (ref 0–44)
AST: 17 U/L (ref 15–41)
Albumin: 3.5 g/dL (ref 3.5–5.0)
Alkaline Phosphatase: 49 U/L (ref 38–126)
Anion gap: 5 (ref 5–15)
BUN: 7 mg/dL — ABNORMAL LOW (ref 8–23)
CO2: 28 mmol/L (ref 22–32)
Calcium: 8.5 mg/dL — ABNORMAL LOW (ref 8.9–10.3)
Chloride: 107 mmol/L (ref 98–111)
Creatinine: 0.72 mg/dL (ref 0.61–1.24)
GFR, Estimated: >60 mL/min (ref >60)
Glucose, Bld: 98 mg/dL (ref 70–99)
Potassium: 3.4 mmol/L — ABNORMAL LOW (ref 3.5–5.1)
Sodium: 140 mmol/L (ref 135–145)
Total Bilirubin: 0.7 mg/dL (ref 0.3–1.2)
Total Protein: 6.3 g/dL — ABNORMAL LOW (ref 6.5–8.1)

## 2022-11-15 LAB — CBC WITH DIFFERENTIAL (CANCER CENTER ONLY)
Abs Immature Granulocytes: 0.02 10*3/uL (ref 0.00–0.07)
Basophils Absolute: 0.1 10*3/uL (ref 0.0–0.1)
Basophils Relative: 1 %
Eosinophils Absolute: 0.1 10*3/uL (ref 0.0–0.5)
Eosinophils Relative: 1 %
HCT: 40.5 % (ref 39.0–52.0)
Hemoglobin: 13.7 g/dL (ref 13.0–17.0)
Immature Granulocytes: 0 %
Lymphocytes Relative: 28 %
Lymphs Abs: 1.5 10*3/uL (ref 0.7–4.0)
MCH: 34.9 pg — ABNORMAL HIGH (ref 26.0–34.0)
MCHC: 33.8 g/dL (ref 30.0–36.0)
MCV: 103.1 fL — ABNORMAL HIGH (ref 80.0–100.0)
Monocytes Absolute: 0.6 10*3/uL (ref 0.1–1.0)
Monocytes Relative: 12 %
Neutro Abs: 3.2 10*3/uL (ref 1.7–7.7)
Neutrophils Relative %: 58 %
Platelet Count: 144 10*3/uL — ABNORMAL LOW (ref 150–400)
RBC: 3.93 MIL/uL — ABNORMAL LOW (ref 4.22–5.81)
RDW: 16.5 % — ABNORMAL HIGH (ref 11.5–15.5)
WBC Count: 5.6 10*3/uL (ref 4.0–10.5)
nRBC: 0 % (ref 0.0–0.2)

## 2022-11-15 LAB — TOTAL PROTEIN, URINE DIPSTICK: Protein, ur: NEGATIVE mg/dL

## 2022-11-15 MED ORDER — SODIUM CHLORIDE 0.9% FLUSH
10.0000 mL | Freq: Once | INTRAVENOUS | Status: AC | PRN
  Administered 2022-11-15: 10 mL

## 2022-11-15 MED ORDER — HEPARIN SOD (PORK) LOCK FLUSH 100 UNIT/ML IV SOLN
500.0000 [IU] | Freq: Once | INTRAVENOUS | Status: AC | PRN
  Administered 2022-11-15: 500 [IU]

## 2022-11-15 NOTE — Progress Notes (Unsigned)
[facility]   [phone] [phone]   Clinic Follow up Note   Patient Care Team: Noni Saupe, MD as PCP - General (Family Medicine) [name], DO as PCP - Cardiology (Cardiology) Malachy Mood, MD as Attending Physician (Hematology and Oncology)  Date of Service:  11/17/2022  CHIEF COMPLAINT: f/u of  metastatic colon cancer      CURRENT THERAPY:  - Maintenance Xeloda, will change back to 5-fu pump infusion in 3 weeks  -Bevacizumab ASSESSMENT:  Thomas Knight is a 77 y.o. male with   Cancer of right colon (HCC) -stage II (pT3, N0), metastatic disease to lung and possible nodes in July 2023, MSS, KRAS/NRAS/BRAF wild type   -found on screening colonoscopy. S/p right hemicolectomy 01/09/21. -lung metastasis confirmed in RUL by bronchoscopy on 02/09/22. -FoundationOne showed MSS, low tumor burden, KRAS/NRAS/BRAF wild-type, no other targetable mutations. The benefit of EGFR inhibitor is much less in right-sided colon cancer, so I will not give it as first line but may consider in subsequent lines.  -he started FOLFOX on 03/03/22. He tolerated well overall with diarrhea for several days. Bevacizumab added with C2 (9/20).  -CT scan 05/26/2022 showed improved lung mets, but showed new infiltrative lung change, possible related to his recent URI -repeated CT chest on 07/26/2022 showed stable known lung mets (overall excellent response, with minimum residual dsease) and resolved previously see inflammatory change in RLL -I have changed treatment to maintenance xeloda and beva every 3 weeks, he started C1 on 08/18/2022.  -due to recurrent diarrhea, I stopped Xeloda after 2 cycles -we have changed his treatment to 5-fu pump infusion and beva every 2 weeks on 4/24, he tolerated reasonably well, diarrhea has resolved now. -Restaging CT scan yesterday showed stable appearance of previously scattered small pulmonary nodules, measuring 4-95mm, no new lesions.   Overall has much improved compared to his initial CT scan from July 2023, and it is a hard to tell if this is residual disease versus scar tissue.  -He has been on chemotherapy for 8 months now, we discussed the option to hold chemotherapy after 1 more months, or 4 more months, and monitor closely.  Will restart chemo if he has disease progression on future scan.  Patient will think about and let me know next visit  -Due to his daughter's visit in 2 weeks, will postpone his next treatment to 3 weeks.     PLAN: -lab reviewed -reviewed CT scan with pt -proceed with 5Fu and beva, abs are adequate for treatment -I refill Lomotil -deferred treatment 6/5 1 week due to family vacation  SUMMARY OF ONCOLOGIC HISTORY: Oncology History Overview Note   Cancer Staging  Cancer of right colon [facility]) Staging form: Colon and Rectum, AJCC 8th Edition - Pathologic stage from 01/09/2021: Stage IIA (pT3, pN0, cM0) - Signed by Malachy Mood, MD on 02/12/2022 Total positive nodes: 0 Histologic grading system: 4 grade system Histologic grade (G): G2 Residual tumor (R): R0 - None     Cancer of right colon (HCC)  10/2020 Procedure   Colonoscopy-Dr. Elnoria Howard     11/06/2020 Imaging   CT CHEST ABDOMEN PELVIS W CONTRAST   IMPRESSION: 1. 4.6 cm partially circumferential apple-core type neoplastic process involving the mid transverse colon. No findings for locoregional adenopathy or distant metastatic disease. 2. Enlarged prostate gland with median lobe hypertrophy impressing on the base of the bladder. 3. 16 mm left thyroid nodule. Recommend follow-up thyroid ultrasound examination.   01/09/2021 Initial Diagnosis  Colon cancer (HCC)   01/09/2021 Pathology Results   B. COLON, RIGHT, RESECTION:  -  Adenocarcinoma, moderately differentiated, 3.0 cm  -  No carcinoma identified in seventeen lymph nodes (0/17)  -  Margins uninvolved by carcinoma  -  Tubular adenoma (x2)  -  Benign appendix  -  See oncology table  and comment below   Margin Status for Invasive Carcinoma: All margins negative for  invasive carcinoma       Distance from Invasive Carcinoma to Radial (Circumferential)       Margin Status for Non-Invasive Tumor: All margins negative for  high-grade dysplasia / intramucosal carcinoma and low-grade dysplasia  Regional Lymph Nodes:       Number of Lymph Nodes with Tumor: 0       Number of Lymph Nodes Examined: 17  Tumor Deposits: 0  Distant Metastasis:       Distant Site(s) Involved: Not applicable  Pathologic Stage Classification (pTNM, AJCC 8th Edition): pT3, pN0   MMR Stable    01/09/2021 Cancer Staging   Staging form: Colon and Rectum, AJCC 8th Edition - Pathologic stage from 01/09/2021: Stage IIA (pT3, pN0, cM0) - Signed by Malachy Mood, MD on 02/12/2022 Total positive nodes: 0 Histologic grading system: 4 grade system Histologic grade (G): G2 Residual tumor (R): R0 - None   05/2021 Tumor Marker   Patient's tumor was tested for the following markers: CEA. Results of the tumor marker test revealed 2.1.   12/11/2021 Procedure   Colonoscopy-Dr. Elnoria Howard  There was evidence of a prior functional end-to-end ileo-colonic anastomosis in the transverse colon. This was patent and was characterized by healthy appearing mucosa. The anastomosis was traversed. Findings: Scattered small and large-mouthed diverticula were found in the sigmoid colon. - Patent functional end-to-end ileo-colonic anastomosis, characterized by healthy appearing mucosa. - Diverticulosis in the sigmoid colon. - No specimens collected.   01/25/2022 Imaging   CT CHEST ABDOMEN PELVIS W CONTRAST   IMPRESSION: 1. New right-sided pulmonary nodules measure up to 11 mm, nonspecific but concerning for metastatic disease. Consider further evaluation with nuclear medicine PET/CT. 2. New nodularity/lymph nodes in the central mesentery, concerning for nodal disease involvement. 3. Slightly increased size of prominent  retroperitoneal lymph nodes, nonspecific but also somewhat concerning for nodal disease involvement. 4. Surgical change of partial colectomy without evidence of local recurrence. 5. Stable prominent mediastinal lymph nodes, nonspecific but stability is reassuring. Attention on follow-up imaging suggested. 6. Stable prominent right external iliac lymph node, nonspecific but stability is reassuring. Attention on follow-up imaging suggested. 7. Stable hypodense 8 mm hepatic lesion technically too small to accurately characterize but stability is reassuring. Attention on follow-up imaging suggested. 8. 1.9 cm incidental left thyroid nodule. Recommend thyroid US. Reference: J Am Coll Radiol. 2015 Feb;12(2): 143-50 9.  Aortic Atherosclerosis (ICD10-I70.0).   02/09/2022 Procedure   Bronchoscopy under the care of Dr. Tonia Brooms    02/09/2022 Pathology Results   FINAL MICROSCOPIC DIAGNOSIS:   A. LUNG, RUL, FINE NEEDLE ASPIRATION  BIOPSY FORCEPS:  Adenocarcinoma with morphologic features consistent with metastasis from a colorectal primary.  Please see comment.   Comment: The following immunostains are performed with appropriate controls :  TTF-1: Negative .  Napsin A: Negative .  CK7: Negative.  CK20: Positive.  CDX2: Positive .   The above immuno histochemical profile is supportive of the rendered diagnosis.    08/18/2022 - 08/18/2022 Chemotherapy   Patient is on Treatment Plan : COLORECTAL Bevacizumab q14d     Colon cancer  metastasized to lung [facility])  02/09/2022 Pathology Results   FINAL MICROSCOPIC DIAGNOSIS:   A. LUNG, RUL, FINE NEEDLE ASPIRATION  BIOPSY FORCEPS:  Adenocarcinoma with morphologic features consistent with metastasis from a colorectal primary.  Please see comment.   Comment: The following immunostains are performed with appropriate controls :  TTF-1: Negative .  Napsin A: Negative .  CK7: Negative.  CK20: Positive.  CDX2: Positive .   The above immuno histochemical  profile is supportive of the rendered diagnosis.    02/12/2022 Initial Diagnosis   Colon cancer metastasized to lung (HCC)   03/03/2022 - 07/31/2022 Chemotherapy   Patient is on Treatment Plan : COLORECTAL FOLFOX + Bevacizumab q14d     07/26/2022 Imaging    IMPRESSION: 1. Near-complete interval resolution of nodular and masslike areas of irregular consolidation, likely resolving infection or organizing pneumonia/drug reaction. 2. Additional previously seen pulmonary nodules are stable, consistent with treated pulmonary metastatic disease. 3. Mild peribronchovascular reticular opacities which are most pronounced in the mid and lower lungs, similar to prior exam, progressed when compared with prior dated January 15, 2022. Findings can be seen in the setting of interstitial lung disease, pattern is most consistent with fibrotic NSIP. 4. Aortic Atherosclerosis (ICD10-I70.0).   07/26/2022 Imaging    IMPRESSION: 1. Near-complete interval resolution of nodular and masslike areas of irregular consolidation, likely resolving infection or organizing pneumonia/drug reaction. 2. Additional previously seen pulmonary nodules are stable, consistent with treated pulmonary metastatic disease. 3. Mild peribronchovascular reticular opacities which are most pronounced in the mid and lower lungs, similar to prior exam, progressed when compared with prior dated January 15, 2022. Findings can be seen in the setting of interstitial lung disease, pattern is most consistent with fibrotic NSIP. 4. Aortic Atherosclerosis (ICD10-I70.0).   09/08/2022 - 09/08/2022 Chemotherapy   Patient is on Treatment Plan : COLORECTAL Bevacizumab q21d     10/20/2022 -  Chemotherapy   Patient is on Treatment Plan : COLORECTAL 5FU + Leucovorin (Modified [place]) + Bevacizumab q14d        INTERVAL HISTORY:  [employer] is here for a follow up of  metastatic colon cancer . He was last seen by me on 11/03/2022. He presents to the  clinic accompanied by family. Pt state of having joint pain in his left shoulder started about 3 weeks ago. Pt reports of having black toenail without discharge and redness, nor pain.     All other systems were reviewed with the patient and are negative.  MEDICAL HISTORY:  Past Medical History:  Diagnosis Date   Bilateral leg edema 08/24/2019   Cancer (HCC)    skin ca, colon   Chronic cough    Chronic pain of left knee 12/01/2015   COVID-19    06/2019   Dysrhythmia    afib  was cardioverted   Essential hypertension 08/24/2019   Mixed hyperlipidemia 08/24/2019   Obesity (BMI 30-39.9) 08/24/2019   Persistent atrial fibrillation (HCC) 08/24/2019    SURGICAL HISTORY: Past Surgical History:  Procedure Laterality Date   ACHILLES TENDON REPAIR Left 2012   Ruptured   BRONCHIAL BIOPSY  02/09/2022   Procedure: BRONCHIAL BIOPSIES;  Surgeon: Josephine Igo, DO;  Location: [facility];  Service: Pulmonary;;   BRONCHIAL NEEDLE ASPIRATION BIOPSY  02/09/2022   Procedure: BRONCHIAL NEEDLE ASPIRATION BIOPSIES;  Surgeon: Josephine Igo, DO;  Location: [facility];  Service: Pulmonary;;   COLONOSCOPY WITH PROPOFOL N/A 12/11/2021   Procedure: COLONOSCOPY WITH PROPOFOL;  Surgeon: Jeani Hawking, MD;  Location:  [facility];  Service: Gastroenterology;  Laterality: N/A;   HERNIA REPAIR     umbilical hernia   IR IMAGING GUIDED PORT INSERTION  02/18/2022   SKIN SURGERY Left    Basal cell carcinoma on left forearm   XI ROBOTIC ASSISTED LOWER ANTERIOR RESECTION N/A 01/09/2021   Procedure: XI ROBOTIC ASSISTED RIGHT HEMI COLECTOMY, LYSIS OF ADHESIONS, SMALL BOWEL RESECTION, INTRAOPERATIVE ASSESMENT OF PERFUSION, PARTIAL OMENTECTOMY;  Surgeon: Andria Meuse, MD;  Location: [facility] ORS;  Service: General;  Laterality: N/A;    I have reviewed the social history and family history with the patient and they are unchanged from previous note.  ALLERGIES:  is allergic to furosemide and  tramadol.  MEDICATIONS:  Current Outpatient Medications  Medication Sig Dispense Refill   acetaminophen (TYLENOL) 500 MG tablet Take 500-1,000 mg by mouth every 6 (six) hours as needed (pain.).     amLODipine (NORVASC) 10 MG tablet Take 1 tablet (10 mg total) by mouth daily. 30 tablet 1   Ascorbic Acid (VITAMIN C WITH ROSE HIPS) 500 MG tablet Take 500 mg by mouth every evening.     Bacillus Coagulans-Inulin (ALIGN PREBIOTIC-PROBIOTIC PO) Take by mouth.     Cholecalciferol (D3-1000) 25 MCG (1000 UT) capsule Take 1,000 Units by mouth every evening.     diphenoxylate-atropine (LOMOTIL) 2.5-0.025 MG tablet Take 1 tablet by mouth 4 (four) times daily as needed for diarrhea or loose stools. 30 tablet 2   ELIQUIS 5 MG TABS tablet Take 1 tablet (5 mg total) by mouth 2 (two) times daily. 180 tablet 1   metoprolol tartrate (LOPRESSOR) 25 MG tablet Take 1 tablet (25 mg total) by mouth 2 (two) times daily. 180 tablet 3   Misc Natural Products (JOINT SUPPORT PO) Take 1 tablet by mouth daily. Instaflex Advanced Joint Support     nitroGLYCERIN (NITROSTAT) 0.4 MG SL tablet Place 1 tablet (0.4 mg total) under the tongue every 5 (five) minutes as needed. 25 tablet 7   potassium chloride SA (KLOR-CON M) 20 MEQ tablet Take 1 tablet (20 mEq total) by mouth daily. 30 tablet 1   sacubitril-valsartan (ENTRESTO) 24-26 MG Take 1 tablet by mouth 2 (two) times daily. 180 tablet 3   spironolactone (ALDACTONE) 50 MG tablet Take 1 tablet (50 mg total) by mouth daily. 10 tablet 0   vitamin B-12 (CYANOCOBALAMIN) 1000 MCG tablet Take 1,000 mcg by mouth every evening.     Zinc 50 MG CAPS Take 50 mg by mouth every evening.     No current facility-administered medications for this visit.   Facility-Administered Medications Ordered in Other Visits  Medication Dose Route Frequency Provider Last Rate Last Admin   0.9 %  sodium chloride infusion   Intravenous Once Malachy Mood, MD       bevacizumab-awwb (MVASI) 500 mg in sodium  chloride 0.9 % 100 mL chemo infusion  5 mg/kg (Treatment Plan Recorded) Intravenous Once Malachy Mood, MD       fluorouracil (ADRUCIL) 5,000 mg in sodium chloride 0.9 % 150 mL chemo infusion  2,400 mg/m2 (Treatment Plan Recorded) Intravenous 1 day or 1 dose Malachy Mood, MD       leucovorin 888 mg in sodium chloride 0.9 % 250 mL infusion  400 mg/m2 (Treatment Plan Recorded) Intravenous Once Malachy Mood, MD       prochlorperazine (COMPAZINE) tablet 10 mg  10 mg Oral Once Malachy Mood, MD       sodium chloride flush (NS) 0.9 % injection 10 mL  10 mL Intracatheter PRN Malachy Mood, MD        PHYSICAL EXAMINATION: ECOG PERFORMANCE STATUS: 1 - Symptomatic but completely ambulatory  Vitals:   11/17/22 0841  BP: (!) 158/90  Pulse: 69  Resp: 18  Temp: 98 F (36.7 C)  SpO2: 99%   Wt Readings from Last 3 Encounters:  11/17/22 218 lb 6.4 oz (99.1 kg)  11/03/22 221 lb 12.8 oz (100.6 kg)  10/20/22 216 lb 9.6 oz (98.2 kg)     GENERAL:alert, no distress and comfortable SKIN: skin color normal, no rashes or significant lesions EYES: normal, Conjunctiva are pink and non-injected, sclera clear  NEURO: alert & oriented x 3 with fluent speech   LABORATORY DATA:  I have reviewed the data as listed    Latest Ref Rng & Units 11/15/2022    1:31 PM 11/03/2022    7:49 AM 10/20/2022    8:06 AM  CBC  WBC 4.0 - 10.5 K/uL 5.6  5.1  4.6   Hemoglobin 13.0 - 17.0 g/dL 40.9  81.1  91.4   Hematocrit 39.0 - 52.0 % 40.5  39.0  39.8   Platelets 150 - 400 K/uL 144  146  130         Latest Ref Rng & Units 11/15/2022    1:31 PM 11/03/2022    7:49 AM 10/20/2022    8:06 AM  CMP  Glucose 70 - 99 mg/dL 98  782  93   BUN 8 - 23 mg/dL 7  6  6    Creatinine 0.61 - 1.24 mg/dL 9.56  2.13  0.86   Sodium 135 - 145 mmol/L 140  143  143   Potassium 3.5 - 5.1 mmol/L 3.4  3.0  3.0   Chloride 98 - 111 mmol/L 107  108  108   CO2 22 - 32 mmol/L 28  30  29    Calcium 8.9 - 10.3 mg/dL 8.5  8.3  8.5   Total Protein 6.5 - 8.1 g/dL 6.3  6.0   5.9   Total Bilirubin 0.3 - 1.2 mg/dL 0.7  0.7  0.7   Alkaline Phos 38 - 126 U/L 49  51  44   AST 15 - 41 U/L 17  16  17    ALT 0 - 44 U/L 9  9  11        RADIOGRAPHIC STUDIES: I have personally reviewed the radiological images as listed and agreed with the findings in the report. CT CHEST ABDOMEN PELVIS WO CONTRAST  Result Date: 11/17/2022 CLINICAL DATA:  Colon cancer. Assess treatment response. * Tracking Code: BO *. EXAM: CT CHEST, ABDOMEN AND PELVIS WITHOUT CONTRAST TECHNIQUE: Multidetector CT imaging of the chest, abdomen and pelvis was performed following the standard protocol without IV contrast. RADIATION DOSE REDUCTION: This exam was performed according to the departmental dose-optimization program which includes automated exposure control, adjustment of the mA and/or kV according to patient size and/or use of iterative reconstruction technique. COMPARISON:  CT chest 07/26/2022 and CT chest abdomen pelvis from 05/25/2022. FINDINGS: CT CHEST FINDINGS Cardiovascular: Heart size appears normal. No pericardial effusion. Aortic atherosclerosis and coronary artery calcification. Mediastinum/Nodes: 1.9 cm left lobe of thyroid gland nodule. This has been evaluated on previous imaging. (ref: J Am Coll Radiol. 2015 Feb;12(2): 143-50). The trachea and esophagus demonstrate no significant findings. No enlarged mediastinal or axillary lymph nodes. Lungs/Pleura: No pleural effusion. No airspace consolidation or pneumothorax. Bilateral lower lung zone predominant interstitial reticulation and mild bronchiectasis appears similar to the previous  exam. Areas of ground-glass attenuation are again seen within the perihilar right upper lobe. Index subpleural nodule overlying the lateral right upper lobe measures 1.1 cm, image 72/4. Stable from previous exam. Index nodule within the posterior right upper lobe measures 5 mm and is unchanged from previous exam, image 58/4. 4 mm sub solid nodule in the superior segment  of the left lower lobe is stable, image 60/4. No new lung nodules. Musculoskeletal: No chest wall mass or suspicious bone lesions identified. CT ABDOMEN PELVIS FINDINGS Hepatobiliary: Low-density structure within the anterior dome of right lobe of liver is unchanged measuring 8 mm, image 55/2. This remains too small to characterize but favored to represent a benign abnormality. No new or suspicious liver lesions. Gallbladder appears normal. No bile duct dilatation Pancreas: Unremarkable. No pancreatic ductal dilatation or surrounding inflammatory changes. Spleen: Normal in size without focal abnormality. Adrenals/Urinary Tract: Normal adrenal glands. No nephrolithiasis or hydronephrosis. Exophytic lesion off the lateral cortex of the inferior pole of left kidney measures 2.3 cm and 8 Hounsfield units compatible with a simple cyst. No follow-up imaging recommended. Bladder is unremarkable. Stomach/Bowel: Stomach appears normal. Status post right hemicolectomy. No bowel wall thickening, inflammation, or distension. Colonic diverticulosis without signs of acute diverticulitis. Vascular/Lymphatic: Aortic atherosclerosis. There is no mesenteric or retroperitoneal adenopathy. No enlarged abdominopelvic lymph nodes. Reproductive: Prostate gland enlargement. Other: No free fluid or fluid collections. No peritoneal nodularity. Musculoskeletal: No acute or significant osseous findings. IMPRESSION: 1. Stable CT of the chest, abdomen and pelvis. 2. Stable appearance of previously noted scattered small pulmonary nodules, which are favored to represent treated metastasis. 3. Status post right hemicolectomy. No signs of residual/recurrent tumor within the abdomen or pelvis. 4. Similar appearance of bilateral lower lung zone predominant interstitial reticulation and mild bronchiectasis. Findings are favored to represent underlying interstitial lung disease. Previously characterized as most consistent with fibrotic NSIP. 5. Coronary  artery calcifications. 6. Prostate gland enlargement. 7.  Aortic Atherosclerosis (ICD10-I70.0). Electronically Signed   By: Signa Kell M.D.   On: 11/17/2022 08:47      Orders Placed This Encounter  Procedures   CBC with Differential (Cancer Center Only)    Standing Status:   Future    Standing Expiration Date:   12/22/2023   CMP (Cancer Center only)    Standing Status:   Future    Standing Expiration Date:   12/22/2023   All questions were answered. The patient knows to call the clinic with any problems, questions or concerns. No barriers to learning was detected. The total time spent in the appointment was 30 minutes.     Malachy Mood, MD 11/17/2022   Carolin Coy, CMA, am acting as scribe for Malachy Mood, MD.   I have reviewed the above documentation for accuracy and completeness, and I agree with the above.

## 2022-11-16 NOTE — Assessment & Plan Note (Signed)
-  stage II (pT3, N0), metastatic disease to lung and possible nodes in July 2023, MSS, KRAS/NRAS/BRAF wild type   -found on screening colonoscopy. S/p right hemicolectomy 01/09/21. -lung metastasis confirmed in RUL by bronchoscopy on 02/09/22. -FoundationOne showed MSS, low tumor burden, KRAS/NRAS/BRAF wild-type, no other targetable mutations. The benefit of EGFR inhibitor is much less in right-sided colon cancer, so I will not give it as first line but may consider in subsequent lines.  -he started FOLFOX on 03/03/22. He tolerated well overall with diarrhea for several days. Bevacizumab added with C2 (9/20).  -CT scan 05/26/2022 showed improved lung mets, but showed new infiltrative lung change, possible related to his recent URI -repeated CT chest on 07/26/2022 showed stable known lung mets (overall excellent response, with minimum residual dsease) and resolved previously see inflammatory change in RLL -I have changed treatment to maintenance xeloda and beva every 3 weeks, he started C1 on 08/18/2022.  -due to recurrent diarrhea, I stopped Xeloda after 2 cycles -we have changed his treatment to 5-fu pump infusion and beva every 2 weeks on 4/24, he tolerated reasonably well, diarrhea has resolved now. -Restaging CT scan yesterday showed

## 2022-11-17 ENCOUNTER — Encounter: Payer: Self-pay | Admitting: Hematology

## 2022-11-17 ENCOUNTER — Inpatient Hospital Stay: Payer: Medicare HMO

## 2022-11-17 ENCOUNTER — Inpatient Hospital Stay (HOSPITAL_BASED_OUTPATIENT_CLINIC_OR_DEPARTMENT_OTHER): Payer: Medicare HMO | Admitting: Hematology

## 2022-11-17 ENCOUNTER — Inpatient Hospital Stay: Payer: Medicare HMO | Admitting: Dietician

## 2022-11-17 ENCOUNTER — Other Ambulatory Visit: Payer: Medicare HMO

## 2022-11-17 VITALS — BP 158/90 | HR 69 | Temp 98.0°F | Resp 18 | Ht 71.0 in | Wt 218.4 lb

## 2022-11-17 DIAGNOSIS — C184 Malignant neoplasm of transverse colon: Secondary | ICD-10-CM | POA: Diagnosis not present

## 2022-11-17 DIAGNOSIS — R197 Diarrhea, unspecified: Secondary | ICD-10-CM

## 2022-11-17 DIAGNOSIS — Z452 Encounter for adjustment and management of vascular access device: Secondary | ICD-10-CM | POA: Diagnosis not present

## 2022-11-17 DIAGNOSIS — C189 Malignant neoplasm of colon, unspecified: Secondary | ICD-10-CM

## 2022-11-17 DIAGNOSIS — C7800 Secondary malignant neoplasm of unspecified lung: Secondary | ICD-10-CM | POA: Diagnosis not present

## 2022-11-17 DIAGNOSIS — K5730 Diverticulosis of large intestine without perforation or abscess without bleeding: Secondary | ICD-10-CM | POA: Diagnosis not present

## 2022-11-17 DIAGNOSIS — C182 Malignant neoplasm of ascending colon: Secondary | ICD-10-CM

## 2022-11-17 DIAGNOSIS — R600 Localized edema: Secondary | ICD-10-CM | POA: Diagnosis not present

## 2022-11-17 DIAGNOSIS — Z5112 Encounter for antineoplastic immunotherapy: Secondary | ICD-10-CM | POA: Diagnosis not present

## 2022-11-17 DIAGNOSIS — Z5111 Encounter for antineoplastic chemotherapy: Secondary | ICD-10-CM | POA: Diagnosis not present

## 2022-11-17 MED ORDER — SODIUM CHLORIDE 0.9 % IV SOLN
5.0000 mg/kg | Freq: Once | INTRAVENOUS | Status: AC
  Administered 2022-11-17: 500 mg via INTRAVENOUS
  Filled 2022-11-17: qty 4

## 2022-11-17 MED ORDER — SODIUM CHLORIDE 0.9 % IV SOLN
2400.0000 mg/m2 | INTRAVENOUS | Status: DC
  Administered 2022-11-17: 5000 mg via INTRAVENOUS
  Filled 2022-11-17: qty 100

## 2022-11-17 MED ORDER — PROCHLORPERAZINE MALEATE 10 MG PO TABS
10.0000 mg | ORAL_TABLET | Freq: Once | ORAL | Status: AC
  Administered 2022-11-17: 10 mg via ORAL
  Filled 2022-11-17: qty 1

## 2022-11-17 MED ORDER — SODIUM CHLORIDE 0.9 % IV SOLN
400.0000 mg/m2 | Freq: Once | INTRAVENOUS | Status: AC
  Administered 2022-11-17: 888 mg via INTRAVENOUS
  Filled 2022-11-17: qty 44.4

## 2022-11-17 MED ORDER — DIPHENOXYLATE-ATROPINE 2.5-0.025 MG PO TABS
1.0000 | ORAL_TABLET | Freq: Four times a day (QID) | ORAL | 2 refills | Status: DC | PRN
Start: 2022-11-17 — End: 2023-01-31

## 2022-11-17 MED ORDER — SODIUM CHLORIDE 0.9 % IV SOLN: Freq: Once | INTRAVENOUS | Status: AC

## 2022-11-17 MED ORDER — SODIUM CHLORIDE 0.9% FLUSH
10.0000 mL | INTRAVENOUS | Status: DC | PRN
  Administered 2022-11-17: 10 mL

## 2022-11-17 NOTE — Patient Instructions (Addendum)
[facility] [place]  Discharge Instructions: Thank you for choosing [facility] to provide your oncology and hematology care.   If you have a lab appointment with the Cancer Center, please go directly to the Cancer Center and check in at the registration area.   Wear comfortable clothing and clothing appropriate for easy access to any Portacath or PICC line.   We strive to give you quality time with your provider. You may need to reschedule your appointment if you arrive late (15 or more minutes).  Arriving late affects you and other patients whose appointments are after yours.  Also, if you miss three or more appointments without notifying the office, you may be dismissed from the clinic at the provider's discretion.      For prescription refill requests, have your pharmacy contact our office and allow 72 hours for refills to be completed.    Today you received the following chemotherapy and/or immunotherapy agents: MVASI/Leucovorin/Fluorouracil      To help prevent nausea and vomiting after your treatment, we encourage you to take your nausea medication as directed.  BELOW ARE SYMPTOMS THAT SHOULD BE REPORTED IMMEDIATELY: *FEVER GREATER THAN 100.4 F (38 C) OR HIGHER *CHILLS OR SWEATING *NAUSEA AND VOMITING THAT IS NOT CONTROLLED WITH YOUR NAUSEA MEDICATION *UNUSUAL SHORTNESS OF BREATH *UNUSUAL BRUISING OR BLEEDING *URINARY PROBLEMS (pain or burning when urinating, or frequent urination) *BOWEL PROBLEMS (unusual diarrhea, constipation, pain near the anus) TENDERNESS IN MOUTH AND THROAT WITH OR WITHOUT PRESENCE OF ULCERS (sore throat, sores in mouth, or a toothache) UNUSUAL RASH, SWELLING OR PAIN  UNUSUAL VAGINAL DISCHARGE OR ITCHING   Items with * indicate a potential emergency and should be followed up as soon as possible or go to the Emergency Department if any problems should occur.  Please show the CHEMOTHERAPY ALERT CARD or IMMUNOTHERAPY  ALERT CARD at check-in to the Emergency Department and triage nurse.  Should you have questions after your visit or need to cancel or reschedule your appointment, please contact [facility]  Dept: [phone]  and follow the prompts.  Office hours are 8:00 a.m. to 4:30 p.m. Monday - Friday. Please note that voicemails left after 4:00 p.m. may not be returned until the following business day.  We are closed weekends and major holidays. You have access to a nurse at all times for urgent questions. Please call the main number to the clinic Dept: [phone] and follow the prompts.   For any non-urgent questions, you may also contact your provider using MyChart. We now offer e-Visits for anyone 65 and older to request care online for non-urgent symptoms. For details visit [email or web address].   Also download the MyChart app! Go to the app store, search "MyChart", open the app, select [place], and log in with your MyChart username and password.  The chemotherapy medication bag should finish at 46 hours, 96 hours, or 7 days. For example, if your pump is scheduled for 46 hours and it was put on at 4:00 p.m., it should finish at 2:00 p.m. the day it is scheduled to come off regardless of your appointment time.     Estimated time to finish at approximately 08:30 AM on 11/19/2022.   If the display on your pump reads "Low Volume" and it is beeping, take the batteries out of the pump and come to the cancer center for it to be taken off.   If the pump alarms go  off prior to the pump reading "Low Volume" then call [phone] and someone can assist you.  If the plunger comes out and the chemotherapy medication is leaking out, please use your home chemo spill kit to clean up the spill. Do NOT use paper towels or other household products.  If you have problems or questions regarding your pump, please call either [phone] (24 hours a day) or the cancer center  Monday-Friday 8:00 a.m.- 4:30 p.m. at the clinic number and we will assist you. If you are unable to get assistance, then go to the nearest Emergency Department and ask the staff to contact the IV team for assistance.

## 2022-11-17 NOTE — Progress Notes (Signed)
Nutrition Follow-up:  Patient with recurrent colon cancer metastatic to lung. He is currently receiving 5-FU pump + Bevacizumab q2w (start 4/24). Xeloda dose stopped due to poor tolerance.   Met with patient in infusion. He reports appetite is not as good as he wants. Recently enjoying potato soup (cheese/bacon). Diarrhea has improved significantly. Patient says he can go days without a single episodes. Intermittently having days with 4-6 episodes. Patient states there is no rhyme or reason for this. He would like to know what is causing this.    Medications: reviewed   Labs: K 3.4  Anthropometrics: Wt 218 lb 6.4 oz today decreased  5/8 - 221 lb 12.8 oz 4/24 - 216 lb 9.6 oz    NUTRITION DIAGNOSIS: Unintended weight loss continues    INTERVENTION:  Reviewed strategies for diarrhea, foods to limit and foods best tolerated Suggested starting a food journal to identify foods that may be contributing to episodes Provided samples of Boost Breeze, Ensure Clear, CIB powder for pt to try. Recommend daily ONS for added calories and protein     MONITORING, EVALUATION, GOAL: weight trends, intake   NEXT VISIT: Thursday June 27 during infusion

## 2022-11-19 ENCOUNTER — Other Ambulatory Visit: Payer: Self-pay

## 2022-11-19 ENCOUNTER — Inpatient Hospital Stay: Payer: Medicare HMO

## 2022-11-19 VITALS — BP 157/75 | HR 58 | Temp 98.6°F | Resp 18

## 2022-11-19 DIAGNOSIS — K5730 Diverticulosis of large intestine without perforation or abscess without bleeding: Secondary | ICD-10-CM | POA: Diagnosis not present

## 2022-11-19 DIAGNOSIS — Z5111 Encounter for antineoplastic chemotherapy: Secondary | ICD-10-CM | POA: Diagnosis not present

## 2022-11-19 DIAGNOSIS — C184 Malignant neoplasm of transverse colon: Secondary | ICD-10-CM | POA: Diagnosis not present

## 2022-11-19 DIAGNOSIS — C189 Malignant neoplasm of colon, unspecified: Secondary | ICD-10-CM

## 2022-11-19 DIAGNOSIS — C182 Malignant neoplasm of ascending colon: Secondary | ICD-10-CM | POA: Diagnosis not present

## 2022-11-19 DIAGNOSIS — C7800 Secondary malignant neoplasm of unspecified lung: Secondary | ICD-10-CM | POA: Diagnosis not present

## 2022-11-19 DIAGNOSIS — Z452 Encounter for adjustment and management of vascular access device: Secondary | ICD-10-CM | POA: Diagnosis not present

## 2022-11-19 DIAGNOSIS — R600 Localized edema: Secondary | ICD-10-CM | POA: Diagnosis not present

## 2022-11-19 DIAGNOSIS — Z5112 Encounter for antineoplastic immunotherapy: Secondary | ICD-10-CM | POA: Diagnosis not present

## 2022-11-19 MED ORDER — SODIUM CHLORIDE 0.9% FLUSH
10.0000 mL | INTRAVENOUS | Status: AC | PRN
  Administered 2022-11-19: 10 mL

## 2022-11-19 MED ORDER — HEPARIN SOD (PORK) LOCK FLUSH 100 UNIT/ML IV SOLN
500.0000 [IU] | Freq: Once | INTRAVENOUS | Status: AC | PRN
  Administered 2022-11-19: 500 [IU]

## 2022-11-29 ENCOUNTER — Encounter: Payer: Self-pay | Admitting: Cardiology

## 2022-11-29 ENCOUNTER — Ambulatory Visit: Payer: Medicare HMO | Attending: Cardiology | Admitting: Cardiology

## 2022-11-29 VITALS — BP 166/90 | HR 71 | Ht 71.0 in | Wt 215.8 lb

## 2022-11-29 DIAGNOSIS — I5032 Chronic diastolic (congestive) heart failure: Secondary | ICD-10-CM | POA: Diagnosis not present

## 2022-11-29 DIAGNOSIS — R0989 Other specified symptoms and signs involving the circulatory and respiratory systems: Secondary | ICD-10-CM | POA: Diagnosis not present

## 2022-11-29 DIAGNOSIS — G4733 Obstructive sleep apnea (adult) (pediatric): Secondary | ICD-10-CM | POA: Diagnosis not present

## 2022-11-29 DIAGNOSIS — I2510 Atherosclerotic heart disease of native coronary artery without angina pectoris: Secondary | ICD-10-CM | POA: Diagnosis not present

## 2022-11-29 DIAGNOSIS — I10 Essential (primary) hypertension: Secondary | ICD-10-CM | POA: Diagnosis not present

## 2022-11-29 DIAGNOSIS — I480 Paroxysmal atrial fibrillation: Secondary | ICD-10-CM | POA: Diagnosis not present

## 2022-11-29 MED ORDER — CARVEDILOL 6.25 MG PO TABS: 6.2500 mg | ORAL_TABLET | Freq: Two times a day (BID) | ORAL | 3 refills | Status: DC

## 2022-11-29 MED ORDER — SACUBITRIL-VALSARTAN 24-26 MG PO TABS: 1.0000 | ORAL_TABLET | Freq: Two times a day (BID) | ORAL | 3 refills | Status: DC

## 2022-11-29 NOTE — Patient Instructions (Signed)
Medication Instructions:  Your physician has recommended you make the following change in your medication:  STOP: Lopressor START: Carvedilol (Coreg) 6.25 mg twice daily  *If you need a refill on your cardiac medications before your next appointment, please call your pharmacy*   Lab Work: None   Testing/Procedures: Your physician has requested that you have an echocardiogram. Echocardiography is a painless test that uses sound waves to create images of your heart. It provides your doctor with information about the size and shape of your heart and how well your heart's chambers and valves are working. This procedure takes approximately one hour. There are no restrictions for this procedure. Please do NOT wear cologne, perfume, aftershave, or lotions (deodorant is allowed). Please arrive 15 minutes prior to your appointment time.    Follow-Up: At [facility] ([facility]), you and your health needs are our priority.  As part of our continuing mission to provide you with exceptional heart care, we have created designated Provider Care Teams.  These Care Teams include your primary Cardiologist (physician) and Advanced Practice Providers (APPs -  Physician Assistants and Nurse Practitioners) who all work together to provide you with the care you need, when you need it.    Your next appointment:   6 month(s)  Provider:   [name], DO

## 2022-11-29 NOTE — Progress Notes (Signed)
Cardiology Office Note:    Date:  11/30/2022   ID:  [name], DOB 1946-06-02, MRN [id number]  PCP:  Noni Saupe, MD  Cardiologist:  [name], DO  Electrophysiologist:  None   Referring MD: Noni Saupe, MD   " I am doing well"  History of Present Illness:    Thomas Knight is a 77 y.o. male with a hx of minimal coronary artery disease seen on coronary CTA, paroxysmal atrial fibrillation on Eliquis twice daily and metoprolol 25 mg twice a day, he is status post cardioversion and remains in sinus rhythm on his metoprolol.  He had declined use of antiarrhythmic.  History of COVID-19 infection back in March 2021.  Other includes hypertension, hyperlipidemia OSA .   When I last saw the patient October 2021 at that time he appeared to be doing well from a cardiovascular standpoint.  We discussed his report from his CTA which show minimal CAD as well as we talked about managing his paroxysmal atrial fibrillation on his medication regimen which included beta-blocker as well as Eliquis 5 mg twice a day.  I saw the patient on October 21, 2020 at that time he had a planned colonoscopy at [facility].  He had just been diagnosed with sleep apnea and was pending his sleep study.  Since I saw the patient underwent first colonoscopy with Dr. Elnoria Howard 10/2020. He was found to have a mass in his transverse colon that was biopsied. This showed mod diff adenoCA. 4mm desc colon polyp showed adenomatous polyp with HGD. CT CAP demonstrated an apple core mass in his transverse colon appears to be in the proximal half of it measuring 4.6 cm in size. No local regional adenopathy apparent on the CAT scan. No findings of metastatic disease.  Saw the patient in November 2022 at that time he was doing well from a cardiovascular standpoint.  He was seen on 05/28/2022 at that time he shared with me that we was undergoing chemo therapy - we did an echo which showed mildly depressed ejection fraction in  05/2022 - I added Entresto to his regimen. Then repeat echo in march 2024 showed improvement to 50-55%. Since he has had some chemotherapy infusion.   He is happy with the treatment plan from the oncologist. From a cardiovascular standpoint he offers no complaints. He is inquiring if he is ok to increase his exercise. He hopes his appetite can improve.   Past Medical History:  Diagnosis Date   Bilateral leg edema 08/24/2019   Cancer (HCC)    skin ca, colon   Chronic cough    Chronic pain of left knee 12/01/2015   COVID-19    06/2019   Dysrhythmia    afib  was cardioverted   Essential hypertension 08/24/2019   Mixed hyperlipidemia 08/24/2019   Obesity (BMI 30-39.9) 08/24/2019   Persistent atrial fibrillation (HCC) 08/24/2019    Past Surgical History:  Procedure Laterality Date   ACHILLES TENDON REPAIR Left 2012   Ruptured   BRONCHIAL BIOPSY  02/09/2022   Procedure: BRONCHIAL BIOPSIES;  Surgeon: Josephine Igo, DO;  Location: [facility];  Service: Pulmonary;;   BRONCHIAL NEEDLE ASPIRATION BIOPSY  02/09/2022   Procedure: BRONCHIAL NEEDLE ASPIRATION BIOPSIES;  Surgeon: Josephine Igo, DO;  Location: [facility];  Service: Pulmonary;;   COLONOSCOPY WITH PROPOFOL N/A 12/11/2021   Procedure: COLONOSCOPY WITH PROPOFOL;  Surgeon: Jeani Hawking, MD;  Location: [facility];  Service: Gastroenterology;  Laterality: N/A;   HERNIA  REPAIR     umbilical hernia   IR IMAGING GUIDED PORT INSERTION  02/18/2022   SKIN SURGERY Left    Basal cell carcinoma on left forearm   XI ROBOTIC ASSISTED LOWER ANTERIOR RESECTION N/A 01/09/2021   Procedure: XI ROBOTIC ASSISTED RIGHT HEMI COLECTOMY, LYSIS OF ADHESIONS, SMALL BOWEL RESECTION, INTRAOPERATIVE ASSESMENT OF PERFUSION, PARTIAL OMENTECTOMY;  Surgeon: Andria Meuse, MD;  Location: [facility] ORS;  Service: General;  Laterality: N/A;    Current Medications: Current Meds  Medication Sig   acetaminophen (TYLENOL) 500 MG tablet Take 500-1,000 mg by  mouth every 6 (six) hours as needed (pain.).   amLODipine (NORVASC) 10 MG tablet Take 1 tablet (10 mg total) by mouth daily.   Ascorbic Acid (VITAMIN C WITH ROSE HIPS) 500 MG tablet Take 500 mg by mouth every evening.   Bacillus Coagulans-Inulin (ALIGN PREBIOTIC-PROBIOTIC PO) Take by mouth.   carvedilol (COREG) 6.25 MG tablet Take 1 tablet (6.25 mg total) by mouth 2 (two) times daily.   Cholecalciferol (D3-1000) 25 MCG (1000 UT) capsule Take 1,000 Units by mouth every evening.   diphenoxylate-atropine (LOMOTIL) 2.5-0.025 MG tablet Take 1 tablet by mouth 4 (four) times daily as needed for diarrhea or loose stools.   ELIQUIS 5 MG TABS tablet Take 1 tablet (5 mg total) by mouth 2 (two) times daily.   Misc Natural Products (JOINT SUPPORT PO) Take 1 tablet by mouth daily. Instaflex Advanced Joint Support   nitroGLYCERIN (NITROSTAT) 0.4 MG SL tablet Place 1 tablet (0.4 mg total) under the tongue every 5 (five) minutes as needed.   potassium chloride SA (KLOR-CON M) 20 MEQ tablet Take 1 tablet (20 mEq total) by mouth daily.   spironolactone (ALDACTONE) 50 MG tablet Take 1 tablet (50 mg total) by mouth daily.   vitamin B-12 (CYANOCOBALAMIN) 1000 MCG tablet Take 1,000 mcg by mouth every evening.   Zinc 50 MG CAPS Take 50 mg by mouth every evening.   [DISCONTINUED] metoprolol tartrate (LOPRESSOR) 25 MG tablet Take 1 tablet (25 mg total) by mouth 2 (two) times daily.   [DISCONTINUED] sacubitril-valsartan (ENTRESTO) 24-26 MG Take 1 tablet by mouth 2 (two) times daily.     Allergies:   Furosemide and Tramadol   Social History   Socioeconomic History   Marital status: Married    Spouse name: Not on file   Number of children: Not on file   Years of education: Not on file   Highest education level: Not on file  Occupational History   Not on file  Tobacco Use   Smoking status: Never   Smokeless tobacco: Never  Vaping Use   Vaping Use: Never used  Substance and Sexual Activity   Alcohol use: Never    Drug use: Never   Sexual activity: Not Currently  Other Topics Concern   Not on file  Social History Narrative   Not on file   Social Determinants of Health   Financial Resource Strain: Not on file  Food Insecurity: Not on file  Transportation Needs: Not on file  Physical Activity: Not on file  Stress: Not on file  Social Connections: Not on file     Family History: The patient's family history includes Lung cancer in his father.  ROS:   Review of Systems  Constitution: Negative for decreased appetite, fever and weight gain.  HENT: Negative for congestion, ear discharge, hoarse voice and sore throat.   Eyes: Negative for discharge, redness, vision loss in right eye and visual halos.  Cardiovascular:  Negative for chest pain, dyspnea on exertion, leg swelling, orthopnea and palpitations.  Respiratory: Negative for cough, hemoptysis, shortness of breath and snoring.   Endocrine: Negative for heat intolerance and polyphagia.  Hematologic/Lymphatic: Negative for bleeding problem. Does not bruise/bleed easily.  Skin: Negative for flushing, nail changes, rash and suspicious lesions.  Musculoskeletal: Negative for arthritis, joint pain, muscle cramps, myalgias, neck pain and stiffness.  Gastrointestinal: Negative for abdominal pain, bowel incontinence, diarrhea and excessive appetite.  Genitourinary: Negative for decreased libido, genital sores and incomplete emptying.  Neurological: Negative for brief paralysis, focal weakness, headaches and loss of balance.  Psychiatric/Behavioral: Negative for altered mental status, depression and suicidal ideas.  Allergic/Immunologic: Negative for HIV exposure and persistent infections.    EKGs/Labs/Other Studies Reviewed:    The following studies were reviewed today:   EKG: None today   TTE 09/13/2022 IMPRESSIONS   1. Left ventricular ejection fraction, by estimation, is 50 to 55%. The left ventricle has low normal function. The left  ventricle has no regional wall motion abnormalities. There is mild asymmetric left ventricular hypertrophy of the septal segment.  Left ventricular diastolic parameters are indeterminate.   2. Right ventricular systolic function is mildly reduced. The right ventricular size is normal. Tricuspid regurgitation signal is inadequate for assessing PA pressure.   3. Left atrial size was mildly dilated.   4. The mitral valve is grossly normal. Trivial mitral valve regurgitation. No evidence of mitral stenosis.   5. The aortic valve is normal in structure. Aortic valve regurgitation is not visualized. No aortic stenosis is present.   6. The inferior vena cava is normal in size with greater than 50% respiratory variability, suggesting right atrial pressure of 3 mmHg.   FINDINGS   Left Ventricle: Left ventricular ejection fraction, by estimation, is 50 to 55%. The left ventricle has low normal function. The left ventricle has no regional wall motion abnormalities. The left ventricular internal cavity size was normal in size.  There is mild asymmetric left ventricular hypertrophy of the septal segment. Left ventricular diastolic parameters are indeterminate.   Right Ventricle: The right ventricular size is normal. No increase in right ventricular wall thickness. Right ventricular systolic function is mildly reduced. Tricuspid regurgitation signal is inadequate for assessing PA pressure.   Left Atrium: Left atrial size was mildly dilated.   Right Atrium: Right atrial size was normal in size.   Pericardium: Trivial pericardial effusion is present.   Mitral Valve: The mitral valve is grossly normal. Trivial mitral valve regurgitation. No evidence of mitral valve stenosis.   Tricuspid Valve: The tricuspid valve is normal in structure. Tricuspid valve regurgitation is trivial. No evidence of tricuspid stenosis.   Aortic Valve: The aortic valve is normal in structure. Aortic valve regurgitation is not  visualized. No aortic stenosis is present.   Pulmonic Valve: The pulmonic valve was normal in structure. Pulmonic valve regurgitation is trivial. No evidence of pulmonic stenosis.   Aorta: The aortic root is normal in size and structure. Ascending aorta measurements are within normal limits for age when indexed to body surface area.   Venous: The inferior vena cava is normal in size with greater than 50% respiratory variability, suggesting right atrial pressure of 3 mmHg.   IAS/Shunts: The interatrial septum appears to be lipomatous. No atrial level shunt detected by color flow Doppler.   Recent Labs: 05/10/2022: B Natriuretic Peptide 76.7 11/15/2022: ALT 9; BUN 7; Creatinine 0.72; Hemoglobin 13.7; Platelet Count 144; Potassium 3.4; Sodium 140  Recent Lipid Panel  No results found for: "CHOL", "TRIG", "HDL", "CHOLHDL", "VLDL", "LDLCALC", "LDLDIRECT"  Physical Exam:    VS:  BP (!) 166/90 (BP Location: Right Arm, Patient Position: Sitting, Cuff Size: Normal)   Pulse 71   Ht 5\' 11"  (1.803 m)   Wt 215 lb 12.8 oz (97.9 kg)   SpO2 97%   BMI 30.10 kg/m     Wt Readings from Last 3 Encounters:  11/29/22 215 lb 12.8 oz (97.9 kg)  11/17/22 218 lb 6.4 oz (99.1 kg)  11/03/22 221 lb 12.8 oz (100.6 kg)    GEN: Well nourished, well developed in no acute distress HEENT: Normal NECK: No JVD; No carotid bruits LYMPHATICS: No lymphadenopathy CARDIAC: S1S2 noted,RRR, no murmurs, rubs, gallops RESPIRATORY:  Clear to auscultation without rales, wheezing or rhonchi  ABDOMEN: Soft, non-tender, non-distended, +bowel sounds, no guarding. EXTREMITIES: No edema, No cyanosis, no clubbing MUSCULOSKELETAL:  No deformity  SKIN: Warm and dry NEUROLOGIC:  Alert and oriented x 3, non-focal PSYCHIATRIC:  Normal affect, good insight  ASSESSMENT:    1. Depressed left ventricular ejection fraction   2. PAF (paroxysmal atrial fibrillation) (HCC)   3. Essential hypertension   4. Chronic diastolic heart failure  (HCC)   5. Minimal CAD   6. Obstructive sleep apnea syndrome   7. Morbid obesity (HCC)    PLAN:    Clinically appears to be euvolemic. He is hypertensive in the office today, will stop the Lopressor and start carvedilol 6.25 mg. Repeat echocardiogram In terms of his atrial fibrillation he will be on Coreg as well as Eliquis The patient understands the need to lose weight with diet and exercise. We have discussed specific strategies for this. No anginal symptoms.  Continue CPAP  The patient is in agreement with the above plan. The patient left the office in stable condition.  The patient will follow up in 6 months or sooner if needed.   Medication Adjustments/Labs and Tests Ordered: Current medicines are reviewed at length with the patient today.  Concerns regarding medicines are outlined above.  Orders Placed This Encounter  Procedures   ECHOCARDIOGRAM COMPLETE    Meds ordered this encounter  Medications   carvedilol (COREG) 6.25 MG tablet    Sig: Take 1 tablet (6.25 mg total) by mouth 2 (two) times daily.    Dispense:  180 tablet    Refill:  3   sacubitril-valsartan (ENTRESTO) 24-26 MG    Sig: Take 1 tablet by mouth 2 (two) times daily.    Dispense:  180 tablet    Refill:  3     Patient Instructions  Medication Instructions:  Your physician has recommended you make the following change in your medication:  STOP: Lopressor START: Carvedilol (Coreg) 6.25 mg twice daily  *If you need a refill on your cardiac medications before your next appointment, please call your pharmacy*   Lab Work: None   Testing/Procedures: Your physician has requested that you have an echocardiogram. Echocardiography is a painless test that uses sound waves to create images of your heart. It provides your doctor with information about the size and shape of your heart and how well your heart's chambers and valves are working. This procedure takes approximately one hour. There are no  restrictions for this procedure. Please do NOT wear cologne, perfume, aftershave, or lotions (deodorant is allowed). Please arrive 15 minutes prior to your appointment time.    Follow-Up: At [facility], you and your health needs are our priority.  As part of our  continuing mission to provide you with exceptional heart care, we have created designated Provider Care Teams.  These Care Teams include your primary Cardiologist (physician) and Advanced Practice Providers (APPs -  Physician Assistants and Nurse Practitioners) who all work together to provide you with the care you need, when you need it.    Your next appointment:   6 month(s)  Provider:   [name], DO    Adopting a Healthy Lifestyle.  Know what a healthy weight is for you (roughly BMI <25) and aim to maintain this   Aim for 7+ servings of fruits and vegetables daily   65-80+ fluid ounces of water or unsweet tea for healthy kidneys   Limit to max 1 drink of alcohol per day; avoid smoking/tobacco   Limit animal fats in diet for cholesterol and heart health - choose grass fed whenever available   Avoid highly processed foods, and foods high in saturated/trans fats   Aim for low stress - take time to unwind and care for your mental health   Aim for 150 min of moderate intensity exercise weekly for heart health, and weights twice weekly for bone health   Aim for 7-9 hours of sleep daily   When it comes to diets, agreement about the perfect plan isnt easy to find, even among the experts. Experts at the [facility] of [employer] developed an idea known as the Healthy Eating Plate. Just imagine a plate divided into logical, healthy portions.   The emphasis is on diet quality:   Load up on vegetables and fruits - one-half of your plate: Aim for color and variety, and remember that potatoes dont count.   Go for whole grains - one-quarter of your plate: Whole wheat, barley, wheat berries, quinoa, oats,  brown rice, and foods made with them. If you want pasta, go with whole wheat pasta.   Protein power - one-quarter of your plate: Fish, chicken, beans, and nuts are all healthy, versatile protein sources. Limit red meat.   The diet, however, does go beyond the plate, offering a few other suggestions.   Use healthy plant oils, such as olive, canola, soy, corn, sunflower and peanut. Check the labels, and avoid partially hydrogenated oil, which have unhealthy trans fats.   If youre thirsty, drink water. Coffee and tea are good in moderation, but skip sugary drinks and limit milk and dairy products to one or two daily servings.   The type of carbohydrate in the diet is more important than the amount. Some sources of carbohydrates, such as vegetables, fruits, whole grains, and beans-are healthier than others.   Finally, stay active  Signed, [name], DO  11/30/2022 7:59 AM    [facility]

## 2022-11-30 ENCOUNTER — Other Ambulatory Visit: Payer: Self-pay

## 2022-11-30 DIAGNOSIS — R0989 Other specified symptoms and signs involving the circulatory and respiratory systems: Secondary | ICD-10-CM | POA: Insufficient documentation

## 2022-12-01 ENCOUNTER — Other Ambulatory Visit: Payer: Medicare HMO

## 2022-12-01 ENCOUNTER — Ambulatory Visit: Payer: Medicare HMO

## 2022-12-01 ENCOUNTER — Ambulatory Visit: Payer: Medicare HMO | Admitting: Nurse Practitioner

## 2022-12-03 ENCOUNTER — Other Ambulatory Visit: Payer: Self-pay | Admitting: *Deleted

## 2022-12-03 DIAGNOSIS — I480 Paroxysmal atrial fibrillation: Secondary | ICD-10-CM

## 2022-12-03 MED ORDER — ELIQUIS 5 MG PO TABS
5.0000 mg | ORAL_TABLET | Freq: Two times a day (BID) | ORAL | 1 refills | Status: DC
Start: 2022-12-03 — End: 2023-04-20

## 2022-12-03 NOTE — Telephone Encounter (Signed)
Eliquis 5mg  refill request received. Patient is 78 years old, weight-97.9kg, Crea-0.72 on 11/15/22, Diagnosis-Afib, and last seen by Dr. Servando Salina on 11/29/22. Dose is appropriate based on dosing criteria. Will send in refill to requested pharmacy.

## 2022-12-07 NOTE — Progress Notes (Unsigned)
[name]   [phone] [phone]   Clinic Follow up Note   Patient Care Team: Noni Saupe, MD as PCP - General (Family Medicine) [name], DO as PCP - Cardiology (Cardiology) Malachy Mood, MD as Attending Physician (Hematology and Oncology)  Date of Service:  12/08/2022  CHIEF COMPLAINT: f/u of  metastatic colon cancer      CURRENT THERAPY:  - Maintenance Xeloda, will change back to 5-fu pump infusion in 3 weeks  -Bevacizumab  ASSESSMENT:  Thomas Knight is a 77 y.o. male with   Cancer of right colon (HCC) -stage II (pT3, N0), metastatic disease to lung and possible nodes in July 2023, MSS, KRAS/NRAS/BRAF wild type   -found on screening colonoscopy. S/p right hemicolectomy 01/09/21. -lung metastasis confirmed in RUL by bronchoscopy on 02/09/22. -FoundationOne showed MSS, low tumor burden, KRAS/NRAS/BRAF wild-type, no other targetable mutations. The benefit of EGFR inhibitor is much less in right-sided colon cancer, so I will not give it as first line but may consider in subsequent lines.  -he started FOLFOX on 03/03/22. He tolerated well overall with diarrhea for several days. Bevacizumab added with C2 (9/20).  -CT scan 05/26/2022 showed improved lung mets, but showed new infiltrative lung change, possible related to his recent URI -repeated CT chest on 07/26/2022 showed stable known lung mets (overall excellent response, with minimum residual dsease) and resolved previously see inflammatory change in RLL -I have changed treatment to maintenance xeloda and beva every 3 weeks, he started C1 on 08/18/2022.  -due to recurrent diarrhea, I stopped Xeloda after 2 cycles -we have changed his treatment to 5-fu pump infusion and beva every 2 weeks on 4/24, he tolerated reasonably well, diarrhea has resolved now. -Restaging CT scan yesterday showed stable appearance of previously scattered small pulmonary nodules, measuring 4-55mm, no new lesions.   Overall has much improved compared to his initial CT scan from July 2023, and it is a hard to tell if this is residual disease versus scar tissue.  -He has been on chemotherapy for 8 months now, we previously discussed the option to hold chemotherapy after 1 more months, or 4 more months, and monitor closely.  He would like to stop chemo after 1 more dose.  Will restart chemo if he has disease progression on future scan.  He understands he is at high risk for cancer progression after stopping chemo  Diarrhea and fatigue -Secondary chemotherapy -Continue supportive care, he knows to take Imodium and Lomotil as needed -I again encouraged him to take high-protein diet and stay active if he can.     PLAN: -lab reiewed, will proceed with D3,C3 5FU/BEVA/Adrucil  -Give two month break from chemo after next treatment and repeat scan in Aug. -lab/flush and treatment in 2 weeks, then off chemo   SUMMARY OF ONCOLOGIC HISTORY: Oncology History Overview Note   Cancer Staging  Cancer of right colon [facility]) Staging form: Colon and Rectum, AJCC 8th Edition - Pathologic stage from 01/09/2021: Stage IIA (pT3, pN0, cM0) - Signed by Malachy Mood, MD on 02/12/2022 Total positive nodes: 0 Histologic grading system: 4 grade system Histologic grade (G): G2 Residual tumor (R): R0 - None     Cancer of right colon (HCC)  10/2020 Procedure   Colonoscopy-Dr. Elnoria Howard     11/06/2020 Imaging   CT CHEST ABDOMEN PELVIS W CONTRAST   IMPRESSION: 1. 4.6 cm partially circumferential apple-core type neoplastic process involving the mid transverse colon. No findings for locoregional adenopathy or distant  metastatic disease. 2. Enlarged prostate gland with median lobe hypertrophy impressing on the base of the bladder. 3. 16 mm left thyroid nodule. Recommend follow-up thyroid ultrasound examination.   01/09/2021 Initial Diagnosis   Colon cancer (HCC)   01/09/2021 Pathology Results   B. COLON, RIGHT, RESECTION:  -   Adenocarcinoma, moderately differentiated, 3.0 cm  -  No carcinoma identified in seventeen lymph nodes (0/17)  -  Margins uninvolved by carcinoma  -  Tubular adenoma (x2)  -  Benign appendix  -  See oncology table and comment below   Margin Status for Invasive Carcinoma: All margins negative for  invasive carcinoma       Distance from Invasive Carcinoma to Radial (Circumferential)       Margin Status for Non-Invasive Tumor: All margins negative for  high-grade dysplasia / intramucosal carcinoma and low-grade dysplasia  Regional Lymph Nodes:       Number of Lymph Nodes with Tumor: 0       Number of Lymph Nodes Examined: 17  Tumor Deposits: 0  Distant Metastasis:       Distant Site(s) Involved: Not applicable  Pathologic Stage Classification (pTNM, AJCC 8th Edition): pT3, pN0   MMR Stable    01/09/2021 Cancer Staging   Staging form: Colon and Rectum, AJCC 8th Edition - Pathologic stage from 01/09/2021: Stage IIA (pT3, pN0, cM0) - Signed by Malachy Mood, MD on 02/12/2022 Total positive nodes: 0 Histologic grading system: 4 grade system Histologic grade (G): G2 Residual tumor (R): R0 - None   05/2021 Tumor Marker   Patient's tumor was tested for the following markers: CEA. Results of the tumor marker test revealed 2.1.   12/11/2021 Procedure   Colonoscopy-Dr. Elnoria Howard  There was evidence of a prior functional end-to-end ileo-colonic anastomosis in the transverse colon. This was patent and was characterized by healthy appearing mucosa. The anastomosis was traversed. Findings: Scattered small and large-mouthed diverticula were found in the sigmoid colon. - Patent functional end-to-end ileo-colonic anastomosis, characterized by healthy appearing mucosa. - Diverticulosis in the sigmoid colon. - No specimens collected.   01/25/2022 Imaging   CT CHEST ABDOMEN PELVIS W CONTRAST   IMPRESSION: 1. New right-sided pulmonary nodules measure up to 11 mm, nonspecific but concerning for  metastatic disease. Consider further evaluation with nuclear medicine PET/CT. 2. New nodularity/lymph nodes in the central mesentery, concerning for nodal disease involvement. 3. Slightly increased size of prominent retroperitoneal lymph nodes, nonspecific but also somewhat concerning for nodal disease involvement. 4. Surgical change of partial colectomy without evidence of local recurrence. 5. Stable prominent mediastinal lymph nodes, nonspecific but stability is reassuring. Attention on follow-up imaging suggested. 6. Stable prominent right external iliac lymph node, nonspecific but stability is reassuring. Attention on follow-up imaging suggested. 7. Stable hypodense 8 mm hepatic lesion technically too small to accurately characterize but stability is reassuring. Attention on follow-up imaging suggested. 8. 1.9 cm incidental left thyroid nodule. Recommend thyroid US. Reference: J Am Coll Radiol. 2015 Feb;12(2): 143-50 9.  Aortic Atherosclerosis (ICD10-I70.0).   02/09/2022 Procedure   Bronchoscopy under the care of Dr. Tonia Brooms    02/09/2022 Pathology Results   FINAL MICROSCOPIC DIAGNOSIS:   A. LUNG, RUL, FINE NEEDLE ASPIRATION  BIOPSY FORCEPS:  Adenocarcinoma with morphologic features consistent with metastasis from a colorectal primary.  Please see comment.   Comment: The following immunostains are performed with appropriate controls :  TTF-1: Negative .  Napsin A: Negative .  CK7: Negative.  CK20: Positive.  CDX2: Positive .  The above immuno histochemical profile is supportive of the rendered diagnosis.    08/18/2022 - 08/18/2022 Chemotherapy   Patient is on Treatment Plan : COLORECTAL Bevacizumab q14d     Colon cancer metastasized to lung [facility])  02/09/2022 Pathology Results   FINAL MICROSCOPIC DIAGNOSIS:   A. LUNG, RUL, FINE NEEDLE ASPIRATION  BIOPSY FORCEPS:  Adenocarcinoma with morphologic features consistent with metastasis from a colorectal primary.  Please see  comment.   Comment: The following immunostains are performed with appropriate controls :  TTF-1: Negative .  Napsin A: Negative .  CK7: Negative.  CK20: Positive.  CDX2: Positive .   The above immuno histochemical profile is supportive of the rendered diagnosis.    02/12/2022 Initial Diagnosis   Colon cancer metastasized to lung (HCC)   03/03/2022 - 07/31/2022 Chemotherapy   Patient is on Treatment Plan : COLORECTAL FOLFOX + Bevacizumab q14d     07/26/2022 Imaging    IMPRESSION: 1. Near-complete interval resolution of nodular and masslike areas of irregular consolidation, likely resolving infection or organizing pneumonia/drug reaction. 2. Additional previously seen pulmonary nodules are stable, consistent with treated pulmonary metastatic disease. 3. Mild peribronchovascular reticular opacities which are most pronounced in the mid and lower lungs, similar to prior exam, progressed when compared with prior dated January 15, 2022. Findings can be seen in the setting of interstitial lung disease, pattern is most consistent with fibrotic NSIP. 4. Aortic Atherosclerosis (ICD10-I70.0).   07/26/2022 Imaging    IMPRESSION: 1. Near-complete interval resolution of nodular and masslike areas of irregular consolidation, likely resolving infection or organizing pneumonia/drug reaction. 2. Additional previously seen pulmonary nodules are stable, consistent with treated pulmonary metastatic disease. 3. Mild peribronchovascular reticular opacities which are most pronounced in the mid and lower lungs, similar to prior exam, progressed when compared with prior dated January 15, 2022. Findings can be seen in the setting of interstitial lung disease, pattern is most consistent with fibrotic NSIP. 4. Aortic Atherosclerosis (ICD10-I70.0).   09/08/2022 - 09/08/2022 Chemotherapy   Patient is on Treatment Plan : COLORECTAL Bevacizumab q21d     10/20/2022 -  Chemotherapy   Patient is on Treatment Plan :  COLORECTAL 5FU + Leucovorin (Modified [place]) + Bevacizumab q14d        INTERVAL HISTORY:  [employer] is here for a follow up of  metastatic colon cancer . He was last seen by me on 11/17/2022. He presents to the clinic accompanied by wife. Pt state that his appetite has picked up the past couple days.Pt ate that he is having trouble with his left shoulder, far as raising up and he has  had this problem for about 6 weeks.Pt state that his diarrhea has resolved.     All other systems were reviewed with the patient and are negative.  MEDICAL HISTORY:  Past Medical History:  Diagnosis Date   Bilateral leg edema 08/24/2019   Cancer (HCC)    skin ca, colon   Chronic cough    Chronic pain of left knee 12/01/2015   COVID-19    06/2019   Dysrhythmia    afib  was cardioverted   Essential hypertension 08/24/2019   Mixed hyperlipidemia 08/24/2019   Obesity (BMI 30-39.9) 08/24/2019   Persistent atrial fibrillation (HCC) 08/24/2019    SURGICAL HISTORY: Past Surgical History:  Procedure Laterality Date   ACHILLES TENDON REPAIR Left 2012   Ruptured   BRONCHIAL BIOPSY  02/09/2022   Procedure: BRONCHIAL BIOPSIES;  Surgeon: Josephine Igo, DO;  Location: [facility];  Service: Pulmonary;;   BRONCHIAL NEEDLE ASPIRATION BIOPSY  02/09/2022   Procedure: BRONCHIAL NEEDLE ASPIRATION BIOPSIES;  Surgeon: Josephine Igo, DO;  Location: [facility];  Service: Pulmonary;;   COLONOSCOPY WITH PROPOFOL N/A 12/11/2021   Procedure: COLONOSCOPY WITH PROPOFOL;  Surgeon: Jeani Hawking, MD;  Location: [facility];  Service: Gastroenterology;  Laterality: N/A;   HERNIA REPAIR     umbilical hernia   IR IMAGING GUIDED PORT INSERTION  02/18/2022   SKIN SURGERY Left    Basal cell carcinoma on left forearm   XI ROBOTIC ASSISTED LOWER ANTERIOR RESECTION N/A 01/09/2021   Procedure: XI ROBOTIC ASSISTED RIGHT HEMI COLECTOMY, LYSIS OF ADHESIONS, SMALL BOWEL RESECTION, INTRAOPERATIVE ASSESMENT OF PERFUSION,  PARTIAL OMENTECTOMY;  Surgeon: Andria Meuse, MD;  Location: [facility];  Service: General;  Laterality: N/A;    I have reviewed the social history and family history with the patient and they are unchanged from previous note.  ALLERGIES:  is allergic to furosemide and tramadol.  MEDICATIONS:  Current Outpatient Medications  Medication Sig Dispense Refill   acetaminophen (TYLENOL) 500 MG tablet Take 500-1,000 mg by mouth every 6 (six) hours as needed (pain.).     amLODipine (NORVASC) 10 MG tablet Take 1 tablet (10 mg total) by mouth daily. 30 tablet 1   Ascorbic Acid (VITAMIN C WITH ROSE HIPS) 500 MG tablet Take 500 mg by mouth every evening.     Bacillus Coagulans-Inulin (ALIGN PREBIOTIC-PROBIOTIC PO) Take by mouth.     carvedilol (COREG) 6.25 MG tablet Take 1 tablet (6.25 mg total) by mouth 2 (two) times daily. 180 tablet 3   Cholecalciferol (D3-1000) 25 MCG (1000 UT) capsule Take 1,000 Units by mouth every evening.     diphenoxylate-atropine (LOMOTIL) 2.5-0.025 MG tablet Take 1 tablet by mouth 4 (four) times daily as needed for diarrhea or loose stools. 30 tablet 2   ELIQUIS 5 MG TABS tablet Take 1 tablet (5 mg total) by mouth 2 (two) times daily. 180 tablet 1   Misc Natural Products (JOINT SUPPORT PO) Take 1 tablet by mouth daily. Instaflex Advanced Joint Support     nitroGLYCERIN (NITROSTAT) 0.4 MG SL tablet Place 1 tablet (0.4 mg total) under the tongue every 5 (five) minutes as needed. 25 tablet 7   potassium chloride SA (KLOR-CON M) 20 MEQ tablet Take 1 tablet (20 mEq total) by mouth daily. 30 tablet 1   sacubitril-valsartan (ENTRESTO) 24-26 MG Take 1 tablet by mouth 2 (two) times daily. 180 tablet 3   spironolactone (ALDACTONE) 50 MG tablet Take 1 tablet (50 mg total) by mouth daily. 10 tablet 0   vitamin B-12 (CYANOCOBALAMIN) 1000 MCG tablet Take 1,000 mcg by mouth every evening.     Zinc 50 MG CAPS Take 50 mg by mouth every evening.     No current facility-administered  medications for this visit.   Facility-Administered Medications Ordered in Other Visits  Medication Dose Route Frequency Provider Last Rate Last Admin   bevacizumab-awwb (MVASI) 500 mg in sodium chloride 0.9 % 100 mL chemo infusion  5 mg/kg (Treatment Plan Recorded) Intravenous Once Malachy Mood, MD       fluorouracil (ADRUCIL) 5,000 mg in sodium chloride 0.9 % 150 mL chemo infusion  2,400 mg/m2 (Treatment Plan Recorded) Intravenous 1 day or 1 dose Malachy Mood, MD       heparin lock flush 100 unit/mL  500 Units Intracatheter Once PRN Malachy Mood, MD       leucovorin 888 mg in sodium chloride 0.9 %  250 mL infusion  400 mg/m2 (Treatment Plan Recorded) Intravenous Once Malachy Mood, MD       sodium chloride flush (NS) 0.9 % injection 10 mL  10 mL Intracatheter PRN Malachy Mood, MD   10 mL at 11/19/22 0910   sodium chloride flush (NS) 0.9 % injection 10 mL  10 mL Intracatheter PRN Malachy Mood, MD        PHYSICAL EXAMINATION: ECOG PERFORMANCE STATUS: 1 - Symptomatic but completely ambulatory  Vitals:   12/08/22 0904  BP: (!) 156/83  Pulse: (!) 59  Resp: 16  Temp: 97.8 F (36.6 C)  SpO2: 99%   Wt Readings from Last 3 Encounters:  12/08/22 215 lb 12.8 oz (97.9 kg)  11/29/22 215 lb 12.8 oz (97.9 kg)  11/17/22 218 lb 6.4 oz (99.1 kg)     GENERAL:alert, no distress and comfortable SKIN: skin color normal, no rashes or significant lesions EYES: normal, Conjunctiva are pink and non-injected, sclera clear  NEURO: alert & oriented x 3 with fluent speech  LABORATORY DATA:  I have reviewed the data as listed    Latest Ref Rng & Units 12/08/2022    8:40 AM 11/15/2022    1:31 PM 11/03/2022    7:49 AM  CBC  WBC 4.0 - 10.5 K/uL 5.0  5.6  5.1   Hemoglobin 13.0 - 17.0 g/dL 16.1  09.6  04.5   Hematocrit 39.0 - 52.0 % 41.1  40.5  39.0   Platelets 150 - 400 K/uL 159  144  146         Latest Ref Rng & Units 12/08/2022    8:40 AM 11/15/2022    1:31 PM 11/03/2022    7:49 AM  CMP  Glucose 70 - 99 mg/dL 409  98   811   BUN 8 - 23 mg/dL 11  7  6    Creatinine 0.61 - 1.24 mg/dL 9.14  7.82  9.56   Sodium 135 - 145 mmol/L 141  140  143   Potassium 3.5 - 5.1 mmol/L 3.4  3.4  3.0   Chloride 98 - 111 mmol/L 107  107  108   CO2 22 - 32 mmol/L 29  28  30    Calcium 8.9 - 10.3 mg/dL 8.7  8.5  8.3   Total Protein 6.5 - 8.1 g/dL 6.4  6.3  6.0   Total Bilirubin 0.3 - 1.2 mg/dL 0.6  0.7  0.7   Alkaline Phos 38 - 126 U/L 44  49  51   AST 15 - 41 U/L 15  17  16    ALT 0 - 44 U/L 9  9  9        RADIOGRAPHIC STUDIES: I have personally reviewed the radiological images as listed and agreed with the findings in the report. No results found.    No orders of the defined types were placed in this encounter.  All questions were answered. The patient knows to call the clinic with any problems, questions or concerns. No barriers to learning was detected. The total time spent in the appointment was 30 minutes.     Malachy Mood, MD 12/08/2022   Carolin Coy, CMA, am acting as scribe for Malachy Mood, MD.   I have reviewed the above documentation for accuracy and completeness, and I agree with the above.

## 2022-12-07 NOTE — Assessment & Plan Note (Signed)
-  stage II (pT3, N0), metastatic disease to lung and possible nodes in July 2023, MSS, KRAS/NRAS/BRAF wild type   -found on screening colonoscopy. S/p right hemicolectomy 01/09/21. -lung metastasis confirmed in RUL by bronchoscopy on 02/09/22. -FoundationOne showed MSS, low tumor burden, KRAS/NRAS/BRAF wild-type, no other targetable mutations. The benefit of EGFR inhibitor is much less in right-sided colon cancer, so I will not give it as first line but may consider in subsequent lines.  -he started FOLFOX on 03/03/22. He tolerated well overall with diarrhea for several days. Bevacizumab added with C2 (9/20).  -CT scan 05/26/2022 showed improved lung mets, but showed new infiltrative lung change, possible related to his recent URI -repeated CT chest on 07/26/2022 showed stable known lung mets (overall excellent response, with minimum residual dsease) and resolved previously see inflammatory change in RLL -I have changed treatment to maintenance xeloda and beva every 3 weeks, he started C1 on 08/18/2022.  -due to recurrent diarrhea, I stopped Xeloda after 2 cycles -we have changed his treatment to 5-fu pump infusion and beva every 2 weeks on 4/24, he tolerated reasonably well, diarrhea has resolved now. -Restaging CT scan yesterday showed stable appearance of previously scattered small pulmonary nodules, measuring 4-42mm, no new lesions.  Overall has much improved compared to his initial CT scan from July 2023, and it is a hard to tell if this is residual disease versus scar tissue.  -He has been on chemotherapy for 8 months now, we discussed the option to hold chemotherapy after 1 more months, or 4 more months, and monitor closely.  Will restart chemo if he has disease progression on future scan.

## 2022-12-08 ENCOUNTER — Inpatient Hospital Stay (HOSPITAL_BASED_OUTPATIENT_CLINIC_OR_DEPARTMENT_OTHER): Payer: Medicare HMO | Admitting: Hematology

## 2022-12-08 ENCOUNTER — Inpatient Hospital Stay: Payer: Medicare HMO | Attending: Physician Assistant

## 2022-12-08 ENCOUNTER — Encounter: Payer: Self-pay | Admitting: Hematology

## 2022-12-08 ENCOUNTER — Inpatient Hospital Stay: Payer: Medicare HMO

## 2022-12-08 VITALS — BP 156/83 | HR 59 | Temp 97.8°F | Resp 16 | Ht 71.0 in | Wt 215.8 lb

## 2022-12-08 DIAGNOSIS — I10 Essential (primary) hypertension: Secondary | ICD-10-CM | POA: Insufficient documentation

## 2022-12-08 DIAGNOSIS — R197 Diarrhea, unspecified: Secondary | ICD-10-CM | POA: Insufficient documentation

## 2022-12-08 DIAGNOSIS — Z5112 Encounter for antineoplastic immunotherapy: Secondary | ICD-10-CM | POA: Diagnosis not present

## 2022-12-08 DIAGNOSIS — Z5111 Encounter for antineoplastic chemotherapy: Secondary | ICD-10-CM | POA: Insufficient documentation

## 2022-12-08 DIAGNOSIS — C184 Malignant neoplasm of transverse colon: Secondary | ICD-10-CM | POA: Diagnosis not present

## 2022-12-08 DIAGNOSIS — C7800 Secondary malignant neoplasm of unspecified lung: Secondary | ICD-10-CM

## 2022-12-08 DIAGNOSIS — J189 Pneumonia, unspecified organism: Secondary | ICD-10-CM | POA: Diagnosis not present

## 2022-12-08 DIAGNOSIS — M25552 Pain in left hip: Secondary | ICD-10-CM | POA: Insufficient documentation

## 2022-12-08 DIAGNOSIS — E041 Nontoxic single thyroid nodule: Secondary | ICD-10-CM | POA: Insufficient documentation

## 2022-12-08 DIAGNOSIS — Z452 Encounter for adjustment and management of vascular access device: Secondary | ICD-10-CM | POA: Diagnosis not present

## 2022-12-08 DIAGNOSIS — C189 Malignant neoplasm of colon, unspecified: Secondary | ICD-10-CM | POA: Diagnosis not present

## 2022-12-08 DIAGNOSIS — C182 Malignant neoplasm of ascending colon: Secondary | ICD-10-CM | POA: Diagnosis not present

## 2022-12-08 LAB — CBC WITH DIFFERENTIAL (CANCER CENTER ONLY)
Abs Immature Granulocytes: 0.01 10*3/uL (ref 0.00–0.07)
Basophils Absolute: 0.1 10*3/uL (ref 0.0–0.1)
Basophils Relative: 1 %
Eosinophils Absolute: 0.1 10*3/uL (ref 0.0–0.5)
Eosinophils Relative: 2 %
HCT: 41.1 % (ref 39.0–52.0)
Hemoglobin: 13.9 g/dL (ref 13.0–17.0)
Immature Granulocytes: 0 %
Lymphocytes Relative: 26 %
Lymphs Abs: 1.3 10*3/uL (ref 0.7–4.0)
MCH: 35.1 pg — ABNORMAL HIGH (ref 26.0–34.0)
MCHC: 33.8 g/dL (ref 30.0–36.0)
MCV: 103.8 fL — ABNORMAL HIGH (ref 80.0–100.0)
Monocytes Absolute: 0.6 10*3/uL (ref 0.1–1.0)
Monocytes Relative: 13 %
Neutro Abs: 2.9 10*3/uL (ref 1.7–7.7)
Neutrophils Relative %: 58 %
Platelet Count: 159 10*3/uL (ref 150–400)
RBC: 3.96 MIL/uL — ABNORMAL LOW (ref 4.22–5.81)
RDW: 15.3 % (ref 11.5–15.5)
WBC Count: 5 10*3/uL (ref 4.0–10.5)
nRBC: 0 % (ref 0.0–0.2)

## 2022-12-08 LAB — CMP (CANCER CENTER ONLY)
ALT: 9 U/L (ref 0–44)
AST: 15 U/L (ref 15–41)
Albumin: 3.3 g/dL — ABNORMAL LOW (ref 3.5–5.0)
Alkaline Phosphatase: 44 U/L (ref 38–126)
Anion gap: 5 (ref 5–15)
BUN: 11 mg/dL (ref 8–23)
CO2: 29 mmol/L (ref 22–32)
Calcium: 8.7 mg/dL — ABNORMAL LOW (ref 8.9–10.3)
Chloride: 107 mmol/L (ref 98–111)
Creatinine: 0.63 mg/dL (ref 0.61–1.24)
GFR, Estimated: >60 mL/min (ref >60)
Glucose, Bld: 102 mg/dL — ABNORMAL HIGH (ref 70–99)
Potassium: 3.4 mmol/L — ABNORMAL LOW (ref 3.5–5.1)
Sodium: 141 mmol/L (ref 135–145)
Total Bilirubin: 0.6 mg/dL (ref 0.3–1.2)
Total Protein: 6.4 g/dL — ABNORMAL LOW (ref 6.5–8.1)

## 2022-12-08 MED ORDER — SODIUM CHLORIDE 0.9 % IV SOLN
400.0000 mg/m2 | Freq: Once | INTRAVENOUS | Status: AC
  Administered 2022-12-08: 888 mg via INTRAVENOUS
  Filled 2022-12-08: qty 44.4

## 2022-12-08 MED ORDER — HEPARIN SOD (PORK) LOCK FLUSH 100 UNIT/ML IV SOLN: 500.0000 [IU] | Freq: Once | INTRAVENOUS | Status: DC | PRN

## 2022-12-08 MED ORDER — SODIUM CHLORIDE 0.9 % IV SOLN
2400.0000 mg/m2 | INTRAVENOUS | Status: DC
  Administered 2022-12-08: 5000 mg via INTRAVENOUS
  Filled 2022-12-08: qty 100

## 2022-12-08 MED ORDER — SODIUM CHLORIDE 0.9 % IV SOLN
5.0000 mg/kg | Freq: Once | INTRAVENOUS | Status: AC
  Administered 2022-12-08: 500 mg via INTRAVENOUS
  Filled 2022-12-08: qty 4

## 2022-12-08 MED ORDER — SODIUM CHLORIDE 0.9 % IV SOLN: Freq: Once | INTRAVENOUS | Status: AC

## 2022-12-08 MED ORDER — SODIUM CHLORIDE 0.9% FLUSH: 10.0000 mL | INTRAVENOUS | Status: DC | PRN

## 2022-12-08 MED ORDER — PROCHLORPERAZINE MALEATE 10 MG PO TABS
10.0000 mg | ORAL_TABLET | Freq: Once | ORAL | Status: AC
  Administered 2022-12-08: 10 mg via ORAL
  Filled 2022-12-08: qty 1

## 2022-12-08 NOTE — Patient Instructions (Signed)
[place] [facility]  Discharge Instructions: Thank you for choosing [facility] to provide your oncology and hematology care.   If you have a lab appointment with the Cancer Center, please go directly to the Cancer Center and check in at the registration area.   Wear comfortable clothing and clothing appropriate for easy access to any Portacath or PICC line.   We strive to give you quality time with your provider. You may need to reschedule your appointment if you arrive late (15 or more minutes).  Arriving late affects you and other patients whose appointments are after yours.  Also, if you miss three or more appointments without notifying the office, you may be dismissed from the clinic at the provider's discretion.      For prescription refill requests, have your pharmacy contact our office and allow 72 hours for refills to be completed.    Today you received the following chemotherapy and/or immunotherapy agents Bevacizumab, Leucovorin, Fluorouracil.      To help prevent nausea and vomiting after your treatment, we encourage you to take your nausea medication as directed.  BELOW ARE SYMPTOMS THAT SHOULD BE REPORTED IMMEDIATELY: *FEVER GREATER THAN 100.4 F (38 C) OR HIGHER *CHILLS OR SWEATING *NAUSEA AND VOMITING THAT IS NOT CONTROLLED WITH YOUR NAUSEA MEDICATION *UNUSUAL SHORTNESS OF BREATH *UNUSUAL BRUISING OR BLEEDING *URINARY PROBLEMS (pain or burning when urinating, or frequent urination) *BOWEL PROBLEMS (unusual diarrhea, constipation, pain near the anus) TENDERNESS IN MOUTH AND THROAT WITH OR WITHOUT PRESENCE OF ULCERS (sore throat, sores in mouth, or a toothache) UNUSUAL RASH, SWELLING OR PAIN  UNUSUAL VAGINAL DISCHARGE OR ITCHING   Items with * indicate a potential emergency and should be followed up as soon as possible or go to the Emergency Department if any problems should occur.  Please show the CHEMOTHERAPY ALERT CARD or  IMMUNOTHERAPY ALERT CARD at check-in to the Emergency Department and triage nurse.  Should you have questions after your visit or need to cancel or reschedule your appointment, please contact [place] [facility]  Dept: [phone]  and follow the prompts.  Office hours are 8:00 a.m. to 4:30 p.m. Monday - Friday. Please note that voicemails left after 4:00 p.m. may not be returned until the following business day.  We are closed weekends and major holidays. You have access to a nurse at all times for urgent questions. Please call the main number to the clinic Dept: [phone] and follow the prompts.   For any non-urgent questions, you may also contact your provider using MyChart. We now offer e-Visits for anyone 46 and older to request care online for non-urgent symptoms. For details visit [email or web address].   Also download the MyChart app! Go to the app store, search "MyChart", open the app, select [place], and log in with your MyChart username and password.

## 2022-12-10 ENCOUNTER — Inpatient Hospital Stay: Payer: Medicare HMO

## 2022-12-10 ENCOUNTER — Other Ambulatory Visit: Payer: Self-pay

## 2022-12-10 VITALS — BP 166/80 | HR 77 | Temp 98.1°F | Resp 18

## 2022-12-10 DIAGNOSIS — C184 Malignant neoplasm of transverse colon: Secondary | ICD-10-CM | POA: Diagnosis not present

## 2022-12-10 DIAGNOSIS — Z5111 Encounter for antineoplastic chemotherapy: Secondary | ICD-10-CM | POA: Diagnosis not present

## 2022-12-10 DIAGNOSIS — R197 Diarrhea, unspecified: Secondary | ICD-10-CM | POA: Diagnosis not present

## 2022-12-10 DIAGNOSIS — C189 Malignant neoplasm of colon, unspecified: Secondary | ICD-10-CM

## 2022-12-10 DIAGNOSIS — C7800 Secondary malignant neoplasm of unspecified lung: Secondary | ICD-10-CM | POA: Diagnosis not present

## 2022-12-10 DIAGNOSIS — E041 Nontoxic single thyroid nodule: Secondary | ICD-10-CM | POA: Diagnosis not present

## 2022-12-10 DIAGNOSIS — M25552 Pain in left hip: Secondary | ICD-10-CM | POA: Diagnosis not present

## 2022-12-10 DIAGNOSIS — Z5112 Encounter for antineoplastic immunotherapy: Secondary | ICD-10-CM | POA: Diagnosis not present

## 2022-12-10 DIAGNOSIS — J189 Pneumonia, unspecified organism: Secondary | ICD-10-CM | POA: Diagnosis not present

## 2022-12-10 DIAGNOSIS — Z452 Encounter for adjustment and management of vascular access device: Secondary | ICD-10-CM | POA: Diagnosis not present

## 2022-12-10 DIAGNOSIS — I10 Essential (primary) hypertension: Secondary | ICD-10-CM | POA: Diagnosis not present

## 2022-12-10 MED ORDER — HEPARIN SOD (PORK) LOCK FLUSH 100 UNIT/ML IV SOLN
500.0000 [IU] | Freq: Once | INTRAVENOUS | Status: AC | PRN
  Administered 2022-12-10: 500 [IU]

## 2022-12-10 MED ORDER — SODIUM CHLORIDE 0.9% FLUSH
10.0000 mL | INTRAVENOUS | Status: DC | PRN
  Administered 2022-12-10: 10 mL

## 2022-12-22 ENCOUNTER — Ambulatory Visit: Payer: Medicare HMO | Attending: Cardiology

## 2022-12-22 DIAGNOSIS — R0989 Other specified symptoms and signs involving the circulatory and respiratory systems: Secondary | ICD-10-CM | POA: Diagnosis not present

## 2022-12-22 DIAGNOSIS — I340 Nonrheumatic mitral (valve) insufficiency: Secondary | ICD-10-CM

## 2022-12-22 LAB — ECHOCARDIOGRAM COMPLETE
Area-P 1/2: 4.06 cm2
Calc EF: 47.1 %
S' Lateral: 3.4 cm
Single Plane A2C EF: 50 %
Single Plane A4C EF: 47.9 %

## 2022-12-23 ENCOUNTER — Inpatient Hospital Stay (HOSPITAL_BASED_OUTPATIENT_CLINIC_OR_DEPARTMENT_OTHER): Payer: Medicare HMO | Admitting: Nurse Practitioner

## 2022-12-23 ENCOUNTER — Inpatient Hospital Stay: Payer: Medicare HMO | Admitting: Dietician

## 2022-12-23 ENCOUNTER — Other Ambulatory Visit: Payer: Self-pay

## 2022-12-23 ENCOUNTER — Inpatient Hospital Stay: Payer: Medicare HMO

## 2022-12-23 ENCOUNTER — Encounter: Payer: Self-pay | Admitting: Nurse Practitioner

## 2022-12-23 DIAGNOSIS — C184 Malignant neoplasm of transverse colon: Secondary | ICD-10-CM | POA: Diagnosis not present

## 2022-12-23 DIAGNOSIS — E041 Nontoxic single thyroid nodule: Secondary | ICD-10-CM | POA: Diagnosis not present

## 2022-12-23 DIAGNOSIS — C7800 Secondary malignant neoplasm of unspecified lung: Secondary | ICD-10-CM

## 2022-12-23 DIAGNOSIS — C189 Malignant neoplasm of colon, unspecified: Secondary | ICD-10-CM | POA: Diagnosis not present

## 2022-12-23 DIAGNOSIS — E860 Dehydration: Secondary | ICD-10-CM

## 2022-12-23 DIAGNOSIS — M25552 Pain in left hip: Secondary | ICD-10-CM | POA: Diagnosis not present

## 2022-12-23 DIAGNOSIS — Z452 Encounter for adjustment and management of vascular access device: Secondary | ICD-10-CM | POA: Diagnosis not present

## 2022-12-23 DIAGNOSIS — I10 Essential (primary) hypertension: Secondary | ICD-10-CM | POA: Diagnosis not present

## 2022-12-23 DIAGNOSIS — R197 Diarrhea, unspecified: Secondary | ICD-10-CM | POA: Diagnosis not present

## 2022-12-23 DIAGNOSIS — Z5112 Encounter for antineoplastic immunotherapy: Secondary | ICD-10-CM | POA: Diagnosis not present

## 2022-12-23 DIAGNOSIS — C182 Malignant neoplasm of ascending colon: Secondary | ICD-10-CM

## 2022-12-23 DIAGNOSIS — J189 Pneumonia, unspecified organism: Secondary | ICD-10-CM | POA: Diagnosis not present

## 2022-12-23 DIAGNOSIS — Z5111 Encounter for antineoplastic chemotherapy: Secondary | ICD-10-CM | POA: Diagnosis not present

## 2022-12-23 LAB — CBC WITH DIFFERENTIAL (CANCER CENTER ONLY)
Abs Immature Granulocytes: 0.01 10*3/uL (ref 0.00–0.07)
Basophils Absolute: 0.1 10*3/uL (ref 0.0–0.1)
Basophils Relative: 1 %
Eosinophils Absolute: 0.1 10*3/uL (ref 0.0–0.5)
Eosinophils Relative: 2 %
HCT: 42.5 % (ref 39.0–52.0)
Hemoglobin: 14.2 g/dL (ref 13.0–17.0)
Immature Granulocytes: 0 %
Lymphocytes Relative: 27 %
Lymphs Abs: 1.4 10*3/uL (ref 0.7–4.0)
MCH: 34.3 pg — ABNORMAL HIGH (ref 26.0–34.0)
MCHC: 33.4 g/dL (ref 30.0–36.0)
MCV: 102.7 fL — ABNORMAL HIGH (ref 80.0–100.0)
Monocytes Absolute: 0.6 10*3/uL (ref 0.1–1.0)
Monocytes Relative: 12 %
Neutro Abs: 3 10*3/uL (ref 1.7–7.7)
Neutrophils Relative %: 58 %
Platelet Count: 128 10*3/uL — ABNORMAL LOW (ref 150–400)
RBC: 4.14 MIL/uL — ABNORMAL LOW (ref 4.22–5.81)
RDW: 14.7 % (ref 11.5–15.5)
WBC Count: 5.2 10*3/uL (ref 4.0–10.5)
nRBC: 0 % (ref 0.0–0.2)

## 2022-12-23 LAB — CMP (CANCER CENTER ONLY)
ALT: 10 U/L (ref 0–44)
AST: 15 U/L (ref 15–41)
Albumin: 3.4 g/dL — ABNORMAL LOW (ref 3.5–5.0)
Alkaline Phosphatase: 47 U/L (ref 38–126)
Anion gap: 6 (ref 5–15)
BUN: 11 mg/dL (ref 8–23)
CO2: 28 mmol/L (ref 22–32)
Calcium: 8.6 mg/dL — ABNORMAL LOW (ref 8.9–10.3)
Chloride: 107 mmol/L (ref 98–111)
Creatinine: 0.72 mg/dL (ref 0.61–1.24)
GFR, Estimated: >60 mL/min (ref >60)
Glucose, Bld: 95 mg/dL (ref 70–99)
Potassium: 3.7 mmol/L (ref 3.5–5.1)
Sodium: 141 mmol/L (ref 135–145)
Total Bilirubin: 0.7 mg/dL (ref 0.3–1.2)
Total Protein: 6.5 g/dL (ref 6.5–8.1)

## 2022-12-23 MED ORDER — SODIUM CHLORIDE 0.9% FLUSH
10.0000 mL | Freq: Once | INTRAVENOUS | Status: AC
  Administered 2022-12-23: 10 mL

## 2022-12-23 NOTE — Progress Notes (Signed)
Patient Care Team: Noni Saupe, MD as PCP - General (Family Medicine) [name], DO as PCP - Cardiology (Cardiology) Malachy Mood, MD as Attending Physician (Hematology and Oncology)   CHIEF COMPLAINT: Follow up colon cancer  Oncology History Overview Note   Cancer Staging  Cancer of right colon [facility]) Staging form: Colon and Rectum, AJCC 8th Edition - Pathologic stage from 01/09/2021: Stage IIA (pT3, pN0, cM0) - Signed by Malachy Mood, MD on 02/12/2022 Total positive nodes: 0 Histologic grading system: 4 grade system Histologic grade (G): G2 Residual tumor (R): R0 - None     Cancer of right colon (HCC)  10/2020 Procedure   Colonoscopy-Dr. Elnoria Howard     11/06/2020 Imaging   CT CHEST ABDOMEN PELVIS W CONTRAST   IMPRESSION: 1. 4.6 cm partially circumferential apple-core type neoplastic process involving the mid transverse colon. No findings for locoregional adenopathy or distant metastatic disease. 2. Enlarged prostate gland with median lobe hypertrophy impressing on the base of the bladder. 3. 16 mm left thyroid nodule. Recommend follow-up thyroid ultrasound examination.   01/09/2021 Initial Diagnosis   Colon cancer (HCC)   01/09/2021 Pathology Results   B. COLON, RIGHT, RESECTION:  -  Adenocarcinoma, moderately differentiated, 3.0 cm  -  No carcinoma identified in seventeen lymph nodes (0/17)  -  Margins uninvolved by carcinoma  -  Tubular adenoma (x2)  -  Benign appendix  -  See oncology table and comment below   Margin Status for Invasive Carcinoma: All margins negative for  invasive carcinoma       Distance from Invasive Carcinoma to Radial (Circumferential)       Margin Status for Non-Invasive Tumor: All margins negative for  high-grade dysplasia / intramucosal carcinoma and low-grade dysplasia  Regional Lymph Nodes:       Number of Lymph Nodes with Tumor: 0       Number of Lymph Nodes Examined: 17  Tumor Deposits: 0  Distant Metastasis:       Distant  Site(s) Involved: Not applicable  Pathologic Stage Classification (pTNM, AJCC 8th Edition): pT3, pN0   MMR Stable    01/09/2021 Cancer Staging   Staging form: Colon and Rectum, AJCC 8th Edition - Pathologic stage from 01/09/2021: Stage IIA (pT3, pN0, cM0) - Signed by Malachy Mood, MD on 02/12/2022 Total positive nodes: 0 Histologic grading system: 4 grade system Histologic grade (G): G2 Residual tumor (R): R0 - None   05/2021 Tumor Marker   Patient's tumor was tested for the following markers: CEA. Results of the tumor marker test revealed 2.1.   12/11/2021 Procedure   Colonoscopy-Dr. Elnoria Howard  There was evidence of a prior functional end-to-end ileo-colonic anastomosis in the transverse colon. This was patent and was characterized by healthy appearing mucosa. The anastomosis was traversed. Findings: Scattered small and large-mouthed diverticula were found in the sigmoid colon. - Patent functional end-to-end ileo-colonic anastomosis, characterized by healthy appearing mucosa. - Diverticulosis in the sigmoid colon. - No specimens collected.   01/25/2022 Imaging   CT CHEST ABDOMEN PELVIS W CONTRAST   IMPRESSION: 1. New right-sided pulmonary nodules measure up to 11 mm, nonspecific but concerning for metastatic disease. Consider further evaluation with nuclear medicine PET/CT. 2. New nodularity/lymph nodes in the central mesentery, concerning for nodal disease involvement. 3. Slightly increased size of prominent retroperitoneal lymph nodes, nonspecific but also somewhat concerning for nodal disease involvement. 4. Surgical change of partial colectomy without evidence of local recurrence. 5. Stable prominent mediastinal lymph nodes, nonspecific but  stability is reassuring. Attention on follow-up imaging suggested. 6. Stable prominent right external iliac lymph node, nonspecific but stability is reassuring. Attention on follow-up imaging suggested. 7. Stable hypodense 8 mm hepatic  lesion technically too small to accurately characterize but stability is reassuring. Attention on follow-up imaging suggested. 8. 1.9 cm incidental left thyroid nodule. Recommend thyroid US. Reference: J Am Coll Radiol. 2015 Feb;12(2): 143-50 9.  Aortic Atherosclerosis (ICD10-I70.0).   02/09/2022 Procedure   Bronchoscopy under the care of Dr. Tonia Brooms    02/09/2022 Pathology Results   FINAL MICROSCOPIC DIAGNOSIS:   A. LUNG, RUL, FINE NEEDLE ASPIRATION  BIOPSY FORCEPS:  Adenocarcinoma with morphologic features consistent with metastasis from a colorectal primary.  Please see comment.   Comment: The following immunostains are performed with appropriate controls :  TTF-1: Negative .  Napsin A: Negative .  CK7: Negative.  CK20: Positive.  CDX2: Positive .   The above immuno histochemical profile is supportive of the rendered diagnosis.    08/18/2022 - 08/18/2022 Chemotherapy   Patient is on Treatment Plan : COLORECTAL Bevacizumab q14d     Colon cancer metastasized to lung [facility])  02/09/2022 Pathology Results   FINAL MICROSCOPIC DIAGNOSIS:   A. LUNG, RUL, FINE NEEDLE ASPIRATION  BIOPSY FORCEPS:  Adenocarcinoma with morphologic features consistent with metastasis from a colorectal primary.  Please see comment.   Comment: The following immunostains are performed with appropriate controls :  TTF-1: Negative .  Napsin A: Negative .  CK7: Negative.  CK20: Positive.  CDX2: Positive .   The above immuno histochemical profile is supportive of the rendered diagnosis.    02/12/2022 Initial Diagnosis   Colon cancer metastasized to lung (HCC)   03/03/2022 - 07/31/2022 Chemotherapy   Patient is on Treatment Plan : COLORECTAL FOLFOX + Bevacizumab q14d     07/26/2022 Imaging    IMPRESSION: 1. Near-complete interval resolution of nodular and masslike areas of irregular consolidation, likely resolving infection or organizing pneumonia/drug reaction. 2. Additional previously seen pulmonary  nodules are stable, consistent with treated pulmonary metastatic disease. 3. Mild peribronchovascular reticular opacities which are most pronounced in the mid and lower lungs, similar to prior exam, progressed when compared with prior dated January 15, 2022. Findings can be seen in the setting of interstitial lung disease, pattern is most consistent with fibrotic NSIP. 4. Aortic Atherosclerosis (ICD10-I70.0).   07/26/2022 Imaging    IMPRESSION: 1. Near-complete interval resolution of nodular and masslike areas of irregular consolidation, likely resolving infection or organizing pneumonia/drug reaction. 2. Additional previously seen pulmonary nodules are stable, consistent with treated pulmonary metastatic disease. 3. Mild peribronchovascular reticular opacities which are most pronounced in the mid and lower lungs, similar to prior exam, progressed when compared with prior dated January 15, 2022. Findings can be seen in the setting of interstitial lung disease, pattern is most consistent with fibrotic NSIP. 4. Aortic Atherosclerosis (ICD10-I70.0).   09/08/2022 - 09/08/2022 Chemotherapy   Patient is on Treatment Plan : COLORECTAL Bevacizumab q21d     10/20/2022 -  Chemotherapy   Patient is on Treatment Plan : COLORECTAL 5FU + Leucovorin (Modified [place]) + Bevacizumab q14d        CURRENT THERAPY: 5FU/leuc and beva q2 weeks  INTERVAL HISTORY Mr. Wymer returns for follow-up and treatment as scheduled, last seen by Dr. Mosetta Putt 12/08/2022 with cycle 4 of 5-FU/leucovorin and Beva, tolerating well. Energy and appetite have improved lately, he is doing more. Diarrhea managed with lomotil. Denies n/v, pain, fever/chills. He has limited ROM in left  shoulder and mild left hip pain. Has seen ortho before. His HR was fluctuating when tech got vitals so an EKG was obtained, he is very surprised to see he's in Afib.   ROS  All other systems reviewed and negative   Past Medical History:  Diagnosis Date    Bilateral leg edema 08/24/2019   Cancer (HCC)    skin ca, colon   Chronic cough    Chronic pain of left knee 12/01/2015   COVID-19    06/2019   Dysrhythmia    afib  was cardioverted   Essential hypertension 08/24/2019   Mixed hyperlipidemia 08/24/2019   Obesity (BMI 30-39.9) 08/24/2019   Persistent atrial fibrillation (HCC) 08/24/2019     Past Surgical History:  Procedure Laterality Date   ACHILLES TENDON REPAIR Left 2012   Ruptured   BRONCHIAL BIOPSY  02/09/2022   Procedure: BRONCHIAL BIOPSIES;  Surgeon: Josephine Igo, DO;  Location: [facility];  Service: Pulmonary;;   BRONCHIAL NEEDLE ASPIRATION BIOPSY  02/09/2022   Procedure: BRONCHIAL NEEDLE ASPIRATION BIOPSIES;  Surgeon: Josephine Igo, DO;  Location: [facility];  Service: Pulmonary;;   COLONOSCOPY WITH PROPOFOL N/A 12/11/2021   Procedure: COLONOSCOPY WITH PROPOFOL;  Surgeon: Jeani Hawking, MD;  Location: [facility];  Service: Gastroenterology;  Laterality: N/A;   HERNIA REPAIR     umbilical hernia   IR IMAGING GUIDED PORT INSERTION  02/18/2022   SKIN SURGERY Left    Basal cell carcinoma on left forearm   XI ROBOTIC ASSISTED LOWER ANTERIOR RESECTION N/A 01/09/2021   Procedure: XI ROBOTIC ASSISTED RIGHT HEMI COLECTOMY, LYSIS OF ADHESIONS, SMALL BOWEL RESECTION, INTRAOPERATIVE ASSESMENT OF PERFUSION, PARTIAL OMENTECTOMY;  Surgeon: Andria Meuse, MD;  Location: [facility];  Service: General;  Laterality: N/A;     Outpatient Encounter Medications as of 12/23/2022  Medication Sig Note   acetaminophen (TYLENOL) 500 MG tablet Take 500-1,000 mg by mouth every 6 (six) hours as needed (pain.). 02/05/2022: rarely   amLODipine (NORVASC) 10 MG tablet Take 1 tablet (10 mg total) by mouth daily.    Ascorbic Acid (VITAMIN C WITH ROSE HIPS) 500 MG tablet Take 500 mg by mouth every evening.    Bacillus Coagulans-Inulin (ALIGN PREBIOTIC-PROBIOTIC PO) Take by mouth.    carvedilol (COREG) 6.25 MG tablet Take 1 tablet (6.25 mg total)  by mouth 2 (two) times daily.    Cholecalciferol (D3-1000) 25 MCG (1000 UT) capsule Take 1,000 Units by mouth every evening.    diphenoxylate-atropine (LOMOTIL) 2.5-0.025 MG tablet Take 1 tablet by mouth 4 (four) times daily as needed for diarrhea or loose stools.    ELIQUIS 5 MG TABS tablet Take 1 tablet (5 mg total) by mouth 2 (two) times daily.    Misc Natural Products (JOINT SUPPORT PO) Take 1 tablet by mouth daily. Instaflex Advanced Joint Support    nitroGLYCERIN (NITROSTAT) 0.4 MG SL tablet Place 1 tablet (0.4 mg total) under the tongue every 5 (five) minutes as needed.    potassium chloride SA (KLOR-CON M) 20 MEQ tablet Take 1 tablet (20 mEq total) by mouth daily.    sacubitril-valsartan (ENTRESTO) 24-26 MG Take 1 tablet by mouth 2 (two) times daily.    spironolactone (ALDACTONE) 50 MG tablet Take 1 tablet (50 mg total) by mouth daily.    vitamin B-12 (CYANOCOBALAMIN) 1000 MCG tablet Take 1,000 mcg by mouth every evening.    Zinc 50 MG CAPS Take 50 mg by mouth every evening.    Facility-Administered Encounter Medications as of 12/23/2022  Medication   sodium chloride flush (NS) 0.9 % injection 10 mL     Today's Vitals   12/23/22 1300  BP: (!) 153/78  Pulse: 63  Resp: 17  Temp: 97.8 F (36.6 C)  TempSrc: Oral  SpO2: 100%  Weight: 215 lb 8 oz (97.8 kg)   Body mass index is 30.06 kg/m.   PHYSICAL EXAM GENERAL:alert, no distress and comfortable SKIN: no rash  EYES: sclera clear NECK: without mass LYMPH:  no palpable cervical or supraclavicular lymphadenopathy  LUNGS: clear with normal breathing effort HEART: Afib with normal rate; no lower extremity edema ABDOMEN: abdomen soft, non-tender and normal bowel sounds NEURO: alert & oriented x 3 with fluent speech, no focal motor/sensory deficits PAC without erythema    CBC    Component Value Date/Time   WBC 5.2 12/23/2022 1158   WBC 6.2 02/09/2022 0731   RBC 4.14 (L) 12/23/2022 1158   HGB 14.2 12/23/2022 1158   HGB  16.5 08/01/2019 0959   HCT 42.5 12/23/2022 1158   HCT 47.4 08/01/2019 0959   PLT 128 (L) 12/23/2022 1158   PLT 198 08/01/2019 0959   MCV 102.7 (H) 12/23/2022 1158   MCV 90 08/01/2019 0959   MCH 34.3 (H) 12/23/2022 1158   MCHC 33.4 12/23/2022 1158   RDW 14.7 12/23/2022 1158   RDW 13.0 08/01/2019 0959   LYMPHSABS 1.4 12/23/2022 1158   MONOABS 0.6 12/23/2022 1158   EOSABS 0.1 12/23/2022 1158   BASOSABS 0.1 12/23/2022 1158     CMP     Component Value Date/Time   NA 141 12/23/2022 1158   NA 142 02/20/2020 0912   K 3.7 12/23/2022 1158   CL 107 12/23/2022 1158   CO2 28 12/23/2022 1158   GLUCOSE 95 12/23/2022 1158   BUN 11 12/23/2022 1158   BUN 9 02/20/2020 0912   CREATININE 0.72 12/23/2022 1158   CALCIUM 8.6 (L) 12/23/2022 1158   PROT 6.5 12/23/2022 1158   ALBUMIN 3.4 (L) 12/23/2022 1158   AST 15 12/23/2022 1158   ALT 10 12/23/2022 1158   ALKPHOS 47 12/23/2022 1158   BILITOT 0.7 12/23/2022 1158   GFRNONAA >60 12/23/2022 1158   GFRAA 98 02/20/2020 0912     ASSESSMENT & PLAN:Thomas Knight is a 77 y.o. male with    1. Right side colon cancer stage II (pT3, N0), metastatic disease to the lung found in July 2023, MSS, KRAS/NRAS/BRAF wild type  -found on screening colonoscopy. S/p right hemicolectomy 01/09/21  -surveillance CT CAP on 01/25/22 showed: new right lung nodules, up to 11 mm; new nodularity/lymph nodes in the central mesentery. PET scan 02/04/22 showed no hypermetabolic disease. -CEA on 01/29/22 WNL at 1.97 -bronchoscopy on 02/09/22 by Dr. Tonia Brooms showed adenocarcinoma in RUL, with morphologic features consistent with metastasis from a colorectal primary. -[employer] showed MSS, low tumor burden, KRAS/NRAS/BRAF wild-type, no other targetable mutations. The benefit of EGFR inhibitor is much less in right-sided colon cancer, so will not give it as first line but may consider in subsequent lines. -he started FOLFOX on 03/03/22, changed to maintenance Xeloda/beva 08/18/22, but  changed back to 5FU/leuc and beva on 4/24 due to diarrhea  -Mr. Schamp appears stable. S/p 4 cycles 5-FU/leuc/Beva, tolerating well with fatigue and mild diarrhea. SEs adequately managed with supportive care at home. Able to recover and function well, PS has improved, no clinical evidence of progression -Exam shows Afib with normal rate, he is concerned because he wasn't aware he was back in Afib, not sure  how long, etc. Doesn't think he has been in Afib since diagnosis 2021.  -He was going to stop chemo after today anyway, then go on observation, he would like to cancel treatment today. I reviewed it would be ok to proceed, but he is flustered, and after long discussion he wants to cancel -Will arrange lab/flush and CT in 8 weeks, then f/up with Dr. Mosetta Putt week later     2. Chemo toxicities: Diarrhea, elevated BP -s/p C2, he reports 5 days of diarrhea with 6-8 BM per day. He endorses using imodium, but I encouraged him to use more.  Dr. Mosetta Putt previously discussed option of changing the 5FU pump to oral Xeloda if he has persistent diarrhea. Will see how cycle 4 goes with increased imodium -S/p cycle 3 he had 6 days of soft/liquid stools up to 6/day; appears to be slightly improved.  I encouraged him to increase Imodium -he has rising BP secondary to beva. Amlodipine 5mg  was prescribed but he has not started it.  He agrees to start   PLAN: -Recent echo and today's labs and EKG reviewed -Pt prefers to hold chemo today -In A-fib with normal rate, will cc my note to cardiologist Dr. Servando Salina -Begin observation, labs/flush and CT in 8 weeks -Follow-up with Dr. Mosetta Putt a week after CT  Orders Placed This Encounter  Procedures   CT CHEST ABDOMEN PELVIS WO CONTRAST    Standing Status:   Future    Standing Expiration Date:   12/23/2023    Order Specific Question:   Preferred imaging location?    Answer:   [facility]    Order Specific Question:   If indicated for the ordered procedure, I authorize  the administration of oral contrast media per Radiology protocol    Answer:   Yes    Order Specific Question:   Does the patient have a contrast media/X-ray dye allergy?    Answer:   No     All questions were answered. The patient knows to call the clinic with any problems, questions or concerns. No barriers to learning were detected. I spent 20 minutes counseling the patient face to face. The total time spent in the appointment was 30 minutes and more than 50% was on counseling, review of test results, and coordination of care.   Santiago Glad, NP-C 12/23/2022

## 2022-12-23 NOTE — Progress Notes (Signed)
Nutrition Follow-up:  Patient with recurrent colon cancer metastatic to lung. He is currently receiving 5-FU pump + Bevacizumab q2w (start 4/24). Xeloda dose stopped due to poor tolerance.   Scheduled to see patient in infusion. Spoke with infusion RN. Treatment cancelled today secondary to Afib.    Medications: reviewed   Labs: reviewed   Anthropometrics: Wt 215 lb 8 oz today - stable x 3 weeks   NEXT VISIT: To be scheduled with treatment

## 2022-12-25 ENCOUNTER — Inpatient Hospital Stay: Payer: Medicare HMO

## 2023-01-26 ENCOUNTER — Telehealth: Payer: Self-pay | Admitting: Pulmonary Disease

## 2023-01-27 ENCOUNTER — Telehealth: Payer: Self-pay | Admitting: Pulmonary Disease

## 2023-01-27 HISTORY — PX: BASAL CELL CARCINOMA EXCISION: SHX1214

## 2023-01-27 NOTE — Telephone Encounter (Signed)
PT's wife presents to the front desk saying for insurance purposes she needs a copy of the lung biopsy from last year that would  show that the Colon Cancer he had metastasized to his lungs in order for her to file the insurance claim.  Please call wife with any questions @ [phone]

## 2023-01-27 NOTE — Telephone Encounter (Signed)
Please see the notes below from a encounter Dr. Tonia Brooms had w/PT:  Yes, the lesions do look like mets from colon cancer.    We will not know immediately. It will take about 5-7 days to wait on the final pathology report. Once we have the final pathology it will determine the next steps and treatment plans    I hope that helps    BLI   Thomas Igo, DO [facility] 02/04/2022 7:24 PM

## 2023-01-31 ENCOUNTER — Other Ambulatory Visit: Payer: Self-pay | Admitting: Hematology

## 2023-01-31 DIAGNOSIS — R197 Diarrhea, unspecified: Secondary | ICD-10-CM

## 2023-01-31 DIAGNOSIS — C189 Malignant neoplasm of colon, unspecified: Secondary | ICD-10-CM

## 2023-02-01 NOTE — Telephone Encounter (Signed)
Called and spoke with patient's wife Thomas Knight (DPR).  Thomas Knight is needing information for a insurance policy stating patient biopsy results or notes stating colon cancer metastasized to lungs.   Reviewed OV notes from 02/16/22 Sarah,NP, stating Assessment/Plan New diagnosis Adenocarcinoma of the lung with morphologic features consistent with metastasis from  a colorectal primary Plan is for chemotherapy>> Thomas Knight  every 2 weeks per Dr. Mosetta Putt.  Appointments have been made for port placement 02/18/2022  Thomas Knight stated they had seen OV notes on my chart and stated they would get notes from my chart for insurance.  Advised to call if needed. Nothing further at this time.

## 2023-02-09 DIAGNOSIS — C44311 Basal cell carcinoma of skin of nose: Secondary | ICD-10-CM | POA: Diagnosis not present

## 2023-02-14 ENCOUNTER — Inpatient Hospital Stay: Payer: Medicare HMO | Attending: Physician Assistant

## 2023-02-14 ENCOUNTER — Other Ambulatory Visit: Payer: Self-pay

## 2023-02-14 ENCOUNTER — Ambulatory Visit (HOSPITAL_COMMUNITY)
Admission: RE | Admit: 2023-02-14 | Discharge: 2023-02-14 | Disposition: A | Discharge status: Home / Self Care | Payer: Medicare HMO | Source: Ambulatory Visit | Attending: Nurse Practitioner | Admitting: Nurse Practitioner

## 2023-02-14 DIAGNOSIS — C182 Malignant neoplasm of ascending colon: Secondary | ICD-10-CM | POA: Insufficient documentation

## 2023-02-14 DIAGNOSIS — I700 Atherosclerosis of aorta: Secondary | ICD-10-CM | POA: Diagnosis not present

## 2023-02-14 DIAGNOSIS — C7800 Secondary malignant neoplasm of unspecified lung: Secondary | ICD-10-CM | POA: Insufficient documentation

## 2023-02-14 DIAGNOSIS — C189 Malignant neoplasm of colon, unspecified: Secondary | ICD-10-CM

## 2023-02-14 DIAGNOSIS — R197 Diarrhea, unspecified: Secondary | ICD-10-CM

## 2023-02-14 DIAGNOSIS — K5730 Diverticulosis of large intestine without perforation or abscess without bleeding: Secondary | ICD-10-CM | POA: Diagnosis not present

## 2023-02-14 DIAGNOSIS — N3289 Other specified disorders of bladder: Secondary | ICD-10-CM | POA: Diagnosis not present

## 2023-02-14 DIAGNOSIS — E860 Dehydration: Secondary | ICD-10-CM

## 2023-02-14 LAB — CBC WITH DIFFERENTIAL (CANCER CENTER ONLY)
Abs Immature Granulocytes: 0.01 10*3/uL (ref 0.00–0.07)
Basophils Absolute: 0.1 10*3/uL (ref 0.0–0.1)
Basophils Relative: 1 %
Eosinophils Absolute: 0.1 10*3/uL (ref 0.0–0.5)
Eosinophils Relative: 2 %
HCT: 44.9 % (ref 39.0–52.0)
Hemoglobin: 15.4 g/dL (ref 13.0–17.0)
Immature Granulocytes: 0 %
Lymphocytes Relative: 27 %
Lymphs Abs: 1.6 10*3/uL (ref 0.7–4.0)
MCH: 33.3 pg (ref 26.0–34.0)
MCHC: 34.3 g/dL (ref 30.0–36.0)
MCV: 97 fL (ref 80.0–100.0)
Monocytes Absolute: 0.6 10*3/uL (ref 0.1–1.0)
Monocytes Relative: 10 %
Neutro Abs: 3.4 10*3/uL (ref 1.7–7.7)
Neutrophils Relative %: 60 %
Platelet Count: 145 10*3/uL — ABNORMAL LOW (ref 150–400)
RBC: 4.63 MIL/uL (ref 4.22–5.81)
RDW: 13.6 % (ref 11.5–15.5)
WBC Count: 5.8 10*3/uL (ref 4.0–10.5)
nRBC: 0 % (ref 0.0–0.2)

## 2023-02-14 LAB — CMP (CANCER CENTER ONLY)
ALT: 13 U/L (ref 0–44)
AST: 16 U/L (ref 15–41)
Albumin: 3.7 g/dL (ref 3.5–5.0)
Alkaline Phosphatase: 50 U/L (ref 38–126)
Anion gap: 6 (ref 5–15)
BUN: 13 mg/dL (ref 8–23)
CO2: 28 mmol/L (ref 22–32)
Calcium: 8.6 mg/dL — ABNORMAL LOW (ref 8.9–10.3)
Chloride: 107 mmol/L (ref 98–111)
Creatinine: 0.84 mg/dL (ref 0.61–1.24)
GFR, Estimated: >60 mL/min (ref >60)
Glucose, Bld: 85 mg/dL (ref 70–99)
Potassium: 3.8 mmol/L (ref 3.5–5.1)
Sodium: 141 mmol/L (ref 135–145)
Total Bilirubin: 0.5 mg/dL (ref 0.3–1.2)
Total Protein: 6.7 g/dL (ref 6.5–8.1)

## 2023-02-14 MED ORDER — SODIUM CHLORIDE 0.9% FLUSH
10.0000 mL | Freq: Once | INTRAVENOUS | Status: AC
  Administered 2023-02-14: 10 mL

## 2023-02-14 MED ORDER — HEPARIN SOD (PORK) LOCK FLUSH 100 UNIT/ML IV SOLN
500.0000 [IU] | Freq: Once | INTRAVENOUS | Status: AC
  Administered 2023-02-14: 500 [IU]

## 2023-02-15 DIAGNOSIS — C44519 Basal cell carcinoma of skin of other part of trunk: Secondary | ICD-10-CM | POA: Diagnosis not present

## 2023-02-24 ENCOUNTER — Inpatient Hospital Stay (HOSPITAL_BASED_OUTPATIENT_CLINIC_OR_DEPARTMENT_OTHER): Payer: Medicare HMO | Admitting: Hematology

## 2023-02-24 ENCOUNTER — Encounter: Payer: Self-pay | Admitting: Hematology

## 2023-02-24 ENCOUNTER — Telehealth: Payer: Self-pay | Admitting: Radiation Oncology

## 2023-02-24 VITALS — BP 142/80 | HR 60 | Temp 97.5°F | Resp 18 | Ht 71.0 in | Wt 214.2 lb

## 2023-02-24 DIAGNOSIS — C182 Malignant neoplasm of ascending colon: Secondary | ICD-10-CM | POA: Diagnosis not present

## 2023-02-24 DIAGNOSIS — C7800 Secondary malignant neoplasm of unspecified lung: Secondary | ICD-10-CM | POA: Diagnosis not present

## 2023-02-24 NOTE — Assessment & Plan Note (Signed)
-  stage II (pT3, N0), metastatic disease to lung and possible nodes in July 2023, MSS, KRAS/NRAS/BRAF wild type   -found on screening colonoscopy. S/p right hemicolectomy 01/09/21. -lung metastasis confirmed in RUL by bronchoscopy on 02/09/22. -[employer] showed MSS, low tumor burden, KRAS/NRAS/BRAF wild-type, no other targetable mutations. The benefit of EGFR inhibitor is much less in right-sided colon cancer, so I will not give it as first line but may consider in subsequent lines.  -he started FOLFOX on 03/03/22. He tolerated well overall with diarrhea for several days. Bevacizumab added with C2 (9/20).  -CT scan 05/26/2022 showed improved lung mets, but showed new infiltrative lung change, possible related to his recent URI -repeated CT chest on 07/26/2022 showed stable known lung mets (overall excellent response, with minimum residual dsease) and resolved previously see inflammatory change in RLL -I have changed treatment to maintenance xeloda and beva every 3 weeks, he started C1 on 08/18/2022.  -due to recurrent diarrhea, I stopped Xeloda after 2 cycles -we have changed his treatment to 5-fu pump infusion and beva every 2 weeks on 4/24, he tolerated reasonably well, diarrhea has resolved now. -Restaging CT scan yesterday showed stable appearance of previously scattered small pulmonary nodules, measuring 4-43mm, no new lesions.  Overall has much improved compared to his initial CT scan from July 2023, and it is a hard to tell if this is residual disease versus scar tissue.  -We stopped his chemo after last scan and he is on cancer surveillance now. -Personally reviewed his follow-up CT scan from February 14, 2023, which showed slightly increase the size of the 2 nodule in the right lung, most consistent with disease progression.  No other new metastasis. -I recommend SBRT radiation, he is open to the treatment, I will make a referral to rad/onc

## 2023-02-24 NOTE — Telephone Encounter (Signed)
8/29 @ 2:02 pm Left voicemail on both patient's contact numbers for patient to call our office to be schedule for consult.

## 2023-02-24 NOTE — Progress Notes (Signed)
[facility]   [phone] Fax:([phone]   Clinic Follow up Note   Patient Care Team: Noni Saupe, MD as PCP - General (Family Medicine) [name], DO as PCP - Cardiology (Cardiology) Malachy Mood, MD as Attending Physician (Hematology and Oncology)  Date of Service:  02/24/2023  CHIEF COMPLAINT: f/u of  metastatic colon cancer      CURRENT THERAPY:  -Cancer surveillance  ASSESSMENT:  Thomas Knight is a 77 y.o. male with   Cancer of right colon (HCC) -stage II (pT3, N0), metastatic disease to lung and possible nodes in July 2023, MSS, KRAS/NRAS/BRAF wild type   -found on screening colonoscopy. S/p right hemicolectomy 01/09/21. -lung metastasis confirmed in RUL by bronchoscopy on 02/09/22. -[employer] showed MSS, low tumor burden, KRAS/NRAS/BRAF wild-type, no other targetable mutations. The benefit of EGFR inhibitor is much less in right-sided colon cancer, so I will not give it as first line but may consider in subsequent lines.  -he started FOLFOX on 03/03/22. He tolerated well overall with diarrhea for several days. Bevacizumab added with C2 (9/20).  -CT scan 05/26/2022 showed improved lung mets, but showed new infiltrative lung change, possible related to his recent URI -repeated CT chest on 07/26/2022 showed stable known lung mets (overall excellent response, with minimum residual dsease) and resolved previously see inflammatory change in RLL -I have changed treatment to maintenance xeloda and beva every 3 weeks, he started C1 on 08/18/2022.  -due to recurrent diarrhea, I stopped Xeloda after 2 cycles -we have changed his treatment to 5-fu pump infusion and beva every 2 weeks on 4/24, he tolerated reasonably well, diarrhea has resolved now. -Restaging CT scan yesterday showed stable appearance of previously scattered small pulmonary nodules, measuring 4-80mm, no new lesions.  Overall has much improved compared to his initial CT scan from July  2023, and it is a hard to tell if this is residual disease versus scar tissue.  -We stopped his chemo after last scan and he is on cancer surveillance now. -Personally reviewed his follow-up CT scan from February 14, 2023, which showed slightly increase the size of the 2 nodule in the right lung, most consistent with disease progression.  No other new metastasis. -I recommend SBRT radiation, he is open to the treatment, I will make a referral to rad/onc .  Patient and his family had multiple questions about radiation logistics and side effects, answered all to the best of my knowledge. -Also discussed option of surgical resection.  Due to his age and medical comorbidities, I think SBRT would be a better option for him.  Patient and his family at in total agreement.    PLAN: - reviewed CT scan results - recommend radiation - discussed radiation side effects with family - referral to radiation oncology  - f/u in 6 weeks    SUMMARY OF ONCOLOGIC HISTORY: Oncology History Overview Note   Cancer Staging  Cancer of right colon [facility]) Staging form: Colon and Rectum, AJCC 8th Edition - Pathologic stage from 01/09/2021: Stage IIA (pT3, pN0, cM0) - Signed by Malachy Mood, MD on 02/12/2022 Total positive nodes: 0 Histologic grading system: 4 grade system Histologic grade (G): G2 Residual tumor (R): R0 - None     Cancer of right colon (HCC)  10/2020 Procedure   Colonoscopy-Dr. Elnoria Howard     11/06/2020 Imaging   CT CHEST ABDOMEN PELVIS W CONTRAST   IMPRESSION: 1. 4.6 cm partially circumferential apple-core type neoplastic process involving the mid transverse colon. No  findings for locoregional adenopathy or distant metastatic disease. 2. Enlarged prostate gland with median lobe hypertrophy impressing on the base of the bladder. 3. 16 mm left thyroid nodule. Recommend follow-up thyroid ultrasound examination.   01/09/2021 Initial Diagnosis   Colon cancer (HCC)   01/09/2021 Pathology Results   B.  COLON, RIGHT, RESECTION:  -  Adenocarcinoma, moderately differentiated, 3.0 cm  -  No carcinoma identified in seventeen lymph nodes (0/17)  -  Margins uninvolved by carcinoma  -  Tubular adenoma (x2)  -  Benign appendix  -  See oncology table and comment below   Margin Status for Invasive Carcinoma: All margins negative for  invasive carcinoma       Distance from Invasive Carcinoma to Radial (Circumferential)       Margin Status for Non-Invasive Tumor: All margins negative for  high-grade dysplasia / intramucosal carcinoma and low-grade dysplasia  Regional Lymph Nodes:       Number of Lymph Nodes with Tumor: 0       Number of Lymph Nodes Examined: 17  Tumor Deposits: 0  Distant Metastasis:       Distant Site(s) Involved: Not applicable  Pathologic Stage Classification (pTNM, AJCC 8th Edition): pT3, pN0   MMR Stable    01/09/2021 Cancer Staging   Staging form: Colon and Rectum, AJCC 8th Edition - Pathologic stage from 01/09/2021: Stage IIA (pT3, pN0, cM0) - Signed by Malachy Mood, MD on 02/12/2022 Total positive nodes: 0 Histologic grading system: 4 grade system Histologic grade (G): G2 Residual tumor (R): R0 - None   05/2021 Tumor Marker   Patient's tumor was tested for the following markers: CEA. Results of the tumor marker test revealed 2.1.   12/11/2021 Procedure   Colonoscopy-Dr. Elnoria Howard  There was evidence of a prior functional end-to-end ileo-colonic anastomosis in the transverse colon. This was patent and was characterized by healthy appearing mucosa. The anastomosis was traversed. Findings: Scattered small and large-mouthed diverticula were found in the sigmoid colon. - Patent functional end-to-end ileo-colonic anastomosis, characterized by healthy appearing mucosa. - Diverticulosis in the sigmoid colon. - No specimens collected.   01/25/2022 Imaging   CT CHEST ABDOMEN PELVIS W CONTRAST   IMPRESSION: 1. New right-sided pulmonary nodules measure up to 11  mm, nonspecific but concerning for metastatic disease. Consider further evaluation with nuclear medicine PET/CT. 2. New nodularity/lymph nodes in the central mesentery, concerning for nodal disease involvement. 3. Slightly increased size of prominent retroperitoneal lymph nodes, nonspecific but also somewhat concerning for nodal disease involvement. 4. Surgical change of partial colectomy without evidence of local recurrence. 5. Stable prominent mediastinal lymph nodes, nonspecific but stability is reassuring. Attention on follow-up imaging suggested. 6. Stable prominent right external iliac lymph node, nonspecific but stability is reassuring. Attention on follow-up imaging suggested. 7. Stable hypodense 8 mm hepatic lesion technically too small to accurately characterize but stability is reassuring. Attention on follow-up imaging suggested. 8. 1.9 cm incidental left thyroid nodule. Recommend thyroid US. Reference: J Am Coll Radiol. 2015 Feb;12(2): 143-50 9.  Aortic Atherosclerosis (ICD10-I70.0).   02/09/2022 Procedure   Bronchoscopy under the care of Dr. Tonia Brooms    02/09/2022 Pathology Results   FINAL MICROSCOPIC DIAGNOSIS:   A. LUNG, RUL, FINE NEEDLE ASPIRATION  BIOPSY FORCEPS:  Adenocarcinoma with morphologic features consistent with metastasis from a colorectal primary.  Please see comment.   Comment: The following immunostains are performed with appropriate controls :  TTF-1: Negative .  Napsin A: Negative .  CK7: Negative.  CK20: Positive.  CDX2: Positive .   The above immuno histochemical profile is supportive of the rendered diagnosis.    08/18/2022 - 08/18/2022 Chemotherapy   Patient is on Treatment Plan : COLORECTAL Bevacizumab q14d     Colon cancer metastasized to lung [facility])  02/09/2022 Pathology Results   FINAL MICROSCOPIC DIAGNOSIS:   A. LUNG, RUL, FINE NEEDLE ASPIRATION  BIOPSY FORCEPS:  Adenocarcinoma with morphologic features consistent with metastasis from a  colorectal primary.  Please see comment.   Comment: The following immunostains are performed with appropriate controls :  TTF-1: Negative .  Napsin A: Negative .  CK7: Negative.  CK20: Positive.  CDX2: Positive .   The above immuno histochemical profile is supportive of the rendered diagnosis.    02/12/2022 Initial Diagnosis   Colon cancer metastasized to lung (HCC)   03/03/2022 - 07/31/2022 Chemotherapy   Patient is on Treatment Plan : COLORECTAL FOLFOX + Bevacizumab q14d     07/26/2022 Imaging    IMPRESSION: 1. Near-complete interval resolution of nodular and masslike areas of irregular consolidation, likely resolving infection or organizing pneumonia/drug reaction. 2. Additional previously seen pulmonary nodules are stable, consistent with treated pulmonary metastatic disease. 3. Mild peribronchovascular reticular opacities which are most pronounced in the mid and lower lungs, similar to prior exam, progressed when compared with prior dated January 15, 2022. Findings can be seen in the setting of interstitial lung disease, pattern is most consistent with fibrotic NSIP. 4. Aortic Atherosclerosis (ICD10-I70.0).   07/26/2022 Imaging    IMPRESSION: 1. Near-complete interval resolution of nodular and masslike areas of irregular consolidation, likely resolving infection or organizing pneumonia/drug reaction. 2. Additional previously seen pulmonary nodules are stable, consistent with treated pulmonary metastatic disease. 3. Mild peribronchovascular reticular opacities which are most pronounced in the mid and lower lungs, similar to prior exam, progressed when compared with prior dated January 15, 2022. Findings can be seen in the setting of interstitial lung disease, pattern is most consistent with fibrotic NSIP. 4. Aortic Atherosclerosis (ICD10-I70.0).   09/08/2022 - 09/08/2022 Chemotherapy   Patient is on Treatment Plan : COLORECTAL Bevacizumab q21d     10/20/2022 -  Chemotherapy    Patient is on Treatment Plan : COLORECTAL 5FU + Leucovorin (Modified [place]) + Bevacizumab q14d        INTERVAL HISTORY:  Thomas Knight is here for a follow up of  metastatic colon cancer . He was last seen by me on 11/17/2022. He presents to the clinic accompanied by wife and son and daughter on the phone. He states he is doing well overall. He has had 2 skin cancers removed. Back on the football field. His appetite has improved.    All other systems were reviewed with the patient and are negative.  MEDICAL HISTORY:  Past Medical History:  Diagnosis Date   Bilateral leg edema 08/24/2019   Cancer (HCC)    skin ca, colon   Chronic cough    Chronic pain of left knee 12/01/2015   COVID-19    06/2019   Dysrhythmia    afib  was cardioverted   Essential hypertension 08/24/2019   Mixed hyperlipidemia 08/24/2019   Obesity (BMI 30-39.9) 08/24/2019   Persistent atrial fibrillation (HCC) 08/24/2019    SURGICAL HISTORY: Past Surgical History:  Procedure Laterality Date   ACHILLES TENDON REPAIR Left 2012   Ruptured   BRONCHIAL BIOPSY  02/09/2022   Procedure: BRONCHIAL BIOPSIES;  Surgeon: Josephine Igo, DO;  Location: [facility];  Service: Pulmonary;;   BRONCHIAL NEEDLE ASPIRATION BIOPSY  02/09/2022   Procedure: BRONCHIAL NEEDLE ASPIRATION BIOPSIES;  Surgeon: Josephine Igo, DO;  Location: [facility];  Service: Pulmonary;;   COLONOSCOPY WITH PROPOFOL N/A 12/11/2021   Procedure: COLONOSCOPY WITH PROPOFOL;  Surgeon: Jeani Hawking, MD;  Location: [facility];  Service: Gastroenterology;  Laterality: N/A;   HERNIA REPAIR     umbilical hernia   IR IMAGING GUIDED PORT INSERTION  02/18/2022   SKIN SURGERY Left    Basal cell carcinoma on left forearm   XI ROBOTIC ASSISTED LOWER ANTERIOR RESECTION N/A 01/09/2021   Procedure: XI ROBOTIC ASSISTED RIGHT HEMI COLECTOMY, LYSIS OF ADHESIONS, SMALL BOWEL RESECTION, INTRAOPERATIVE ASSESMENT OF PERFUSION, PARTIAL OMENTECTOMY;  Surgeon: Andria Meuse, MD;  Location: [facility];  Service: General;  Laterality: N/A;    I have reviewed the social history and family history with the patient and they are unchanged from previous note.  ALLERGIES:  is allergic to furosemide and tramadol.  MEDICATIONS:  Current Outpatient Medications  Medication Sig Dispense Refill   acetaminophen (TYLENOL) 500 MG tablet Take 500-1,000 mg by mouth every 6 (six) hours as needed (pain.).     amLODipine (NORVASC) 10 MG tablet Take 1 tablet (10 mg total) by mouth daily. 30 tablet 1   Ascorbic Acid (VITAMIN C WITH ROSE HIPS) 500 MG tablet Take 500 mg by mouth every evening.     Bacillus Coagulans-Inulin (ALIGN PREBIOTIC-PROBIOTIC PO) Take by mouth.     carvedilol (COREG) 6.25 MG tablet Take 1 tablet (6.25 mg total) by mouth 2 (two) times daily. 180 tablet 3   Cholecalciferol (D3-1000) 25 MCG (1000 UT) capsule Take 1,000 Units by mouth every evening.     diphenoxylate-atropine (LOMOTIL) 2.5-0.025 MG tablet Take 1 tablet by mouth 4 (four) times daily as needed for diarrhea or loose stools. 30 tablet 2   ELIQUIS 5 MG TABS tablet Take 1 tablet (5 mg total) by mouth 2 (two) times daily. 180 tablet 1   Misc Natural Products (JOINT SUPPORT PO) Take 1 tablet by mouth daily. Instaflex Advanced Joint Support     nitroGLYCERIN (NITROSTAT) 0.4 MG SL tablet Place 1 tablet (0.4 mg total) under the tongue every 5 (five) minutes as needed. 25 tablet 7   potassium chloride SA (KLOR-CON M) 20 MEQ tablet Take 1 tablet (20 mEq total) by mouth daily. 30 tablet 1   sacubitril-valsartan (ENTRESTO) 24-26 MG Take 1 tablet by mouth 2 (two) times daily. 180 tablet 3   spironolactone (ALDACTONE) 50 MG tablet Take 1 tablet (50 mg total) by mouth daily. 10 tablet 0   vitamin B-12 (CYANOCOBALAMIN) 1000 MCG tablet Take 1,000 mcg by mouth every evening.     Zinc 50 MG CAPS Take 50 mg by mouth every evening.     No current facility-administered medications for this visit.    Facility-Administered Medications Ordered in Other Visits  Medication Dose Route Frequency Provider Last Rate Last Admin   sodium chloride flush (NS) 0.9 % injection 10 mL  10 mL Intracatheter PRN Malachy Mood, MD   10 mL at 11/19/22 0910    PHYSICAL EXAMINATION: ECOG PERFORMANCE STATUS: 1 - Symptomatic but completely ambulatory  Vitals:   02/24/23 1033  BP: (!) 142/80  Pulse: 60  Resp: 18  Temp: (!) 97.5 F (36.4 C)  SpO2: 100%    Wt Readings from Last 3 Encounters:  02/24/23 214 lb 3.2 oz (97.2 kg)  12/23/22 215 lb 8 oz (97.8 kg)  12/08/22 215 lb 12.8 oz (97.9 kg)  GENERAL:alert, no distress and comfortable SKIN: skin color normal, no rashes or significant lesions EYES: normal, Conjunctiva are pink and non-injected, sclera clear  NEURO: alert & oriented x 3 with fluent speech  LABORATORY DATA:  I have reviewed the data as listed    Latest Ref Rng & Units 02/14/2023    3:32 PM 12/23/2022   11:58 AM 12/08/2022    8:40 AM  CBC  WBC 4.0 - 10.5 K/uL 5.8  5.2  5.0   Hemoglobin 13.0 - 17.0 g/dL 16.1  09.6  04.5   Hematocrit 39.0 - 52.0 % 44.9  42.5  41.1   Platelets 150 - 400 K/uL 145  128  159         Latest Ref Rng & Units 02/14/2023    3:32 PM 12/23/2022   11:58 AM 12/08/2022    8:40 AM  CMP  Glucose 70 - 99 mg/dL 85  95  409   BUN 8 - 23 mg/dL 13  11  11    Creatinine 0.61 - 1.24 mg/dL 8.11  9.14  7.82   Sodium 135 - 145 mmol/L 141  141  141   Potassium 3.5 - 5.1 mmol/L 3.8  3.7  3.4   Chloride 98 - 111 mmol/L 107  107  107   CO2 22 - 32 mmol/L 28  28  29    Calcium 8.9 - 10.3 mg/dL 8.6  8.6  8.7   Total Protein 6.5 - 8.1 g/dL 6.7  6.5  6.4   Total Bilirubin 0.3 - 1.2 mg/dL 0.5  0.7  0.6   Alkaline Phos 38 - 126 U/L 50  47  44   AST 15 - 41 U/L 16  15  15    ALT 0 - 44 U/L 13  10  9        RADIOGRAPHIC STUDIES: I have personally reviewed the radiological images as listed and agreed with the findings in the report. No results found.    No orders of the  defined types were placed in this encounter.  All questions were answered. The patient knows to call the clinic with any problems, questions or concerns. No barriers to learning was detected. The total time spent in the appointment was 40 minutes.     Malachy Mood, MD 02/24/2023

## 2023-03-02 DIAGNOSIS — R0981 Nasal congestion: Secondary | ICD-10-CM | POA: Diagnosis not present

## 2023-03-02 DIAGNOSIS — R051 Acute cough: Secondary | ICD-10-CM | POA: Diagnosis not present

## 2023-03-08 ENCOUNTER — Ambulatory Visit: Payer: Medicare HMO | Admitting: Radiation Oncology

## 2023-03-08 ENCOUNTER — Ambulatory Visit: Payer: Medicare HMO

## 2023-03-10 DIAGNOSIS — M25511 Pain in right shoulder: Secondary | ICD-10-CM | POA: Diagnosis not present

## 2023-03-11 DIAGNOSIS — C4362 Malignant melanoma of left upper limb, including shoulder: Secondary | ICD-10-CM | POA: Diagnosis not present

## 2023-03-14 NOTE — Progress Notes (Signed)
Thoracic Location of Tumor / Histology: Right Colon Cancer metastatic to Right Lung  Patient was found to have mass in her colon in July 2022 following a screening colonoscopy.  She is s/p right hemicolectomy.  She was noted by bronchoscopy on 02/09/2022 to have RUL lung metastasis.  CT Chest 02/14/2023:Two small right upper lobe pulmonary nodules are slightly increased in size, largest 0.7 cm, cannot exclude slightly growing pulmonary metastases.   No additional potential findings of metastatic disease in the chest, abdomen or pelvis.  CT Chest 11/15/2022: Stable CT of the chest, abdomen and pelvis.  Stable appearance of previously noted scattered small pulmonary nodules, which are favored to represent treated metastasis.  Status post right hemicolectomy. No signs of residual/recurrent tumor within the abdomen or pelvis.  CT Chest 07/26/2022:  Near-complete interval resolution of nodular and masslike areas of irregular consolidation, likely resolving infection or organizing pneumonia/drug reaction. 2. Additional previously seen pulmonary nodules are stable, consistent with treated pulmonary metastatic disease.  CT Chest 05/25/2022: Previously noted pulmonary nodules have decreased in size compared to the prior examination, most evident in the posterior aspect of the right upper lobe where there is currently a 7 x 4 mm pulmonary nodule (previously 1.0 x 0.8 cm) on prior CT 01/25/2022.     Biopsies of RUL Lung Mass 02/09/2022     Past/Anticipated interventions by cardiothoracic surgery, if any:   Past/Anticipated interventions by medical oncology, if any:  Dr. Mosetta Putt 02/24/2023 -Personally reviewed his follow-up CT scan from February 14, 2023, which showed slightly increase the size of the 2 nodule in the right lung, most consistent with disease progression.  No other new metastasis. -I recommend SBRT radiation, he is open to the treatment, I will make a referral to rad/onc .  -Also discussed option  of surgical resection.  Due to his age and medical comorbidities, I think SBRT would be a better option for him.  Patient and his family at in total agreement.    Tobacco/Marijuana/Snuff/ETOH use: Non smoker  Signs/Symptoms Weight changes, if any: He states he lost about 48 pounds during chemo and has since gained about 15 pounds back. Respiratory complaints, if any: Denies any SOB.  He is using a spirometer on occasion. Hemoptysis, if any: Denies cough. Pain issues, if any:  Denies chest pain, pressure, or tightness.  SAFETY ISSUES: Prior radiation? No Pacemaker/ICD? No Possible current pregnancy? n/a  Is the patient on methotrexate? No  Current Complaints / other details:

## 2023-03-14 NOTE — Progress Notes (Signed)
Radiation Oncology         [phone] ________________________________  Name: Thomas Knight        MRN: [id number]  Date of Service: 03/15/2023 DOB: Nov 07, 1945  MV:HQIONGE, Valrie Hart, MD  Malachy Mood, MD     REFERRING PHYSICIAN: Malachy Mood, MD   DIAGNOSIS: {There were no encounter diagnoses. (Refresh or delete this SmartLink)}   HISTORY OF PRESENT ILLNESS: Thomas Knight is a 77 y.o. male seen at the request of Dr. Mosetta Putt with recurrent metastatic colon cancer involving the lung.  The patient was originally diagnosed in 2022 with a Stage IIA, pT3N0M0, adenocarcinoma of the right colon.  Following right hemicolectomy on 01/09/2021 he was being monitored in surveillance.  A lung nodule was noted on imaging in the right upper lobe in July 23 measuring 11 mm and an additional posterior peripheral right upper lobe nodule measuring 10 mm and a new 3 mm central right upper lobe nodule was also identified.  A PET scan on 02/04/2022 showed no hypermetabolic changes but identified the nodules seen, the SUV max was 1.81 and the largest lesion but the blood pool was 2.07.  A biopsy on 02/09/2022 showed adenocarcinoma with morphologic features consistent with colorectal primary.  He was found to have an EGFR mutation but was started on FOLFOX on 03/03/2022 with bevacizumab added in cycle 2.  This showed an excellent response overall with minimal residual disease by imaging in January 24 and he was switched to maintenance Alimta and bevacizumab in February 24.  This was stopped after 2 cycles due to diarrhea and his regimen was switched to 5-FU pump infusion with bevacizumab beginning in April 2024 and concluding in June 2024.  Restaging scans continue to be performed and scans in May 2024 showed stable findings.  His most recent CT restaging scan on 02/14/2023 showed slight increase in the 2 right upper lobe nodules, one measuring 7 mm in greatest dimension the other 1.1 cm.  He is seen to consider stereotactic body  radiotherapy (SBRT).    PREVIOUS RADIATION THERAPY: {EXAM; YES/NO:19492::"No"}   PAST MEDICAL HISTORY:  Past Medical History:  Diagnosis Date   Bilateral leg edema 08/24/2019   Cancer (HCC)    skin ca, colon   Chronic cough    Chronic pain of left knee 12/01/2015   COVID-19    06/2019   Dysrhythmia    afib  was cardioverted   Essential hypertension 08/24/2019   Mixed hyperlipidemia 08/24/2019   Obesity (BMI 30-39.9) 08/24/2019   Persistent atrial fibrillation (HCC) 08/24/2019       PAST SURGICAL HISTORY: Past Surgical History:  Procedure Laterality Date   ACHILLES TENDON REPAIR Left 2012   Ruptured   BRONCHIAL BIOPSY  02/09/2022   Procedure: BRONCHIAL BIOPSIES;  Surgeon: Josephine Igo, DO;  Location: [facility];  Service: Pulmonary;;   BRONCHIAL NEEDLE ASPIRATION BIOPSY  02/09/2022   Procedure: BRONCHIAL NEEDLE ASPIRATION BIOPSIES;  Surgeon: Josephine Igo, DO;  Location: [facility];  Service: Pulmonary;;   COLONOSCOPY WITH PROPOFOL N/A 12/11/2021   Procedure: COLONOSCOPY WITH PROPOFOL;  Surgeon: Jeani Hawking, MD;  Location: [facility];  Service: Gastroenterology;  Laterality: N/A;   HERNIA REPAIR     umbilical hernia   IR IMAGING GUIDED PORT INSERTION  02/18/2022   SKIN SURGERY Left    Basal cell carcinoma on left forearm   XI ROBOTIC ASSISTED LOWER ANTERIOR RESECTION N/A 01/09/2021   Procedure: XI ROBOTIC ASSISTED RIGHT HEMI COLECTOMY, LYSIS OF ADHESIONS, SMALL BOWEL RESECTION,  INTRAOPERATIVE ASSESMENT OF PERFUSION, PARTIAL OMENTECTOMY;  Surgeon: Andria Meuse, MD;  Location: [facility];  Service: General;  Laterality: N/A;     FAMILY HISTORY:  Family History  Problem Relation Age of Onset   Lung cancer Father      SOCIAL HISTORY:  reports that he has never smoked. He has never used smokeless tobacco. He reports that he does not drink alcohol and does not use drugs.   ALLERGIES: Furosemide and Tramadol   MEDICATIONS:  Current Outpatient  Medications  Medication Sig Dispense Refill   acetaminophen (TYLENOL) 500 MG tablet Take 500-1,000 mg by mouth every 6 (six) hours as needed (pain.).     amLODipine (NORVASC) 10 MG tablet Take 1 tablet (10 mg total) by mouth daily. 30 tablet 1   Ascorbic Acid (VITAMIN C WITH ROSE HIPS) 500 MG tablet Take 500 mg by mouth every evening.     Bacillus Coagulans-Inulin (ALIGN PREBIOTIC-PROBIOTIC PO) Take by mouth.     carvedilol (COREG) 6.25 MG tablet Take 1 tablet (6.25 mg total) by mouth 2 (two) times daily. 180 tablet 3   Cholecalciferol (D3-1000) 25 MCG (1000 UT) capsule Take 1,000 Units by mouth every evening.     diphenoxylate-atropine (LOMOTIL) 2.5-0.025 MG tablet Take 1 tablet by mouth 4 (four) times daily as needed for diarrhea or loose stools. 30 tablet 2   ELIQUIS 5 MG TABS tablet Take 1 tablet (5 mg total) by mouth 2 (two) times daily. 180 tablet 1   Misc Natural Products (JOINT SUPPORT PO) Take 1 tablet by mouth daily. Instaflex Advanced Joint Support     nitroGLYCERIN (NITROSTAT) 0.4 MG SL tablet Place 1 tablet (0.4 mg total) under the tongue every 5 (five) minutes as needed. 25 tablet 7   potassium chloride SA (KLOR-CON M) 20 MEQ tablet Take 1 tablet (20 mEq total) by mouth daily. 30 tablet 1   sacubitril-valsartan (ENTRESTO) 24-26 MG Take 1 tablet by mouth 2 (two) times daily. 180 tablet 3   spironolactone (ALDACTONE) 50 MG tablet Take 1 tablet (50 mg total) by mouth daily. 10 tablet 0   vitamin B-12 (CYANOCOBALAMIN) 1000 MCG tablet Take 1,000 mcg by mouth every evening.     Zinc 50 MG CAPS Take 50 mg by mouth every evening.     No current facility-administered medications for this visit.   Facility-Administered Medications Ordered in Other Visits  Medication Dose Route Frequency Provider Last Rate Last Admin   sodium chloride flush (NS) 0.9 % injection 10 mL  10 mL Intracatheter PRN Malachy Mood, MD   10 mL at 11/19/22 0910     REVIEW OF SYSTEMS: On review of systems, the patient  reports that *** is doing well overall. *** denies any chest pain, shortness of breath, cough, fevers, chills, night sweats, unintended weight changes. *** denies any bowel or bladder disturbances, and denies abdominal pain, nausea or vomiting. *** denies any new musculoskeletal or joint aches or pains. A complete review of systems is obtained and is otherwise negative.     PHYSICAL EXAM:  Wt Readings from Last 3 Encounters:  02/24/23 214 lb 3.2 oz (97.2 kg)  12/23/22 215 lb 8 oz (97.8 kg)  12/08/22 215 lb 12.8 oz (97.9 kg)   Temp Readings from Last 3 Encounters:  02/24/23 (!) 97.5 F (36.4 C) (Oral)  12/23/22 97.8 F (36.6 C) (Oral)  12/10/22 98.1 F (36.7 C) (Oral)   BP Readings from Last 3 Encounters:  02/24/23 (!) 142/80  12/23/22 (!) 153/78  12/10/22 (!) 166/80   Pulse Readings from Last 3 Encounters:  02/24/23 60  12/23/22 63  12/10/22 77    /10  In general this is a well appearing *** in no acute distress. ***'s alert and oriented x4 and appropriate throughout the examination. Cardiopulmonary assessment is negative for acute distress and *** exhibits normal effort.     ECOG = ***  0 - Asymptomatic (Fully active, able to carry on all predisease activities without restriction)  1 - Symptomatic but completely ambulatory (Restricted in physically strenuous activity but ambulatory and able to carry out work of a light or sedentary nature. For example, light housework, office work)  2 - Symptomatic, <50% in bed during the day (Ambulatory and capable of all self care but unable to carry out any work activities. Up and about more than 50% of waking hours)  3 - Symptomatic, >50% in bed, but not bedbound (Capable of only limited self-care, confined to bed or chair 50% or more of waking hours)  4 - Bedbound (Completely disabled. Cannot carry on any self-care. Totally confined to bed or chair)  5 - Death   Santiago Glad MM, Creech RH, Tormey DC, et al. [phone]). "Toxicity and response  criteria of the [facility] Group". Am. Evlyn Clines. Oncol. 5 (6): 649-55    LABORATORY DATA:  Lab Results  Component Value Date   WBC 5.8 02/14/2023   HGB 15.4 02/14/2023   HCT 44.9 02/14/2023   MCV 97.0 02/14/2023   PLT 145 (L) 02/14/2023   Lab Results  Component Value Date   NA 141 02/14/2023   K 3.8 02/14/2023   CL 107 02/14/2023   CO2 28 02/14/2023   Lab Results  Component Value Date   ALT 13 02/14/2023   AST 16 02/14/2023   ALKPHOS 50 02/14/2023   BILITOT 0.5 02/14/2023      RADIOGRAPHY: CT CHEST ABDOMEN PELVIS WO CONTRAST  Result Date: 02/21/2023 CLINICAL DATA:  Stage IV colon cancer, most recent chemotherapy June 2024. Restaging. * Tracking Code: BO * EXAM: CT CHEST, ABDOMEN AND PELVIS WITHOUT CONTRAST TECHNIQUE: Multidetector CT imaging of the chest, abdomen and pelvis was performed following the standard protocol without IV contrast. RADIATION DOSE REDUCTION: This exam was performed according to the departmental dose-optimization program which includes automated exposure control, adjustment of the mA and/or kV according to patient size and/or use of iterative reconstruction technique. COMPARISON:  11/15/2022 CT chest, abdomen and pelvis. FINDINGS: CT CHEST FINDINGS Cardiovascular: Normal heart size. No significant pericardial effusion/thickening. Left anterior descending coronary atherosclerosis. Right internal jugular Port-A-Cath terminates at the lower third of the SVC. Atherosclerotic nonaneurysmal thoracic aorta. Normal caliber pulmonary arteries. Mediastinum/Nodes: Stable hypodense 1.7 cm left thyroid nodule. This has been evaluated on previous imaging (02/09/2021 thyroid ultrasound). (Ref: J Am Coll Radiol. 2015 Feb;12(2): 143-50). Unremarkable esophagus. No pathologically enlarged axillary, mediastinal or hilar lymph nodes, noting limited sensitivity for the detection of hilar adenopathy on this noncontrast study. Lungs/Pleura: No pneumothorax. No pleural  effusion. Indistinct posterior right upper lobe 0.7 x 0.4 cm pulmonary nodule (series 605/image 44), previously 0.5 x 0.2 cm, slightly increased. Separate 0.3 cm right upper lobe pulmonary nodule (series 605/image 51), slightly increased from 0.2 cm. Subpleural peripheral right upper lobe 1.1 cm pulmonary nodule (series 605/image 54), stable. No acute consolidative airspace disease or lung masses. No new significant pulmonary nodules. Patchy mild-to-moderate subpleural reticulation and ground-glass opacity in the lower lungs with associated mild traction bronchiectasis, unchanged. Patchy ground-glass opacity in the parahilar  upper right lung is unchanged. Musculoskeletal: No aggressive appearing focal osseous lesions. Moderate thoracic spondylosis. CT ABDOMEN PELVIS FINDINGS Hepatobiliary: Normal liver size. Hypodense 0.7 cm anterior liver lesion, too small to characterize, unchanged. No new liver lesions. Normal gallbladder with no radiopaque cholelithiasis. No biliary ductal dilatation. Pancreas: Normal, with no mass or duct dilation. Spleen: Normal size. No mass. Adrenals/Urinary Tract: Normal adrenals. No renal stones. No hydronephrosis. Exophytic 2.4 cm lateral lower left renal low-attenuation cyst, homogeneous and stable size, for which no follow-up imaging is recommended. No additional contour deforming renal lesions. Mild diffuse bladder wall thickening, chronic and unchanged. Stomach/Bowel: Normal non-distended stomach. Stable postsurgical changes from right hemicolectomy with intact appearing ileocolic anastomosis in the anterior upper abdomen. No dilated or thick-walled small bowel loops. Marked left colonic diverticulosis with no large bowel wall thickening or acute pericolonic fat stranding. Vascular/Lymphatic: Atherosclerotic nonaneurysmal abdominal aorta. No pathologically enlarged lymph nodes in the abdomen or pelvis. Reproductive: Marked prostatomegaly. Other: No pneumoperitoneum, ascites or focal  fluid collection. Musculoskeletal: No aggressive appearing focal osseous lesions. Marked lumbar spondylosis. IMPRESSION: 1. Two small right upper lobe pulmonary nodules are slightly increased in size, largest 0.7 cm, cannot exclude slightly growing pulmonary metastases. 2. No additional potential findings of metastatic disease in the chest, abdomen or pelvis. 3. Spectrum of findings compatible with basilar predominant fibrotic interstitial lung disease, unchanged. 4. Marked prostatomegaly. Mild diffuse bladder wall thickening, chronic and unchanged, probably due to chronic bladder outlet obstruction. 5. Marked left colonic diverticulosis. 6.  Aortic Atherosclerosis (ICD10-I70.0). Electronically Signed   By: Delbert Phenix M.D.   On: 02/21/2023 13:03       IMPRESSION/PLAN: 1. Recurrent Metastatic Stage IIA, pT3N0M0, adenocarcinoma of the right colon involving the RUL. Dr. Mitzi Hansen discusses the pathology findings and reviews the nature of  We discussed the risks, benefits, short, and long term effects of radiotherapy, as well as the curative intent, and the patient is interested in proceeding. Dr. Mitzi Hansen discusses the delivery and logistics of radiotherapy and anticipates a course of ***     In a visit lasting *** minutes, greater than 50% of the time was spent face to face discussing the patient's condition, in preparation for the discussion, and coordinating the patient's care.   The above documentation reflects my direct findings during this shared patient visit. Please see the separate note by Dr. Mitzi Hansen on this date for the remainder of the patient's plan of care.    Osker Mason, [facility]   **Disclaimer: This note was dictated with voice recognition software. Similar sounding words can inadvertently be transcribed and this note may contain transcription errors which may not have been corrected upon publication of note.**

## 2023-03-15 ENCOUNTER — Ambulatory Visit
Admission: RE | Admit: 2023-03-15 | Discharge: 2023-03-15 | Disposition: A | Discharge status: Home / Self Care | Payer: Medicare HMO | Source: Ambulatory Visit | Attending: Radiation Oncology | Admitting: Radiation Oncology

## 2023-03-15 ENCOUNTER — Encounter: Payer: Self-pay | Admitting: Radiation Oncology

## 2023-03-15 VITALS — BP 177/93 | HR 46 | Temp 97.8°F | Resp 20 | Ht 71.0 in | Wt 221.2 lb

## 2023-03-15 DIAGNOSIS — I2510 Atherosclerotic heart disease of native coronary artery without angina pectoris: Secondary | ICD-10-CM | POA: Insufficient documentation

## 2023-03-15 DIAGNOSIS — Z801 Family history of malignant neoplasm of trachea, bronchus and lung: Secondary | ICD-10-CM | POA: Diagnosis not present

## 2023-03-15 DIAGNOSIS — Z79899 Other long term (current) drug therapy: Secondary | ICD-10-CM | POA: Insufficient documentation

## 2023-03-15 DIAGNOSIS — C189 Malignant neoplasm of colon, unspecified: Secondary | ICD-10-CM

## 2023-03-15 DIAGNOSIS — C182 Malignant neoplasm of ascending colon: Secondary | ICD-10-CM | POA: Insufficient documentation

## 2023-03-15 DIAGNOSIS — E041 Nontoxic single thyroid nodule: Secondary | ICD-10-CM | POA: Diagnosis not present

## 2023-03-15 DIAGNOSIS — K5730 Diverticulosis of large intestine without perforation or abscess without bleeding: Secondary | ICD-10-CM | POA: Insufficient documentation

## 2023-03-15 DIAGNOSIS — M47814 Spondylosis without myelopathy or radiculopathy, thoracic region: Secondary | ICD-10-CM | POA: Diagnosis not present

## 2023-03-15 DIAGNOSIS — I4819 Other persistent atrial fibrillation: Secondary | ICD-10-CM | POA: Diagnosis not present

## 2023-03-15 DIAGNOSIS — Z8616 Personal history of COVID-19: Secondary | ICD-10-CM | POA: Insufficient documentation

## 2023-03-15 DIAGNOSIS — I700 Atherosclerosis of aorta: Secondary | ICD-10-CM | POA: Diagnosis not present

## 2023-03-15 DIAGNOSIS — E782 Mixed hyperlipidemia: Secondary | ICD-10-CM | POA: Diagnosis not present

## 2023-03-15 DIAGNOSIS — I10 Essential (primary) hypertension: Secondary | ICD-10-CM | POA: Diagnosis not present

## 2023-03-15 DIAGNOSIS — K769 Liver disease, unspecified: Secondary | ICD-10-CM | POA: Insufficient documentation

## 2023-03-15 DIAGNOSIS — G8929 Other chronic pain: Secondary | ICD-10-CM | POA: Diagnosis not present

## 2023-03-15 DIAGNOSIS — C7801 Secondary malignant neoplasm of right lung: Secondary | ICD-10-CM | POA: Diagnosis not present

## 2023-03-15 DIAGNOSIS — N400 Enlarged prostate without lower urinary tract symptoms: Secondary | ICD-10-CM | POA: Diagnosis not present

## 2023-03-15 DIAGNOSIS — R609 Edema, unspecified: Secondary | ICD-10-CM | POA: Insufficient documentation

## 2023-03-15 DIAGNOSIS — Z7901 Long term (current) use of anticoagulants: Secondary | ICD-10-CM | POA: Insufficient documentation

## 2023-03-16 DIAGNOSIS — C189 Malignant neoplasm of colon, unspecified: Secondary | ICD-10-CM | POA: Diagnosis not present

## 2023-03-16 DIAGNOSIS — C7801 Secondary malignant neoplasm of right lung: Secondary | ICD-10-CM | POA: Diagnosis not present

## 2023-03-17 DIAGNOSIS — Z008 Encounter for other general examination: Secondary | ICD-10-CM | POA: Diagnosis not present

## 2023-03-18 ENCOUNTER — Encounter: Payer: Self-pay | Admitting: Hematology

## 2023-03-23 ENCOUNTER — Ambulatory Visit
Admission: RE | Admit: 2023-03-23 | Discharge: 2023-03-23 | Disposition: A | Discharge status: Home / Self Care | Payer: Medicare HMO | Source: Ambulatory Visit | Attending: Radiation Oncology | Admitting: Radiation Oncology

## 2023-03-23 DIAGNOSIS — C182 Malignant neoplasm of ascending colon: Secondary | ICD-10-CM | POA: Diagnosis not present

## 2023-03-23 DIAGNOSIS — C7801 Secondary malignant neoplasm of right lung: Secondary | ICD-10-CM | POA: Diagnosis not present

## 2023-03-24 ENCOUNTER — Other Ambulatory Visit: Payer: Self-pay

## 2023-03-24 ENCOUNTER — Telehealth: Payer: Self-pay

## 2023-03-24 DIAGNOSIS — C7801 Secondary malignant neoplasm of right lung: Secondary | ICD-10-CM | POA: Diagnosis not present

## 2023-03-24 DIAGNOSIS — C189 Malignant neoplasm of colon, unspecified: Secondary | ICD-10-CM | POA: Diagnosis not present

## 2023-03-24 NOTE — Telephone Encounter (Signed)
Please refill patients Lomotil. Send to [facility] in [place]

## 2023-04-04 ENCOUNTER — Ambulatory Visit
Admission: RE | Admit: 2023-04-04 | Discharge: 2023-04-04 | Disposition: A | Discharge status: Home / Self Care | Payer: Medicare HMO | Source: Ambulatory Visit | Attending: Radiation Oncology | Admitting: Radiation Oncology

## 2023-04-04 DIAGNOSIS — Z510 Encounter for antineoplastic radiation therapy: Secondary | ICD-10-CM | POA: Insufficient documentation

## 2023-04-04 DIAGNOSIS — C182 Malignant neoplasm of ascending colon: Secondary | ICD-10-CM | POA: Insufficient documentation

## 2023-04-04 DIAGNOSIS — C7801 Secondary malignant neoplasm of right lung: Secondary | ICD-10-CM | POA: Insufficient documentation

## 2023-04-05 DIAGNOSIS — C189 Malignant neoplasm of colon, unspecified: Secondary | ICD-10-CM | POA: Diagnosis not present

## 2023-04-05 DIAGNOSIS — Z510 Encounter for antineoplastic radiation therapy: Secondary | ICD-10-CM | POA: Diagnosis not present

## 2023-04-05 DIAGNOSIS — C7801 Secondary malignant neoplasm of right lung: Secondary | ICD-10-CM | POA: Diagnosis not present

## 2023-04-05 DIAGNOSIS — C182 Malignant neoplasm of ascending colon: Secondary | ICD-10-CM | POA: Diagnosis not present

## 2023-04-06 ENCOUNTER — Other Ambulatory Visit: Payer: Self-pay

## 2023-04-06 DIAGNOSIS — C7801 Secondary malignant neoplasm of right lung: Secondary | ICD-10-CM | POA: Diagnosis not present

## 2023-04-06 DIAGNOSIS — Z510 Encounter for antineoplastic radiation therapy: Secondary | ICD-10-CM | POA: Diagnosis not present

## 2023-04-06 DIAGNOSIS — C182 Malignant neoplasm of ascending colon: Secondary | ICD-10-CM | POA: Diagnosis not present

## 2023-04-06 LAB — RAD ONC ARIA SESSION SUMMARY
Course Elapsed Days: 0
Plan Fractions Treated to Date: 1
Plan Fractions Treated to Date: 1
Plan Prescribed Dose Per Fraction: 18 Gy
Plan Prescribed Dose Per Fraction: 18 Gy
Plan Total Fractions Prescribed: 3
Plan Total Fractions Prescribed: 3
Plan Total Prescribed Dose: 54 Gy
Plan Total Prescribed Dose: 54 Gy
Reference Point Dosage Given to Date: 18 Gy
Reference Point Dosage Given to Date: 18 Gy
Reference Point Session Dosage Given: 18 Gy
Reference Point Session Dosage Given: 18 Gy
Session Number: 1

## 2023-04-06 NOTE — Assessment & Plan Note (Addendum)
-  stage II (pT3, N0), metastatic disease to lung and possible nodes in July 2023, MSS, KRAS/NRAS/BRAF wild type   -found on screening colonoscopy. S/p right hemicolectomy 01/09/21. -lung metastasis confirmed in RUL by bronchoscopy on 02/09/22. -[facility] showed MSS, low tumor burden, KRAS/NRAS/BRAF wild-type, no other targetable mutations. The benefit of EGFR inhibitor is much less in right-sided colon cancer, so I will not give it as first line but may consider in subsequent lines.  -he started FOLFOX on 03/03/22. He tolerated well overall with diarrhea for several days. Bevacizumab added with C2 (9/20).  -CT scan 05/26/2022 showed improved lung mets, but showed new infiltrative lung change, possible related to his recent URI -repeated CT chest on 07/26/2022 showed stable known lung mets (overall excellent response, with minimum residual dsease) and resolved previously see inflammatory change in RLL -I have changed treatment to maintenance xeloda and beva every 3 weeks, he started C1 on 08/18/2022.  -due to recurrent diarrhea, I stopped Xeloda after 2 cycles -we have changed his treatment to 5-fu pump infusion and beva every 2 weeks on 4/24, he tolerated reasonably well, diarrhea has resolved now. -Restaging CT scan yesterday showed stable appearance of previously scattered small pulmonary nodules, measuring 4-38mm, no new lesions.  Overall has much improved compared to his initial CT scan from July 2023, and it is a hard to tell if this is residual disease versus scar tissue.  -We stopped his chemo after last scan and he is on cancer surveillance now. -CT scan from February 14, 2023, which showed slightly increase the size of the 2 nodule in the right lung, most consistent with disease progression.  No other new metastasis. -I recommend SBRT radiation, he is open to the treatment, -CT simunation was done 03/23/2023. Started radiation treatment 04/06/2023. He is to have total of 3 treatments. Second  treatment is 04/08/2023 and third is 04/11/2023.  -repeat CT CAP will be scheduled for 7 to 8 weeks and follow up a week after scan with labs.

## 2023-04-06 NOTE — Progress Notes (Unsigned)
Patient Care Team: Noni Saupe, MD as PCP - General (Family Medicine) [name], DO as PCP - Cardiology (Cardiology) Malachy Mood, MD as Attending Physician (Hematology and Oncology)  Clinic Day:  04/06/2023  Referring physician: Noni Saupe, MD  ASSESSMENT & PLAN:   Assessment & Plan: Cancer of right colon [facility]) -stage II (pT3, N0), metastatic disease to lung and possible nodes in July 2023, MSS, KRAS/NRAS/BRAF wild type   -found on screening colonoscopy. S/p right hemicolectomy 01/09/21. -lung metastasis confirmed in RUL by bronchoscopy on 02/09/22. -[employer] showed MSS, low tumor burden, KRAS/NRAS/BRAF wild-type, no other targetable mutations. The benefit of EGFR inhibitor is much less in right-sided colon cancer, so I will not give it as first line but may consider in subsequent lines.  -he started FOLFOX on 03/03/22. He tolerated well overall with diarrhea for several days. Bevacizumab added with C2 (9/20).  -CT scan 05/26/2022 showed improved lung mets, but showed new infiltrative lung change, possible related to his recent URI -repeated CT chest on 07/26/2022 showed stable known lung mets (overall excellent response, with minimum residual dsease) and resolved previously see inflammatory change in RLL -I have changed treatment to maintenance xeloda and beva every 3 weeks, he started C1 on 08/18/2022.  -due to recurrent diarrhea, I stopped Xeloda after 2 cycles -we have changed his treatment to 5-fu pump infusion and beva every 2 weeks on 4/24, he tolerated reasonably well, diarrhea has resolved now. -Restaging CT scan yesterday showed stable appearance of previously scattered small pulmonary nodules, measuring 4-46mm, no new lesions.  Overall has much improved compared to his initial CT scan from July 2023, and it is a hard to tell if this is residual disease versus scar tissue.  -We stopped his chemo after last scan and he is on cancer surveillance now. -CT scan from  February 14, 2023, which showed slightly increase the size of the 2 nodule in the right lung, most consistent with disease progression.  No other new metastasis. -I recommend SBRT radiation, he is open to the treatment, -CT simunation was done 03/23/2023. Appear to be starting radiation treatment 04/06/2023     The patient understands the plans discussed today and is in agreement with them.  He knows to contact our office if he develops concerns prior to his next appointment.  I provided *** minutes of face-to-face time during this encounter and > 50% was spent counseling as documented under my assessment and plan.    Carlean Jews, NP  [place] [facility] [address] [place] [address] Dept: [phone] Dept Fax: [phone]   No orders of the defined types were placed in this encounter.     CHIEF COMPLAINT:  CC: right colon cancer with metastases to lung  Current Treatment:  radiation to lung metastases.   INTERVAL HISTORY:  Thomas Knight is here today for repeat clinical assessment. He denies fevers or chills. He denies pain. His appetite is good. His weight {Weight change:10426}.  I have reviewed the past medical history, past surgical history, social history and family history with the patient and they are unchanged from previous note.  ALLERGIES:  is allergic to furosemide and tramadol.  MEDICATIONS:  Current Outpatient Medications  Medication Sig Dispense Refill   acetaminophen (TYLENOL) 500 MG tablet Take 500-1,000 mg by mouth every 6 (six) hours as needed (pain.).     amLODipine (NORVASC) 10 MG tablet Take 1 tablet (10 mg total)  by mouth daily. (Patient not taking: Reported on 03/15/2023) 30 tablet 1   Ascorbic Acid (VITAMIN C WITH ROSE HIPS) 500 MG tablet Take 500 mg by mouth every evening.     Bacillus Coagulans-Inulin (ALIGN PREBIOTIC-PROBIOTIC PO) Take by mouth.     carvedilol (COREG) 6.25 MG tablet  Take 1 tablet (6.25 mg total) by mouth 2 (two) times daily. 180 tablet 3   Cholecalciferol (D3-1000) 25 MCG (1000 UT) capsule Take 1,000 Units by mouth every evening.     diphenoxylate-atropine (LOMOTIL) 2.5-0.025 MG tablet Take 1 tablet by mouth 4 (four) times daily as needed for diarrhea or loose stools. 30 tablet 2   ELIQUIS 5 MG TABS tablet Take 1 tablet (5 mg total) by mouth 2 (two) times daily. 180 tablet 1   Misc Natural Products (JOINT SUPPORT PO) Take 1 tablet by mouth daily. Instaflex Advanced Joint Support     nitroGLYCERIN (NITROSTAT) 0.4 MG SL tablet Place 1 tablet (0.4 mg total) under the tongue every 5 (five) minutes as needed. (Patient not taking: Reported on 03/15/2023) 25 tablet 7   potassium chloride SA (KLOR-CON M) 20 MEQ tablet Take 1 tablet (20 mEq total) by mouth daily. (Patient not taking: Reported on 03/15/2023) 30 tablet 1   sacubitril-valsartan (ENTRESTO) 24-26 MG Take 1 tablet by mouth 2 (two) times daily. 180 tablet 3   vitamin B-12 (CYANOCOBALAMIN) 1000 MCG tablet Take 1,000 mcg by mouth every evening.     Zinc 50 MG CAPS Take 50 mg by mouth every evening.     No current facility-administered medications for this visit.   Facility-Administered Medications Ordered in Other Visits  Medication Dose Route Frequency Provider Last Rate Last Admin   sodium chloride flush (NS) 0.9 % injection 10 mL  10 mL Intracatheter PRN Malachy Mood, MD   10 mL at 11/19/22 0910    HISTORY OF PRESENT ILLNESS:   Oncology History Overview Note   Cancer Staging  Cancer of right colon [facility]) Staging form: Colon and Rectum, AJCC 8th Edition - Pathologic stage from 01/09/2021: Stage IIA (pT3, pN0, cM0) - Signed by Malachy Mood, MD on 02/12/2022 Total positive nodes: 0 Histologic grading system: 4 grade system Histologic grade (G): G2 Residual tumor (R): R0 - None     Cancer of right colon (HCC)  10/2020 Procedure   Colonoscopy-Dr. Elnoria Howard     11/06/2020 Imaging   CT CHEST ABDOMEN PELVIS W  CONTRAST   IMPRESSION: 1. 4.6 cm partially circumferential apple-core type neoplastic process involving the mid transverse colon. No findings for locoregional adenopathy or distant metastatic disease. 2. Enlarged prostate gland with median lobe hypertrophy impressing on the base of the bladder. 3. 16 mm left thyroid nodule. Recommend follow-up thyroid ultrasound examination.   01/09/2021 Initial Diagnosis   Colon cancer (HCC)   01/09/2021 Pathology Results   B. COLON, RIGHT, RESECTION:  -  Adenocarcinoma, moderately differentiated, 3.0 cm  -  No carcinoma identified in seventeen lymph nodes (0/17)  -  Margins uninvolved by carcinoma  -  Tubular adenoma (x2)  -  Benign appendix  -  See oncology table and comment below   Margin Status for Invasive Carcinoma: All margins negative for  invasive carcinoma       Distance from Invasive Carcinoma to Radial (Circumferential)       Margin Status for Non-Invasive Tumor: All margins negative for  high-grade dysplasia / intramucosal carcinoma and low-grade dysplasia  Regional Lymph Nodes:       Number of Lymph  Nodes with Tumor: 0       Number of Lymph Nodes Examined: 17  Tumor Deposits: 0  Distant Metastasis:       Distant Site(s) Involved: Not applicable  Pathologic Stage Classification (pTNM, AJCC 8th Edition): pT3, pN0   MMR Stable    01/09/2021 Cancer Staging   Staging form: Colon and Rectum, AJCC 8th Edition - Pathologic stage from 01/09/2021: Stage IIA (pT3, pN0, cM0) - Signed by Malachy Mood, MD on 02/12/2022 Total positive nodes: 0 Histologic grading system: 4 grade system Histologic grade (G): G2 Residual tumor (R): R0 - None   05/2021 Tumor Marker   Patient's tumor was tested for the following markers: CEA. Results of the tumor marker test revealed 2.1.   12/11/2021 Procedure   Colonoscopy-Dr. Elnoria Howard  There was evidence of a prior functional end-to-end ileo-colonic anastomosis in the transverse colon. This was patent and was  characterized by healthy appearing mucosa. The anastomosis was traversed. Findings: Scattered small and large-mouthed diverticula were found in the sigmoid colon. - Patent functional end-to-end ileo-colonic anastomosis, characterized by healthy appearing mucosa. - Diverticulosis in the sigmoid colon. - No specimens collected.   01/25/2022 Imaging   CT CHEST ABDOMEN PELVIS W CONTRAST   IMPRESSION: 1. New right-sided pulmonary nodules measure up to 11 mm, nonspecific but concerning for metastatic disease. Consider further evaluation with nuclear medicine PET/CT. 2. New nodularity/lymph nodes in the central mesentery, concerning for nodal disease involvement. 3. Slightly increased size of prominent retroperitoneal lymph nodes, nonspecific but also somewhat concerning for nodal disease involvement. 4. Surgical change of partial colectomy without evidence of local recurrence. 5. Stable prominent mediastinal lymph nodes, nonspecific but stability is reassuring. Attention on follow-up imaging suggested. 6. Stable prominent right external iliac lymph node, nonspecific but stability is reassuring. Attention on follow-up imaging suggested. 7. Stable hypodense 8 mm hepatic lesion technically too small to accurately characterize but stability is reassuring. Attention on follow-up imaging suggested. 8. 1.9 cm incidental left thyroid nodule. Recommend thyroid US. Reference: J Am Coll Radiol. 2015 Feb;12(2): 143-50 9.  Aortic Atherosclerosis (ICD10-I70.0).   02/09/2022 Procedure   Bronchoscopy under the care of Dr. Tonia Brooms    02/09/2022 Pathology Results   FINAL MICROSCOPIC DIAGNOSIS:   A. LUNG, RUL, FINE NEEDLE ASPIRATION  BIOPSY FORCEPS:  Adenocarcinoma with morphologic features consistent with metastasis from a colorectal primary.  Please see comment.   Comment: The following immunostains are performed with appropriate controls :  TTF-1: Negative .  Napsin A: Negative .  CK7: Negative.   CK20: Positive.  CDX2: Positive .   The above immuno histochemical profile is supportive of the rendered diagnosis.    08/18/2022 - 08/18/2022 Chemotherapy   Patient is on Treatment Plan : COLORECTAL Bevacizumab q14d     Colon cancer metastasized to lung [facility])  02/09/2022 Pathology Results   FINAL MICROSCOPIC DIAGNOSIS:   A. LUNG, RUL, FINE NEEDLE ASPIRATION  BIOPSY FORCEPS:  Adenocarcinoma with morphologic features consistent with metastasis from a colorectal primary.  Please see comment.   Comment: The following immunostains are performed with appropriate controls :  TTF-1: Negative .  Napsin A: Negative .  CK7: Negative.  CK20: Positive.  CDX2: Positive .   The above immuno histochemical profile is supportive of the rendered diagnosis.    02/12/2022 Initial Diagnosis   Colon cancer metastasized to lung (HCC)   03/03/2022 - 07/31/2022 Chemotherapy   Patient is on Treatment Plan : COLORECTAL FOLFOX + Bevacizumab q14d     07/26/2022 Imaging  IMPRESSION: 1. Near-complete interval resolution of nodular and masslike areas of irregular consolidation, likely resolving infection or organizing pneumonia/drug reaction. 2. Additional previously seen pulmonary nodules are stable, consistent with treated pulmonary metastatic disease. 3. Mild peribronchovascular reticular opacities which are most pronounced in the mid and lower lungs, similar to prior exam, progressed when compared with prior dated January 15, 2022. Findings can be seen in the setting of interstitial lung disease, pattern is most consistent with fibrotic NSIP. 4. Aortic Atherosclerosis (ICD10-I70.0).   07/26/2022 Imaging    IMPRESSION: 1. Near-complete interval resolution of nodular and masslike areas of irregular consolidation, likely resolving infection or organizing pneumonia/drug reaction. 2. Additional previously seen pulmonary nodules are stable, consistent with treated pulmonary metastatic disease. 3. Mild  peribronchovascular reticular opacities which are most pronounced in the mid and lower lungs, similar to prior exam, progressed when compared with prior dated January 15, 2022. Findings can be seen in the setting of interstitial lung disease, pattern is most consistent with fibrotic NSIP. 4. Aortic Atherosclerosis (ICD10-I70.0).   09/08/2022 - 09/08/2022 Chemotherapy   Patient is on Treatment Plan : COLORECTAL Bevacizumab q21d     10/20/2022 -  Chemotherapy   Patient is on Treatment Plan : COLORECTAL 5FU + Leucovorin (Modified [place]) + Bevacizumab q14d         REVIEW OF SYSTEMS:   Constitutional: Denies fevers, chills or abnormal weight loss Eyes: Denies blurriness of vision Ears, nose, mouth, throat, and face: Denies mucositis or sore throat Respiratory: Denies cough, dyspnea or wheezes Cardiovascular: Denies palpitation, chest discomfort or lower extremity swelling Gastrointestinal:  Denies nausea, heartburn or change in bowel habits Skin: Denies abnormal skin rashes Lymphatics: Denies new lymphadenopathy or easy bruising Neurological:Denies numbness, tingling or new weaknesses Behavioral/Psych: Mood is stable, no new changes  All other systems were reviewed with the patient and are negative.   VITALS:  There were no vitals taken for this visit.  Wt Readings from Last 3 Encounters:  03/15/23 221 lb 3.2 oz (100.3 kg)  02/24/23 214 lb 3.2 oz (97.2 kg)  12/23/22 215 lb 8 oz (97.8 kg)    There is no height or weight on file to calculate BMI.  Performance status (ECOG): {CHL ONC [id number]  PHYSICAL EXAM:   GENERAL:alert, no distress and comfortable SKIN: skin color, texture, turgor are normal, no rashes or significant lesions EYES: normal, Conjunctiva are pink and non-injected, sclera clear OROPHARYNX:no exudate, no erythema and lips, buccal mucosa, and tongue normal  NECK: supple, thyroid normal size, non-tender, without nodularity LYMPH:  no palpable lymphadenopathy  in the cervical, axillary or inguinal LUNGS: clear to auscultation and percussion with normal breathing effort HEART: regular rate & rhythm and no murmurs and no lower extremity edema ABDOMEN:abdomen soft, non-tender and normal bowel sounds Musculoskeletal:no cyanosis of digits and no clubbing  NEURO: alert & oriented x 3 with fluent speech, no focal motor/sensory deficits  LABORATORY DATA:  I have reviewed the data as listed    Component Value Date/Time   NA 141 02/14/2023 1532   NA 142 02/20/2020 0912   K 3.8 02/14/2023 1532   CL 107 02/14/2023 1532   CO2 28 02/14/2023 1532   GLUCOSE 85 02/14/2023 1532   BUN 13 02/14/2023 1532   BUN 9 02/20/2020 0912   CREATININE 0.84 02/14/2023 1532   CALCIUM 8.6 (L) 02/14/2023 1532   PROT 6.7 02/14/2023 1532   ALBUMIN 3.7 02/14/2023 1532   AST 16 02/14/2023 1532   ALT 13 02/14/2023 1532   ALKPHOS  50 02/14/2023 1532   BILITOT 0.5 02/14/2023 1532   GFRNONAA >60 02/14/2023 1532   GFRAA 98 02/20/2020 0912    No results found for: "SPEP", "UPEP"  Lab Results  Component Value Date   WBC 5.8 02/14/2023   NEUTROABS 3.4 02/14/2023   HGB 15.4 02/14/2023   HCT 44.9 02/14/2023   MCV 97.0 02/14/2023   PLT 145 (L) 02/14/2023      Chemistry      Component Value Date/Time   NA 141 02/14/2023 1532   NA 142 02/20/2020 0912   K 3.8 02/14/2023 1532   CL 107 02/14/2023 1532   CO2 28 02/14/2023 1532   BUN 13 02/14/2023 1532   BUN 9 02/20/2020 0912   CREATININE 0.84 02/14/2023 1532      Component Value Date/Time   CALCIUM 8.6 (L) 02/14/2023 1532   ALKPHOS 50 02/14/2023 1532   AST 16 02/14/2023 1532   ALT 13 02/14/2023 1532   BILITOT 0.5 02/14/2023 1532       RADIOGRAPHIC STUDIES: I have personally reviewed the radiological images as listed and agreed with the findings in the report. No results found.

## 2023-04-07 ENCOUNTER — Other Ambulatory Visit: Payer: Self-pay

## 2023-04-07 ENCOUNTER — Inpatient Hospital Stay: Payer: Medicare HMO | Attending: Physician Assistant

## 2023-04-07 ENCOUNTER — Inpatient Hospital Stay (HOSPITAL_BASED_OUTPATIENT_CLINIC_OR_DEPARTMENT_OTHER): Payer: Medicare HMO | Admitting: Nurse Practitioner

## 2023-04-07 ENCOUNTER — Ambulatory Visit: Payer: Medicare HMO | Admitting: Radiation Oncology

## 2023-04-07 VITALS — BP 163/75 | HR 73 | Temp 97.3°F | Resp 14 | Wt 215.5 lb

## 2023-04-07 DIAGNOSIS — C182 Malignant neoplasm of ascending colon: Secondary | ICD-10-CM | POA: Diagnosis not present

## 2023-04-07 DIAGNOSIS — C189 Malignant neoplasm of colon, unspecified: Secondary | ICD-10-CM

## 2023-04-07 DIAGNOSIS — E860 Dehydration: Secondary | ICD-10-CM

## 2023-04-07 DIAGNOSIS — Z452 Encounter for adjustment and management of vascular access device: Secondary | ICD-10-CM | POA: Diagnosis not present

## 2023-04-07 DIAGNOSIS — C7800 Secondary malignant neoplasm of unspecified lung: Secondary | ICD-10-CM | POA: Insufficient documentation

## 2023-04-07 MED ORDER — SODIUM CHLORIDE 0.9% FLUSH
10.0000 mL | Freq: Once | INTRAVENOUS | Status: AC | PRN
  Administered 2023-04-07: 10 mL

## 2023-04-07 MED ORDER — SODIUM CHLORIDE 0.9 % IV SOLN: INTRAVENOUS | Status: DC

## 2023-04-07 MED ORDER — HEPARIN SOD (PORK) LOCK FLUSH 100 UNIT/ML IV SOLN
500.0000 [IU] | Freq: Once | INTRAVENOUS | Status: AC | PRN
  Administered 2023-04-07: 500 [IU]

## 2023-04-08 ENCOUNTER — Ambulatory Visit
Admission: RE | Admit: 2023-04-08 | Discharge: 2023-04-08 | Disposition: A | Discharge status: Home / Self Care | Payer: Medicare HMO | Source: Ambulatory Visit | Attending: Radiation Oncology | Admitting: Radiation Oncology

## 2023-04-08 ENCOUNTER — Other Ambulatory Visit: Payer: Self-pay

## 2023-04-08 DIAGNOSIS — C182 Malignant neoplasm of ascending colon: Secondary | ICD-10-CM | POA: Diagnosis not present

## 2023-04-08 DIAGNOSIS — Z510 Encounter for antineoplastic radiation therapy: Secondary | ICD-10-CM | POA: Diagnosis not present

## 2023-04-08 DIAGNOSIS — C7801 Secondary malignant neoplasm of right lung: Secondary | ICD-10-CM | POA: Diagnosis not present

## 2023-04-08 LAB — RAD ONC ARIA SESSION SUMMARY
Course Elapsed Days: 2
Plan Fractions Treated to Date: 2
Plan Fractions Treated to Date: 2
Plan Prescribed Dose Per Fraction: 18 Gy
Plan Prescribed Dose Per Fraction: 18 Gy
Plan Total Fractions Prescribed: 3
Plan Total Fractions Prescribed: 3
Plan Total Prescribed Dose: 54 Gy
Plan Total Prescribed Dose: 54 Gy
Reference Point Dosage Given to Date: 36 Gy
Reference Point Dosage Given to Date: 36 Gy
Reference Point Session Dosage Given: 18 Gy
Reference Point Session Dosage Given: 18 Gy
Session Number: 2

## 2023-04-09 ENCOUNTER — Other Ambulatory Visit: Payer: Self-pay

## 2023-04-11 ENCOUNTER — Ambulatory Visit
Admission: RE | Admit: 2023-04-11 | Discharge: 2023-04-11 | Disposition: A | Discharge status: Home / Self Care | Payer: Medicare HMO | Source: Ambulatory Visit | Attending: Radiation Oncology | Admitting: Radiation Oncology

## 2023-04-11 ENCOUNTER — Other Ambulatory Visit: Payer: Self-pay

## 2023-04-11 DIAGNOSIS — C189 Malignant neoplasm of colon, unspecified: Secondary | ICD-10-CM | POA: Diagnosis not present

## 2023-04-11 DIAGNOSIS — Z510 Encounter for antineoplastic radiation therapy: Secondary | ICD-10-CM | POA: Diagnosis not present

## 2023-04-11 DIAGNOSIS — C7801 Secondary malignant neoplasm of right lung: Secondary | ICD-10-CM | POA: Diagnosis not present

## 2023-04-11 DIAGNOSIS — C182 Malignant neoplasm of ascending colon: Secondary | ICD-10-CM | POA: Diagnosis not present

## 2023-04-11 LAB — RAD ONC ARIA SESSION SUMMARY
Course Elapsed Days: 5
Plan Fractions Treated to Date: 3
Plan Fractions Treated to Date: 3
Plan Prescribed Dose Per Fraction: 18 Gy
Plan Prescribed Dose Per Fraction: 18 Gy
Plan Total Fractions Prescribed: 3
Plan Total Fractions Prescribed: 3
Plan Total Prescribed Dose: 54 Gy
Plan Total Prescribed Dose: 54 Gy
Reference Point Dosage Given to Date: 54 Gy
Reference Point Dosage Given to Date: 54 Gy
Reference Point Session Dosage Given: 18 Gy
Reference Point Session Dosage Given: 18 Gy
Session Number: 3

## 2023-04-12 ENCOUNTER — Ambulatory Visit: Payer: Medicare HMO | Admitting: Radiation Oncology

## 2023-04-12 NOTE — Radiation Completion Notes (Signed)
Patient Name: Thomas Knight, Thomas Knight MRN: [id number] Date of Birth: 1945/12/01 Referring Physician: Malachy Mood, M.D. Date of Service: 2023-04-12 Radiation Oncologist: Dorothy Puffer, M.D. [facility]                             RADIATION ONCOLOGY END OF TREATMENT NOTE     Diagnosis: C78.01 Secondary malignant neoplasm of right lung Staging on 2021-01-09: Cancer of right colon (HCC) T=pT3, N=pN0, M=cM0 Intent: Curative     ==========DELIVERED PLANS==========  First Treatment Date: 2023-04-06 - Last Treatment Date: 2023-04-11   Plan Name: Lung_R_1_SBRT Site: Lung, Right Technique: SBRT/SRT-IMRT Mode: Photon Dose Per Fraction: 18 Gy Prescribed Dose (Delivered / Prescribed): 54 Gy / 54 Gy Prescribed Fxs (Delivered / Prescribed): 3 / 3   Plan Name: Lung_R_2_SBRT Site: Lung, Right Technique: SBRT/SRT-IMRT Mode: Photon Dose Per Fraction: 18 Gy Prescribed Dose (Delivered / Prescribed): 54 Gy / 54 Gy Prescribed Fxs (Delivered / Prescribed): 3 / 3     ==========ON TREATMENT VISIT DATES========== 2023-04-06, 2023-04-06, 2023-04-08, 2023-04-08, 2023-04-08, 2023-04-11, 2023-04-11     ==========UPCOMING VISITS==========       ==========APPENDIX - ON TREATMENT VISIT NOTES==========   See weekly On Treatment Notes in Epic for details.

## 2023-04-13 ENCOUNTER — Ambulatory Visit: Payer: Medicare HMO | Admitting: Radiation Oncology

## 2023-04-15 ENCOUNTER — Other Ambulatory Visit: Payer: Self-pay

## 2023-04-15 ENCOUNTER — Ambulatory Visit: Payer: Medicare HMO | Admitting: Radiation Oncology

## 2023-04-20 ENCOUNTER — Other Ambulatory Visit: Payer: Self-pay

## 2023-04-20 DIAGNOSIS — I480 Paroxysmal atrial fibrillation: Secondary | ICD-10-CM

## 2023-04-20 MED ORDER — ELIQUIS 5 MG PO TABS: 5.0000 mg | ORAL_TABLET | Freq: Two times a day (BID) | ORAL | 1 refills | Status: DC

## 2023-04-20 NOTE — Telephone Encounter (Signed)
Prescription refill request for Eliquis received. Indication: Afib  Last office visit: 11/29/22 ([place])  Scr: 0.84 (02/14/23)  Age: 77 Weight: 97.8kg  Appropriate dose. Refill sent.

## 2023-05-03 ENCOUNTER — Other Ambulatory Visit: Payer: Self-pay

## 2023-05-14 DIAGNOSIS — Z85820 Personal history of malignant melanoma of skin: Secondary | ICD-10-CM | POA: Diagnosis not present

## 2023-05-14 DIAGNOSIS — L821 Other seborrheic keratosis: Secondary | ICD-10-CM | POA: Diagnosis not present

## 2023-05-14 DIAGNOSIS — L814 Other melanin hyperpigmentation: Secondary | ICD-10-CM | POA: Diagnosis not present

## 2023-05-20 ENCOUNTER — Encounter: Payer: Self-pay | Admitting: Hematology

## 2023-05-30 ENCOUNTER — Inpatient Hospital Stay: Payer: Medicare HMO | Attending: Physician Assistant

## 2023-05-30 ENCOUNTER — Ambulatory Visit (HOSPITAL_COMMUNITY)
Admission: RE | Admit: 2023-05-30 | Discharge: 2023-05-30 | Disposition: A | Discharge status: Home / Self Care | Payer: Medicare HMO | Source: Ambulatory Visit | Attending: Nurse Practitioner | Admitting: Nurse Practitioner

## 2023-05-30 ENCOUNTER — Encounter (HOSPITAL_COMMUNITY): Payer: Self-pay

## 2023-05-30 DIAGNOSIS — E860 Dehydration: Secondary | ICD-10-CM

## 2023-05-30 DIAGNOSIS — J8410 Pulmonary fibrosis, unspecified: Secondary | ICD-10-CM | POA: Diagnosis not present

## 2023-05-30 DIAGNOSIS — C7800 Secondary malignant neoplasm of unspecified lung: Secondary | ICD-10-CM | POA: Diagnosis not present

## 2023-05-30 DIAGNOSIS — C182 Malignant neoplasm of ascending colon: Secondary | ICD-10-CM | POA: Insufficient documentation

## 2023-05-30 DIAGNOSIS — Z923 Personal history of irradiation: Secondary | ICD-10-CM | POA: Diagnosis not present

## 2023-05-30 DIAGNOSIS — J700 Acute pulmonary manifestations due to radiation: Secondary | ICD-10-CM | POA: Insufficient documentation

## 2023-05-30 DIAGNOSIS — Z452 Encounter for adjustment and management of vascular access device: Secondary | ICD-10-CM | POA: Diagnosis not present

## 2023-05-30 DIAGNOSIS — I700 Atherosclerosis of aorta: Secondary | ICD-10-CM | POA: Diagnosis not present

## 2023-05-30 DIAGNOSIS — K5730 Diverticulosis of large intestine without perforation or abscess without bleeding: Secondary | ICD-10-CM | POA: Diagnosis not present

## 2023-05-30 LAB — CBC WITH DIFFERENTIAL/PLATELET
Abs Immature Granulocytes: 0.02 10*3/uL (ref 0.00–0.07)
Basophils Absolute: 0.1 10*3/uL (ref 0.0–0.1)
Basophils Relative: 1 %
Eosinophils Absolute: 0.1 10*3/uL (ref 0.0–0.5)
Eosinophils Relative: 3 %
HCT: 44.5 % (ref 39.0–52.0)
Hemoglobin: 14.9 g/dL (ref 13.0–17.0)
Immature Granulocytes: 0 %
Lymphocytes Relative: 23 %
Lymphs Abs: 1.2 10*3/uL (ref 0.7–4.0)
MCH: 32.6 pg (ref 26.0–34.0)
MCHC: 33.5 g/dL (ref 30.0–36.0)
MCV: 97.4 fL (ref 80.0–100.0)
Monocytes Absolute: 0.6 10*3/uL (ref 0.1–1.0)
Monocytes Relative: 12 %
Neutro Abs: 3.2 10*3/uL (ref 1.7–7.7)
Neutrophils Relative %: 61 %
Platelets: 139 10*3/uL — ABNORMAL LOW (ref 150–400)
RBC: 4.57 MIL/uL (ref 4.22–5.81)
RDW: 15.8 % — ABNORMAL HIGH (ref 11.5–15.5)
WBC: 5.1 10*3/uL (ref 4.0–10.5)
nRBC: 0 % (ref 0.0–0.2)

## 2023-05-30 LAB — COMPREHENSIVE METABOLIC PANEL
ALT: 11 U/L (ref 0–44)
AST: 13 U/L — ABNORMAL LOW (ref 15–41)
Albumin: 3.7 g/dL (ref 3.5–5.0)
Alkaline Phosphatase: 55 U/L (ref 38–126)
Anion gap: 5 (ref 5–15)
BUN: 14 mg/dL (ref 8–23)
CO2: 29 mmol/L (ref 22–32)
Calcium: 8.6 mg/dL — ABNORMAL LOW (ref 8.9–10.3)
Chloride: 107 mmol/L (ref 98–111)
Creatinine, Ser: 0.87 mg/dL (ref 0.61–1.24)
GFR, Estimated: >60 mL/min (ref >60)
Glucose, Bld: 100 mg/dL — ABNORMAL HIGH (ref 70–99)
Potassium: 3.9 mmol/L (ref 3.5–5.1)
Sodium: 141 mmol/L (ref 135–145)
Total Bilirubin: 0.6 mg/dL (ref <1.2)
Total Protein: 6.3 g/dL — ABNORMAL LOW (ref 6.5–8.1)

## 2023-05-30 LAB — CEA (IN HOUSE-CHCC): CEA (CHCC-In House): 2.79 ng/mL (ref 0.00–5.00)

## 2023-05-30 MED ORDER — IOHEXOL 300 MG/ML  SOLN
100.0000 mL | Freq: Once | INTRAMUSCULAR | Status: AC | PRN
  Administered 2023-05-30: 100 mL via INTRAVENOUS

## 2023-05-30 MED ORDER — HEPARIN SOD (PORK) LOCK FLUSH 100 UNIT/ML IV SOLN
500.0000 [IU] | Freq: Once | INTRAVENOUS | Status: AC
  Administered 2023-05-30: 500 [IU] via INTRAVENOUS

## 2023-05-30 MED ORDER — SODIUM CHLORIDE 0.9% FLUSH
10.0000 mL | Freq: Once | INTRAVENOUS | Status: AC
Start: 2023-05-30 — End: 2023-05-30
  Administered 2023-05-30: 10 mL

## 2023-05-30 MED ORDER — HEPARIN SOD (PORK) LOCK FLUSH 100 UNIT/ML IV SOLN
500.0000 [IU] | Freq: Once | INTRAVENOUS | Status: AC
  Administered 2023-05-30: 500 [IU]

## 2023-05-30 MED ORDER — HEPARIN SOD (PORK) LOCK FLUSH 100 UNIT/ML IV SOLN
INTRAVENOUS | Status: AC
  Filled 2023-05-30: qty 5

## 2023-05-30 MED ORDER — IOHEXOL 300 MG/ML  SOLN
30.0000 mL | Freq: Once | INTRAMUSCULAR | Status: AC | PRN
  Administered 2023-05-30: 30 mL via ORAL

## 2023-05-31 DIAGNOSIS — M25512 Pain in left shoulder: Secondary | ICD-10-CM | POA: Diagnosis not present

## 2023-06-02 ENCOUNTER — Encounter: Payer: Self-pay | Admitting: Cardiology

## 2023-06-02 ENCOUNTER — Ambulatory Visit: Payer: Medicare HMO | Attending: Cardiology | Admitting: Cardiology

## 2023-06-02 ENCOUNTER — Ambulatory Visit (INDEPENDENT_AMBULATORY_CARE_PROVIDER_SITE_OTHER): Payer: Medicare HMO

## 2023-06-02 VITALS — BP 160/88 | HR 50 | Ht 71.0 in | Wt 231.6 lb

## 2023-06-02 DIAGNOSIS — I480 Paroxysmal atrial fibrillation: Secondary | ICD-10-CM

## 2023-06-02 DIAGNOSIS — I2510 Atherosclerotic heart disease of native coronary artery without angina pectoris: Secondary | ICD-10-CM

## 2023-06-02 DIAGNOSIS — G4733 Obstructive sleep apnea (adult) (pediatric): Secondary | ICD-10-CM

## 2023-06-02 DIAGNOSIS — I5032 Chronic diastolic (congestive) heart failure: Secondary | ICD-10-CM

## 2023-06-02 DIAGNOSIS — R0989 Other specified symptoms and signs involving the circulatory and respiratory systems: Secondary | ICD-10-CM

## 2023-06-02 DIAGNOSIS — I10 Essential (primary) hypertension: Secondary | ICD-10-CM | POA: Diagnosis not present

## 2023-06-02 MED ORDER — CARVEDILOL 12.5 MG PO TABS: 12.5000 mg | ORAL_TABLET | Freq: Two times a day (BID) | ORAL | 3 refills | Status: DC

## 2023-06-02 MED ORDER — NITROGLYCERIN 0.4 MG SL SUBL: 0.4000 mg | SUBLINGUAL_TABLET | SUBLINGUAL | 5 refills | Status: AC | PRN

## 2023-06-02 NOTE — Patient Instructions (Addendum)
Medication Instructions:  Your physician has recommended you make the following change in your medication:  INCREASE: Coreg (Carvedilol)  12.5 mg twice daily *If you need a refill on your cardiac medications before your next appointment, please call your pharmacy*   Testing/Procedures: Your physician has requested that you have an echocardiogram - [facility]. Echocardiography is a painless test that uses sound waves to create images of your heart. It provides your doctor with information about the size and shape of your heart and how well your heart's chambers and valves are working. This procedure takes approximately one hour. There are no restrictions for this procedure. Please do NOT wear cologne, perfume, aftershave, or lotions (deodorant is allowed). Please arrive 15 minutes prior to your appointment time.  Please note: We ask at that you not bring children with you during ultrasound (echo/ vascular) testing. Due to room size and safety concerns, children are not allowed in the ultrasound rooms during exams. Our front office staff cannot provide observation of children in our lobby area while testing is being conducted. An adult accompanying a patient to their appointment will only be allowed in the ultrasound room at the discretion of the ultrasound technician under special circumstances. We apologize for any inconvenience.  ZIO XT- Long Term Monitor Instructions  Your physician has requested you wear a ZIO patch monitor for 14 days.  This is a single patch monitor. [employer] supplies one patch monitor per enrollment. Additional stickers are not available. Please do not apply patch if you will be having a Nuclear Stress Test,  Echocardiogram, Cardiac CT, MRI, or Chest Xray during the period you would be wearing the  monitor. The patch cannot be worn during these tests. You cannot remove and re-apply the  ZIO XT patch monitor.  Your ZIO patch monitor will be mailed 3 day USPS to your address on  file. It may take 3-5 days  to receive your monitor after you have been enrolled.  Once you have received your monitor, please review the enclosed instructions. Your monitor  has already been registered assigning a specific monitor serial # to you.  Billing and Patient Assistance Program Information  We have supplied [employer] with any of your insurance information on file for billing purposes. [employer] offers a sliding scale Patient Assistance Program for patients that do not have  insurance, or whose insurance does not completely cover the cost of the ZIO monitor.  You must apply for the Patient Assistance Program to qualify for this discounted rate.  To apply, please call [employer] at [phone], select option 4, select option 2, ask to apply for  Patient Assistance Program. Meredeth Ide will ask your household income, and how many people  are in your household. They will quote your out-of-pocket cost based on that information.  [employer] will also be able to set up a 67-month, interest-free payment plan if needed.  Applying the monitor   Shave hair from upper left chest.  Hold abrader disc by orange tab. Rub abrader in 40 strokes over the upper left chest as  indicated in your monitor instructions.  Clean area with 4 enclosed alcohol pads. Let dry.  Apply patch as indicated in monitor instructions. Patch will be placed under collarbone on left  side of chest with arrow pointing upward.  Rub patch adhesive wings for 2 minutes. Remove white label marked "1". Remove the white  label marked "2". Rub patch adhesive wings for 2 additional minutes.  While looking in a mirror, press and release button  in center of patch. A small green light will  flash 3-4 times. This will be your only indicator that the monitor has been turned on.  Do not shower for the first 24 hours. You may shower after the first 24 hours.  Press the button if you feel a symptom. You will hear a small click. Record Date, Time and   Symptom in the Patient Logbook.  When you are ready to remove the patch, follow instructions on the last 2 pages of Patient  Logbook. Stick patch monitor onto the last page of Patient Logbook.  Place Patient Logbook in the blue and white box. Use locking tab on box and tape box closed  securely. The blue and white box has prepaid postage on it. Please place it in the mailbox as  soon as possible. Your physician should have your test results approximately 7 days after the  monitor has been mailed back to [facility].  Call [facility] Customer Care at [phone] if you have questions regarding  your ZIO XT patch monitor. Call them immediately if you see an orange light blinking on your  monitor.  If your monitor falls off in less than 4 days, contact our Monitor department at [phone].  If your monitor becomes loose or falls off after 4 days call [employer] at [phone] for  suggestions on securing your monitor    Follow-Up: At [facility], you and your health needs are our priority.  As part of our continuing mission to provide you with exceptional heart care, we have created designated Provider Care Teams.  These Care Teams include your primary Cardiologist (physician) and Advanced Practice Providers (APPs -  Physician Assistants and Nurse Practitioners) who all work together to provide you with the care you need, when you need it.  Your next appointment:   4 month(s)  Provider:   [name], DO

## 2023-06-02 NOTE — Progress Notes (Unsigned)
Enrolled for [employer] to mail a ZIO XT long term holter monitor to the patients address on file.  

## 2023-06-03 ENCOUNTER — Other Ambulatory Visit: Payer: Self-pay

## 2023-06-03 DIAGNOSIS — M25512 Pain in left shoulder: Secondary | ICD-10-CM | POA: Diagnosis not present

## 2023-06-03 NOTE — Progress Notes (Signed)
Cardiology Office Note:    Date:  06/03/2023   ID:  Thomas Knight, DOB 09-Nov-1945, MRN [id number]  PCP:  Noni Saupe, MD  Cardiologist:  [name], DO  Electrophysiologist:  None   Referring MD: Noni Saupe, MD   " I am doing well"  History of Present Illness:    Thomas Knight is a 77 y.o. male with a hx of minimal coronary artery disease seen on coronary CTA, paroxysmal atrial fibrillation on Eliquis twice daily and metoprolol 25 mg twice a day, he is status post cardioversion and remains in sinus rhythm on his metoprolol.  He had declined use of antiarrhythmic.  History of COVID-19 infection back in March 2021.  Other includes hypertension, hyperlipidemia OSA .   At his last visit in June 2024, he was hypertensive- increased his coreg. I also send the patient for an echo. He echo was normal. Since he has completed chemotherapy and then underwent three doses of radiation in October. He reports a significant improvement in his appetite and has gained back 25-26 pounds of the 48 pounds he had lost during treatment. He feels good and is 'back to himself.'  In June, during his last chemotherapy treatment, he was told he was ' in afib.'  He denies chest pain and has not noticed any irregular heartbeats.  He has not had any nausea, vomiting, or dizziness since starting the medication.   Past Medical History:  Diagnosis Date   Bilateral leg edema 08/24/2019   Cancer (HCC)    skin ca, colon   Chronic cough    Chronic pain of left knee 12/01/2015   COVID-19    06/2019   Dysrhythmia    afib  was cardioverted   Essential hypertension 08/24/2019   Mixed hyperlipidemia 08/24/2019   Obesity (BMI 30-39.9) 08/24/2019   Persistent atrial fibrillation (HCC) 08/24/2019    Past Surgical History:  Procedure Laterality Date   ACHILLES TENDON REPAIR Left 2012   Ruptured   BASAL CELL CARCINOMA EXCISION  01/2023   right nose, back   BRONCHIAL BIOPSY  02/09/2022   Procedure:  BRONCHIAL BIOPSIES;  Surgeon: Josephine Igo, DO;  Location: [facility];  Service: Pulmonary;;   BRONCHIAL NEEDLE ASPIRATION BIOPSY  02/09/2022   Procedure: BRONCHIAL NEEDLE ASPIRATION BIOPSIES;  Surgeon: Josephine Igo, DO;  Location: [facility];  Service: Pulmonary;;   COLONOSCOPY WITH PROPOFOL N/A 12/11/2021   Procedure: COLONOSCOPY WITH PROPOFOL;  Surgeon: Jeani Hawking, MD;  Location: [facility];  Service: Gastroenterology;  Laterality: N/A;   HERNIA REPAIR     umbilical hernia   IR IMAGING GUIDED PORT INSERTION  02/18/2022   SKIN SURGERY Left    Basal cell carcinoma on left forearm   XI ROBOTIC ASSISTED LOWER ANTERIOR RESECTION N/A 01/09/2021   Procedure: XI ROBOTIC ASSISTED RIGHT HEMI COLECTOMY, LYSIS OF ADHESIONS, SMALL BOWEL RESECTION, INTRAOPERATIVE ASSESMENT OF PERFUSION, PARTIAL OMENTECTOMY;  Surgeon: Andria Meuse, MD;  Location: [facility];  Service: General;  Laterality: N/A;    Current Medications: Current Meds  Medication Sig   acetaminophen (TYLENOL) 500 MG tablet Take 500-1,000 mg by mouth every 6 (six) hours as needed (pain.).   Ascorbic Acid (VITAMIN C WITH ROSE HIPS) 500 MG tablet Take 500 mg by mouth every evening.   carvedilol (COREG) 12.5 MG tablet Take 1 tablet (12.5 mg total) by mouth 2 (two) times daily.   Cholecalciferol (D3-1000) 25 MCG (1000 UT) capsule Take 1,000 Units by mouth every evening.  diphenoxylate-atropine (LOMOTIL) 2.5-0.025 MG tablet Take 1 tablet by mouth 4 (four) times daily as needed for diarrhea or loose stools.   ELIQUIS 5 MG TABS tablet Take 1 tablet (5 mg total) by mouth 2 (two) times daily.   Misc Natural Products (JOINT SUPPORT PO) Take 1 tablet by mouth daily. Instaflex Advanced Joint Support   potassium chloride SA (KLOR-CON M) 20 MEQ tablet Take 1 tablet (20 mEq total) by mouth daily.   sacubitril-valsartan (ENTRESTO) 24-26 MG Take 1 tablet by mouth 2 (two) times daily.   vitamin B-12 (CYANOCOBALAMIN) 1000 MCG tablet Take  1,000 mcg by mouth every evening.   Zinc 50 MG CAPS Take 50 mg by mouth every evening.   [DISCONTINUED] carvedilol (COREG) 6.25 MG tablet Take 1 tablet (6.25 mg total) by mouth 2 (two) times daily.   [DISCONTINUED] nitroGLYCERIN (NITROSTAT) 0.4 MG SL tablet Place 1 tablet (0.4 mg total) under the tongue every 5 (five) minutes as needed.     Allergies:   Furosemide and Tramadol   Social History   Socioeconomic History   Marital status: Married    Spouse name: Not on file   Number of children: Not on file   Years of education: Not on file   Highest education level: Not on file  Occupational History   Not on file  Tobacco Use   Smoking status: Never   Smokeless tobacco: Never  Vaping Use   Vaping status: Never Used  Substance and Sexual Activity   Alcohol use: Never   Drug use: Never   Sexual activity: Not Currently  Other Topics Concern   Not on file  Social History Narrative   Not on file   Social Determinants of Health   Financial Resource Strain: Not on file  Food Insecurity: No Food Insecurity (03/15/2023)   Hunger Vital Sign    Worried About Running Out of Food in the Last Year: Never true    Ran Out of Food in the Last Year: Never true  Transportation Needs: No Transportation Needs (03/15/2023)   PRAPARE - Administrator, Civil Service (Medical): No    Lack of Transportation (Non-Medical): No  Physical Activity: Not on file  Stress: Not on file  Social Connections: Not on file     Family History: The patient's family history includes Lung cancer in his father.  ROS:   Review of Systems  Constitution: Negative for decreased appetite, fever and weight gain.  HENT: Negative for congestion, ear discharge, hoarse voice and sore throat.   Eyes: Negative for discharge, redness, vision loss in right eye and visual halos.  Cardiovascular: Negative for chest pain, dyspnea on exertion, leg swelling, orthopnea and palpitations.  Respiratory: Negative for  cough, hemoptysis, shortness of breath and snoring.   Endocrine: Negative for heat intolerance and polyphagia.  Hematologic/Lymphatic: Negative for bleeding problem. Does not bruise/bleed easily.  Skin: Negative for flushing, nail changes, rash and suspicious lesions.  Musculoskeletal: Negative for arthritis, joint pain, muscle cramps, myalgias, neck pain and stiffness.  Gastrointestinal: Negative for abdominal pain, bowel incontinence, diarrhea and excessive appetite.  Genitourinary: Negative for decreased libido, genital sores and incomplete emptying.  Neurological: Negative for brief paralysis, focal weakness, headaches and loss of balance.  Psychiatric/Behavioral: Negative for altered mental status, depression and suicidal ideas.  Allergic/Immunologic: Negative for HIV exposure and persistent infections.    EKGs/Labs/Other Studies Reviewed:    The following studies were reviewed today:   EKG: None today   Recent Labs: 05/30/2023: ALT  11; BUN 14; Creatinine, Ser 0.87; Hemoglobin 14.9; Platelets 139; Potassium 3.9; Sodium 141  Recent Lipid Panel No results found for: "CHOL", "TRIG", "HDL", "CHOLHDL", "VLDL", "LDLCALC", "LDLDIRECT"  Physical Exam:    VS:  BP (!) 160/88 (BP Location: Right Arm, Patient Position: Sitting, Cuff Size: Normal)   Pulse (!) 50   Ht 5\' 11"  (1.803 m)   Wt 231 lb 9.6 oz (105.1 kg)   SpO2 97%   BMI 32.30 kg/m     Wt Readings from Last 3 Encounters:  06/02/23 231 lb 9.6 oz (105.1 kg)  04/07/23 215 lb 8 oz (97.8 kg)  03/15/23 221 lb 3.2 oz (100.3 kg)     GEN: Well nourished, well developed in no acute distress HEENT: Normal NECK: No JVD; No carotid bruits LYMPHATICS: No lymphadenopathy CARDIAC: S1S2 noted,RRR, no murmurs, rubs, gallops RESPIRATORY:  Clear to auscultation without rales, wheezing or rhonchi  ABDOMEN: Soft, non-tender, non-distended, +bowel sounds, no guarding. EXTREMITIES: No edema, No cyanosis, no clubbing MUSCULOSKELETAL:  No  deformity  SKIN: Warm and dry NEUROLOGIC:  Alert and oriented x 3, non-focal PSYCHIATRIC:  Normal affect, good insight  ASSESSMENT:    1. Depressed left ventricular ejection fraction   2. PAF (paroxysmal atrial fibrillation) (HCC)   3. Essential hypertension   4. Minimal CAD   5. Chronic diastolic heart failure (HCC)   6. Obstructive sleep apnea syndrome     PLAN:    I am concerned about the report of intermittent afib. Currently on Eliquis. No symptoms of chest pain reported. -Order a 2-week heart monitor to assess frequency and duration of atrial fibrillation episodes. -Increase Carvedilol dose due to high blood pressure and potential benefit for atrial fibrillation.  NICM - with recent chemotherapy will repeat an echo.    HTN - bp elevated will increase coreg to 12.5 mg BID.  Continue use of CPAP  The patient is in agreement with the above plan. The patient left the office in stable condition.  The patient will follow up in4  months or sooner if needed.   Medication Adjustments/Labs and Tests Ordered: Current medicines are reviewed at length with the patient today.  Concerns regarding medicines are outlined above.  Orders Placed This Encounter  Procedures   LONG TERM MONITOR (3-14 DAYS)   ECHOCARDIOGRAM COMPLETE    Meds ordered this encounter  Medications   carvedilol (COREG) 12.5 MG tablet    Sig: Take 1 tablet (12.5 mg total) by mouth 2 (two) times daily.    Dispense:  180 tablet    Refill:  3   nitroGLYCERIN (NITROSTAT) 0.4 MG SL tablet    Sig: Place 1 tablet (0.4 mg total) under the tongue every 5 (five) minutes as needed.    Dispense:  25 tablet    Refill:  5     Patient Instructions  Medication Instructions:  Your physician has recommended you make the following change in your medication:  INCREASE: Coreg (Carvedilol)  12.5 mg twice daily *If you need a refill on your cardiac medications before your next appointment, please call your  pharmacy*   Testing/Procedures: Your physician has requested that you have an echocardiogram - [place]. Echocardiography is a painless test that uses sound waves to create images of your heart. It provides your doctor with information about the size and shape of your heart and how well your heart's chambers and valves are working. This procedure takes approximately one hour. There are no restrictions for this procedure. Please do NOT wear cologne,  perfume, aftershave, or lotions (deodorant is allowed). Please arrive 15 minutes prior to your appointment time.  Please note: We ask at that you not bring children with you during ultrasound (echo/ vascular) testing. Due to room size and safety concerns, children are not allowed in the ultrasound rooms during exams. Our front office staff cannot provide observation of children in our lobby area while testing is being conducted. An adult accompanying a patient to their appointment will only be allowed in the ultrasound room at the discretion of the ultrasound technician under special circumstances. We apologize for any inconvenience.  ZIO XT- Long Term Monitor Instructions  Your physician has requested you wear a ZIO patch monitor for 14 days.  This is a single patch monitor. [employer] supplies one patch monitor per enrollment. Additional stickers are not available. Please do not apply patch if you will be having a Nuclear Stress Test,  Echocardiogram, Cardiac CT, MRI, or Chest Xray during the period you would be wearing the  monitor. The patch cannot be worn during these tests. You cannot remove and re-apply the  ZIO XT patch monitor.  Your ZIO patch monitor will be mailed 3 day USPS to your address on file. It may take 3-5 days  to receive your monitor after you have been enrolled.  Once you have received your monitor, please review the enclosed instructions. Your monitor  has already been registered assigning a specific monitor serial # to  you.  Billing and Patient Assistance Program Information  We have supplied [employer] with any of your insurance information on file for billing purposes. [employer] offers a sliding scale Patient Assistance Program for patients that do not have  insurance, or whose insurance does not completely cover the cost of the ZIO monitor.  You must apply for the Patient Assistance Program to qualify for this discounted rate.  To apply, please call [employer] at [phone], select option 4, select option 2, ask to apply for  Patient Assistance Program. Meredeth Ide will ask your household income, and how many people  are in your household. They will quote your out-of-pocket cost based on that information.  [employer] will also be able to set up a 50-month, interest-free payment plan if needed.  Applying the monitor   Shave hair from upper left chest.  Hold abrader disc by orange tab. Rub abrader in 40 strokes over the upper left chest as  indicated in your monitor instructions.  Clean area with 4 enclosed alcohol pads. Let dry.  Apply patch as indicated in monitor instructions. Patch will be placed under collarbone on left  side of chest with arrow pointing upward.  Rub patch adhesive wings for 2 minutes. Remove white label marked "1". Remove the white  label marked "2". Rub patch adhesive wings for 2 additional minutes.  While looking in a mirror, press and release button in center of patch. A small green light will  flash 3-4 times. This will be your only indicator that the monitor has been turned on.  Do not shower for the first 24 hours. You may shower after the first 24 hours.  Press the button if you feel a symptom. You will hear a small click. Record Date, Time and  Symptom in the Patient Logbook.  When you are ready to remove the patch, follow instructions on the last 2 pages of Patient  Logbook. Stick patch monitor onto the last page of Patient Logbook.  Place Patient Logbook in the blue and white box.  Use locking tab  on box and tape box closed  securely. The blue and white box has prepaid postage on it. Please place it in the mailbox as  soon as possible. Your physician should have your test results approximately 7 days after the  monitor has been mailed back to [facility].  Call [facility] Customer Care at [phone] if you have questions regarding  your ZIO XT patch monitor. Call them immediately if you see an orange light blinking on your  monitor.  If your monitor falls off in less than 4 days, contact our Monitor department at [phone].  If your monitor becomes loose or falls off after 4 days call [employer] at [phone] for  suggestions on securing your monitor    Follow-Up: At [facility], you and your health needs are our priority.  As part of our continuing mission to provide you with exceptional heart care, we have created designated Provider Care Teams.  These Care Teams include your primary Cardiologist (physician) and Advanced Practice Providers (APPs -  Physician Assistants and Nurse Practitioners) who all work together to provide you with the care you need, when you need it.  Your next appointment:   4 month(s)  Provider:   [name], DO    Adopting a Healthy Lifestyle.  Know what a healthy weight is for you (roughly BMI <25) and aim to maintain this   Aim for 7+ servings of fruits and vegetables daily   65-80+ fluid ounces of water or unsweet tea for healthy kidneys   Limit to max 1 drink of alcohol per day; avoid smoking/tobacco   Limit animal fats in diet for cholesterol and heart health - choose grass fed whenever available   Avoid highly processed foods, and foods high in saturated/trans fats   Aim for low stress - take time to unwind and care for your mental health   Aim for 150 min of moderate intensity exercise weekly for heart health, and weights twice weekly for bone health   Aim for 7-9 hours of sleep daily   When it  comes to diets, agreement about the perfect plan isnt easy to find, even among the experts. Experts at the [facility] of [employer] developed an idea known as the Healthy Eating Plate. Just imagine a plate divided into logical, healthy portions.   The emphasis is on diet quality:   Load up on vegetables and fruits - one-half of your plate: Aim for color and variety, and remember that potatoes dont count.   Go for whole grains - one-quarter of your plate: Whole wheat, barley, wheat berries, quinoa, oats, brown rice, and foods made with them. If you want pasta, go with whole wheat pasta.   Protein power - one-quarter of your plate: Fish, chicken, beans, and nuts are all healthy, versatile protein sources. Limit red meat.   The diet, however, does go beyond the plate, offering a few other suggestions.   Use healthy plant oils, such as olive, canola, soy, corn, sunflower and peanut. Check the labels, and avoid partially hydrogenated oil, which have unhealthy trans fats.   If youre thirsty, drink water. Coffee and tea are good in moderation, but skip sugary drinks and limit milk and dairy products to one or two daily servings.   The type of carbohydrate in the diet is more important than the amount. Some sources of carbohydrates, such as vegetables, fruits, whole grains, and beans-are healthier than others.   Finally, stay active  Signed, [name], DO  06/03/2023  8:18 PM    [facility]

## 2023-06-05 NOTE — Assessment & Plan Note (Signed)
-  stage II (pT3, N0), metastatic disease to lung and possible nodes in July 2023, MSS, KRAS/NRAS/BRAF wild type   -found on screening colonoscopy. S/p right hemicolectomy 01/09/21. -lung metastasis confirmed in [facility] by bronchoscopy on 02/09/22. -[facility] showed MSS, low tumor burden, KRAS/NRAS/BRAF wild-type, no other targetable mutations. The benefit of EGFR inhibitor is much less in right-sided colon cancer, so I will not give it as first line but may consider in subsequent lines.  -he started FOLFOX on 03/03/22. He tolerated well overall with diarrhea for several days. Bevacizumab added with C2 (9/20).  -CT scan 05/26/2022 showed improved lung mets, but showed new infiltrative lung change, possible related to his recent URI -repeated CT chest on 07/26/2022 showed stable known lung mets (overall excellent response, with minimum residual dsease) and resolved previously see inflammatory change in RLL -I have changed treatment to maintenance xeloda and beva every 3 weeks, he started C1 on 08/18/2022.  -due to recurrent diarrhea, I stopped Xeloda after 2 cycles -we have changed his treatment to 5-fu pump infusion and beva every 2 weeks on 4/24, he tolerated reasonably well, diarrhea has resolved now. -Restaging CT scan yesterday showed stable appearance of previously scattered small pulmonary nodules, measuring 4-75mm, no new lesions.  Overall has much improved compared to his initial CT scan from July 2023, and it is a hard to tell if this is residual disease versus scar tissue.  -We stopped his chemo after last scan and he is on cancer surveillance now. -Personally reviewed his follow-up CT scan from February 14, 2023, which showed slightly increase the size of the 2 nodule in the right lung, most consistent with disease progression.  No other new metastasis. -He completed SBRT on 04/11/2023

## 2023-06-06 ENCOUNTER — Inpatient Hospital Stay (HOSPITAL_BASED_OUTPATIENT_CLINIC_OR_DEPARTMENT_OTHER): Payer: Medicare HMO | Admitting: Hematology

## 2023-06-06 ENCOUNTER — Encounter: Payer: Self-pay | Admitting: Hematology

## 2023-06-06 VITALS — BP 171/77 | HR 60 | Temp 98.4°F | Resp 16 | Wt 234.4 lb

## 2023-06-06 DIAGNOSIS — Z452 Encounter for adjustment and management of vascular access device: Secondary | ICD-10-CM | POA: Diagnosis not present

## 2023-06-06 DIAGNOSIS — I700 Atherosclerosis of aorta: Secondary | ICD-10-CM | POA: Diagnosis not present

## 2023-06-06 DIAGNOSIS — C7800 Secondary malignant neoplasm of unspecified lung: Secondary | ICD-10-CM | POA: Diagnosis not present

## 2023-06-06 DIAGNOSIS — J700 Acute pulmonary manifestations due to radiation: Secondary | ICD-10-CM | POA: Diagnosis not present

## 2023-06-06 DIAGNOSIS — Z923 Personal history of irradiation: Secondary | ICD-10-CM | POA: Diagnosis not present

## 2023-06-06 DIAGNOSIS — C182 Malignant neoplasm of ascending colon: Secondary | ICD-10-CM

## 2023-06-06 NOTE — Progress Notes (Signed)
[facility]   Telephone:([phone]) [phone] Fax:(336) [phone]   Clinic Follow up Note   Patient Care Team: Noni Saupe, MD as PCP - General (Family Medicine) [name], DO as PCP - Cardiology (Cardiology) Malachy Mood, MD as Attending Physician (Hematology and Oncology)  Date of Service:  06/06/2023  CHIEF COMPLAINT: f/u of right colon cancer  CURRENT THERAPY:  Cancer surveillance  Oncology History   Cancer of right colon [facility]) -stage II (pT3, N0), metastatic disease to lung and possible nodes in July 2023, MSS, KRAS/NRAS/BRAF wild type   -found on screening colonoscopy. S/p right hemicolectomy 01/09/21. -lung metastasis confirmed in RUL by bronchoscopy on 02/09/22. -[employer] showed MSS, low tumor burden, KRAS/NRAS/BRAF wild-type, no other targetable mutations. The benefit of EGFR inhibitor is much less in right-sided colon cancer, so I will not give it as first line but may consider in subsequent lines.  -he started FOLFOX on 03/03/22. He tolerated well overall with diarrhea for several days. Bevacizumab added with C2 (9/20).  -CT scan 05/26/2022 showed improved lung mets, but showed new infiltrative lung change, possible related to his recent URI -repeated CT chest on 07/26/2022 showed stable known lung mets (overall excellent response, with minimum residual dsease) and resolved previously see inflammatory change in RLL -I have changed treatment to maintenance xeloda and beva every 3 weeks, he started C1 on 08/18/2022.  -due to recurrent diarrhea, I stopped Xeloda after 2 cycles -we have changed his treatment to 5-fu pump infusion and beva every 2 weeks on 4/24, he tolerated reasonably well, diarrhea has resolved now. -Restaging CT scan yesterday showed stable appearance of previously scattered small pulmonary nodules, measuring 4-75mm, no new lesions.  Overall has much improved compared to his initial CT scan from July 2023, and it is a hard to tell if this is  residual disease versus scar tissue.  -We stopped his chemo after last scan and he is on cancer surveillance now. -Personally reviewed his follow-up CT scan from February 14, 2023, which showed slightly increase the size of the 2 nodule in the right lung, most consistent with disease progression.  No other new metastasis. -He completed SBRT on 04/11/2023    Assessment and Plan    Metastatic Colon Cancer Reports feeling the best since he completed RTtreatments, with significant weight gain and improved appetite. Recent CT scan shows no new growths or nodules, but radiation-induced inflammation noted. Slight increase in nodule size likely possibly due to inflammation, but cancer progression is also a possibility . Plan to follow up with a PET scan in February to differentiate between residual cancer and inflammation. Discussed potential outcomes and next steps, including possible surgery, chemotherapy, or no treatment based on PET scan results. Explained that PET scan is more detailed and covers the whole body. If PET scan is inconclusive, consider circulating tumor DNA test. Discussed the possibility of surgery if only one small part is concerning, and the potential for different types of chemotherapy if needed. Emphasized the importance of maintaining a high protein, high carbohydrate diet, staying active, and using a spirometer for breathing exercises. - Order PET scan in early February - Ensure insurance approval for PET scan - Schedule follow-up visit one week after PET scan - Discuss results with Dr. Mitzi Hansen - Consider circulating tumor DNA test if PET scan is inconclusive - Maintain high protein, high carbohydrate diet - Encourage staying active - Use spirometer for breathing exercises - Schedule lab and port flush  Radiation-Induced Pneumonitis Radiation-induced inflammation in  the right lung observed on CT scan. Contributing to slight increase in nodule size. Plan to monitor with follow-up  PET scan. - Monitor with PET scan in early February - Discuss findings with Dr. Mitzi Hansen  General Health Maintenance Active and maintains a healthy lifestyle. - Encourage maintaining current activity levels - Continue high protein, high carbohydrate diet  Follow-up -Lab and restaging CT scan reviewed, will let Dr. Mitzi Hansen review also.  -Continue cancer surveillance - schedule PET scan in early February - Schedule follow-up visit one week after PET scan - Schedule lab and port flush         SUMMARY OF ONCOLOGIC HISTORY: Oncology History Overview Note   Cancer Staging  Cancer of right colon [facility]) Staging form: Colon and Rectum, AJCC 8th Edition - Pathologic stage from 01/09/2021: Stage IIA (pT3, pN0, cM0) - Signed by Malachy Mood, MD on 02/12/2022 Total positive nodes: 0 Histologic grading system: 4 grade system Histologic grade (G): G2 Residual tumor (R): R0 - None     Cancer of right colon (HCC)  10/2020 Procedure   Colonoscopy-Dr. Elnoria Howard     11/06/2020 Imaging   CT CHEST ABDOMEN PELVIS W CONTRAST   IMPRESSION: 1. 4.6 cm partially circumferential apple-core type neoplastic process involving the mid transverse colon. No findings for locoregional adenopathy or distant metastatic disease. 2. Enlarged prostate gland with median lobe hypertrophy impressing on the base of the bladder. 3. 16 mm left thyroid nodule. Recommend follow-up thyroid ultrasound examination.   01/09/2021 Initial Diagnosis   Colon cancer (HCC)   01/09/2021 Pathology Results   B. COLON, RIGHT, RESECTION:  -  Adenocarcinoma, moderately differentiated, 3.0 cm  -  No carcinoma identified in seventeen lymph nodes (0/17)  -  Margins uninvolved by carcinoma  -  Tubular adenoma (x2)  -  Benign appendix  -  See oncology table and comment below   Margin Status for Invasive Carcinoma: All margins negative for  invasive carcinoma       Distance from Invasive Carcinoma to Radial (Circumferential)       Margin  Status for Non-Invasive Tumor: All margins negative for  high-grade dysplasia / intramucosal carcinoma and low-grade dysplasia  Regional Lymph Nodes:       Number of Lymph Nodes with Tumor: 0       Number of Lymph Nodes Examined: 17  Tumor Deposits: 0  Distant Metastasis:       Distant Site(s) Involved: Not applicable  Pathologic Stage Classification (pTNM, AJCC 8th Edition): pT3, pN0   MMR Stable    01/09/2021 Cancer Staging   Staging form: Colon and Rectum, AJCC 8th Edition - Pathologic stage from 01/09/2021: Stage IIA (pT3, pN0, cM0) - Signed by Malachy Mood, MD on 02/12/2022 Total positive nodes: 0 Histologic grading system: 4 grade system Histologic grade (G): G2 Residual tumor (R): R0 - None   05/2021 Tumor Marker   Patient's tumor was tested for the following markers: CEA. Results of the tumor marker test revealed 2.1.   12/11/2021 Procedure   Colonoscopy-Dr. Elnoria Howard  There was evidence of a prior functional end-to-end ileo-colonic anastomosis in the transverse colon. This was patent and was characterized by healthy appearing mucosa. The anastomosis was traversed. Findings: Scattered small and large-mouthed diverticula were found in the sigmoid colon. - Patent functional end-to-end ileo-colonic anastomosis, characterized by healthy appearing mucosa. - Diverticulosis in the sigmoid colon. - No specimens collected.   01/25/2022 Imaging   CT CHEST ABDOMEN PELVIS W CONTRAST   IMPRESSION: 1. New right-sided pulmonary  nodules measure up to 11 mm, nonspecific but concerning for metastatic disease. Consider further evaluation with nuclear medicine PET/CT. 2. New nodularity/lymph nodes in the central mesentery, concerning for nodal disease involvement. 3. Slightly increased size of prominent retroperitoneal lymph nodes, nonspecific but also somewhat concerning for nodal disease involvement. 4. Surgical change of partial colectomy without evidence of local recurrence. 5. Stable  prominent mediastinal lymph nodes, nonspecific but stability is reassuring. Attention on follow-up imaging suggested. 6. Stable prominent right external iliac lymph node, nonspecific but stability is reassuring. Attention on follow-up imaging suggested. 7. Stable hypodense 8 mm hepatic lesion technically too small to accurately characterize but stability is reassuring. Attention on follow-up imaging suggested. 8. 1.9 cm incidental left thyroid nodule. Recommend thyroid US. Reference: J Am Coll Radiol. 2015 Feb;12(2): 143-50 9.  Aortic Atherosclerosis (ICD10-I70.0).   02/09/2022 Procedure   Bronchoscopy under the care of Dr. Tonia Brooms    02/09/2022 Pathology Results   FINAL MICROSCOPIC DIAGNOSIS:   A. LUNG, RUL, FINE NEEDLE ASPIRATION  BIOPSY FORCEPS:  Adenocarcinoma with morphologic features consistent with metastasis from a colorectal primary.  Please see comment.   Comment: The following immunostains are performed with appropriate controls :  TTF-1: Negative .  Napsin A: Negative .  CK7: Negative.  CK20: Positive.  CDX2: Positive .   The above immuno histochemical profile is supportive of the rendered diagnosis.    08/18/2022 - 08/18/2022 Chemotherapy   Patient is on Treatment Plan : COLORECTAL Bevacizumab q14d     Colon cancer metastasized to lung [facility])  02/09/2022 Pathology Results   FINAL MICROSCOPIC DIAGNOSIS:   A. LUNG, RUL, FINE NEEDLE ASPIRATION  BIOPSY FORCEPS:  Adenocarcinoma with morphologic features consistent with metastasis from a colorectal primary.  Please see comment.   Comment: The following immunostains are performed with appropriate controls :  TTF-1: Negative .  Napsin A: Negative .  CK7: Negative.  CK20: Positive.  CDX2: Positive .   The above immuno histochemical profile is supportive of the rendered diagnosis.    02/12/2022 Initial Diagnosis   Colon cancer metastasized to lung (HCC)   03/03/2022 - 07/31/2022 Chemotherapy   Patient is on Treatment Plan  : COLORECTAL FOLFOX + Bevacizumab q14d     07/26/2022 Imaging    IMPRESSION: 1. Near-complete interval resolution of nodular and masslike areas of irregular consolidation, likely resolving infection or organizing pneumonia/drug reaction. 2. Additional previously seen pulmonary nodules are stable, consistent with treated pulmonary metastatic disease. 3. Mild peribronchovascular reticular opacities which are most pronounced in the mid and lower lungs, similar to prior exam, progressed when compared with prior dated January 15, 2022. Findings can be seen in the setting of interstitial lung disease, pattern is most consistent with fibrotic NSIP. 4. Aortic Atherosclerosis (ICD10-I70.0).   07/26/2022 Imaging    IMPRESSION: 1. Near-complete interval resolution of nodular and masslike areas of irregular consolidation, likely resolving infection or organizing pneumonia/drug reaction. 2. Additional previously seen pulmonary nodules are stable, consistent with treated pulmonary metastatic disease. 3. Mild peribronchovascular reticular opacities which are most pronounced in the mid and lower lungs, similar to prior exam, progressed when compared with prior dated January 15, 2022. Findings can be seen in the setting of interstitial lung disease, pattern is most consistent with fibrotic NSIP. 4. Aortic Atherosclerosis (ICD10-I70.0).   09/08/2022 - 09/08/2022 Chemotherapy   Patient is on Treatment Plan : COLORECTAL Bevacizumab q21d     10/20/2022 -  Chemotherapy   Patient is on Treatment Plan : COLORECTAL 5FU + Leucovorin (Modified  [place]) + Bevacizumab q14d        Discussed the use of AI scribe software for clinical note transcription with the patient, who gave verbal consent to proceed.  History of Present Illness   A 77 year old patient with a history of metastatic colon cancer presents for a follow-up visit. The patient reports feeling well and has regained more than half of the weight lost  during treatment. He reports no breathing issues, only occasional coughing, which he attributes to a mild cold. He has been active, participating in physical therapy for a rotator cuff issue and maintaining a good appetite. The patient completed radiation therapy two months ago, receiving three doses instead of the initially planned five.         All other systems were reviewed with the patient and are negative.  MEDICAL HISTORY:  Past Medical History:  Diagnosis Date   Bilateral leg edema 08/24/2019   Cancer (HCC)    skin ca, colon   Chronic cough    Chronic pain of left knee 12/01/2015   COVID-19    06/2019   Dysrhythmia    afib  was cardioverted   Essential hypertension 08/24/2019   Mixed hyperlipidemia 08/24/2019   Obesity (BMI 30-39.9) 08/24/2019   Persistent atrial fibrillation (HCC) 08/24/2019    SURGICAL HISTORY: Past Surgical History:  Procedure Laterality Date   ACHILLES TENDON REPAIR Left 2012   Ruptured   BASAL CELL CARCINOMA EXCISION  01/2023   right nose, back   BRONCHIAL BIOPSY  02/09/2022   Procedure: BRONCHIAL BIOPSIES;  Surgeon: Josephine Igo, DO;  Location: [facility];  Service: Pulmonary;;   BRONCHIAL NEEDLE ASPIRATION BIOPSY  02/09/2022   Procedure: BRONCHIAL NEEDLE ASPIRATION BIOPSIES;  Surgeon: Josephine Igo, DO;  Location: [facility];  Service: Pulmonary;;   COLONOSCOPY WITH PROPOFOL N/A 12/11/2021   Procedure: COLONOSCOPY WITH PROPOFOL;  Surgeon: Jeani Hawking, MD;  Location: [facility];  Service: Gastroenterology;  Laterality: N/A;   HERNIA REPAIR     umbilical hernia   IR IMAGING GUIDED PORT INSERTION  02/18/2022   SKIN SURGERY Left    Basal cell carcinoma on left forearm   XI ROBOTIC ASSISTED LOWER ANTERIOR RESECTION N/A 01/09/2021   Procedure: XI ROBOTIC ASSISTED RIGHT HEMI COLECTOMY, LYSIS OF ADHESIONS, SMALL BOWEL RESECTION, INTRAOPERATIVE ASSESMENT OF PERFUSION, PARTIAL OMENTECTOMY;  Surgeon: Andria Meuse, MD;  Location:  [facility];  Service: General;  Laterality: N/A;    I have reviewed the social history and family history with the patient and they are unchanged from previous note.  ALLERGIES:  is allergic to furosemide and tramadol.  MEDICATIONS:  Current Outpatient Medications  Medication Sig Dispense Refill   acetaminophen (TYLENOL) 500 MG tablet Take 500-1,000 mg by mouth every 6 (six) hours as needed (pain.).     Ascorbic Acid (VITAMIN C WITH ROSE HIPS) 500 MG tablet Take 500 mg by mouth every evening.     carvedilol (COREG) 12.5 MG tablet Take 1 tablet (12.5 mg total) by mouth 2 (two) times daily. 180 tablet 3   Cholecalciferol (D3-1000) 25 MCG (1000 UT) capsule Take 1,000 Units by mouth every evening.     diphenoxylate-atropine (LOMOTIL) 2.5-0.025 MG tablet Take 1 tablet by mouth 4 (four) times daily as needed for diarrhea or loose stools. 30 tablet 2   ELIQUIS 5 MG TABS tablet Take 1 tablet (5 mg total) by mouth 2 (two) times daily. 180 tablet 1   Misc Natural Products (JOINT SUPPORT PO) Take 1 tablet by mouth  daily. Instaflex Advanced Joint Support     nitroGLYCERIN (NITROSTAT) 0.4 MG SL tablet Place 1 tablet (0.4 mg total) under the tongue every 5 (five) minutes as needed. 25 tablet 5   potassium chloride SA (KLOR-CON M) 20 MEQ tablet Take 1 tablet (20 mEq total) by mouth daily. 30 tablet 1   sacubitril-valsartan (ENTRESTO) 24-26 MG Take 1 tablet by mouth 2 (two) times daily. 180 tablet 3   vitamin B-12 (CYANOCOBALAMIN) 1000 MCG tablet Take 1,000 mcg by mouth every evening.     Zinc 50 MG CAPS Take 50 mg by mouth every evening.     No current facility-administered medications for this visit.   Facility-Administered Medications Ordered in Other Visits  Medication Dose Route Frequency Provider Last Rate Last Admin   sodium chloride flush (NS) 0.9 % injection 10 mL  10 mL Intracatheter PRN Malachy Mood, MD   10 mL at 11/19/22 0910    PHYSICAL EXAMINATION: ECOG PERFORMANCE STATUS: 0 -  Asymptomatic  Vitals:   06/06/23 1021  BP: (!) 171/77  Pulse: 60  Resp: 16  Temp: 98.4 F (36.9 C)  SpO2: 98%   Wt Readings from Last 3 Encounters:  06/06/23 234 lb 6.4 oz (106.3 kg)  06/02/23 231 lb 9.6 oz (105.1 kg)  04/07/23 215 lb 8 oz (97.8 kg)     GENERAL:alert, no distress and comfortable SKIN: skin color, texture, turgor are normal, no rashes or significant lesions EYES: normal, Conjunctiva are pink and non-injected, sclera clear NECK: supple, thyroid normal size, non-tender, without nodularity LYMPH:  no palpable lymphadenopathy in the cervical, axillary  LUNGS: clear to auscultation and percussion with normal breathing effort HEART: regular rate & rhythm and no murmurs and no lower extremity edema ABDOMEN:abdomen soft, non-tender and normal bowel sounds Musculoskeletal:no cyanosis of digits and no clubbing  NEURO: alert & oriented x 3 with fluent speech, no focal motor/sensory deficits    LABORATORY DATA:  I have reviewed the data as listed    Latest Ref Rng & Units 05/30/2023   10:01 AM 02/14/2023    3:32 PM 12/23/2022   11:58 AM  CBC  WBC 4.0 - 10.5 K/uL 5.1  5.8  5.2   Hemoglobin 13.0 - 17.0 g/dL 09.8  11.9  14.7   Hematocrit 39.0 - 52.0 % 44.5  44.9  42.5   Platelets 150 - 400 K/uL 139  145  128         Latest Ref Rng & Units 05/30/2023   10:01 AM 02/14/2023    3:32 PM 12/23/2022   11:58 AM  CMP  Glucose 70 - 99 mg/dL 829  85  95   BUN 8 - 23 mg/dL 14  13  11    Creatinine 0.61 - 1.24 mg/dL 5.62  1.30  8.65   Sodium 135 - 145 mmol/L 141  141  141   Potassium 3.5 - 5.1 mmol/L 3.9  3.8  3.7   Chloride 98 - 111 mmol/L 107  107  107   CO2 22 - 32 mmol/L 29  28  28    Calcium 8.9 - 10.3 mg/dL 8.6  8.6  8.6   Total Protein 6.5 - 8.1 g/dL 6.3  6.7  6.5   Total Bilirubin <1.2 mg/dL 0.6  0.5  0.7   Alkaline Phos 38 - 126 U/L 55  50  47   AST 15 - 41 U/L 13  16  15    ALT 0 - 44 U/L 11  13  10  RADIOGRAPHIC STUDIES: I have personally reviewed the  radiological images as listed and agreed with the findings in the report. No results found.    Orders Placed This Encounter  Procedures   NM PET Image Restag (PS) Skull Base To Thigh    Standing Status:   Future    Standing Expiration Date:   06/05/2024    Order Specific Question:   If indicated for the ordered procedure, I authorize the administration of a radiopharmaceutical per Radiology protocol    Answer:   Yes    Order Specific Question:   Preferred imaging location?    Answer:   [facility]   Patient and his family had multiple questions.  All questions were answered. The patient knows to call the clinic with any problems, questions or concerns. No barriers to learning was detected. The total time spent in the appointment was 40 minutes.     Malachy Mood, MD 06/06/2023

## 2023-06-07 ENCOUNTER — Other Ambulatory Visit: Payer: Self-pay

## 2023-06-07 ENCOUNTER — Ambulatory Visit
Admission: RE | Admit: 2023-06-07 | Discharge: 2023-06-07 | Disposition: A | Discharge status: Home / Self Care | Payer: Medicare HMO | Source: Ambulatory Visit | Attending: Radiation Oncology | Admitting: Radiation Oncology

## 2023-06-07 DIAGNOSIS — M25512 Pain in left shoulder: Secondary | ICD-10-CM | POA: Diagnosis not present

## 2023-06-07 NOTE — Progress Notes (Signed)
  Radiation Oncology         [phone] ________________________________  Name: Thomas Knight MRN: [id number]  Date of Service: 06/07/2023  DOB: 05-22-1946  Post Treatment Telephone Note  Diagnosis:  C78.01 Secondary malignant neoplasm of right lung (as documented in provider EOT note)  The patient was available for call today.   Symptoms of fatigue have improved since completing therapy.  Symptoms of skin changes have improved since completing therapy.  Symptoms of esophagitis have improved since completing therapy.  The patient has scheduled follow up with his medical oncologist Dr. Mosetta Putt for ongoing care, and was encouraged to call if he  develops concerns or questions regarding radiation.   This concludes the interaction.  Ruel Favors, LPN

## 2023-06-09 DIAGNOSIS — I480 Paroxysmal atrial fibrillation: Secondary | ICD-10-CM | POA: Diagnosis not present

## 2023-06-27 ENCOUNTER — Encounter: Payer: Self-pay | Admitting: Hematology

## 2023-06-30 ENCOUNTER — Encounter: Payer: Self-pay | Admitting: Hematology

## 2023-06-30 ENCOUNTER — Other Ambulatory Visit: Payer: Self-pay

## 2023-06-30 ENCOUNTER — Ambulatory Visit: Payer: Medicare HMO | Attending: Cardiology

## 2023-06-30 ENCOUNTER — Telehealth: Payer: Self-pay | Admitting: Cardiology

## 2023-06-30 DIAGNOSIS — I361 Nonrheumatic tricuspid (valve) insufficiency: Secondary | ICD-10-CM | POA: Diagnosis not present

## 2023-06-30 DIAGNOSIS — I77810 Thoracic aortic ectasia: Secondary | ICD-10-CM

## 2023-06-30 DIAGNOSIS — R0989 Other specified symptoms and signs involving the circulatory and respiratory systems: Secondary | ICD-10-CM

## 2023-06-30 DIAGNOSIS — I480 Paroxysmal atrial fibrillation: Secondary | ICD-10-CM | POA: Diagnosis not present

## 2023-06-30 DIAGNOSIS — I517 Cardiomegaly: Secondary | ICD-10-CM

## 2023-06-30 DIAGNOSIS — I4891 Unspecified atrial fibrillation: Secondary | ICD-10-CM

## 2023-06-30 LAB — ECHOCARDIOGRAM COMPLETE: S' Lateral: 2.8 cm

## 2023-06-30 NOTE — Telephone Encounter (Signed)
 Irving Burton with rhythm calling to report abnormal results

## 2023-06-30 NOTE — Telephone Encounter (Signed)
 Called pt back to let him know about EP referral that was placed. Address for [facility] office given to pt. He will await a call to schedule appointment with EP. Pt verbalizes understanding.

## 2023-06-30 NOTE — Telephone Encounter (Signed)
Referral made for EP.

## 2023-06-30 NOTE — Telephone Encounter (Signed)
   Cardiac Monitor Alert  Date of alert:  06/30/2023   Patient Name: Thomas Knight  DOB: July 23, 1945  MRN: [id number]   [facility] Cardiologist: Kardie Tobb, DO  [facility] EP:  None    Monitor Information: Long Term Monitor [ZioXT]  Reason:  paroxysmal atrial fibrillation Ordering provider:  Dr. Sheena :1}  Alert Atrial Fibrillation/Flutter This is the 1st alert for this rhythm.  The patient has a hx of Atrial Fibrillation/Flutter.  The patient is not currently on anticoagulation.  Anticoagulation medication as of 06/30/2023           ELIQUIS  5 MG TABS tablet Take 1 tablet (5 mg total) by mouth 2 (two) times daily.       Next Cardiology Appointment   Date:  recall to be seen in April 2025  Provider:  Dr. Sheena  The patient was contacted today.  He is asymptomatic. Arrhythmia, symptoms and history reviewed with Dr. Mona (DOD).  Plan:  Follow up with Dr. Sheena to discuss possible EP referral    Other: Pause also noted on final report- addressed with Dr. Mona- not reported per [employer].   Vlasta Baskin L, RN  06/30/2023 11:25 AM

## 2023-07-01 ENCOUNTER — Telehealth: Payer: Self-pay

## 2023-07-01 ENCOUNTER — Other Ambulatory Visit: Payer: Self-pay | Admitting: *Deleted

## 2023-07-01 DIAGNOSIS — R197 Diarrhea, unspecified: Secondary | ICD-10-CM

## 2023-07-01 DIAGNOSIS — C189 Malignant neoplasm of colon, unspecified: Secondary | ICD-10-CM

## 2023-07-01 MED ORDER — DIPHENOXYLATE-ATROPINE 2.5-0.025 MG PO TABS: 1.0000 | ORAL_TABLET | Freq: Four times a day (QID) | ORAL | 3 refills | Status: DC | PRN

## 2023-07-01 NOTE — Telephone Encounter (Signed)
 Called pt to set up an appointment per Dr. Mallory Shirk request. Pt will be seen 1/8.

## 2023-07-01 NOTE — Telephone Encounter (Signed)
 Patient called asking for refill on this prescription to be sent to [facility] in [place]. He is asking for 3 refills to be added to this Rx for future refills.

## 2023-07-04 ENCOUNTER — Ambulatory Visit: Payer: Medicare HMO | Admitting: Cardiology

## 2023-07-05 ENCOUNTER — Encounter: Payer: Self-pay | Admitting: Hematology

## 2023-07-06 ENCOUNTER — Ambulatory Visit: Payer: Medicare HMO | Attending: Cardiology | Admitting: Cardiology

## 2023-07-06 ENCOUNTER — Encounter: Payer: Self-pay | Admitting: Cardiology

## 2023-07-06 VITALS — BP 160/88 | HR 69 | Ht 71.0 in | Wt 235.8 lb

## 2023-07-06 DIAGNOSIS — I480 Paroxysmal atrial fibrillation: Secondary | ICD-10-CM | POA: Diagnosis not present

## 2023-07-06 DIAGNOSIS — Z79899 Other long term (current) drug therapy: Secondary | ICD-10-CM | POA: Diagnosis not present

## 2023-07-06 DIAGNOSIS — G4733 Obstructive sleep apnea (adult) (pediatric): Secondary | ICD-10-CM | POA: Diagnosis not present

## 2023-07-06 DIAGNOSIS — I2510 Atherosclerotic heart disease of native coronary artery without angina pectoris: Secondary | ICD-10-CM

## 2023-07-06 DIAGNOSIS — I4891 Unspecified atrial fibrillation: Secondary | ICD-10-CM

## 2023-07-06 DIAGNOSIS — I5032 Chronic diastolic (congestive) heart failure: Secondary | ICD-10-CM | POA: Diagnosis not present

## 2023-07-06 MED ORDER — AMIODARONE HCL 200 MG PO TABS: ORAL_TABLET | ORAL | 3 refills | Status: DC

## 2023-07-06 MED ORDER — SACUBITRIL-VALSARTAN 49-51 MG PO TABS: 1.0000 | ORAL_TABLET | Freq: Two times a day (BID) | ORAL | 30 refills | Status: DC

## 2023-07-06 NOTE — Patient Instructions (Addendum)
 Medication Instructions:  START: Amiodarone   Take 400 mg (two tablets) twice daily for 5 days then 200 mg (one tablet) twice daily for 7 days then  200 mg (one tablet) daily  INCREASE: Entresto  49-51 mg twice daily  *If you need a refill on your cardiac medications before your next appointment, please call your pharmacy*   Lab Work: CMET, Magnesium, TSH+FT4/FT3  If you have labs (blood work) drawn today and your tests are completely normal, you will receive your results only by: MyChart Message (if you have MyChart) OR A paper copy in the mail If you have any lab test that is abnormal or we need to change your treatment, we will call you to review the results.   Follow-Up: At [facility], you and your health needs are our priority.  As part of our continuing mission to provide you with exceptional heart care, we have created designated Provider Care Teams.  These Care Teams include your primary Cardiologist (physician) and Advanced Practice Providers (APPs -  Physician Assistants and Nurse Practitioners) who all work together to provide you with the care you need, when you need it.  We recommend signing up for the patient portal called MyChart.  Sign up information is provided on this After Visit Summary.  MyChart is used to connect with patients for Virtual Visits (Telemedicine).  Patients are able to view lab/test results, encounter notes, upcoming appointments, etc.  Non-urgent messages can be sent to your provider as well.   To learn more about what you can do with MyChart, go to [email or web address].    Your next appointment:   6 week(s)  Provider:   Kardie Tobb, DO

## 2023-07-06 NOTE — Progress Notes (Signed)
 Cardiology Office Note:    Date:  07/06/2023   ID:  Thomas Knight, DOB May 09, 1946, MRN [id number]  PCP:  Dottie Norleen PHEBE PONCE, MD  Cardiologist:  Rithy Mandley, DO  Electrophysiologist:  None   Referring MD: Dottie Norleen PHEBE PONCE, MD    I am really tired  History of Present Illness:    Thomas Knight is a 78 y.o. male with a hx of minimal coronary artery disease seen on coronary CTA, paroxysmal atrial fibrillation on Eliquis  twice daily and metoprolol  25 mg twice a day, he is status post cardioversion, hypertension, hyperlipidemia, morbid obesity, colon cancer status post chemotherapy and radiation currently in remission.  He has recently been noted to be back in atrial fibrillation.  Placed a monitor on the patient which showed continuous atrial fibrillation.  His biggest concern is the fact that he is significantly fatigue and is concerned about this.  He admits to some shortness of breath but no chest pain.  Past Medical History:  Diagnosis Date   Bilateral leg edema 08/24/2019   Cancer (HCC)    skin ca, colon   Chronic cough    Chronic pain of left knee 12/01/2015   COVID-19    06/2019   Dysrhythmia    afib  was cardioverted   Essential hypertension 08/24/2019   Mixed hyperlipidemia 08/24/2019   Obesity (BMI 30-39.9) 08/24/2019   Persistent atrial fibrillation (HCC) 08/24/2019    Past Surgical History:  Procedure Laterality Date   ACHILLES TENDON REPAIR Left 2012   Ruptured   BASAL CELL CARCINOMA EXCISION  01/2023   right nose, back   BRONCHIAL BIOPSY  02/09/2022   Procedure: BRONCHIAL BIOPSIES;  Surgeon: Brenna Adine CROME, DO;  Location: [facility];  Service: Pulmonary;;   BRONCHIAL NEEDLE ASPIRATION BIOPSY  02/09/2022   Procedure: BRONCHIAL NEEDLE ASPIRATION BIOPSIES;  Surgeon: Brenna Adine CROME, DO;  Location: [facility];  Service: Pulmonary;;   COLONOSCOPY WITH PROPOFOL  N/A 12/11/2021   Procedure: COLONOSCOPY WITH PROPOFOL ;  Surgeon: Rollin Dover, MD;  Location: [facility];  Service: Gastroenterology;  Laterality: N/A;   HERNIA REPAIR     umbilical hernia   IR IMAGING GUIDED PORT INSERTION  02/18/2022   SKIN SURGERY Left    Basal cell carcinoma on left forearm   XI ROBOTIC ASSISTED LOWER ANTERIOR RESECTION N/A 01/09/2021   Procedure: XI ROBOTIC ASSISTED RIGHT HEMI COLECTOMY, LYSIS OF ADHESIONS, SMALL BOWEL RESECTION, INTRAOPERATIVE ASSESMENT OF PERFUSION, PARTIAL OMENTECTOMY;  Surgeon: Teresa Lonni HERO, MD;  Location: [facility];  Service: General;  Laterality: N/A;    Current Medications: Current Meds  Medication Sig   acetaminophen  (TYLENOL ) 500 MG tablet Take 500-1,000 mg by mouth every 6 (six) hours as needed (pain.).   Ascorbic Acid  (VITAMIN C WITH ROSE HIPS) 500 MG tablet Take 500 mg by mouth every evening.   carvedilol  (COREG ) 12.5 MG tablet Take 1 tablet (12.5 mg total) by mouth 2 (two) times daily.   Cholecalciferol  (D3-1000) 25 MCG (1000 UT) capsule Take 1,000 Units by mouth every evening.   diphenoxylate -atropine  (LOMOTIL ) 2.5-0.025 MG tablet Take 1 tablet by mouth 4 (four) times daily as needed for diarrhea or loose stools.   ELIQUIS  5 MG TABS tablet Take 1 tablet (5 mg total) by mouth 2 (two) times daily.   Misc Natural Products (JOINT SUPPORT PO) Take 1 tablet by mouth daily. Instaflex Advanced Joint Support   nitroGLYCERIN  (NITROSTAT ) 0.4 MG SL tablet Place 1 tablet (0.4 mg total) under the tongue every 5 (five) minutes  as needed.   potassium chloride  SA (KLOR-CON  M) 20 MEQ tablet Take 1 tablet (20 mEq total) by mouth daily.   sacubitril -valsartan  (ENTRESTO ) 49-51 MG Take 1 tablet by mouth 2 (two) times daily.   vitamin B-12 (CYANOCOBALAMIN ) 1000 MCG tablet Take 1,000 mcg by mouth every evening.   Zinc 50 MG CAPS Take 50 mg by mouth every evening.   [DISCONTINUED] amiodarone  (PACERONE ) 200 MG tablet Take 400 mg (two tablets) twice daily for 5 days then 200 mg (one tablet) twice daily for 7 days then 200 mg (one tablet) daily    [DISCONTINUED] sacubitril -valsartan  (ENTRESTO ) 24-26 MG Take 1 tablet by mouth 2 (two) times daily.     Allergies:   Furosemide  and Tramadol    Social History   Socioeconomic History   Marital status: Married    Spouse name: Not on file   Number of children: Not on file   Years of education: Not on file   Highest education level: Not on file  Occupational History   Not on file  Tobacco Use   Smoking status: Never   Smokeless tobacco: Never  Vaping Use   Vaping status: Never Used  Substance and Sexual Activity   Alcohol use: Never   Drug use: Never   Sexual activity: Not Currently  Other Topics Concern   Not on file  Social History Narrative   Not on file   Social Drivers of Health   Financial Resource Strain: Not on file  Food Insecurity: No Food Insecurity (03/15/2023)   Hunger Vital Sign    Worried About Running Out of Food in the Last Year: Never true    Ran Out of Food in the Last Year: Never true  Transportation Needs: No Transportation Needs (03/15/2023)   PRAPARE - Administrator, Civil Service (Medical): No    Lack of Transportation (Non-Medical): No  Physical Activity: Not on file  Stress: Not on file  Social Connections: Not on file     Family History: The patient's family history includes Lung cancer in his father.  ROS:   Review of Systems  Constitution: Reports fatigue.  Negative for decreased appetite, fever and weight gain.  HENT: Negative for congestion, ear discharge, hoarse voice and sore throat.   Eyes: Negative for discharge, redness, vision loss in right eye and visual halos.  Cardiovascular: Negative for chest pain, dyspnea on exertion, leg swelling, orthopnea and palpitations.  Respiratory: Negative for cough, hemoptysis, shortness of breath and snoring.   Endocrine: Negative for heat intolerance and polyphagia.  Hematologic/Lymphatic: Negative for bleeding problem. Does not bruise/bleed easily.  Skin: Negative for flushing, nail  changes, rash and suspicious lesions.  Musculoskeletal: Negative for arthritis, joint pain, muscle cramps, myalgias, neck pain and stiffness.  Gastrointestinal: Negative for abdominal pain, bowel incontinence, diarrhea and excessive appetite.  Genitourinary: Negative for decreased libido, genital sores and incomplete emptying.  Neurological: Negative for brief paralysis, focal weakness, headaches and loss of balance.  Psychiatric/Behavioral: Negative for altered mental status, depression and suicidal ideas.  Allergic/Immunologic: Negative for HIV exposure and persistent infections.    EKGs/Labs/Other Studies Reviewed:    The following studies were reviewed today:   EKG:  The ekg ordered today demonstrates atrial fibrillation with controlled ventricular rate  Recent Labs: 05/30/2023: ALT 11; BUN 14; Creatinine, Ser 0.87; Hemoglobin 14.9; Platelets 139; Potassium 3.9; Sodium 141  Recent Lipid Panel No results found for: CHOL, TRIG, HDL, CHOLHDL, VLDL, LDLCALC, LDLDIRECT  Physical Exam:    VS:  BP ROLLEN)  160/88 (BP Location: Left Arm)   Pulse 69   Ht 5' 11 (1.803 m)   Wt 235 lb 12.8 oz (107 kg)   SpO2 97%   BMI 32.89 kg/m     Wt Readings from Last 3 Encounters:  07/06/23 235 lb 12.8 oz (107 kg)  06/06/23 234 lb 6.4 oz (106.3 kg)  06/02/23 231 lb 9.6 oz (105.1 kg)     GEN: Well nourished, well developed in no acute distress HEENT: Normal NECK: No JVD; No carotid bruits LYMPHATICS: No lymphadenopathy CARDIAC: S1S2 noted,RRR, no murmurs, rubs, gallops RESPIRATORY:  Clear to auscultation without rales, wheezing or rhonchi  ABDOMEN: Soft, non-tender, non-distended, +bowel sounds, no guarding. EXTREMITIES: No edema, No cyanosis, no clubbing MUSCULOSKELETAL:  No deformity  SKIN: Warm and dry NEUROLOGIC:  Alert and oriented x 3, non-focal PSYCHIATRIC:  Normal affect, good insight  ASSESSMENT:    1. Atrial fibrillation, unspecified type (HCC)   2. Medication  management   3. PAF (paroxysmal atrial fibrillation) (HCC)   4. Minimal CAD   5. Chronic diastolic heart failure (HCC)   6. Obstructive sleep apnea syndrome    PLAN:     He is symptomatic in atrial fibrillation -it will be best to get the patient in sinus rhythm giving his symptoms.  But for right now we are actually in a hard position as he needs to have a dental procedure done.  We have discussed details.  Shared decision at this point we will hold off on the DCCV.  I will start amiodarone  400 mg twice a day for 5 days then 200 mg twice a day 7 days then 200 mg.  In the meantime he will continue to pursue his cardioversion.  Which is about a week from now.  We discussed the stop his Eliquis  -Stop Eliquis  two days prior to dental procedure (scheduled for next Thursday at 11am), restart Eliquis  the day after the procedure. -Consider referral to electrophysiology for possible AFib ablation discussion.  -Check thyroid  function due to initiation of Amiodarone .   Hypertension Blood pressure slightly elevated. -Increase dose of Entresto  49-56 mg twice daily.  We discussed his echocardiogram result.  He has recovered EF.  No anginal symptoms.  Dental Health Tooth extraction needed, previously root canaled tooth from 2020. -Continue antibiotics as prescribed by dentist. -Proceed with tooth extraction as planned, with appropriate management of anticoagulation as discussed.  Follow-up in 6 weeks to reassess rhythm status and blood pressure control.  The patient is in agreement with the above plan. The patient left the office in stable condition.  The patient will follow up in   Medication Adjustments/Labs and Tests Ordered: Current medicines are reviewed at length with the patient today.  Concerns regarding medicines are outlined above.  Orders Placed This Encounter  Procedures   Comprehensive metabolic panel   Magnesium   TSH+T4F+T3Free   EKG 12-Lead   Meds ordered this encounter   Medications   sacubitril -valsartan  (ENTRESTO ) 49-51 MG    Sig: Take 1 tablet by mouth 2 (two) times daily.    Dispense:  180 tablet    Refill:  30   DISCONTD: amiodarone  (PACERONE ) 200 MG tablet    Sig: Take 400 mg (two tablets) twice daily for 5 days then 200 mg (one tablet) twice daily for 7 days then 200 mg (one tablet) daily    Dispense:  90 tablet    Refill:  3   amiodarone  (PACERONE ) 200 MG tablet    Sig: Take 400 mg (  two tablets) twice daily for 5 days then 200 mg (one tablet) twice daily for 7 days then 200 mg (one tablet) daily    Dispense:  180 tablet    Refill:  3    After initial fill remove  Take 400 mg (two tablets) twice daily for 5 days then 200 mg (one tablet) twice daily for 7 days then    Patient Instructions  Medication Instructions:  START: Amiodarone   Take 400 mg (two tablets) twice daily for 5 days then 200 mg (one tablet) twice daily for 7 days then  200 mg (one tablet) daily  INCREASE: Entresto  49-51 mg twice daily  *If you need a refill on your cardiac medications before your next appointment, please call your pharmacy*   Lab Work: CMET, Magnesium, TSH+FT4/FT3  If you have labs (blood work) drawn today and your tests are completely normal, you will receive your results only by: MyChart Message (if you have MyChart) OR A paper copy in the mail If you have any lab test that is abnormal or we need to change your treatment, we will call you to review the results.   Follow-Up: At [facility], you and your health needs are our priority.  As part of our continuing mission to provide you with exceptional heart care, we have created designated Provider Care Teams.  These Care Teams include your primary Cardiologist (physician) and Advanced Practice Providers (APPs -  Physician Assistants and Nurse Practitioners) who all work together to provide you with the care you need, when you need it.  We recommend signing up for the patient portal called  MyChart.  Sign up information is provided on this After Visit Summary.  MyChart is used to connect with patients for Virtual Visits (Telemedicine).  Patients are able to view lab/test results, encounter notes, upcoming appointments, etc.  Non-urgent messages can be sent to your provider as well.   To learn more about what you can do with MyChart, go to [email or web address].    Your next appointment:   6 week(s)  Provider:   Hope Holst, DO      Adopting a Healthy Lifestyle.  Know what a healthy weight is for you (roughly BMI <25) and aim to maintain this   Aim for 7+ servings of fruits and vegetables daily   65-80+ fluid ounces of water or unsweet tea for healthy kidneys   Limit to max 1 drink of alcohol per day; avoid smoking/tobacco   Limit animal fats in diet for cholesterol and heart health - choose grass fed whenever available   Avoid highly processed foods, and foods high in saturated/trans fats   Aim for low stress - take time to unwind and care for your mental health   Aim for 150 min of moderate intensity exercise weekly for heart health, and weights twice weekly for bone health   Aim for 7-9 hours of sleep daily   When it comes to diets, agreement about the perfect plan isnt easy to find, even among the experts. Experts at the [facility] [employer] developed an idea known as the Healthy Eating Plate. Just imagine a plate divided into logical, healthy portions.   The emphasis is on diet quality:   Load up on vegetables and fruits - one-half of your plate: Aim for color and variety, and remember that potatoes dont count.   Go for whole grains - one-quarter of your plate: Whole wheat, barley, wheat berries, quinoa, oats, brown rice, and foods made  with them. If you want pasta, go with whole wheat pasta.   Protein power - one-quarter of your plate: Fish, chicken, beans, and nuts are all healthy, versatile protein sources. Limit red meat.   The diet,  however, does go beyond the plate, offering a few other suggestions.   Use healthy plant oils, such as olive, canola, soy, corn, sunflower and peanut. Check the labels, and avoid partially hydrogenated oil, which have unhealthy trans fats.   If youre thirsty, drink water. Coffee and tea are good in moderation, but skip sugary drinks and limit milk and dairy products to one or two daily servings.   The type of carbohydrate in the diet is more important than the amount. Some sources of carbohydrates, such as vegetables, fruits, whole grains, and beans-are healthier than others.   Finally, stay active  Signed, Alyra Patty, DO  07/06/2023 2:59 PM    [facility]

## 2023-07-07 ENCOUNTER — Other Ambulatory Visit: Payer: Self-pay

## 2023-07-07 ENCOUNTER — Encounter: Payer: Self-pay | Admitting: Hematology

## 2023-07-07 ENCOUNTER — Telehealth: Payer: Self-pay | Admitting: Cardiology

## 2023-07-07 LAB — COMPREHENSIVE METABOLIC PANEL
ALT: 10 [IU]/L (ref 0–44)
AST: 14 [IU]/L (ref 0–40)
Albumin: 3.9 g/dL (ref 3.8–4.8)
Alkaline Phosphatase: 73 [IU]/L (ref 44–121)
BUN/Creatinine Ratio: 15 (ref 10–24)
BUN: 13 mg/dL (ref 8–27)
Bilirubin Total: 0.4 mg/dL (ref 0.0–1.2)
CO2: 23 mmol/L (ref 20–29)
Calcium: 8.6 mg/dL (ref 8.6–10.2)
Chloride: 105 mmol/L (ref 96–106)
Creatinine, Ser: 0.86 mg/dL (ref 0.76–1.27)
Globulin, Total: 2.5 g/dL (ref 1.5–4.5)
Glucose: 91 mg/dL (ref 70–99)
Potassium: 3.7 mmol/L (ref 3.5–5.2)
Sodium: 143 mmol/L (ref 134–144)
Total Protein: 6.4 g/dL (ref 6.0–8.5)
eGFR: 89 mL/min/{1.73_m2} (ref >59)

## 2023-07-07 LAB — TSH+T4F+T3FREE
Free T4: 1.28 ng/dL (ref 0.82–1.77)
T3, Free: 3.5 pg/mL (ref 2.0–4.4)
TSH: 5.33 u[IU]/mL — ABNORMAL HIGH (ref 0.450–4.500)

## 2023-07-07 LAB — MAGNESIUM: Magnesium: 2.1 mg/dL (ref 1.6–2.3)

## 2023-07-07 NOTE — Telephone Encounter (Signed)
 Tobb, Kardie, DO  You; [place], [place], [place] minutes ago (3:44 PM)    I have already given my  input for which he is not in agreement with so I have nothing more to add. Thank you

## 2023-07-07 NOTE — Telephone Encounter (Signed)
 Patient identification verified by 2 forms. Thomas Cooks, RN    Called and spoke to patient  Patient states:   -will be having dental extraction   -was advised by Dr. Sheena told hold Eliquis  for 48 hours prior and resume the day after   -Dr. Clancy with oral surgery [place] does not agree with holding Eliquis    -Dr. Clancy states he will be monitored in office for some time post extraction   -would like Dr. Princess additional input   -O[facility] [place] [phone] Informed patient message sent to Dr. Sheena

## 2023-07-07 NOTE — Telephone Encounter (Signed)
 Patient identification verified by 2 forms. Bertina Cooks, RN    Called and spoke to patient  Relayed provider message below  Advised patient to follow up with dental provider  Patient verbalized understanding, states he respects and appreciates Dr. Sheena recommendations

## 2023-07-07 NOTE — Telephone Encounter (Signed)
 Pt called in stating Dr. Servando Salina addressed yesterday at his appt eliquis hold prior to dental extraction and was told by her to hold for 48 hrs prior. Pt states his dental office does not want to hold prior to extraction. Please advise if this is okay.

## 2023-07-28 ENCOUNTER — Ambulatory Visit: Payer: Medicare HMO | Admitting: Cardiology

## 2023-08-01 ENCOUNTER — Ambulatory Visit (HOSPITAL_COMMUNITY)
Admission: RE | Admit: 2023-08-01 | Discharge: 2023-08-01 | Disposition: A | Discharge status: Home / Self Care | Payer: Medicare HMO | Source: Ambulatory Visit | Attending: Hematology | Admitting: Hematology

## 2023-08-01 ENCOUNTER — Inpatient Hospital Stay: Payer: Medicare HMO | Attending: Physician Assistant

## 2023-08-01 ENCOUNTER — Other Ambulatory Visit: Payer: Self-pay | Admitting: Nurse Practitioner

## 2023-08-01 ENCOUNTER — Other Ambulatory Visit: Payer: Self-pay | Admitting: *Deleted

## 2023-08-01 DIAGNOSIS — C182 Malignant neoplasm of ascending colon: Secondary | ICD-10-CM | POA: Insufficient documentation

## 2023-08-01 DIAGNOSIS — Z85118 Personal history of other malignant neoplasm of bronchus and lung: Secondary | ICD-10-CM | POA: Insufficient documentation

## 2023-08-01 DIAGNOSIS — I4891 Unspecified atrial fibrillation: Secondary | ICD-10-CM | POA: Insufficient documentation

## 2023-08-01 DIAGNOSIS — E860 Dehydration: Secondary | ICD-10-CM

## 2023-08-01 DIAGNOSIS — Z452 Encounter for adjustment and management of vascular access device: Secondary | ICD-10-CM | POA: Insufficient documentation

## 2023-08-01 DIAGNOSIS — Z08 Encounter for follow-up examination after completed treatment for malignant neoplasm: Secondary | ICD-10-CM | POA: Diagnosis not present

## 2023-08-01 DIAGNOSIS — C7800 Secondary malignant neoplasm of unspecified lung: Secondary | ICD-10-CM

## 2023-08-01 LAB — CBC WITH DIFFERENTIAL/PLATELET
Abs Immature Granulocytes: 0.01 10*3/uL (ref 0.00–0.07)
Basophils Absolute: 0.1 10*3/uL (ref 0.0–0.1)
Basophils Relative: 1 %
Eosinophils Absolute: 0.2 10*3/uL (ref 0.0–0.5)
Eosinophils Relative: 3 %
HCT: 46.7 % (ref 39.0–52.0)
Hemoglobin: 15.3 g/dL (ref 13.0–17.0)
Immature Granulocytes: 0 %
Lymphocytes Relative: 21 %
Lymphs Abs: 1.1 10*3/uL (ref 0.7–4.0)
MCH: 31.5 pg (ref 26.0–34.0)
MCHC: 32.8 g/dL (ref 30.0–36.0)
MCV: 96.1 fL (ref 80.0–100.0)
Monocytes Absolute: 0.6 10*3/uL (ref 0.1–1.0)
Monocytes Relative: 12 %
Neutro Abs: 3.3 10*3/uL (ref 1.7–7.7)
Neutrophils Relative %: 63 %
Platelets: 163 10*3/uL (ref 150–400)
RBC: 4.86 MIL/uL (ref 4.22–5.81)
RDW: 14.6 % (ref 11.5–15.5)
WBC: 5.3 10*3/uL (ref 4.0–10.5)
nRBC: 0 % (ref 0.0–0.2)

## 2023-08-01 LAB — COMPREHENSIVE METABOLIC PANEL
ALT: 11 U/L (ref 0–44)
AST: 15 U/L (ref 15–41)
Albumin: 3.8 g/dL (ref 3.5–5.0)
Alkaline Phosphatase: 58 U/L (ref 38–126)
Anion gap: 5 (ref 5–15)
BUN: 11 mg/dL (ref 8–23)
CO2: 28 mmol/L (ref 22–32)
Calcium: 8.7 mg/dL — ABNORMAL LOW (ref 8.9–10.3)
Chloride: 108 mmol/L (ref 98–111)
Creatinine, Ser: 1.06 mg/dL (ref 0.61–1.24)
GFR, Estimated: >60 mL/min (ref >60)
Glucose, Bld: 102 mg/dL — ABNORMAL HIGH (ref 70–99)
Potassium: 4 mmol/L (ref 3.5–5.1)
Sodium: 141 mmol/L (ref 135–145)
Total Bilirubin: 0.6 mg/dL (ref 0.0–1.2)
Total Protein: 6.7 g/dL (ref 6.5–8.1)

## 2023-08-01 LAB — CEA (ACCESS): CEA (CHCC): 3.15 ng/mL (ref 0.00–5.00)

## 2023-08-01 LAB — GLUCOSE, CAPILLARY: Glucose-Capillary: 111 mg/dL — ABNORMAL HIGH (ref 70–99)

## 2023-08-01 MED ORDER — SODIUM CHLORIDE 0.9% FLUSH
10.0000 mL | Freq: Once | INTRAVENOUS | Status: AC
  Administered 2023-08-01: 10 mL

## 2023-08-01 MED ORDER — FLUDEOXYGLUCOSE F - 18 (FDG) INJECTION
12.3200 | Freq: Once | INTRAVENOUS | Status: AC
  Administered 2023-08-01: 12.32 via INTRAVENOUS

## 2023-08-07 NOTE — Assessment & Plan Note (Signed)
-  stage II (pT3, N0), metastatic disease to lung and possible nodes in July 2023, MSS, KRAS/NRAS/BRAF wild type   -found on screening colonoscopy. S/p right hemicolectomy 01/09/21. -lung metastasis confirmed in [facility] by bronchoscopy on 02/09/22. -[facility] showed MSS, low tumor burden, KRAS/NRAS/BRAF wild-type, no other targetable mutations. The benefit of EGFR inhibitor is much less in right-sided colon cancer, so I will not give it as first line but may consider in subsequent lines.  -he started FOLFOX on 03/03/22. He tolerated well overall with diarrhea for several days. Bevacizumab added with C2 (9/20).  -CT scan 05/26/2022 showed improved lung mets, but showed new infiltrative lung change, possible related to his recent URI -repeated CT chest on 07/26/2022 showed stable known lung mets (overall excellent response, with minimum residual dsease) and resolved previously see inflammatory change in RLL -I have changed treatment to maintenance xeloda and beva every 3 weeks, he started C1 on 08/18/2022.  -due to recurrent diarrhea, I stopped Xeloda after 2 cycles -we have changed his treatment to 5-fu pump infusion and beva every 2 weeks on 4/24, he tolerated reasonably well, diarrhea has resolved now. -Restaging CT scan yesterday showed stable appearance of previously scattered small pulmonary nodules, measuring 4-75mm, no new lesions.  Overall has much improved compared to his initial CT scan from July 2023, and it is a hard to tell if this is residual disease versus scar tissue.  -We stopped his chemo after last scan and he is on cancer surveillance now. -Personally reviewed his follow-up CT scan from February 14, 2023, which showed slightly increase the size of the 2 nodule in the right lung, most consistent with disease progression.  No other new metastasis. -He completed SBRT on 04/11/2023

## 2023-08-08 ENCOUNTER — Encounter: Payer: Self-pay | Admitting: Hematology

## 2023-08-08 ENCOUNTER — Inpatient Hospital Stay (HOSPITAL_BASED_OUTPATIENT_CLINIC_OR_DEPARTMENT_OTHER): Payer: Medicare HMO | Admitting: Hematology

## 2023-08-08 VITALS — BP 154/61 | HR 44 | Temp 97.6°F | Resp 17 | Ht 71.0 in | Wt 242.1 lb

## 2023-08-08 DIAGNOSIS — C182 Malignant neoplasm of ascending colon: Secondary | ICD-10-CM

## 2023-08-08 DIAGNOSIS — Z452 Encounter for adjustment and management of vascular access device: Secondary | ICD-10-CM | POA: Diagnosis not present

## 2023-08-08 DIAGNOSIS — Z08 Encounter for follow-up examination after completed treatment for malignant neoplasm: Secondary | ICD-10-CM | POA: Diagnosis not present

## 2023-08-08 DIAGNOSIS — Z85118 Personal history of other malignant neoplasm of bronchus and lung: Secondary | ICD-10-CM | POA: Diagnosis not present

## 2023-08-08 DIAGNOSIS — I4891 Unspecified atrial fibrillation: Secondary | ICD-10-CM | POA: Diagnosis not present

## 2023-08-08 NOTE — Progress Notes (Signed)
 [facility]   [phone] Fax:([phone]   Clinic Follow up Note   Patient Care Team: Madelyne Schiff, MD as PCP - General (Family Medicine) Jerryl Morin, DO as PCP - Cardiology (Cardiology) Sonja Windsor, MD as Attending Physician (Hematology and Oncology)  Date of Service:  08/08/2023  CHIEF COMPLAINT: f/u of metastatic colon cancer to lung  CURRENT THERAPY:  Cancer surveillance  Oncology History   Cancer of right colon [facility]) -stage II (pT3, N0), metastatic disease to lung and possible nodes in July 2023, MSS, KRAS/NRAS/BRAF wild type   -found on screening colonoscopy. S/p right hemicolectomy 01/09/21. -lung metastasis confirmed in RUL by bronchoscopy on 02/09/22. -FoundationOne showed MSS, low tumor burden, KRAS/NRAS/BRAF wild-type, no other targetable mutations. The benefit of EGFR inhibitor is much less in right-sided colon cancer, so I will not give it as first line but may consider in subsequent lines.  -he started FOLFOX on 03/03/22. He tolerated well overall with diarrhea for several days. Bevacizumab  added with C2 (9/20).  -CT scan 05/26/2022 showed improved lung mets, but showed new infiltrative lung change, possible related to his recent URI -repeated CT chest on 07/26/2022 showed stable known lung mets (overall excellent response, with minimum residual dsease) and resolved previously see inflammatory change in RLL -I have changed treatment to maintenance xeloda  and beva every 3 weeks, he started C1 on 08/18/2022.  -due to recurrent diarrhea, I stopped Xeloda  after 2 cycles -we have changed his treatment to 5-fu pump infusion and beva every 2 weeks on 4/24, he tolerated reasonably well, diarrhea has resolved now. -Restaging CT scan yesterday showed stable appearance of previously scattered small pulmonary nodules, measuring 4-66mm, no new lesions.  Overall has much improved compared to his initial CT scan from July 2023, and it is a hard to tell if  this is residual disease versus scar tissue.  -We stopped his chemo after last scan and he is on cancer surveillance now. -Personally reviewed his follow-up CT scan from February 14, 2023, which showed slightly increase the size of the 2 nodule in the right lung, most consistent with disease progression.  No other new metastasis. -He completed SBRT on 04/11/2023     Assessment and Plan    Metastatic Colon Cancer Follow-up for metastatic colon cancer. Reports significant weight gain and improved appetite post-chemotherapy. No significant cough or dyspnea except on exertion. PET scan changes likely related to radiation therapy. Inflammation in the right lung noted, likely due to radiation. No definitive evidence of cancer progression or recurrence. Discussed potential for residual cancer and post-radiation inflammation. Guardian 360 blood test discussed but deferred (89% detection rate for residual cancer). Discussed possible future ablation therapy for small nodules. - Order CT scan in three months -I will review his CT scan with Dr. Jeryl Moris for his opinion - Schedule routine lab and port flush in six weeks - Monitor for symptoms such as dyspnea, cough, fatigue, or pain  Radiation-Induced Lung Inflammation Inflammation in the right lung likely due to high-dose radiation therapy. Inflammation appears more extensive than usual but is not causing significant symptoms. Discussed nature of radiation-induced inflammation and potential for scar tissue formation. Explained that inflammation may improve over time but could leave scar tissue. - Consult with Dr. Jeryl Moris regarding radiation effects - Monitor lung function and symptoms - Encourage use of spirometer two to three times daily - Advise on maintaining physical activity and a healthy diet  Atrial Fibrillation Atrial fibrillation managed by cardiologist. On medication for  heart rate control. Scheduled for cardioversion due to ineffective medication  management. - Proceed with scheduled cardioversion - Continue current heart rate control medication - Follow-up with cardiologist as scheduled  General Health Maintenance Maintaining an active lifestyle with regular exercise and social activities. No smoking history. Recent echocardiogram results were good. - Encourage continued physical activity and social engagement - Maintain a healthy diet - Monitor general health and report any new symptoms  Plan -I personally reviewed his CT images with patient and his family, and discussed the findings with radiologist.  There is extensive inflammation in the radiation field of right lung, no definitive evidence of residual cancer or new metastasis - Schedule routine lab and port flush in six weeks - Order CT scan in three months - Follow-up visit in three months post-CT scan.  -We discussed the role of circulating tumor DNA such as guardian review or Signatera, will hold off since he is getting CT scan frequently.        SUMMARY OF ONCOLOGIC HISTORY: Oncology History Overview Note   Cancer Staging  Cancer of right colon [facility]) Staging form: Colon and Rectum, AJCC 8th Edition - Pathologic stage from 01/09/2021: Stage IIA (pT3, pN0, cM0) - Signed by Sonja Mulberry, MD on 02/12/2022 Total positive nodes: 0 Histologic grading system: 4 grade system Histologic grade (G): G2 Residual tumor (R): R0 - None     Cancer of right colon (HCC)  10/2020 Procedure   Colonoscopy-Dr. Nickey Barn     11/06/2020 Imaging   CT CHEST ABDOMEN PELVIS W CONTRAST   IMPRESSION: 1. 4.6 cm partially circumferential apple-core type neoplastic process involving the mid transverse colon. No findings for locoregional adenopathy or distant metastatic disease. 2. Enlarged prostate gland with median lobe hypertrophy impressing on the base of the bladder. 3. 16 mm left thyroid  nodule. Recommend follow-up thyroid  ultrasound examination.   01/09/2021 Initial Diagnosis   Colon  cancer (HCC)   01/09/2021 Pathology Results   B. COLON, RIGHT, RESECTION:  -  Adenocarcinoma, moderately differentiated, 3.0 cm  -  No carcinoma identified in seventeen lymph nodes (0/17)  -  Margins uninvolved by carcinoma  -  Tubular adenoma (x2)  -  Benign appendix  -  See oncology table and comment below   Margin Status for Invasive Carcinoma: All margins negative for  invasive carcinoma       Distance from Invasive Carcinoma to Radial (Circumferential)       Margin Status for Non-Invasive Tumor: All margins negative for  high-grade dysplasia / intramucosal carcinoma and low-grade dysplasia  Regional Lymph Nodes:       Number of Lymph Nodes with Tumor: 0       Number of Lymph Nodes Examined: 17  Tumor Deposits: 0  Distant Metastasis:       Distant Site(s) Involved: Not applicable  Pathologic Stage Classification (pTNM, AJCC 8th Edition): pT3, pN0   MMR Stable    01/09/2021 Cancer Staging   Staging form: Colon and Rectum, AJCC 8th Edition - Pathologic stage from 01/09/2021: Stage IIA (pT3, pN0, cM0) - Signed by Sonja [place], MD on 02/12/2022 Total positive nodes: 0 Histologic grading system: 4 grade system Histologic grade (G): G2 Residual tumor (R): R0 - None   05/2021 Tumor Marker   Patient's tumor was tested for the following markers: CEA. Results of the tumor marker test revealed 2.1.   12/11/2021 Procedure   Colonoscopy-Dr. Nickey Barn  There was evidence of a prior functional end-to-end ileo-colonic anastomosis in the transverse colon. This was  patent and was characterized by healthy appearing mucosa. The anastomosis was traversed. Findings: Scattered small and large-mouthed diverticula were found in the sigmoid colon. - Patent functional end-to-end ileo-colonic anastomosis, characterized by healthy appearing mucosa. - Diverticulosis in the sigmoid colon. - No specimens collected.   01/25/2022 Imaging   CT CHEST ABDOMEN PELVIS W CONTRAST   IMPRESSION: 1. New right-sided  pulmonary nodules measure up to 11 mm, nonspecific but concerning for metastatic disease. Consider further evaluation with nuclear medicine PET/CT. 2. New nodularity/lymph nodes in the central mesentery, concerning for nodal disease involvement. 3. Slightly increased size of prominent retroperitoneal lymph nodes, nonspecific but also somewhat concerning for nodal disease involvement. 4. Surgical change of partial colectomy without evidence of local recurrence. 5. Stable prominent mediastinal lymph nodes, nonspecific but stability is reassuring. Attention on follow-up imaging suggested. 6. Stable prominent right external iliac lymph node, nonspecific but stability is reassuring. Attention on follow-up imaging suggested. 7. Stable hypodense 8 mm hepatic lesion technically too small to accurately characterize but stability is reassuring. Attention on follow-up imaging suggested. 8. 1.9 cm incidental left thyroid  nodule. Recommend thyroid  US . Reference: J Am Coll Radiol. 2015 Feb;12(2): 143-50 9.  Aortic Atherosclerosis (ICD10-I70.0).   02/09/2022 Procedure   Bronchoscopy under the care of Dr. Thelda Finney    02/09/2022 Pathology Results   FINAL MICROSCOPIC DIAGNOSIS:   A. LUNG, RUL, FINE NEEDLE ASPIRATION  BIOPSY FORCEPS:  Adenocarcinoma with morphologic features consistent with metastasis from a colorectal primary.  Please see comment.   Comment: The following immunostains are performed with appropriate controls :  TTF-1: Negative .  Napsin A: Negative .  CK7: Negative.  CK20: Positive.  CDX2: Positive .   The above immuno histochemical profile is supportive of the rendered diagnosis.    08/18/2022 - 08/18/2022 Chemotherapy   Patient is on Treatment Plan : COLORECTAL Bevacizumab  q14d     Colon cancer metastasized to lung [facility])  02/09/2022 Pathology Results   FINAL MICROSCOPIC DIAGNOSIS:   A. LUNG, RUL, FINE NEEDLE ASPIRATION  BIOPSY FORCEPS:  Adenocarcinoma with morphologic features  consistent with metastasis from a colorectal primary.  Please see comment.   Comment: The following immunostains are performed with appropriate controls :  TTF-1: Negative .  Napsin A: Negative .  CK7: Negative.  CK20: Positive.  CDX2: Positive .   The above immuno histochemical profile is supportive of the rendered diagnosis.    02/12/2022 Initial Diagnosis   Colon cancer metastasized to lung (HCC)   03/03/2022 - 07/31/2022 Chemotherapy   Patient is on Treatment Plan : COLORECTAL FOLFOX + Bevacizumab  q14d     07/26/2022 Imaging    IMPRESSION: 1. Near-complete interval resolution of nodular and masslike areas of irregular consolidation, likely resolving infection or organizing pneumonia/drug reaction. 2. Additional previously seen pulmonary nodules are stable, consistent with treated pulmonary metastatic disease. 3. Mild peribronchovascular reticular opacities which are most pronounced in the mid and lower lungs, similar to prior exam, progressed when compared with prior dated January 15, 2022. Findings can be seen in the setting of interstitial lung disease, pattern is most consistent with fibrotic NSIP. 4. Aortic Atherosclerosis (ICD10-I70.0).   07/26/2022 Imaging    IMPRESSION: 1. Near-complete interval resolution of nodular and masslike areas of irregular consolidation, likely resolving infection or organizing pneumonia/drug reaction. 2. Additional previously seen pulmonary nodules are stable, consistent with treated pulmonary metastatic disease. 3. Mild peribronchovascular reticular opacities which are most pronounced in the mid and lower lungs, similar to prior exam, progressed when compared with prior  dated January 15, 2022. Findings can be seen in the setting of interstitial lung disease, pattern is most consistent with fibrotic NSIP. 4. Aortic Atherosclerosis (ICD10-I70.0).   09/08/2022 - 09/08/2022 Chemotherapy   Patient is on Treatment Plan : COLORECTAL Bevacizumab  q21d      10/20/2022 -  Chemotherapy   Patient is on Treatment Plan : COLORECTAL 5FU + Leucovorin  (Modified DeGramont) + Bevacizumab  q14d        Discussed the use of AI scribe software for clinical note transcription with the patient, who gave verbal consent to proceed.  History of Present Illness   The patient, a 78 year old with a history of metastatic colon cancer, presents for a follow-up visit. He reports significant weight loss during chemotherapy, but has since regained most of the weight. He is now able to taste and smell food again and has an appetite. He has been exercising regularly, including walking, stretching, and resistance exercises. He reports occasional shortness of breath when carrying heavy items uphill, but otherwise has no respiratory symptoms. He also reports occasional coughing spells, but no habitual cough. He has a history of atrial fibrillation and is scheduled for a cardioversion.         All other systems were reviewed with the patient and are negative.  MEDICAL HISTORY:  Past Medical History:  Diagnosis Date   Bilateral leg edema 08/24/2019   Cancer (HCC)    skin ca, colon   Chronic cough    Chronic pain of left knee 12/01/2015   COVID-19    06/2019   Dysrhythmia    afib  was cardioverted   Essential hypertension 08/24/2019   Mixed hyperlipidemia 08/24/2019   Obesity (BMI 30-39.9) 08/24/2019   Persistent atrial fibrillation (HCC) 08/24/2019    SURGICAL HISTORY: Past Surgical History:  Procedure Laterality Date   ACHILLES TENDON REPAIR Left 2012   Ruptured   BASAL CELL CARCINOMA EXCISION  01/2023   right nose, back   BRONCHIAL BIOPSY  02/09/2022   Procedure: BRONCHIAL BIOPSIES;  Surgeon: Prudy Brownie, DO;  Location: [facility];  Service: Pulmonary;;   BRONCHIAL NEEDLE ASPIRATION BIOPSY  02/09/2022   Procedure: BRONCHIAL NEEDLE ASPIRATION BIOPSIES;  Surgeon: Prudy Brownie, DO;  Location: [facility];  Service: Pulmonary;;   COLONOSCOPY  WITH PROPOFOL  N/A 12/11/2021   Procedure: COLONOSCOPY WITH PROPOFOL ;  Surgeon: Alvis Jourdain, MD;  Location: [facility];  Service: Gastroenterology;  Laterality: N/A;   HERNIA REPAIR     umbilical hernia   IR IMAGING GUIDED PORT INSERTION  02/18/2022   SKIN SURGERY Left    Basal cell carcinoma on left forearm   XI ROBOTIC ASSISTED LOWER ANTERIOR RESECTION N/A 01/09/2021   Procedure: XI ROBOTIC ASSISTED RIGHT HEMI COLECTOMY, LYSIS OF ADHESIONS, SMALL BOWEL RESECTION, INTRAOPERATIVE ASSESMENT OF PERFUSION, PARTIAL OMENTECTOMY;  Surgeon: Melvenia Stabs, MD;  Location: [facility];  Service: General;  Laterality: N/A;    I have reviewed the social history and family history with the patient and they are unchanged from previous note.  ALLERGIES:  is allergic to furosemide  and tramadol .  MEDICATIONS:  Current Outpatient Medications  Medication Sig Dispense Refill   acetaminophen  (TYLENOL ) 500 MG tablet Take 500-1,000 mg by mouth every 6 (six) hours as needed (pain.).     amiodarone  (PACERONE ) 200 MG tablet Take 400 mg (two tablets) twice daily for 5 days then 200 mg (one tablet) twice daily for 7 days then 200 mg (one tablet) daily 180 tablet 3   Ascorbic Acid  (VITAMIN C WITH ROSE  HIPS) 500 MG tablet Take 500 mg by mouth every evening.     carvedilol  (COREG ) 12.5 MG tablet Take 1 tablet (12.5 mg total) by mouth 2 (two) times daily. 180 tablet 3   Cholecalciferol  (D3-1000) 25 MCG (1000 UT) capsule Take 1,000 Units by mouth every evening.     diphenoxylate -atropine  (LOMOTIL ) 2.5-0.025 MG tablet Take 1 tablet by mouth 4 (four) times daily as needed for diarrhea or loose stools. 30 tablet 3   ELIQUIS  5 MG TABS tablet Take 1 tablet (5 mg total) by mouth 2 (two) times daily. 180 tablet 1   Misc Natural Products (JOINT SUPPORT PO) Take 1 tablet by mouth daily. Instaflex Advanced Joint Support     nitroGLYCERIN  (NITROSTAT ) 0.4 MG SL tablet Place 1 tablet (0.4 mg total) under the tongue every 5 (five)  minutes as needed. 25 tablet 5   potassium chloride  SA (KLOR-CON  M) 20 MEQ tablet Take 1 tablet (20 mEq total) by mouth daily. 30 tablet 1   sacubitril -valsartan  (ENTRESTO ) 49-51 MG Take 1 tablet by mouth 2 (two) times daily. 180 tablet 30   vitamin B-12 (CYANOCOBALAMIN ) 1000 MCG tablet Take 1,000 mcg by mouth every evening.     Zinc 50 MG CAPS Take 50 mg by mouth every evening.     No current facility-administered medications for this visit.   Facility-Administered Medications Ordered in Other Visits  Medication Dose Route Frequency Provider Last Rate Last Admin   sodium chloride  flush (NS) 0.9 % injection 10 mL  10 mL Intracatheter PRN Sonja Merrifield, MD   10 mL at 11/19/22 0910    PHYSICAL EXAMINATION: ECOG PERFORMANCE STATUS: 0 - Asymptomatic  Vitals:   08/08/23 1054  BP: (!) 154/61  Pulse: (!) 44  Resp: 17  Temp: 97.6 F (36.4 C)  SpO2: 98%   Wt Readings from Last 3 Encounters:  08/08/23 109.8 kg  07/06/23 107 kg  06/06/23 106.3 kg     GENERAL:alert, no distress and comfortable SKIN: skin color, texture, turgor are normal, no rashes or significant lesions EYES: normal, Conjunctiva are pink and non-injected, sclera clear NECK: supple, thyroid  normal size, non-tender, without nodularity LYMPH:  no palpable lymphadenopathy in the cervical, axillary  LUNGS: clear to auscultation and percussion with normal breathing effort HEART: regular rate & rhythm and no murmurs and no lower extremity edema ABDOMEN:abdomen soft, non-tender and normal bowel sounds Musculoskeletal:no cyanosis of digits and no clubbing  NEURO: alert & oriented x 3 with fluent speech, no focal motor/sensory deficits    LABORATORY DATA:  I have reviewed the data as listed    Latest Ref Rng & Units 08/01/2023   10:31 AM 05/30/2023   10:01 AM 02/14/2023    3:32 PM  CBC  WBC 4.0 - 10.5 K/uL 5.3  5.1  5.8   Hemoglobin 13.0 - 17.0 g/dL 16.1  09.6  04.5   Hematocrit 39.0 - 52.0 % 46.7  44.5  44.9   Platelets  150 - 400 K/uL 163  139  145         Latest Ref Rng & Units 08/01/2023   10:31 AM 07/06/2023    5:45 PM 05/30/2023   10:01 AM  CMP  Glucose 70 - 99 mg/dL 409  91  811   BUN 8 - 23 mg/dL 11  13  14    Creatinine 0.61 - 1.24 mg/dL 9.14  7.82  9.56   Sodium 135 - 145 mmol/L 141  143  141   Potassium 3.5 - 5.1  mmol/L 4.0  3.7  3.9   Chloride 98 - 111 mmol/L 108  105  107   CO2 22 - 32 mmol/L 28  23  29    Calcium  8.9 - 10.3 mg/dL 8.7  8.6  8.6   Total Protein 6.5 - 8.1 g/dL 6.7  6.4  6.3   Total Bilirubin 0.0 - 1.2 mg/dL 0.6  0.4  0.6   Alkaline Phos 38 - 126 U/L 58  73  55   AST 15 - 41 U/L 15  14  13    ALT 0 - 44 U/L 11  10  11        RADIOGRAPHIC STUDIES: I have personally reviewed the radiological images as listed and agreed with the findings in the report. No results found.    Orders Placed This Encounter  Procedures   CT CHEST WO CONTRAST    Standing Status:   Future    Expected Date:   10/27/2023    Expiration Date:   08/07/2024    Preferred imaging location?:   [facility]   All questions were answered. The patient knows to call the clinic with any problems, questions or concerns. No barriers to learning was detected. The total time spent in the appointment was 40 minutes.     Sonja Gifford, MD 08/08/2023

## 2023-08-09 ENCOUNTER — Other Ambulatory Visit: Payer: Self-pay

## 2023-08-09 ENCOUNTER — Encounter: Payer: Self-pay | Admitting: Radiation Oncology

## 2023-08-09 ENCOUNTER — Other Ambulatory Visit: Payer: Self-pay | Admitting: *Deleted

## 2023-08-09 DIAGNOSIS — I480 Paroxysmal atrial fibrillation: Secondary | ICD-10-CM

## 2023-08-09 MED ORDER — ELIQUIS 5 MG PO TABS: 5.0000 mg | ORAL_TABLET | Freq: Two times a day (BID) | ORAL | 1 refills | Status: DC

## 2023-08-09 NOTE — Telephone Encounter (Signed)
Eliquis 5mg  refill request received. Patient is 78 years old, weight-109.8kg, Crea-1.06 on 08/01/23, Diagnosis-Afib, and last seen by Dr. Servando Salina on 07/06/23. Dose is appropriate based on dosing criteria. Will send in refill to requested pharmacy.

## 2023-08-10 ENCOUNTER — Other Ambulatory Visit: Payer: Self-pay

## 2023-08-22 ENCOUNTER — Encounter: Payer: Self-pay | Admitting: Cardiology

## 2023-08-22 ENCOUNTER — Ambulatory Visit: Payer: Medicare HMO | Attending: Cardiology | Admitting: Cardiology

## 2023-08-22 VITALS — BP 160/90 | HR 65 | Ht 71.0 in | Wt 241.6 lb

## 2023-08-22 DIAGNOSIS — G4733 Obstructive sleep apnea (adult) (pediatric): Secondary | ICD-10-CM | POA: Diagnosis not present

## 2023-08-22 DIAGNOSIS — D6869 Other thrombophilia: Secondary | ICD-10-CM | POA: Diagnosis not present

## 2023-08-22 DIAGNOSIS — Z79899 Other long term (current) drug therapy: Secondary | ICD-10-CM

## 2023-08-22 DIAGNOSIS — I10 Essential (primary) hypertension: Secondary | ICD-10-CM | POA: Diagnosis not present

## 2023-08-22 DIAGNOSIS — I4819 Other persistent atrial fibrillation: Secondary | ICD-10-CM | POA: Diagnosis not present

## 2023-08-22 NOTE — Patient Instructions (Signed)
 Medication Instructions:  Your physician recommends that you continue on your current medications as directed. Please refer to the Current Medication list given to you today.  *If you need a refill on your cardiac medications before your next appointment, please call your pharmacy*   Lab Work: Pre procedure labs -- we will call you to schedule:  BMP & CBC  If you have a lab test that is abnormal and we need to change your treatment, we will call you to review the results -- otherwise no news is good news.    Testing/Procedures: Your physician has requested that you have cardiac CT 1 month PRIOR to your ablation. Cardiac computed tomography (CT) is a painless test that uses an x-ray machine to take clear, detailed pictures of your heart. We will contact you if the result is abnormal. We will call you to schedule.  Your physician has recommended that you have an ablation. Catheter ablation is a medical procedure used to treat some cardiac arrhythmias (irregular heartbeats). During catheter ablation, a long, thin, flexible tube is put into a blood vessel in your groin (upper thigh), or neck. This tube is called an ablation catheter. It is then guided to your heart through the blood vessel. Radio frequency waves destroy small areas of heart tissue where abnormal heartbeats may cause an arrhythmia to start.   Your ablation is scheduled for ____________.   We will call you for further instructions.  Please call the office when you are ready to schedule/make decision.   Follow-Up: At [facility], you and your health needs are our priority.  As part of our continuing mission to provide you with exceptional heart care, we have created designated Provider Care Teams.  These Care Teams include your primary Cardiologist (physician) and Advanced Practice Providers (APPs -  Physician Assistants and Nurse Practitioners) who all work together to provide you with the care you need, when you need it.  Your  next appointment:   1 month(s) after your ablation  The format for your next appointment:   In Person  Provider:   AFib clinic   Thank you for choosing [facility]!!   Dory Horn, RN [phone]    Other Instructions   Cardiac Ablation Cardiac ablation is a procedure to destroy (ablate) some heart tissue that is sending bad signals. These bad signals cause problems in heart rhythm. The heart has many areas that make these signals. If there are problems in these areas, they can make the heart beat in a way that is not normal. Destroying some tissues can help make the heart rhythm normal. Tell your doctor about: Any allergies you have. All medicines you are taking. These include vitamins, herbs, eye drops, creams, and over-the-counter medicines. Any problems you or family members have had with medicines that make you fall asleep (anesthetics). Any blood disorders you have. Any surgeries you have had. Any medical conditions you have, such as kidney failure. Whether you are pregnant or may be pregnant. What are the risks? This is a safe procedure. But problems may occur, including: Infection. Bruising and bleeding. Bleeding into the chest. Stroke or blood clots. Damage to nearby areas of your body. Allergies to medicines or dyes. The need for a pacemaker if the normal system is damaged. Failure of the procedure to treat the problem. What happens before the procedure? Medicines Ask your doctor about: Changing or stopping your normal medicines. This is important. Taking aspirin and ibuprofen. Do not take these medicines unless your  doctor tells you to take them. Taking other medicines, vitamins, herbs, and supplements. General instructions Follow instructions from your doctor about what you cannot eat or drink. Plan to have someone take you home from the hospital or clinic. If you will be going home right after the procedure, plan to have someone with you for 24  hours. Ask your doctor what steps will be taken to prevent infection. What happens during the procedure?  An IV tube will be put into one of your veins. You will be given a medicine to help you relax. The skin on your neck or groin will be numbed. A cut (incision) will be made in your neck or groin. A needle will be put through your cut and into a large vein. A tube (catheter) will be put into the needle. The tube will be moved to your heart. Dye may be put through the tube. This helps your doctor see your heart. Small devices (electrodes) on the tube will send out signals. A type of energy will be used to destroy some heart tissue. The tube will be taken out. Pressure will be held on your cut. This helps stop bleeding. A bandage will be put over your cut. The exact procedure may vary among doctors and hospitals. What happens after the procedure? You will be watched until you leave the hospital or clinic. This includes checking your heart rate, breathing rate, oxygen, and blood pressure. Your cut will be watched for bleeding. You will need to lie still for a few hours. Do not drive for 24 hours or as long as your doctor tells you. Summary Cardiac ablation is a procedure to destroy some heart tissue. This is done to treat heart rhythm problems. Tell your doctor about any medical conditions you may have. Tell him or her about all medicines you are taking to treat them. This is a safe procedure. But problems may occur. These include infection, bruising, bleeding, and damage to nearby areas of your body. Follow what your doctor tells you about food and drink. You may also be told to change or stop some of your medicines. After the procedure, do not drive for 24 hours or as long as your doctor tells you. This information is not intended to replace advice given to you by your health care provider. Make sure you discuss any questions you have with your health care provider. Document Revised:  09/04/2021 Document Reviewed: 05/17/2019 Else[employer] Patient Education  2023 [employer]   Cardiac Ablation, Care After  This sheet gives you information about how to care for yourself after your procedure. Your health care provider may also give you more specific instructions. If you have problems or questions, contact your health care provider. What can I expect after the procedure? After the procedure, it is common to have: Bruising around your puncture site. Tenderness around your puncture site. Skipped heartbeats. If you had an atrial fibrillation ablation, you may have atrial fibrillation during the first several months after your procedure.  Tiredness (fatigue).  Follow these instructions at home: Puncture site care  Follow instructions from your health care provider about how to take care of your puncture site. Make sure you: If present, leave stitches (sutures), skin glue, or adhesive strips in place. These skin closures may need to stay in place for up to 2 weeks. If adhesive strip edges start to loosen and curl up, you may trim the loose edges. Do not remove adhesive strips completely unless your health care provider tells  you to do that. If a large square bandage is present, this may be removed 24 hours after surgery.  Check your puncture site every day for signs of infection. Check for: Redness, swelling, or pain. Fluid or blood. If your puncture site starts to bleed, lie down on your back, apply firm pressure to the area, and contact your health care provider. Warmth. Pus or a bad smell. A pea or small marble sized lump at the site is normal and can take up to three months to resolve.  Driving Do not drive for at least 4 days after your procedure or however long your health care provider recommends. (Do not resume driving if you have previously been instructed not to drive for other health reasons.) Do not drive or use heavy machinery while taking prescription pain  medicine. Activity Avoid activities that take a lot of effort for at least 7 days after your procedure. Do not lift anything that is heavier than 5 lb (4.5 kg) for one week.  No sexual activity for 1 week.  Return to your normal activities as told by your health care provider. Ask your health care provider what activities are safe for you. General instructions Take over-the-counter and prescription medicines only as told by your health care provider. Do not use any products that contain nicotine or tobacco, such as cigarettes and e-cigarettes. If you need help quitting, ask your health care provider. You may shower after 24 hours, but Do not take baths, swim, or use a hot tub for 1 week.  Do not drink alcohol for 24 hours after your procedure. Keep all follow-up visits as told by your health care provider. This is important. Contact a health care provider if: You have redness, mild swelling, or pain around your puncture site. You have fluid or blood coming from your puncture site that stops after applying firm pressure to the area. Your puncture site feels warm to the touch. You have pus or a bad smell coming from your puncture site. You have a fever. You have chest pain or discomfort that spreads to your neck, jaw, or arm. You have chest pain that is worse with lying on your back or taking a deep breath. You are sweating a lot. You feel nauseous. You have a fast or irregular heartbeat. You have shortness of breath. You are dizzy or light-headed and feel the need to lie down. You have pain or numbness in the arm or leg closest to your puncture site. Get help right away if: Your puncture site suddenly swells. Your puncture site is bleeding and the bleeding does not stop after applying firm pressure to the area. These symptoms may represent a serious problem that is an emergency. Do not wait to see if the symptoms will go away. Get medical help right away. Call your local emergency  services (911 in the [place] Do not drive yourself to the hospital. Summary After the procedure, it is normal to have bruising and tenderness at the puncture site in your groin, neck, or forearm. Check your puncture site every day for signs of infection. Get help right away if your puncture site is bleeding and the bleeding does not stop after applying firm pressure to the area. This is a medical emergency. This information is not intended to replace advice given to you by your health care provider. Make sure you discuss any questions you have with your health care provider.

## 2023-08-22 NOTE — Progress Notes (Signed)
 Electrophysiology Office Note:   Date:  08/22/2023  ID:  Thomas Knight, DOB 07/24/1945, MRN [id number]  Primary Cardiologist: Thomas Ripple, DO Primary Heart Failure: None Electrophysiologist: Thomas Larose Jorja Loa, MD      History of Present Illness:   Thomas Knight is a 78 y.o. male with h/o minimal coronary artery disease on coronary CTA, atrial fibrillation/flutter, hypertension, hyperlipidemia, obesity, colon cancer post chemotherapy and radiation seen today for  for Electrophysiology evaluation of atrial fibrillation at the request of Thomas Knight.    He wore a recent cardiac monitor that showed a 100% atrial fibrillation burden.  He has been fatigued and shortness of breath.  He has not had chest pain.  He is continue to be able to do most of his daily activities.  He does notice shortness of breath when carrying heavy objects up a hill.  He feels like his shortness of breath and fatigue is more since he has gone into atrial fibrillation.  He thinks he has been in atrial fibrillation since last summer.  Today, denies symptoms of palpitations, chest pain, orthopnea, PND, lower extremity edema, claudication, dizziness, presyncope, syncope, bleeding, or neurologic sequela. The patient is tolerating medications without difficulties.    Review of systems complete and found to be negative unless listed in HPI.   EP Information / Studies Reviewed:    EKG is ordered today. Personal review as below.  EKG Interpretation Date/Time:  Monday August 22 2023 08:57:41 EST Ventricular Rate:  65 PR Interval:    QRS Duration:  84 QT Interval:  402 QTC Calculation: 418 R Axis:   37  Text Interpretation: Atrial fibrillation Abnormal ECG When compared with ECG of 06-Jul-2023 10:05, No significant change was found Confirmed by Thomas Knight ([id number]) on 08/22/2023 9:04:06 AM     Risk Assessment/Calculations:    CHA2DS2-VASc Score = 4   This indicates a 4.8% annual risk of stroke. The patient's score  is based upon: CHF History: 0 HTN History: 1 Diabetes History: 0 Stroke History: 0 Vascular Disease History: 1 Age Score: 2 Gender Score: 0            Physical Exam:   VS:  BP (!) 160/90   Pulse 65   Ht 5\' 11"  (1.803 m)   Wt 241 lb 9.6 oz (109.6 kg)   SpO2 96%   BMI 33.70 kg/m    Wt Readings from Last 3 Encounters:  08/22/23 241 lb 9.6 oz (109.6 kg)  08/08/23 242 lb 1.6 oz (109.8 kg)  07/06/23 235 lb 12.8 oz (107 kg)     GEN: Well nourished, well developed in no acute distress NECK: No JVD; No carotid bruits CARDIAC: Regular rate and rhythm, no murmurs, rubs, gallops RESPIRATORY:  Clear to auscultation without rales, wheezing or rhonchi  ABDOMEN: Soft, non-tender, non-distended EXTREMITIES:  No edema; No deformity   ASSESSMENT AND PLAN:    1.  Persistent atrial fibrillation/flutter: Currently on amiodarone 200 mg daily.  He feels poorly in atrial fibrillation with fatigue, weakness, shortness of breath.  We did discuss further options for therapy including continuing with amiodarone versus ablation.  Due to long-term side effects from amiodarone, the patient would prefer this not to be a long-term medication.  Due to that, we Thomas Knight plan for ablation.  Risks and benefits have been discussed.  The patient understands these risks and is agreed to the procedure.  Risk, benefits, and alternatives to EP study and radiofrequency/pulse field ablation for afib were also discussed in detail today. These  risks include but are not limited to stroke, bleeding, vascular damage, tamponade, perforation, damage to the esophagus, lungs, and other structures, pulmonary vein stenosis, worsening renal function, and death. The patient understands these risk and wishes to proceed.  We Thomas Knight therefore proceed with catheter ablation at the next available time.  Carto, ICE, anesthesia are requested for the procedure.  Thomas Knight also obtain CT PV protocol prior to the procedure to exclude LAA thrombus and  further evaluate atrial anatomy.  2.  Hypertension: Elevated today.  He has not taken his morning medications.  Plan per primary physician and primary cardiology  3.  Secondary hypercoagulable state: Currently on Eliquis 5 mg twice daily for atrial fibrillation  4.  High risk medication monitoring: Currently on amiodarone.  LFTs within normal limits.  TSH mildly elevated.  Amiodarone Thomas Knight be a short-term medication.  5.  Obstructive sleep apnea: CPAP compliance encouraged  Case discussed with primary cardiology  Follow up with Dr. Elberta Knight as usual post procedure  Signed, Thomas Knight Jorja Loa, MD

## 2023-08-23 ENCOUNTER — Ambulatory Visit (INDEPENDENT_AMBULATORY_CARE_PROVIDER_SITE_OTHER): Payer: Medicare HMO | Admitting: Cardiology

## 2023-08-23 ENCOUNTER — Encounter: Payer: Self-pay | Admitting: Cardiology

## 2023-08-23 VITALS — BP 150/100 | HR 51 | Ht 71.0 in | Wt 243.8 lb

## 2023-08-23 DIAGNOSIS — G4733 Obstructive sleep apnea (adult) (pediatric): Secondary | ICD-10-CM

## 2023-08-23 DIAGNOSIS — I10 Essential (primary) hypertension: Secondary | ICD-10-CM | POA: Diagnosis not present

## 2023-08-23 DIAGNOSIS — I2510 Atherosclerotic heart disease of native coronary artery without angina pectoris: Secondary | ICD-10-CM

## 2023-08-23 DIAGNOSIS — E782 Mixed hyperlipidemia: Secondary | ICD-10-CM | POA: Diagnosis not present

## 2023-08-23 DIAGNOSIS — I4819 Other persistent atrial fibrillation: Secondary | ICD-10-CM | POA: Diagnosis not present

## 2023-08-23 DIAGNOSIS — I5032 Chronic diastolic (congestive) heart failure: Secondary | ICD-10-CM

## 2023-08-23 MED ORDER — SACUBITRIL-VALSARTAN 97-103 MG PO TABS: 1.0000 | ORAL_TABLET | Freq: Two times a day (BID) | ORAL | 3 refills | Status: AC

## 2023-08-23 NOTE — Progress Notes (Signed)
 Cardiology Office Note:    Date:  08/23/2023   ID:  Thomas Knight, DOB [birth year], MRN [id number]  PCP:  Annamaria Helling, DO  Cardiologist:  [name], DO  Electrophysiologist:  Regan Lemming, MD   Referring MD: Noni Saupe, MD   " I am really tired"  History of Present Illness:    Thomas Knight is a 78 y.o. male with a hx of minimal coronary artery disease seen on coronary CTA, paroxysmal atrial fibrillation on Eliquis twice daily and metoprolol 25 mg twice a day, he is status post cardioversion, hypertension, hyperlipidemia, morbid obesity, colon cancer status post chemotherapy and radiation currently in remission.  At his last visit with me we discussed his ZIO monitor.  At that time we talked about cardioversion versus starting amiodarone.  Shared decision opted for use of amiodarone.  In the meantime a send the patient does see our EP team for consideration for ablation.  He does not offer any specific complaints today In the office.  But wanted to get my opinion and discuss more on the ablation.  He is currently taking amiodarone and carvedilol but is in atrial fibrillation as of yesterday on his EKG.    Past Medical History:  Diagnosis Date   Bilateral leg edema 08/24/2019   Cancer (HCC)    skin ca, colon   Chronic cough    Chronic pain of left knee 12/01/2015   COVID-19    06/2019   Dysrhythmia    afib  was cardioverted   Essential hypertension 08/24/2019   Mixed hyperlipidemia 08/24/2019   Obesity (BMI 30-39.9) 08/24/2019   Persistent atrial fibrillation (HCC) 08/24/2019    Past Surgical History:  Procedure Laterality Date   ACHILLES TENDON REPAIR Left 2012   Ruptured   BASAL CELL CARCINOMA EXCISION  01/2023   right nose, back   BRONCHIAL BIOPSY  02/09/2022   Procedure: BRONCHIAL BIOPSIES;  Surgeon: Josephine Igo, DO;  Location: [facility];  Service: Pulmonary;;   BRONCHIAL NEEDLE ASPIRATION BIOPSY  02/09/2022   Procedure: BRONCHIAL  NEEDLE ASPIRATION BIOPSIES;  Surgeon: Josephine Igo, DO;  Location: [facility];  Service: Pulmonary;;   COLONOSCOPY WITH PROPOFOL N/A 12/11/2021   Procedure: COLONOSCOPY WITH PROPOFOL;  Surgeon: Jeani Hawking, MD;  Location: [facility];  Service: Gastroenterology;  Laterality: N/A;   HERNIA REPAIR     umbilical hernia   IR IMAGING GUIDED PORT INSERTION  02/18/2022   SKIN SURGERY Left    Basal cell carcinoma on left forearm   XI ROBOTIC ASSISTED LOWER ANTERIOR RESECTION N/A 01/09/2021   Procedure: XI ROBOTIC ASSISTED RIGHT HEMI COLECTOMY, LYSIS OF ADHESIONS, SMALL BOWEL RESECTION, INTRAOPERATIVE ASSESMENT OF PERFUSION, PARTIAL OMENTECTOMY;  Surgeon: Andria Meuse, MD;  Location: [facility];  Service: General;  Laterality: N/A;    Current Medications: Current Meds  Medication Sig   acetaminophen (TYLENOL) 500 MG tablet Take 500-1,000 mg by mouth every 6 (six) hours as needed (pain.).   amiodarone (PACERONE) 200 MG tablet Take 200 mg by mouth daily.   Ascorbic Acid (VITAMIN C WITH ROSE HIPS) 500 MG tablet Take 500 mg by mouth every evening.   carvedilol (COREG) 12.5 MG tablet Take 1 tablet (12.5 mg total) by mouth 2 (two) times daily.   Cholecalciferol (D3-1000) 25 MCG (1000 UT) capsule Take 1,000 Units by mouth every evening.   diphenoxylate-atropine (LOMOTIL) 2.5-0.025 MG tablet Take 1 tablet by mouth 4 (four) times daily as needed for diarrhea or loose stools.  ELIQUIS 5 MG TABS tablet Take 1 tablet (5 mg total) by mouth 2 (two) times daily.   Misc Natural Products (JOINT SUPPORT PO) Take 1 tablet by mouth daily. Instaflex Advanced Joint Support   nitroGLYCERIN (NITROSTAT) 0.4 MG SL tablet Place 1 tablet (0.4 mg total) under the tongue every 5 (five) minutes as needed.   sacubitril-valsartan (ENTRESTO) 97-103 MG Take 1 tablet by mouth 2 (two) times daily.   vitamin B-12 (CYANOCOBALAMIN) 1000 MCG tablet Take 1,000 mcg by mouth every evening.   Zinc 50 MG CAPS Take 50 mg by mouth  every evening.   [DISCONTINUED] sacubitril-valsartan (ENTRESTO) 49-51 MG Take 1 tablet by mouth 2 (two) times daily.     Allergies:   Furosemide and Tramadol   Social History   Socioeconomic History   Marital status: Married    Spouse name: Not on file   Number of children: Not on file   Years of education: Not on file   Highest education level: Not on file  Occupational History   Not on file  Tobacco Use   Smoking status: Never   Smokeless tobacco: Never  Vaping Use   Vaping status: Never Used  Substance and Sexual Activity   Alcohol use: Never   Drug use: Never   Sexual activity: Not Currently  Other Topics Concern   Not on file  Social History Narrative   Not on file   Social Drivers of Health   Financial Resource Strain: Not on file  Food Insecurity: No Food Insecurity (03/15/2023)   Hunger Vital Sign    Worried About Running Out of Food in the Last Year: Never true    Ran Out of Food in the Last Year: Never true  Transportation Needs: No Transportation Needs (03/15/2023)   PRAPARE - Administrator, Civil Service (Medical): No    Lack of Transportation (Non-Medical): No  Physical Activity: Not on file  Stress: Not on file  Social Connections: Not on file     Family History: The patient's family history includes Lung cancer in his father.  ROS:   Review of Systems  Constitution: Reports fatigue.  Negative for decreased appetite, fever and weight gain.  HENT: Negative for congestion, ear discharge, hoarse voice and sore throat.   Eyes: Negative for discharge, redness, vision loss in right eye and visual halos.  Cardiovascular: Negative for chest pain, dyspnea on exertion, leg swelling, orthopnea and palpitations.  Respiratory: Negative for cough, hemoptysis, shortness of breath and snoring.   Endocrine: Negative for heat intolerance and polyphagia.  Hematologic/Lymphatic: Negative for bleeding problem. Does not bruise/bleed easily.  Skin: Negative  for flushing, nail changes, rash and suspicious lesions.  Musculoskeletal: Negative for arthritis, joint pain, muscle cramps, myalgias, neck pain and stiffness.  Gastrointestinal: Negative for abdominal pain, bowel incontinence, diarrhea and excessive appetite.  Genitourinary: Negative for decreased libido, genital sores and incomplete emptying.  Neurological: Negative for brief paralysis, focal weakness, headaches and loss of balance.  Psychiatric/Behavioral: Negative for altered mental status, depression and suicidal ideas.  Allergic/Immunologic: Negative for HIV exposure and persistent infections.    EKGs/Labs/Other Studies Reviewed:    The following studies were reviewed today:   EKG:  The ekg ordered today demonstrates atrial fibrillation with controlled ventricular rate  Recent Labs: 07/06/2023: Magnesium 2.1; TSH 5.330 08/01/2023: ALT 11; BUN 11; Creatinine, Ser 1.06; Hemoglobin 15.3; Platelets 163; Potassium 4.0; Sodium 141  Recent Lipid Panel No results found for: "CHOL", "TRIG", "HDL", "CHOLHDL", "VLDL", "LDLCALC", "LDLDIRECT"  Physical Exam:    VS:  BP (!) 150/100 (BP Location: Right Arm, Patient Position: Sitting, Cuff Size: Normal)   Pulse (!) 51   Ht 5\' 11"  (1.803 m)   Wt 243 lb 12.8 oz (110.6 kg)   SpO2 98%   BMI 34.00 kg/m     Wt Readings from Last 3 Encounters:  08/23/23 243 lb 12.8 oz (110.6 kg)  08/22/23 241 lb 9.6 oz (109.6 kg)  08/08/23 242 lb 1.6 oz (109.8 kg)     GEN: Well nourished, well developed in no acute distress HEENT: Normal NECK: No JVD; No carotid bruits LYMPHATICS: No lymphadenopathy CARDIAC: S1S2 noted,RRR, no murmurs, rubs, gallops RESPIRATORY:  Clear to auscultation without rales, wheezing or rhonchi  ABDOMEN: Soft, non-tender, non-distended, +bowel sounds, no guarding. EXTREMITIES: No edema, No cyanosis, no clubbing MUSCULOSKELETAL:  No deformity  SKIN: Warm and dry NEUROLOGIC:  Alert and oriented x 3, non-focal PSYCHIATRIC:  Normal  affect, good insight  ASSESSMENT:    1. Persistent atrial fibrillation (HCC)   2. Essential hypertension   3. Minimal CAD   4. Chronic diastolic heart failure (HCC)   5. OSA (obstructive sleep apnea)   6. Mixed hyperlipidemia     PLAN:    Atrial Fibrillation Persistent despite Amiodarone. Discussed options of repeat cardioversion vs ablation. Given the previous successful cardioversion lasting 3 years and 3 months, the patient was considering another cardioversion. However, given the potential for longer term rhythm control, the recommendation was made for ablation.  He has agreed to proceed with ablation.  -Continue Amiodarone and carvedilol.  Continue Eliquis for stroke prevention -Order cardiac CT scan 1 month prior to ablation.  Hypertension Blood pressure elevated at today's visit (142/74), despite weight loss and current medications including Entresto. -Increase Entresto to 97-103 mg twice daily. -Goal blood pressure in the 130s.  Dilated cardiomyopathy/chronic diastolic heart failure-with recovered ejection fraction.  Continue current medication regimen.  Clinically euvolemic  CAD-no anginal symptoms  OSA continue current management   Insomnia Patient reports difficulty sleeping, often sleeping in the afternoon and staying awake at night. -Recommend primary care provider prescribe sleep aid.  Follow-up in 12 weeks, likely after the ablation.   Hypertension Blood pressure slightly elevated. -Increase dose of Entresto 49-56 mg twice daily.  We discussed his echocardiogram result.  He has recovered EF.  No anginal symptoms.  Dental Health Tooth extraction needed, previously root canaled tooth from 2020. -Continue antibiotics as prescribed by dentist. -Proceed with tooth extraction as planned, with appropriate management of anticoagulation as discussed.  Follow-up in 6 weeks to reassess rhythm status and blood pressure control.  The patient is in agreement with  the above plan. The patient left the office in stable condition.  The patient will follow up in   Medication Adjustments/Labs and Tests Ordered: Current medicines are reviewed at length with the patient today.  Concerns regarding medicines are outlined above.  No orders of the defined types were placed in this encounter.  Meds ordered this encounter  Medications   sacubitril-valsartan (ENTRESTO) 97-103 MG    Sig: Take 1 tablet by mouth 2 (two) times daily.    Dispense:  180 tablet    Refill:  3    Patient Instructions  Medication Instructions:  Your physician has recommended you make the following change in your medication:  INCREASE: Entresto 97-103 twice daily *If you need a refill on your cardiac medications before your next appointment, please call your pharmacy*   Follow-Up: At [facility],  you and your health needs are our priority.  As part of our continuing mission to provide you with exceptional heart care, we have created designated Provider Care Teams.  These Care Teams include your primary Cardiologist (physician) and Advanced Practice Providers (APPs -  Physician Assistants and Nurse Practitioners) who all work together to provide you with the care you need, when you need it.   Your next appointment:   12 week(s)  Provider:   [name], DO          Adopting a Healthy Lifestyle.  Know what a healthy weight is for you (roughly BMI <25) and aim to maintain this   Aim for 7+ servings of fruits and vegetables daily   65-80+ fluid ounces of water or unsweet tea for healthy kidneys   Limit to max 1 drink of alcohol per day; avoid smoking/tobacco   Limit animal fats in diet for cholesterol and heart health - choose grass fed whenever available   Avoid highly processed foods, and foods high in saturated/trans fats   Aim for low stress - take time to unwind and care for your mental health   Aim for 150 min of moderate intensity exercise weekly for heart  health, and weights twice weekly for bone health   Aim for 7-9 hours of sleep daily   When it comes to diets, agreement about the perfect plan isnt easy to find, even among the experts. Experts at the [facility] [employer] developed an idea known as the Healthy Eating Plate. Just imagine a plate divided into logical, healthy portions.   The emphasis is on diet quality:   Load up on vegetables and fruits - one-half of your plate: Aim for color and variety, and remember that potatoes dont count.   Go for whole grains - one-quarter of your plate: Whole wheat, barley, wheat berries, quinoa, oats, brown rice, and foods made with them. If you want pasta, go with whole wheat pasta.   Protein power - one-quarter of your plate: Fish, chicken, beans, and nuts are all healthy, versatile protein sources. Limit red meat.   The diet, however, does go beyond the plate, offering a few other suggestions.   Use healthy plant oils, such as olive, canola, soy, corn, sunflower and peanut. Check the labels, and avoid partially hydrogenated oil, which have unhealthy trans fats.   If youre thirsty, drink water. Coffee and tea are good in moderation, but skip sugary drinks and limit milk and dairy products to one or two daily servings.   The type of carbohydrate in the diet is more important than the amount. Some sources of carbohydrates, such as vegetables, fruits, whole grains, and beans-are healthier than others.   Finally, stay active  Signed, [name], DO  08/23/2023 2:24 PM    [facility]

## 2023-08-23 NOTE — Patient Instructions (Addendum)
 Medication Instructions:  Your physician has recommended you make the following change in your medication:  INCREASE: Entresto 97-103 twice daily *If you need a refill on your cardiac medications before your next appointment, please call your pharmacy*   Follow-Up: At [facility], you and your health needs are our priority.  As part of our continuing mission to provide you with exceptional heart care, we have created designated Provider Care Teams.  These Care Teams include your primary Cardiologist (physician) and Advanced Practice Providers (APPs -  Physician Assistants and Nurse Practitioners) who all work together to provide you with the care you need, when you need it.   Your next appointment:   12 week(s)  Provider:   [name], DO

## 2023-08-24 ENCOUNTER — Telehealth: Payer: Self-pay | Admitting: Pharmacy Technician

## 2023-08-24 ENCOUNTER — Other Ambulatory Visit (HOSPITAL_COMMUNITY): Payer: Self-pay

## 2023-08-24 NOTE — Telephone Encounter (Signed)
 Pharmacy Patient Advocate Encounter   Received notification from Fax that prior authorization for Entresto is required/requested.   Insurance verification completed.   The patient is insured through [employer] .   Per test claim: Refill too soon. Last filled 08/09/23. PA not needed- Patient has access.

## 2023-09-19 ENCOUNTER — Inpatient Hospital Stay: Payer: Medicare HMO | Attending: Physician Assistant

## 2023-09-19 ENCOUNTER — Other Ambulatory Visit: Payer: Self-pay

## 2023-09-19 DIAGNOSIS — Z85038 Personal history of other malignant neoplasm of large intestine: Secondary | ICD-10-CM | POA: Diagnosis not present

## 2023-09-19 DIAGNOSIS — E860 Dehydration: Secondary | ICD-10-CM

## 2023-09-19 DIAGNOSIS — Z452 Encounter for adjustment and management of vascular access device: Secondary | ICD-10-CM | POA: Insufficient documentation

## 2023-09-19 DIAGNOSIS — C7800 Secondary malignant neoplasm of unspecified lung: Secondary | ICD-10-CM

## 2023-09-19 DIAGNOSIS — C182 Malignant neoplasm of ascending colon: Secondary | ICD-10-CM

## 2023-09-19 LAB — CBC WITH DIFFERENTIAL/PLATELET
Abs Immature Granulocytes: 0.01 10*3/uL (ref 0.00–0.07)
Basophils Absolute: 0 10*3/uL (ref 0.0–0.1)
Basophils Relative: 1 %
Eosinophils Absolute: 0.1 10*3/uL (ref 0.0–0.5)
Eosinophils Relative: 2 %
HCT: 46.7 % (ref 39.0–52.0)
Hemoglobin: 15.4 g/dL (ref 13.0–17.0)
Immature Granulocytes: 0 %
Lymphocytes Relative: 24 %
Lymphs Abs: 1.2 10*3/uL (ref 0.7–4.0)
MCH: 31.3 pg (ref 26.0–34.0)
MCHC: 33 g/dL (ref 30.0–36.0)
MCV: 94.9 fL (ref 80.0–100.0)
Monocytes Absolute: 0.7 10*3/uL (ref 0.1–1.0)
Monocytes Relative: 13 %
Neutro Abs: 2.9 10*3/uL (ref 1.7–7.7)
Neutrophils Relative %: 60 %
Platelets: 161 10*3/uL (ref 150–400)
RBC: 4.92 MIL/uL (ref 4.22–5.81)
RDW: 15 % (ref 11.5–15.5)
WBC: 5 10*3/uL (ref 4.0–10.5)
nRBC: 0 % (ref 0.0–0.2)

## 2023-09-19 LAB — COMPREHENSIVE METABOLIC PANEL
ALT: 11 U/L (ref 0–44)
AST: 12 U/L — ABNORMAL LOW (ref 15–41)
Albumin: 3.9 g/dL (ref 3.5–5.0)
Alkaline Phosphatase: 55 U/L (ref 38–126)
Anion gap: 4 — ABNORMAL LOW (ref 5–15)
BUN: 13 mg/dL (ref 8–23)
CO2: 28 mmol/L (ref 22–32)
Calcium: 8.7 mg/dL — ABNORMAL LOW (ref 8.9–10.3)
Chloride: 108 mmol/L (ref 98–111)
Creatinine, Ser: 1.06 mg/dL (ref 0.61–1.24)
GFR, Estimated: >60 mL/min (ref >60)
Glucose, Bld: 90 mg/dL (ref 70–99)
Potassium: 4 mmol/L (ref 3.5–5.1)
Sodium: 140 mmol/L (ref 135–145)
Total Bilirubin: 0.6 mg/dL (ref 0.0–1.2)
Total Protein: 6.9 g/dL (ref 6.5–8.1)

## 2023-09-19 LAB — CEA (ACCESS): CEA (CHCC): 2.66 ng/mL (ref 0.00–5.00)

## 2023-09-19 MED ORDER — SODIUM CHLORIDE 0.9% FLUSH
10.0000 mL | Freq: Once | INTRAVENOUS | Status: AC
Start: 2023-09-19 — End: 2023-09-19
  Administered 2023-09-19: 10 mL

## 2023-09-19 MED ORDER — HEPARIN SOD (PORK) LOCK FLUSH 100 UNIT/ML IV SOLN
500.0000 [IU] | Freq: Once | INTRAVENOUS | Status: AC
  Administered 2023-09-19: 500 [IU]

## 2023-09-20 ENCOUNTER — Telehealth: Payer: Self-pay | Admitting: *Deleted

## 2023-09-20 DIAGNOSIS — I4819 Other persistent atrial fibrillation: Secondary | ICD-10-CM

## 2023-09-20 NOTE — Telephone Encounter (Signed)
 Followed up with pt on procedure (ablation) date.  He is scheduled for lung CT on 5/1 to look at lung nodule, sees MD on 5/8 to discuss findings.  He and I agreed to speak on 5/8 or 5/9 to determine if proceeding with ablation. Patient understands we will arrange afib ablation pre procedure CT for the week of 5/12 and that office would be in contact at a later date to go over instructions.  We will wait on instructions until confirming if can proceed or not. Informed that I would go ahead an place CT order and they may call  to schedule before we make final decision. Patient verbalized understanding and agreeable to plan.

## 2023-09-20 NOTE — Addendum Note (Signed)
 Addended by: Baird Lyons on: 09/20/2023 10:22 AM   Modules accepted: Orders

## 2023-10-04 ENCOUNTER — Encounter: Payer: Self-pay | Admitting: Nurse Practitioner

## 2023-10-24 ENCOUNTER — Encounter: Payer: Self-pay | Admitting: Hematology

## 2023-10-27 ENCOUNTER — Ambulatory Visit (HOSPITAL_COMMUNITY)
Admission: RE | Admit: 2023-10-27 | Discharge: 2023-10-27 | Disposition: A | Discharge status: Home / Self Care | Payer: Medicare HMO | Source: Ambulatory Visit | Attending: Hematology | Admitting: Hematology

## 2023-10-27 ENCOUNTER — Other Ambulatory Visit: Payer: Self-pay

## 2023-10-27 ENCOUNTER — Inpatient Hospital Stay: Payer: Medicare HMO | Attending: Physician Assistant

## 2023-10-27 VITALS — BP 174/75 | HR 66 | Temp 98.7°F | Resp 18

## 2023-10-27 DIAGNOSIS — C182 Malignant neoplasm of ascending colon: Secondary | ICD-10-CM | POA: Insufficient documentation

## 2023-10-27 DIAGNOSIS — Z79899 Other long term (current) drug therapy: Secondary | ICD-10-CM | POA: Diagnosis not present

## 2023-10-27 DIAGNOSIS — C189 Malignant neoplasm of colon, unspecified: Secondary | ICD-10-CM

## 2023-10-27 DIAGNOSIS — E860 Dehydration: Secondary | ICD-10-CM

## 2023-10-27 DIAGNOSIS — C7801 Secondary malignant neoplasm of right lung: Secondary | ICD-10-CM | POA: Insufficient documentation

## 2023-10-27 DIAGNOSIS — R0602 Shortness of breath: Secondary | ICD-10-CM | POA: Diagnosis not present

## 2023-10-27 LAB — COMPREHENSIVE METABOLIC PANEL WITH GFR
ALT: 24 U/L (ref 0–44)
AST: 19 U/L (ref 15–41)
Albumin: 3.8 g/dL (ref 3.5–5.0)
Alkaline Phosphatase: 57 U/L (ref 38–126)
Anion gap: 3 — ABNORMAL LOW (ref 5–15)
BUN: 14 mg/dL (ref 8–23)
CO2: 30 mmol/L (ref 22–32)
Calcium: 8.5 mg/dL — ABNORMAL LOW (ref 8.9–10.3)
Chloride: 108 mmol/L (ref 98–111)
Creatinine, Ser: 1.13 mg/dL (ref 0.61–1.24)
GFR, Estimated: >60 mL/min (ref >60)
Glucose, Bld: 97 mg/dL (ref 70–99)
Potassium: 3.8 mmol/L (ref 3.5–5.1)
Sodium: 141 mmol/L (ref 135–145)
Total Bilirubin: 0.7 mg/dL (ref 0.0–1.2)
Total Protein: 6.5 g/dL (ref 6.5–8.1)

## 2023-10-27 LAB — CBC WITH DIFFERENTIAL/PLATELET
Abs Immature Granulocytes: 0.01 10*3/uL (ref 0.00–0.07)
Basophils Absolute: 0 10*3/uL (ref 0.0–0.1)
Basophils Relative: 1 %
Eosinophils Absolute: 0.2 10*3/uL (ref 0.0–0.5)
Eosinophils Relative: 3 %
HCT: 45.3 % (ref 39.0–52.0)
Hemoglobin: 15.4 g/dL (ref 13.0–17.0)
Immature Granulocytes: 0 %
Lymphocytes Relative: 23 %
Lymphs Abs: 1.2 10*3/uL (ref 0.7–4.0)
MCH: 31.6 pg (ref 26.0–34.0)
MCHC: 34 g/dL (ref 30.0–36.0)
MCV: 92.8 fL (ref 80.0–100.0)
Monocytes Absolute: 0.7 10*3/uL (ref 0.1–1.0)
Monocytes Relative: 14 %
Neutro Abs: 3.1 10*3/uL (ref 1.7–7.7)
Neutrophils Relative %: 59 %
Platelets: 156 10*3/uL (ref 150–400)
RBC: 4.88 MIL/uL (ref 4.22–5.81)
RDW: 15.6 % — ABNORMAL HIGH (ref 11.5–15.5)
WBC: 5.3 10*3/uL (ref 4.0–10.5)
nRBC: 0 % (ref 0.0–0.2)

## 2023-10-27 LAB — CEA (ACCESS): CEA (CHCC): 2.6 ng/mL (ref 0.00–5.00)

## 2023-10-27 MED ORDER — HEPARIN SOD (PORK) LOCK FLUSH 100 UNIT/ML IV SOLN
500.0000 [IU] | Freq: Once | INTRAVENOUS | Status: AC | PRN
Start: 2023-10-27 — End: 2023-10-27
  Administered 2023-10-27: 500 [IU]

## 2023-10-27 MED ORDER — SODIUM CHLORIDE 0.9% FLUSH
10.0000 mL | Freq: Once | INTRAVENOUS | Status: AC | PRN
  Administered 2023-10-27: 10 mL

## 2023-10-27 NOTE — Progress Notes (Signed)
 Patient arrived for port flush and labs, CT w/o contrast scheduled today. BP 177/78, retake 174/75. Patient stated he did not take hypertensive meds today and says he has noticed SOB the last few weeks upon activity. Maryalice Smaller, MD and Abbie Abbey, RN notified. Directed by RN to send him on to CT scan. I encouraged patient to take his medication, keep up with BP readings and symptoms, call provider if symptoms continue, and to go to the ER if symptoms worsen.

## 2023-11-02 NOTE — Assessment & Plan Note (Signed)
-  stage II (pT3, N0), metastatic disease to lung and possible nodes in July 2023, MSS, KRAS/NRAS/BRAF wild type   -found on screening colonoscopy. S/p right hemicolectomy 01/09/21. -lung metastasis confirmed in [facility] by bronchoscopy on 02/09/22. -[facility] showed MSS, low tumor burden, KRAS/NRAS/BRAF wild-type, no other targetable mutations. The benefit of EGFR inhibitor is much less in right-sided colon cancer, so I will not give it as first line but may consider in subsequent lines.  -he started FOLFOX on 03/03/22. He tolerated well overall with diarrhea for several days. Bevacizumab added with C2 (9/20).  -CT scan 05/26/2022 showed improved lung mets, but showed new infiltrative lung change, possible related to his recent URI -repeated CT chest on 07/26/2022 showed stable known lung mets (overall excellent response, with minimum residual dsease) and resolved previously see inflammatory change in RLL -I have changed treatment to maintenance xeloda and beva every 3 weeks, he started C1 on 08/18/2022.  -due to recurrent diarrhea, I stopped Xeloda after 2 cycles -we have changed his treatment to 5-fu pump infusion and beva every 2 weeks on 4/24, he tolerated reasonably well, diarrhea has resolved now. -Restaging CT scan yesterday showed stable appearance of previously scattered small pulmonary nodules, measuring 4-75mm, no new lesions.  Overall has much improved compared to his initial CT scan from July 2023, and it is a hard to tell if this is residual disease versus scar tissue.  -We stopped his chemo after last scan and he is on cancer surveillance now. -Personally reviewed his follow-up CT scan from February 14, 2023, which showed slightly increase the size of the 2 nodule in the right lung, most consistent with disease progression.  No other new metastasis. -He completed SBRT on 04/11/2023

## 2023-11-03 ENCOUNTER — Inpatient Hospital Stay

## 2023-11-03 ENCOUNTER — Other Ambulatory Visit: Payer: Self-pay

## 2023-11-03 ENCOUNTER — Encounter: Payer: Self-pay | Admitting: Hematology

## 2023-11-03 ENCOUNTER — Inpatient Hospital Stay (HOSPITAL_BASED_OUTPATIENT_CLINIC_OR_DEPARTMENT_OTHER): Payer: Medicare HMO | Admitting: Hematology

## 2023-11-03 ENCOUNTER — Other Ambulatory Visit: Payer: Self-pay | Admitting: *Deleted

## 2023-11-03 VITALS — BP 138/82 | HR 45 | Temp 98.4°F | Resp 21 | Ht 71.0 in | Wt 246.0 lb

## 2023-11-03 DIAGNOSIS — C182 Malignant neoplasm of ascending colon: Secondary | ICD-10-CM

## 2023-11-03 DIAGNOSIS — E860 Dehydration: Secondary | ICD-10-CM

## 2023-11-03 DIAGNOSIS — C7800 Secondary malignant neoplasm of unspecified lung: Secondary | ICD-10-CM

## 2023-11-03 DIAGNOSIS — C7801 Secondary malignant neoplasm of right lung: Secondary | ICD-10-CM | POA: Diagnosis not present

## 2023-11-03 DIAGNOSIS — R0602 Shortness of breath: Secondary | ICD-10-CM | POA: Diagnosis not present

## 2023-11-03 DIAGNOSIS — Z79899 Other long term (current) drug therapy: Secondary | ICD-10-CM | POA: Diagnosis not present

## 2023-11-03 MED ORDER — HEPARIN SOD (PORK) LOCK FLUSH 100 UNIT/ML IV SOLN
500.0000 [IU] | Freq: Once | INTRAVENOUS | Status: AC | PRN
Start: 2023-11-03 — End: 2023-11-03
  Administered 2023-11-03: 500 [IU]

## 2023-11-03 MED ORDER — SODIUM CHLORIDE 0.9% FLUSH
10.0000 mL | Freq: Once | INTRAVENOUS | Status: AC | PRN
  Administered 2023-11-03: 10 mL

## 2023-11-03 NOTE — Progress Notes (Signed)
 As per Dr. Maryalice Smaller, placed order in portal for [employer] Reveal. Took paperwork and kit to portflush to be drawn 05/08. Attached paperwork needed for the GR.

## 2023-11-03 NOTE — Progress Notes (Signed)
 [facility]   [phone]) [phone] Fax:([phone]) [phone]   Clinic Follow up Note   Patient Care Team: Russell Court, DO as PCP - General (Family Medicine) Jerryl Morin, DO as PCP - Cardiology (Cardiology) Lei Pump, MD as PCP - Electrophysiology (Cardiology) Sonja [place], MD as Attending Physician (Hematology and Oncology)  Date of Service:  11/03/2023  CHIEF COMPLAINT: f/u of metastatic colon cancer to lung  CURRENT THERAPY:  Cancer surveillance  Oncology History   Cancer of right colon [facility]) -stage II (pT3, N0), metastatic disease to lung and possible nodes in July 2023, MSS, KRAS/NRAS/BRAF wild type   -found on screening colonoscopy. S/p right hemicolectomy 01/09/21. -lung metastasis confirmed in RUL by bronchoscopy on 02/09/22. -FoundationOne showed MSS, low tumor burden, KRAS/NRAS/BRAF wild-type, no other targetable mutations. The benefit of EGFR inhibitor is much less in right-sided colon cancer, so I will not give it as first line but may consider in subsequent lines.  -he started FOLFOX on 03/03/22. He tolerated well overall with diarrhea for several days. Bevacizumab  added with C2 (9/20).  -CT scan 05/26/2022 showed improved lung mets, but showed new infiltrative lung change, possible related to his recent URI -repeated CT chest on 07/26/2022 showed stable known lung mets (overall excellent response, with minimum residual dsease) and resolved previously see inflammatory change in RLL -I have changed treatment to maintenance xeloda  and beva every 3 weeks, he started C1 on 08/18/2022.  -due to recurrent diarrhea, I stopped Xeloda  after 2 cycles -we have changed his treatment to 5-fu pump infusion and beva every 2 weeks on 4/24, he tolerated reasonably well, diarrhea has resolved now. -Restaging CT scan yesterday showed stable appearance of previously scattered small pulmonary nodules, measuring 4-9mm, no new lesions.  Overall has much improved compared to  his initial CT scan from July 2023, and it is a hard to tell if this is residual disease versus scar tissue.  -We stopped his chemo after last scan and he is on cancer surveillance now. -Personally reviewed his follow-up CT scan from February 14, 2023, which showed slightly increase the size of the 2 nodule in the right lung, most consistent with disease progression.  No other new metastasis. -He completed SBRT on 04/11/2023   Assessment & Plan Colon cancer with pulmonary metastasis Recent CT scan reveals an enlarging 0.9 cm (from 0.6cm) nodule in the left lung, raising concern for metastasis. The right lung shows changes consistent with prior radiation, and a 0.3 cm nodule that is indeterminate. Differential diagnosis includes radiation changes, inflammatory, or metastasis. Decision-making involves considering a PET scan or circulating tumor DNA test for cancer recurrence. Shared decision-making includes discussing potential radiation or bronchoscopy ablation if the nodule is confirmed as cancerous.  -The ctDNA blood test is considered more sensitive than imaging for detecting recurrence, with an 80-90% specificity if positive. - Order ctDNA blood GuardantReveal test to assess for cancer recurrence. - Consult Dr. Jeryl Moris regarding potential radiation therapy of left enlarging lung nodule. - Consult Dr. Thelda Finney regarding bronchoscopy ablation of the left lung nodule. - Consider PET scan or repeating CT in 2 months if blood test results are inconclusive. - Monitor the right lung nodule with follow-up imaging.   Shortness of breath Mild shortness of breath, possibly related to radiation changes in the right lung. No significant concern for cardiac or pulmonary causes at this time. He is scheduled for a CT scan of the heart and an ablation procedure for atrial fibrillation, which may also contribute to  shortness of breath. The ablation is not expected to affect lung function. - Proceed with scheduled CT  scan of the heart and ablation procedure for atrial fibrillation. - Monitor symptoms and reassess if shortness of breath worsens.  Hypocalcemia  Calcium  levels are slightly low. He is currently taking vitamin D , which aids in calcium  absorption. The decision-making process includes supplementing with calcium  to address the deficiency. - Recommend over-the-counter calcium  supplements, 600 mg once or twice daily.  Plan - I personally reviewed his restaging CT scan images with patient and his family, and compared multiple previous CT and PET scan images - Will obtain CT DNA guardianreveal today  - Will discuss his case with Dr. Jeryl Moris and Dr. Thelda Finney regarding treatment options -phone visit in 1-2 weeks    SUMMARY OF ONCOLOGIC HISTORY: Oncology History Overview Note   Cancer Staging  Cancer of right colon [facility]) Staging form: Colon and Rectum, AJCC 8th Edition - Pathologic stage from 01/09/2021: Stage IIA (pT3, pN0, cM0) - Signed by Sonja [place], MD on 02/12/2022 Total positive nodes: 0 Histologic grading system: 4 grade system Histologic grade (G): G2 Residual tumor (R): R0 - None     Cancer of right colon (HCC)  10/2020 Procedure   Colonoscopy-Dr. Nickey Barn     11/06/2020 Imaging   CT CHEST ABDOMEN PELVIS W CONTRAST   IMPRESSION: 1. 4.6 cm partially circumferential apple-core type neoplastic process involving the mid transverse colon. No findings for locoregional adenopathy or distant metastatic disease. 2. Enlarged prostate gland with median lobe hypertrophy impressing on the base of the bladder. 3. 16 mm left thyroid  nodule. Recommend follow-up thyroid  ultrasound examination.   01/09/2021 Initial Diagnosis   Colon cancer (HCC)   01/09/2021 Pathology Results   B. COLON, RIGHT, RESECTION:  -  Adenocarcinoma, moderately differentiated, 3.0 cm  -  No carcinoma identified in seventeen lymph nodes (0/17)  -  Margins uninvolved by carcinoma  -  Tubular adenoma (x2)  -  Benign appendix   -  See oncology table and comment below   Margin Status for Invasive Carcinoma: All margins negative for  invasive carcinoma       Distance from Invasive Carcinoma to Radial (Circumferential)       Margin Status for Non-Invasive Tumor: All margins negative for  high-grade dysplasia / intramucosal carcinoma and low-grade dysplasia  Regional Lymph Nodes:       Number of Lymph Nodes with Tumor: 0       Number of Lymph Nodes Examined: 17  Tumor Deposits: 0  Distant Metastasis:       Distant Site(s) Involved: Not applicable  Pathologic Stage Classification (pTNM, AJCC 8th Edition): pT3, pN0   MMR Stable    01/09/2021 Cancer Staging   Staging form: Colon and Rectum, AJCC 8th Edition - Pathologic stage from 01/09/2021: Stage IIA (pT3, pN0, cM0) - Signed by Sonja , MD on 02/12/2022 Total positive nodes: 0 Histologic grading system: 4 grade system Histologic grade (G): G2 Residual tumor (R): R0 - None   05/2021 Tumor Marker   Patient's tumor was tested for the following markers: CEA. Results of the tumor marker test revealed 2.1.   12/11/2021 Procedure   Colonoscopy-Dr. Nickey Barn  There was evidence of a prior functional end-to-end ileo-colonic anastomosis in the transverse colon. This was patent and was characterized by healthy appearing mucosa. The anastomosis was traversed. Findings: Scattered small and large-mouthed diverticula were found in the sigmoid colon. - Patent functional end-to-end ileo-colonic anastomosis, characterized by healthy appearing mucosa. - Diverticulosis  in the sigmoid colon. - No specimens collected.   01/25/2022 Imaging   CT CHEST ABDOMEN PELVIS W CONTRAST   IMPRESSION: 1. New right-sided pulmonary nodules measure up to 11 mm, nonspecific but concerning for metastatic disease. Consider further evaluation with nuclear medicine PET/CT. 2. New nodularity/lymph nodes in the central mesentery, concerning for nodal disease involvement. 3. Slightly increased  size of prominent retroperitoneal lymph nodes, nonspecific but also somewhat concerning for nodal disease involvement. 4. Surgical change of partial colectomy without evidence of local recurrence. 5. Stable prominent mediastinal lymph nodes, nonspecific but stability is reassuring. Attention on follow-up imaging suggested. 6. Stable prominent right external iliac lymph node, nonspecific but stability is reassuring. Attention on follow-up imaging suggested. 7. Stable hypodense 8 mm hepatic lesion technically too small to accurately characterize but stability is reassuring. Attention on follow-up imaging suggested. 8. 1.9 cm incidental left thyroid  nodule. Recommend thyroid  US . Reference: J Am Coll Radiol. 2015 Feb;12(2): 143-50 9.  Aortic Atherosclerosis (ICD10-I70.0).   02/09/2022 Procedure   Bronchoscopy under the care of Dr. Thelda Finney    02/09/2022 Pathology Results   FINAL MICROSCOPIC DIAGNOSIS:   A. LUNG, RUL, FINE NEEDLE ASPIRATION  BIOPSY FORCEPS:  Adenocarcinoma with morphologic features consistent with metastasis from a colorectal primary.  Please see comment.   Comment: The following immunostains are performed with appropriate controls :  TTF-1: Negative .  Napsin A: Negative .  CK7: Negative.  CK20: Positive.  CDX2: Positive .   The above immuno histochemical profile is supportive of the rendered diagnosis.    08/18/2022 - 08/18/2022 Chemotherapy   Patient is on Treatment Plan : COLORECTAL Bevacizumab  q14d     Colon cancer metastasized to lung [facility])  02/09/2022 Pathology Results   FINAL MICROSCOPIC DIAGNOSIS:   A. LUNG, RUL, FINE NEEDLE ASPIRATION  BIOPSY FORCEPS:  Adenocarcinoma with morphologic features consistent with metastasis from a colorectal primary.  Please see comment.   Comment: The following immunostains are performed with appropriate controls :  TTF-1: Negative .  Napsin A: Negative .  CK7: Negative.  CK20: Positive.  CDX2: Positive .   The above  immuno histochemical profile is supportive of the rendered diagnosis.    02/12/2022 Initial Diagnosis   Colon cancer metastasized to lung (HCC)   03/03/2022 - 07/31/2022 Chemotherapy   Patient is on Treatment Plan : COLORECTAL FOLFOX + Bevacizumab  q14d     07/26/2022 Imaging    IMPRESSION: 1. Near-complete interval resolution of nodular and masslike areas of irregular consolidation, likely resolving infection or organizing pneumonia/drug reaction. 2. Additional previously seen pulmonary nodules are stable, consistent with treated pulmonary metastatic disease. 3. Mild peribronchovascular reticular opacities which are most pronounced in the mid and lower lungs, similar to prior exam, progressed when compared with prior dated January 15, 2022. Findings can be seen in the setting of interstitial lung disease, pattern is most consistent with fibrotic NSIP. 4. Aortic Atherosclerosis (ICD10-I70.0).   07/26/2022 Imaging    IMPRESSION: 1. Near-complete interval resolution of nodular and masslike areas of irregular consolidation, likely resolving infection or organizing pneumonia/drug reaction. 2. Additional previously seen pulmonary nodules are stable, consistent with treated pulmonary metastatic disease. 3. Mild peribronchovascular reticular opacities which are most pronounced in the mid and lower lungs, similar to prior exam, progressed when compared with prior dated January 15, 2022. Findings can be seen in the setting of interstitial lung disease, pattern is most consistent with fibrotic NSIP. 4. Aortic Atherosclerosis (ICD10-I70.0).   09/08/2022 - 09/08/2022 Chemotherapy   Patient is on  Treatment Plan : COLORECTAL Bevacizumab  q21d     10/20/2022 -  Chemotherapy   Patient is on Treatment Plan : COLORECTAL 5FU + Leucovorin  (Modified [place]ont) + Bevacizumab  q14d        Discussed the use of AI scribe software for clinical note transcription with the patient, who gave verbal consent to  proceed.  History of Present Illness Thomas Knight is a 78 year old male with colon cancer who presents for follow-up. He is accompanied by his daughter.  He experiences shortness of breath when walking to the mailbox and when lying flat, but not when sitting in a recliner or walking short distances indoors. He has regained some weight after a previous loss of 48 pounds, now weighing approximately 236 pounds. A recent CT scan revealed changes in the right lung attributed to previous radiation and a new 0.9 cm nodule in the left lung. Additionally, there is a 0.3 cm nodule in the right middle lobe, which is very small and difficult to visualize. Recent blood tests showed normal tumor markers and blood counts, with a slight decrease in calcium  levels. He is currently taking vitamin D .     All other systems were reviewed with the patient and are negative.  MEDICAL HISTORY:  Past Medical History:  Diagnosis Date   Bilateral leg edema 08/24/2019   Cancer (HCC)    skin ca, colon   Chronic cough    Chronic pain of left knee 12/01/2015   COVID-19    06/2019   Dysrhythmia    afib  was cardioverted   Essential hypertension 08/24/2019   Mixed hyperlipidemia 08/24/2019   Obesity (BMI 30-39.9) 08/24/2019   Persistent atrial fibrillation (HCC) 08/24/2019    SURGICAL HISTORY: Past Surgical History:  Procedure Laterality Date   ACHILLES TENDON REPAIR Left 2012   Ruptured   BASAL CELL CARCINOMA EXCISION  01/2023   right nose, back   BRONCHIAL BIOPSY  02/09/2022   Procedure: BRONCHIAL BIOPSIES;  Surgeon: Prudy Brownie, DO;  Location: [facility];  Service: Pulmonary;;   BRONCHIAL NEEDLE ASPIRATION BIOPSY  02/09/2022   Procedure: BRONCHIAL NEEDLE ASPIRATION BIOPSIES;  Surgeon: Prudy Brownie, DO;  Location: [facility];  Service: Pulmonary;;   COLONOSCOPY WITH PROPOFOL  N/A 12/11/2021   Procedure: COLONOSCOPY WITH PROPOFOL ;  Surgeon: Alvis Jourdain, MD;  Location: [facility];  Service:  Gastroenterology;  Laterality: N/A;   HERNIA REPAIR     umbilical hernia   IR IMAGING GUIDED PORT INSERTION  02/18/2022   SKIN SURGERY Left    Basal cell carcinoma on left forearm   XI ROBOTIC ASSISTED LOWER ANTERIOR RESECTION N/A 01/09/2021   Procedure: XI ROBOTIC ASSISTED RIGHT HEMI COLECTOMY, LYSIS OF ADHESIONS, SMALL BOWEL RESECTION, INTRAOPERATIVE ASSESMENT OF PERFUSION, PARTIAL OMENTECTOMY;  Surgeon: Melvenia Stabs, MD;  Location: [facility] ORS;  Service: General;  Laterality: N/A;    I have reviewed the social history and family history with the patient and they are unchanged from previous note.  ALLERGIES:  is allergic to furosemide  and tramadol .  MEDICATIONS:  Current Outpatient Medications  Medication Sig Dispense Refill   acetaminophen  (TYLENOL ) 500 MG tablet Take 500-1,000 mg by mouth every 6 (six) hours as needed (pain.).     amiodarone  (PACERONE ) 200 MG tablet Take 200 mg by mouth daily.     Ascorbic Acid  (VITAMIN C WITH ROSE HIPS) 500 MG tablet Take 500 mg by mouth every evening.     Cholecalciferol  (D3-1000) 25 MCG (1000 UT) capsule Take 1,000 Units by mouth every  evening.     diphenoxylate -atropine  (LOMOTIL ) 2.5-0.025 MG tablet Take 1 tablet by mouth 4 (four) times daily as needed for diarrhea or loose stools. 30 tablet 3   ELIQUIS  5 MG TABS tablet Take 1 tablet (5 mg total) by mouth 2 (two) times daily. 180 tablet 1   Misc Natural Products (JOINT SUPPORT PO) Take 1 tablet by mouth daily. Instaflex Advanced Joint Support     potassium chloride  SA (KLOR-CON  M) 20 MEQ tablet Take 1 tablet (20 mEq total) by mouth daily. 30 tablet 1   sacubitril -valsartan  (ENTRESTO ) 97-103 MG Take 1 tablet by mouth 2 (two) times daily. 180 tablet 3   vitamin B-12 (CYANOCOBALAMIN ) 1000 MCG tablet Take 1,000 mcg by mouth every evening.     Zinc 50 MG CAPS Take 50 mg by mouth every evening.     carvedilol  (COREG ) 12.5 MG tablet Take 1 tablet (12.5 mg total) by mouth 2 (two) times daily. 180  tablet 3   nitroGLYCERIN  (NITROSTAT ) 0.4 MG SL tablet Place 1 tablet (0.4 mg total) under the tongue every 5 (five) minutes as needed. 25 tablet 5   No current facility-administered medications for this visit.   Facility-Administered Medications Ordered in Other Visits  Medication Dose Route Frequency Provider Last Rate Last Admin   sodium chloride  flush (NS) 0.9 % injection 10 mL  10 mL Intracatheter PRN Sonja [place], MD   10 mL at 11/19/22 0910    PHYSICAL EXAMINATION: ECOG PERFORMANCE STATUS: 1 - Symptomatic but completely ambulatory  Vitals:   11/03/23 1108  BP: 138/82  Pulse: (!) 45  Resp: (!) 21  Temp: 98.4 F (36.9 C)  SpO2: 97%   Wt Readings from Last 3 Encounters:  11/03/23 246 lb (111.6 kg)  08/23/23 243 lb 12.8 oz (110.6 kg)  08/22/23 241 lb 9.6 oz (109.6 kg)     GENERAL:alert, no distress and comfortable SKIN: skin color, texture, turgor are normal, no rashes or significant lesions EYES: normal, Conjunctiva are pink and non-injected, sclera clear NECK: supple, thyroid  normal size, non-tender, without nodularity LYMPH:  no palpable lymphadenopathy in the cervical, axillary  LUNGS: clear to auscultation and percussion with normal breathing effort HEART: regular rate & rhythm and no murmurs and no lower extremity edema ABDOMEN:abdomen soft, non-tender and normal bowel sounds Musculoskeletal:no cyanosis of digits and no clubbing  NEURO: alert & oriented x 3 with fluent speech, no focal motor/sensory deficits  Physical Exam   LABORATORY DATA:  I have reviewed the data as listed    Latest Ref Rng & Units 10/27/2023   10:50 AM 09/19/2023   10:48 AM 08/01/2023   10:31 AM  CBC  WBC 4.0 - 10.5 K/uL 5.3  5.0  5.3   Hemoglobin 13.0 - 17.0 g/dL 16.1  09.6  04.5   Hematocrit 39.0 - 52.0 % 45.3  46.7  46.7   Platelets 150 - 400 K/uL 156  161  163         Latest Ref Rng & Units 10/27/2023   10:50 AM 09/19/2023   10:48 AM 08/01/2023   10:31 AM  CMP  Glucose 70 - 99 mg/dL  97  90  409   BUN 8 - 23 mg/dL 14  13  11    Creatinine 0.61 - 1.24 mg/dL 8.11  9.14  7.82   Sodium 135 - 145 mmol/L 141  140  141   Potassium 3.5 - 5.1 mmol/L 3.8  4.0  4.0   Chloride 98 - 111 mmol/L 108  108  108   CO2 22 - 32 mmol/L 30  28  28    Calcium  8.9 - 10.3 mg/dL 8.5  8.7  8.7   Total Protein 6.5 - 8.1 g/dL 6.5  6.9  6.7   Total Bilirubin 0.0 - 1.2 mg/dL 0.7  0.6  0.6   Alkaline Phos 38 - 126 U/L 57  55  58   AST 15 - 41 U/L 19  12  15    ALT 0 - 44 U/L 24  11  11        RADIOGRAPHIC STUDIES: I have personally reviewed the radiological images as listed and agreed with the findings in the report. No results found.    No orders of the defined types were placed in this encounter.  All questions were answered. The patient knows to call the clinic with any problems, questions or concerns. No barriers to learning was detected. The total time spent in the appointment was 40 minutes, including review of chart and various tests results, discussions about plan of care and coordination of care plan     Sonja [place], MD 11/03/2023

## 2023-11-05 DIAGNOSIS — L814 Other melanin hyperpigmentation: Secondary | ICD-10-CM | POA: Diagnosis not present

## 2023-11-05 DIAGNOSIS — L821 Other seborrheic keratosis: Secondary | ICD-10-CM | POA: Diagnosis not present

## 2023-11-05 DIAGNOSIS — Z85820 Personal history of malignant melanoma of skin: Secondary | ICD-10-CM | POA: Diagnosis not present

## 2023-11-07 ENCOUNTER — Ambulatory Visit (HOSPITAL_COMMUNITY)
Admission: RE | Admit: 2023-11-07 | Discharge: 2023-11-07 | Disposition: A | Discharge status: Home / Self Care | Source: Ambulatory Visit | Attending: Cardiology | Admitting: Cardiology

## 2023-11-07 DIAGNOSIS — I4819 Other persistent atrial fibrillation: Secondary | ICD-10-CM

## 2023-11-07 MED ORDER — IOHEXOL 350 MG/ML SOLN
100.0000 mL | Freq: Once | INTRAVENOUS | Status: AC | PRN
  Administered 2023-11-07: 100 mL via INTRAVENOUS

## 2023-11-07 NOTE — OR Nursing (Signed)
 Iv removed pt asymptomic

## 2023-11-09 DIAGNOSIS — C182 Malignant neoplasm of ascending colon: Secondary | ICD-10-CM | POA: Diagnosis not present

## 2023-11-11 ENCOUNTER — Encounter: Payer: Self-pay | Admitting: Hematology

## 2023-11-11 LAB — GUARDANT REVEAL

## 2023-11-14 ENCOUNTER — Ambulatory Visit: Payer: Self-pay | Admitting: Cardiology

## 2023-11-14 ENCOUNTER — Telehealth: Payer: Self-pay | Admitting: Cardiology

## 2023-11-14 NOTE — Telephone Encounter (Signed)
 Thomas Knight with [facility] called to provide approval information:  Case #: [id number] Date(s) of Service: 11/14/23 - 05/16/24 CPT Code: 93656--quantity of 1  Phone#: [phone]  Per christie, a letter with this information is being mailed to the office as well. If questions, a call may be returned to the phone number above.

## 2023-11-15 ENCOUNTER — Encounter: Payer: Self-pay | Admitting: Cardiology

## 2023-11-15 ENCOUNTER — Ambulatory Visit: Payer: Medicare HMO | Attending: Cardiology | Admitting: Cardiology

## 2023-11-15 VITALS — BP 158/88 | HR 55 | Ht 71.0 in | Wt 245.0 lb

## 2023-11-15 DIAGNOSIS — I5032 Chronic diastolic (congestive) heart failure: Secondary | ICD-10-CM

## 2023-11-15 DIAGNOSIS — I2510 Atherosclerotic heart disease of native coronary artery without angina pectoris: Secondary | ICD-10-CM

## 2023-11-15 DIAGNOSIS — I480 Paroxysmal atrial fibrillation: Secondary | ICD-10-CM | POA: Diagnosis not present

## 2023-11-15 NOTE — Patient Instructions (Signed)
 Medication Instructions:  Your physician recommends that you continue on your current medications as directed. Please refer to the Current Medication list given to you today.   Please take your blood pressure twice daily for 1 week and send in a MyChart message. Please include heart rates. (One message at the end of the week).   HOW TO TAKE YOUR BLOOD PRESSURE: Rest 5 minutes before taking your blood pressure. Don't smoke or drink caffeinated beverages for at least 30 minutes before. Take your blood pressure before (not after) you eat. Sit comfortably with your back supported and both feet on the floor (don't cross your legs). Elevate your arm to heart level on a table or a desk. Use the proper sized cuff. It should fit smoothly and snugly around your bare upper arm. There should be enough room to slip a fingertip under the cuff. The bottom edge of the cuff should be 1 inch above the crease of the elbow. Ideally, take 3 measurements at one sitting and record the average.  *If you need a refill on your cardiac medications before your next appointment, please call your pharmacy*    Follow-Up: At [facility], you and your health needs are our priority.  As part of our continuing mission to provide you with exceptional heart care, our providers are all part of one team.  This team includes your primary Cardiologist (physician) and Advanced Practice Providers or APPs (Physician Assistants and Nurse Practitioners) who all work together to provide you with the care you need, when you need it.  Your next appointment:   6 month(s)  Provider:   Kardie Tobb, DO

## 2023-11-18 ENCOUNTER — Encounter: Payer: Self-pay | Admitting: Cardiology

## 2023-11-19 NOTE — Progress Notes (Signed)
 Cardiology Office Note:    Date:  11/19/2023   ID:  Thomas Knight, DOB [birth year], MRN [id number]  PCP:  Russell Court, DO  Cardiologist:  Gordy Goar, DO  Electrophysiologist:  Lei Pump, MD   Referring MD: Russell Court, DO   " I am really tired"  History of Present Illness:    Thomas Knight is a 78 y.o. male with a hx of minimal coronary artery disease seen on coronary CTA, paroxysmal atrial fibrillation on Eliquis  twice daily and metoprolol  25 mg twice a day, he is status post cardioversion, hypertension, hyperlipidemia, morbid obesity, colon cancer status post chemotherapy and radiation currently in remission.  Since his last visit he has been doing well. He is looking forward to his upcoming afib ablation.    Past Medical History:  Diagnosis Date   Bilateral leg edema 08/24/2019   Cancer (HCC)    skin ca, colon   Chronic cough    Chronic pain of left knee 12/01/2015   COVID-19    06/2019   Dysrhythmia    afib  was cardioverted   Essential hypertension 08/24/2019   Mixed hyperlipidemia 08/24/2019   Obesity (BMI 30-39.9) 08/24/2019   Persistent atrial fibrillation (HCC) 08/24/2019    Past Surgical History:  Procedure Laterality Date   ACHILLES TENDON REPAIR Left 2012   Ruptured   BASAL CELL CARCINOMA EXCISION  01/2023   right nose, back   BRONCHIAL BIOPSY  02/09/2022   Procedure: BRONCHIAL BIOPSIES;  Surgeon: Prudy Brownie, DO;  Location: [facility];  Service: Pulmonary;;   BRONCHIAL NEEDLE ASPIRATION BIOPSY  02/09/2022   Procedure: BRONCHIAL NEEDLE ASPIRATION BIOPSIES;  Surgeon: Prudy Brownie, DO;  Location: [facility];  Service: Pulmonary;;   COLONOSCOPY WITH PROPOFOL  N/A 12/11/2021   Procedure: COLONOSCOPY WITH PROPOFOL ;  Surgeon: Alvis Jourdain, MD;  Location: [facility];  Service: Gastroenterology;  Laterality: N/A;   HERNIA REPAIR     umbilical hernia   IR IMAGING GUIDED PORT INSERTION  02/18/2022   SKIN SURGERY Left     Basal cell carcinoma on left forearm   XI ROBOTIC ASSISTED LOWER ANTERIOR RESECTION N/A 01/09/2021   Procedure: XI ROBOTIC ASSISTED RIGHT HEMI COLECTOMY, LYSIS OF ADHESIONS, SMALL BOWEL RESECTION, INTRAOPERATIVE ASSESMENT OF PERFUSION, PARTIAL OMENTECTOMY;  Surgeon: Melvenia Stabs, MD;  Location: [facility];  Service: General;  Laterality: N/A;    Current Medications: Current Meds  Medication Sig   acetaminophen  (TYLENOL ) 500 MG tablet Take 500-1,000 mg by mouth every 6 (six) hours as needed (pain.).   amiodarone  (PACERONE ) 200 MG tablet Take 200 mg by mouth daily.   Ascorbic Acid  (VITAMIN C WITH ROSE HIPS) 500 MG tablet Take 500 mg by mouth every evening.   Cholecalciferol  (D3-1000) 25 MCG (1000 UT) capsule Take 1,000 Units by mouth every evening.   diphenoxylate -atropine  (LOMOTIL ) 2.5-0.025 MG tablet Take 1 tablet by mouth 4 (four) times daily as needed for diarrhea or loose stools.   ELIQUIS  5 MG TABS tablet Take 1 tablet (5 mg total) by mouth 2 (two) times daily.   Misc Natural Products (JOINT SUPPORT PO) Take 1 tablet by mouth daily. Instaflex Advanced Joint Support   potassium chloride  SA (KLOR-CON  M) 20 MEQ tablet Take 1 tablet (20 mEq total) by mouth daily.   sacubitril -valsartan  (ENTRESTO ) 97-103 MG Take 1 tablet by mouth 2 (two) times daily.   vitamin B-12 (CYANOCOBALAMIN ) 1000 MCG tablet Take 1,000 mcg by mouth every evening.   Zinc 50 MG CAPS Take  50 mg by mouth every evening.     Allergies:   Furosemide  and Tramadol    Social History   Socioeconomic History   Marital status: Married    Spouse name: Not on file   Number of children: Not on file   Years of education: Not on file   Highest education level: Not on file  Occupational History   Not on file  Tobacco Use   Smoking status: Never   Smokeless tobacco: Never  Vaping Use   Vaping status: Never Used  Substance and Sexual Activity   Alcohol use: Never   Drug use: Never   Sexual activity: Not Currently  Other  Topics Concern   Not on file  Social History Narrative   Not on file   Social Drivers of Health   Financial Resource Strain: Not on file  Food Insecurity: No Food Insecurity (03/15/2023)   Hunger Vital Sign    Worried About Running Out of Food in the Last Year: Never true    Ran Out of Food in the Last Year: Never true  Transportation Needs: No Transportation Needs (03/15/2023)   PRAPARE - Administrator, Civil Service (Medical): No    Lack of Transportation (Non-Medical): No  Physical Activity: Not on file  Stress: Not on file  Social Connections: Not on file     Family History: The patient's family history includes Lung cancer in his father.  ROS:   Review of Systems  Constitution: Reports fatigue.  Negative for decreased appetite, fever and weight gain.  HENT: Negative for congestion, ear discharge, hoarse voice and sore throat.   Eyes: Negative for discharge, redness, vision loss in right eye and visual halos.  Cardiovascular: Negative for chest pain, dyspnea on exertion, leg swelling, orthopnea and palpitations.  Respiratory: Negative for cough, hemoptysis, shortness of breath and snoring.   Endocrine: Negative for heat intolerance and polyphagia.  Hematologic/Lymphatic: Negative for bleeding problem. Does not bruise/bleed easily.  Skin: Negative for flushing, nail changes, rash and suspicious lesions.  Musculoskeletal: Negative for arthritis, joint pain, muscle cramps, myalgias, neck pain and stiffness.  Gastrointestinal: Negative for abdominal pain, bowel incontinence, diarrhea and excessive appetite.  Genitourinary: Negative for decreased libido, genital sores and incomplete emptying.  Neurological: Negative for brief paralysis, focal weakness, headaches and loss of balance.  Psychiatric/Behavioral: Negative for altered mental status, depression and suicidal ideas.  Allergic/Immunologic: Negative for HIV exposure and persistent infections.    EKGs/Labs/Other  Studies Reviewed:    The following studies were reviewed today:   EKG:  The ekg ordered today demonstrates atrial fibrillation with controlled ventricular rate  Recent Labs: 07/06/2023: Magnesium 2.1; TSH 5.330 10/27/2023: ALT 24; BUN 14; Creatinine, Ser 1.13; Hemoglobin 15.4; Platelets 156; Potassium 3.8; Sodium 141  Recent Lipid Panel No results found for: "CHOL", "TRIG", "HDL", "CHOLHDL", "VLDL", "LDLCALC", "LDLDIRECT"  Physical Exam:    VS:  BP (!) 158/88   Pulse (!) 55   Ht 5\' 11"  (1.803 m)   Wt 245 lb (111.1 kg)   SpO2 94%   BMI 34.17 kg/m     Wt Readings from Last 3 Encounters:  11/15/23 245 lb (111.1 kg)  11/03/23 246 lb (111.6 kg)  08/23/23 243 lb 12.8 oz (110.6 kg)     GEN: Well nourished, well developed in no acute distress HEENT: Normal NECK: No JVD; No carotid bruits LYMPHATICS: No lymphadenopathy CARDIAC: S1S2 noted,RRR, no murmurs, rubs, gallops RESPIRATORY:  Clear to auscultation without rales, wheezing or rhonchi  ABDOMEN:  Soft, non-tender, non-distended, +bowel sounds, no guarding. EXTREMITIES: No edema, No cyanosis, no clubbing MUSCULOSKELETAL:  No deformity  SKIN: Warm and dry NEUROLOGIC:  Alert and oriented x 3, non-focal PSYCHIATRIC:  Normal affect, good insight  ASSESSMENT:    1. PAF (paroxysmal atrial fibrillation) (HCC)   2. Chronic diastolic heart failure (HCC)   3. Mild CAD   4. Morbid obesity (HCC)      PLAN:    Atrial Fibrillation - he is schedule for ablation on May 29.   -Continue Amiodarone  and carvedilol .  Continue Eliquis  for stroke prevention   Hypertension - Blood pressure elevated at today's visit, but he reports that he is controlled at home. I will not change his medications today. He will send me update home blood pressue in 1 week.  Dilated cardiomyopathy/chronic diastolic heart failure-with recovered ejection fraction.  Continue current medication regimen.  Clinically euvolemic  CAD-no anginal symptoms  OSA continue  current management    Follow-up in 6 months to reassess rhythm status and blood pressure control.  The patient is in agreement with the above plan. The patient left the office in stable condition.  The patient will follow up in   Medication Adjustments/Labs and Tests Ordered: Current medicines are reviewed at length with the patient today.  Concerns regarding medicines are outlined above.  No orders of the defined types were placed in this encounter.  No orders of the defined types were placed in this encounter.   Patient Instructions  Medication Instructions:  Your physician recommends that you continue on your current medications as directed. Please refer to the Current Medication list given to you today.   Please take your blood pressure twice daily for 1 week and send in a MyChart message. Please include heart rates. (One message at the end of the week).   HOW TO TAKE YOUR BLOOD PRESSURE: Rest 5 minutes before taking your blood pressure. Don't smoke or drink caffeinated beverages for at least 30 minutes before. Take your blood pressure before (not after) you eat. Sit comfortably with your back supported and both feet on the floor (don't cross your legs). Elevate your arm to heart level on a table or a desk. Use the proper sized cuff. It should fit smoothly and snugly around your bare upper arm. There should be enough room to slip a fingertip under the cuff. The bottom edge of the cuff should be 1 inch above the crease of the elbow. Ideally, take 3 measurements at one sitting and record the average.  *If you need a refill on your cardiac medications before your next appointment, please call your pharmacy*    Follow-Up: At [facility], you and your health needs are our priority.  As part of our continuing mission to provide you with exceptional heart care, our providers are all part of one team.  This team includes your primary Cardiologist (physician) and Advanced  Practice Providers or APPs (Physician Assistants and Nurse Practitioners) who all work together to provide you with the care you need, when you need it.  Your next appointment:   6 month(s)  Provider:   Chandy Tarman, DO           Adopting a Healthy Lifestyle.  Know what a healthy weight is for you (roughly BMI <25) and aim to maintain this   Aim for 7+ servings of fruits and vegetables daily   65-80+ fluid ounces of water or unsweet tea for healthy kidneys   Limit to max 1  drink of alcohol per day; avoid smoking/tobacco   Limit animal fats in diet for cholesterol and heart health - choose grass fed whenever available   Avoid highly processed foods, and foods high in saturated/trans fats   Aim for low stress - take time to unwind and care for your mental health   Aim for 150 min of moderate intensity exercise weekly for heart health, and weights twice weekly for bone health   Aim for 7-9 hours of sleep daily   When it comes to diets, agreement about the perfect plan isnt easy to find, even among the experts. Experts at the [facility] [employer] developed an idea known as the Healthy Eating Plate. Just imagine a plate divided into logical, healthy portions.   The emphasis is on diet quality:   Load up on vegetables and fruits - one-half of your plate: Aim for color and variety, and remember that potatoes dont count.   Go for whole grains - one-quarter of your plate: Whole wheat, barley, wheat berries, quinoa, oats, brown rice, and foods made with them. If you want pasta, go with whole wheat pasta.   Protein power - one-quarter of your plate: Fish, chicken, beans, and nuts are all healthy, versatile protein sources. Limit red meat.   The diet, however, does go beyond the plate, offering a few other suggestions.   Use healthy plant oils, such as olive, canola, soy, corn, sunflower and peanut. Check the labels, and avoid partially hydrogenated oil, which have  unhealthy trans fats.   If youre thirsty, drink water. Coffee and tea are good in moderation, but skip sugary drinks and limit milk and dairy products to one or two daily servings.   The type of carbohydrate in the diet is more important than the amount. Some sources of carbohydrates, such as vegetables, fruits, whole grains, and beans-are healthier than others.   Finally, stay active  Signed, Shernell Saldierna, DO  11/19/2023 9:00 AM    [facility]

## 2023-11-22 ENCOUNTER — Other Ambulatory Visit: Payer: Self-pay

## 2023-11-22 MED ORDER — AMLODIPINE BESYLATE 5 MG PO TABS
5.0000 mg | ORAL_TABLET | Freq: Every day | ORAL | 3 refills | Status: DC
Start: 2023-11-22 — End: 2024-02-21

## 2023-11-22 NOTE — Progress Notes (Signed)
 Amlodipine  5 mg once daily sent to pt's preferred pharmacy.

## 2023-11-23 NOTE — Pre-Procedure Instructions (Signed)
 Attempted to call patient regarding procedure instructions.  Left voicemail on the following items: Arrival time 0730 Nothing to eat or drink after midnight No meds AM of procedure Responsible person to drive you home and stay with you for 24 hrs  Have you missed any doses of anti-coagulant Eliquis- should be taken twice a day, if you have missed any doses please let us know.  Don't take dose morning of procedure.

## 2023-11-24 ENCOUNTER — Encounter: Payer: Self-pay | Admitting: Emergency Medicine

## 2023-11-24 ENCOUNTER — Other Ambulatory Visit: Payer: Self-pay

## 2023-11-24 ENCOUNTER — Ambulatory Visit (HOSPITAL_COMMUNITY): Admitting: Registered Nurse

## 2023-11-24 ENCOUNTER — Encounter (HOSPITAL_COMMUNITY)
Admission: RE | Disposition: A | Discharge status: Home / Self Care | Payer: Self-pay | Source: Home / Self Care | Attending: Cardiology

## 2023-11-24 ENCOUNTER — Ambulatory Visit (HOSPITAL_COMMUNITY)
Admission: RE | Admit: 2023-11-24 | Discharge: 2023-11-24 | Disposition: A | Discharge status: Home / Self Care | Attending: Cardiology | Admitting: Cardiology

## 2023-11-24 ENCOUNTER — Encounter (HOSPITAL_COMMUNITY): Payer: Self-pay | Admitting: Cardiology

## 2023-11-24 DIAGNOSIS — I5032 Chronic diastolic (congestive) heart failure: Secondary | ICD-10-CM | POA: Diagnosis not present

## 2023-11-24 DIAGNOSIS — I4891 Unspecified atrial fibrillation: Secondary | ICD-10-CM | POA: Diagnosis not present

## 2023-11-24 DIAGNOSIS — I483 Typical atrial flutter: Secondary | ICD-10-CM | POA: Diagnosis not present

## 2023-11-24 DIAGNOSIS — G4730 Sleep apnea, unspecified: Secondary | ICD-10-CM | POA: Diagnosis not present

## 2023-11-24 DIAGNOSIS — I4819 Other persistent atrial fibrillation: Secondary | ICD-10-CM | POA: Insufficient documentation

## 2023-11-24 DIAGNOSIS — I2510 Atherosclerotic heart disease of native coronary artery without angina pectoris: Secondary | ICD-10-CM | POA: Diagnosis not present

## 2023-11-24 DIAGNOSIS — I110 Hypertensive heart disease with heart failure: Secondary | ICD-10-CM | POA: Diagnosis not present

## 2023-11-24 DIAGNOSIS — I10 Essential (primary) hypertension: Secondary | ICD-10-CM | POA: Diagnosis not present

## 2023-11-24 HISTORY — PX: ATRIAL FIBRILLATION ABLATION: EP1191

## 2023-11-24 LAB — POCT ACTIVATED CLOTTING TIME: Activated Clotting Time: 348 s

## 2023-11-24 SURGERY — ATRIAL FIBRILLATION ABLATION
Anesthesia: General

## 2023-11-24 MED ORDER — LIDOCAINE 2% (20 MG/ML) 5 ML SYRINGE
INTRAMUSCULAR | Status: DC | PRN
  Administered 2023-11-24: 100 mg via INTRAVENOUS

## 2023-11-24 MED ORDER — HEPARIN (PORCINE) IN NACL 1000-0.9 UT/500ML-% IV SOLN
INTRAVENOUS | Status: DC | PRN
  Administered 2023-11-24 (×3): 500 mL

## 2023-11-24 MED ORDER — FENTANYL CITRATE (PF) 100 MCG/2ML IJ SOLN
INTRAMUSCULAR | Status: AC
  Filled 2023-11-24: qty 2

## 2023-11-24 MED ORDER — ATROPINE SULFATE 1 MG/ML IV SOLN
INTRAVENOUS | Status: DC | PRN
  Administered 2023-11-24: 1 mg via INTRAVENOUS

## 2023-11-24 MED ORDER — ACETAMINOPHEN 325 MG PO TABS: 650.0000 mg | ORAL_TABLET | ORAL | Status: DC | PRN

## 2023-11-24 MED ORDER — ROCURONIUM BROMIDE 10 MG/ML (PF) SYRINGE
PREFILLED_SYRINGE | INTRAVENOUS | Status: DC | PRN
  Administered 2023-11-24: 60 mg via INTRAVENOUS
  Administered 2023-11-24: 10 mg via INTRAVENOUS

## 2023-11-24 MED ORDER — SODIUM CHLORIDE 0.9 % IV SOLN: INTRAVENOUS | Status: DC

## 2023-11-24 MED ORDER — PHENYLEPHRINE HCL-NACL 20-0.9 MG/250ML-% IV SOLN
INTRAVENOUS | Status: DC | PRN
  Administered 2023-11-24: 20 ug/min via INTRAVENOUS

## 2023-11-24 MED ORDER — FENTANYL CITRATE (PF) 250 MCG/5ML IJ SOLN
INTRAMUSCULAR | Status: DC | PRN
  Administered 2023-11-24 (×2): 50 ug via INTRAVENOUS

## 2023-11-24 MED ORDER — SUGAMMADEX SODIUM 200 MG/2ML IV SOLN
INTRAVENOUS | Status: DC | PRN
  Administered 2023-11-24: 300 mg via INTRAVENOUS

## 2023-11-24 MED ORDER — ONDANSETRON HCL 4 MG/2ML IJ SOLN: 4.0000 mg | Freq: Four times a day (QID) | INTRAMUSCULAR | Status: DC | PRN

## 2023-11-24 MED ORDER — DEXAMETHASONE SODIUM PHOSPHATE 10 MG/ML IJ SOLN
INTRAMUSCULAR | Status: DC | PRN
  Administered 2023-11-24: 10 mg via INTRAVENOUS

## 2023-11-24 MED ORDER — PHENOL 1.4 % MT LIQD
1.0000 | OROMUCOSAL | Status: DC | PRN
  Administered 2023-11-24: 1 via OROMUCOSAL
  Filled 2023-11-24: qty 177

## 2023-11-24 MED ORDER — HEPARIN SODIUM (PORCINE) 1000 UNIT/ML IJ SOLN
INTRAMUSCULAR | Status: DC | PRN
  Administered 2023-11-24: 15000 [IU] via INTRAVENOUS

## 2023-11-24 MED ORDER — PROPOFOL 10 MG/ML IV BOLUS
INTRAVENOUS | Status: DC | PRN
Start: 2023-11-24 — End: 2023-11-24
  Administered 2023-11-24: 120 mg via INTRAVENOUS

## 2023-11-24 MED ORDER — SODIUM CHLORIDE 0.9 % IV SOLN: 250.0000 mL | INTRAVENOUS | Status: DC | PRN

## 2023-11-24 MED ORDER — ONDANSETRON HCL 4 MG/2ML IJ SOLN
INTRAMUSCULAR | Status: DC | PRN
  Administered 2023-11-24: 4 mg via INTRAVENOUS

## 2023-11-24 MED ORDER — PROTAMINE SULFATE 10 MG/ML IV SOLN
INTRAVENOUS | Status: DC | PRN
  Administered 2023-11-24: 40 mg via INTRAVENOUS

## 2023-11-24 SURGICAL SUPPLY — 19 items
BLANKET WARM UNDERBOD FULL ACC (MISCELLANEOUS) ×1 IMPLANT
CABLE PFA RX CATH CONN (CABLE) IMPLANT
CATH EZ STEER NAV 8MM D-F CUR (ABLATOR) IMPLANT
CATH FARAWAVE ABLATION 31 (CATHETERS) IMPLANT
CATH GE 8FR SOUNDSTAR (CATHETERS) IMPLANT
CATH OCTARAY 2.0 F 3-3-3-3-3 (CATHETERS) IMPLANT
CATH WEBSTER BI DIR CS D-F CRV (CATHETERS) IMPLANT
CLOSURE MYNX CONTROL 6F/7F (Vascular Products) IMPLANT
CLOSURE PERCLOSE PROSTYLE (VASCULAR PRODUCTS) IMPLANT
COVER SWIFTLINK CONNECTOR (BAG) ×1 IMPLANT
DILATOR VESSEL 38 20CM 16FR (INTRODUCER) IMPLANT
GUIDEWIRE INQWIRE 1.5J.035X260 (WIRE) IMPLANT
KIT VERSACROSS CNCT FARADRIVE (KITS) IMPLANT
PACK EP LF (CUSTOM PROCEDURE TRAY) ×1 IMPLANT
PAD DEFIB RADIO PHYSIO CONN (PAD) ×1 IMPLANT
PATCH CARTO3 (PAD) IMPLANT
SHEATH AVANTI 11CM 9FR (SHEATH) IMPLANT
SHEATH FARADRIVE STEERABLE (SHEATH) IMPLANT
SHEATH PINNACLE 8F 10CM (SHEATH) IMPLANT

## 2023-11-24 NOTE — Anesthesia Procedure Notes (Signed)
 Procedure Name: Intubation Date/Time: 11/24/2023 9:47 AM  Performed by: Jamas Maywood, CRNAPre-anesthesia Checklist: Patient identified, Emergency Drugs available, Suction available and Patient being monitored Patient Re-evaluated:Patient Re-evaluated prior to induction Oxygen Delivery Method: Circle system utilized Preoxygenation: Pre-oxygenation with 100% oxygen Induction Type: IV induction Ventilation: Mask ventilation without difficulty and Oral airway inserted - appropriate to patient size Laryngoscope Size: Mac and 4 Grade View: Grade I Tube type: Oral Tube size: 7.5 mm Number of attempts: 1 Airway Equipment and Method: Stylet and Oral airway Placement Confirmation: ETT inserted through vocal cords under direct vision, positive ETCO2 and breath sounds checked- equal and bilateral Secured at: 23 cm Tube secured with: Tape Dental Injury: Teeth and Oropharynx as per pre-operative assessment

## 2023-11-24 NOTE — Anesthesia Preprocedure Evaluation (Addendum)
 Anesthesia Evaluation  Patient identified by MRN, date of birth, ID band Patient awake    Reviewed: Allergy & Precautions, H&P , NPO status , Patient's Chart, lab work & pertinent test results  Airway Mallampati: II  TM Distance: >3 FB Neck ROM: Full    Dental no notable dental hx.    Pulmonary sleep apnea  Colon cancer with mets to lung   Pulmonary exam normal breath sounds clear to auscultation       Cardiovascular hypertension, + CAD  Normal cardiovascular exam+ dysrhythmias Atrial Fibrillation  Rhythm:Regular Rate:Normal  IMPRESSIONS     1. Left ventricular ejection fraction, by estimation, is 55 to 60%. The  left ventricle has normal function. The left ventricle has no regional  wall motion abnormalities. There is mild concentric left ventricular  hypertrophy. Left ventricular diastolic  parameters are indeterminate. The average left ventricular global  longitudinal strain is 16.1 %. The global longitudinal strain is abnormal.   2. Right ventricular systolic function is normal. The right ventricular  size is normal. There is normal pulmonary artery systolic pressure.   3. Left atrial size was mildly dilated.   4. The mitral valve is normal in structure. No evidence of mitral valve  regurgitation. No evidence of mitral stenosis.   5. The aortic valve is tricuspid. Aortic valve regurgitation is not  visualized. No aortic stenosis is present.   6. There is mild dilatation of the descending aorta, measuring 35 mm.   7. The inferior vena cava is normal in size with greater than 50%  respiratory variability, suggesting right atrial pressure of 3 mmHg.     Neuro/Psych negative neurological ROS  negative psych ROS   GI/Hepatic Neg liver ROS,,,Hx of colon cancer, s/p chemo and radiation. First diagnosed in 2022. Last chemotherapy 11/2022   Endo/Other  negative endocrine ROS    Renal/GU negative Renal ROS  negative  genitourinary   Musculoskeletal negative musculoskeletal ROS (+)    Abdominal   Peds negative pediatric ROS (+)  Hematology negative hematology ROS (+)   Anesthesia Other Findings   Reproductive/Obstetrics negative OB ROS                              Anesthesia Physical Anesthesia Plan  ASA: 3  Anesthesia Plan: General   Post-op Pain Management:    Induction: Intravenous  PONV Risk Score and Plan: 2 and Ondansetron , Dexamethasone  and Treatment may vary due to age or medical condition  Airway Management Planned: Oral ETT  Additional Equipment: Arterial line  Intra-op Plan:   Post-operative Plan: Extubation in OR  Informed Consent: I have reviewed the patients History and Physical, chart, labs and discussed the procedure including the risks, benefits and alternatives for the proposed anesthesia with the patient or authorized representative who has indicated his/her understanding and acceptance.     Dental advisory given  Plan Discussed with: CRNA  Anesthesia Plan Comments:        Anesthesia Quick Evaluation

## 2023-11-24 NOTE — Anesthesia Postprocedure Evaluation (Signed)
 Anesthesia Post Note  Patient: Denver M Longest  Procedure(s) Performed: ATRIAL FIBRILLATION ABLATION     Patient location during evaluation: PACU Anesthesia Type: General Level of consciousness: awake and alert Pain management: pain level controlled Vital Signs Assessment: post-procedure vital signs reviewed and stable Respiratory status: spontaneous breathing, nonlabored ventilation, respiratory function stable and patient connected to nasal cannula oxygen Cardiovascular status: blood pressure returned to baseline and stable Postop Assessment: no apparent nausea or vomiting Anesthetic complications: no   There were no known notable events for this encounter.  Last Vitals:  Vitals:   11/24/23 1130 11/24/23 1145  BP: 108/64   Pulse: (!) 59   Resp: 16   Temp:  36.9 C  SpO2: 91%     Last Pain:  Vitals:   11/24/23 1109  TempSrc: Oral   Pain Goal:                   Lethaniel Rave

## 2023-11-24 NOTE — Discharge Instructions (Signed)

## 2023-11-24 NOTE — Progress Notes (Signed)
 Patient arrived to cath lab holding area bay 20. Patient a/o x4. Denies any distress. Bilateral groin dressings are dry/intact with no bleeding or hematoma noted. Post activity and precautions explained; verbalized understanding. EKG obtained. Patient has a dry continuous dry cough. Lung sounds are clear. Cough appears to be irritation from intubation. Chloraseptic Spray order given per Mertha Abrahams, PA. Will continue to monitor per orders and protocol.Calleigh Lafontant E

## 2023-11-24 NOTE — Transfer of Care (Signed)
 Immediate Anesthesia Transfer of Care Note  Patient: Thomas Knight  Procedure(s) Performed: ATRIAL FIBRILLATION ABLATION  Patient Location: PACU  Anesthesia Type:General  Level of Consciousness: awake, alert , and oriented  Airway & Oxygen Therapy: Patient Spontanous Breathing  Post-op Assessment: Report given to RN and Post -op Vital signs reviewed and stable  Post vital signs: Reviewed and stable  Last Vitals:  Vitals Value Taken Time  BP 106/66 1110  Temp    Pulse 57 11/24/23 1109  Resp 17 11/24/23 1109  SpO2 96% 11/24/23 1109  Vitals shown include unfiled device data.  Last Pain:  Vitals:   11/24/23 0805  TempSrc: Oral         Complications: There were no known notable events for this encounter.

## 2023-11-24 NOTE — H&P (Signed)
  Electrophysiology Office Note:   Date:  11/24/2023  ID:  Thomas Knight, DOB [birth year], MRN [id number]  Primary Cardiologist: Kardie Tobb, DO Primary Heart Failure: None Electrophysiologist: Aubria Vanecek Cortland Ding, MD      History of Present Illness:   Thomas Knight is a 78 y.o. male with h/o minimal coronary artery disease on coronary CTA, atrial fibrillation/flutter, hypertension, hyperlipidemia, obesity, colon cancer post chemotherapy and radiation seen today for  for Electrophysiology evaluation of atrial fibrillation at the request of Kardie Tobb.    He wore a recent cardiac monitor that showed a 100% atrial fibrillation burden.  He has been fatigued and shortness of breath.  He has not had chest pain.  He is continue to be able to do most of his daily activities.  He does notice shortness of breath when carrying heavy objects up a hill.  He feels like his shortness of breath and fatigue is more since he has gone into atrial fibrillation.  He thinks he has been in atrial fibrillation since last summer.  Today, denies symptoms of palpitations, chest pain, shortness of breath, orthopnea, PND, lower extremity edema, claudication, dizziness, presyncope, syncope, bleeding, or neurologic sequela. The patient is tolerating medications without difficulties. Plan AF ablation today.   EP Information / Studies Reviewed:    EKG is ordered today. Personal review as below.        Risk Assessment/Calculations:    CHA2DS2-VASc Score = 4   This indicates a 4.8% annual risk of stroke. The patient's score is based upon: CHF History: 0 HTN History: 1 Diabetes History: 0 Stroke History: 0 Vascular Disease History: 1 Age Score: 2 Gender Score: 0            Physical Exam:   VS:  BP (!) 167/91   Pulse 61   Temp 97.8 F (36.6 C) (Oral)   Resp 20   Ht 5\' 11"  (1.803 m)   Wt 108.9 kg   SpO2 97%   BMI 33.47 kg/m    Wt Readings from Last 3 Encounters:  11/24/23 108.9 kg  11/15/23 111.1  kg  11/03/23 111.6 kg    GEN: No acute distress.   Neck: No JVD Cardiac: RRR, no murmurs, rubs, or gallops.  Respiratory: normal BS bilaterally. GI: Soft, nontender, non-distended  MS: No edema; No deformity. Neuro:  Nonfocal  Skin: warm and dry Psych: Normal affect    ASSESSMENT AND PLAN:    1.  Persistent atrial fibrillation/flutter: Thomas Knight has presented today for surgery, with the diagnosis of AF.  The various methods of treatment have been discussed with the patient and family. After consideration of risks, benefits and other options for treatment, the patient has consented to  Procedure(s): Catheter ablation as a surgical intervention .  Risks include but not limited to complete heart block, stroke, esophageal damage, nerve damage, bleeding, vascular damage, tamponade, perforation, MI, and death. The patient's history has been reviewed, patient examined, no change in status, stable for surgery.  I have reviewed the patient's chart and labs.  Questions were answered to the patient's satisfaction.    Thomas Mclelland Lawana Pray, MD 11/24/2023 9:17 AM

## 2023-11-25 ENCOUNTER — Other Ambulatory Visit: Payer: Self-pay

## 2023-11-25 ENCOUNTER — Telehealth (HOSPITAL_COMMUNITY): Payer: Self-pay

## 2023-11-25 MED FILL — Fentanyl Citrate Preservative Free (PF) Inj 100 MCG/2ML: INTRAMUSCULAR | Qty: 2 | Status: AC

## 2023-11-25 NOTE — Telephone Encounter (Signed)
 Spoke with patient to complete post procedure follow up call.  Patient reports no complications with groin sites.   Instructions reviewed with patient:  Remove large bandage at puncture site after 24 hours. It is normal to have bruising, tenderness, mild swelling, and a pea or marble sized lump/knot at the groin site which can take up to three months to resolve.  Get help right away if you notice sudden swelling at the puncture site.  Check your puncture site every day for signs of infection: fever, redness, swelling, pus drainage, warmth, foul odor or excessive pain. If this occurs, please call the office at [phone], to speak with the nurse. Get help right away if your puncture site is bleeding and the bleeding does not stop after applying firm pressure to the area.  You may continue to have skipped beats/ atrial fibrillation during the first several months after your procedure.  It is very important not to miss any doses of your blood thinner Eliquis . Patient restarted taking this medication on 11/24/23.   You will follow up with the Afib clinic on 12/22/23 and follow up with the APP on 02/28/24.   Patient verbalized understanding to all instructions provided.

## 2023-11-28 ENCOUNTER — Telehealth: Payer: Self-pay | Admitting: Hematology

## 2023-11-29 ENCOUNTER — Inpatient Hospital Stay: Attending: Physician Assistant | Admitting: Hematology

## 2023-11-29 DIAGNOSIS — C182 Malignant neoplasm of ascending colon: Secondary | ICD-10-CM | POA: Insufficient documentation

## 2023-11-29 DIAGNOSIS — C189 Malignant neoplasm of colon, unspecified: Secondary | ICD-10-CM | POA: Diagnosis not present

## 2023-11-29 DIAGNOSIS — C7800 Secondary malignant neoplasm of unspecified lung: Secondary | ICD-10-CM | POA: Diagnosis not present

## 2023-11-29 NOTE — Assessment & Plan Note (Signed)
-  stage II (pT3, N0), metastatic disease to lung and possible nodes in July 2023, MSS, KRAS/NRAS/BRAF wild type   -found on screening colonoscopy. S/p right hemicolectomy 01/09/21. -lung metastasis confirmed in [facility] by bronchoscopy on 02/09/22. -[facility] showed MSS, low tumor burden, KRAS/NRAS/BRAF wild-type, no other targetable mutations. The benefit of EGFR inhibitor is much less in right-sided colon cancer, so I will not give it as first line but may consider in subsequent lines.  -he started FOLFOX on 03/03/22. He tolerated well overall with diarrhea for several days. Bevacizumab added with C2 (9/20).  -CT scan 05/26/2022 showed improved lung mets, but showed new infiltrative lung change, possible related to his recent URI -repeated CT chest on 07/26/2022 showed stable known lung mets (overall excellent response, with minimum residual dsease) and resolved previously see inflammatory change in RLL -I have changed treatment to maintenance xeloda and beva every 3 weeks, he started C1 on 08/18/2022.  -due to recurrent diarrhea, I stopped Xeloda after 2 cycles -we have changed his treatment to 5-fu pump infusion and beva every 2 weeks on 4/24, he tolerated reasonably well, diarrhea has resolved now. -Restaging CT scan yesterday showed stable appearance of previously scattered small pulmonary nodules, measuring 4-75mm, no new lesions.  Overall has much improved compared to his initial CT scan from July 2023, and it is a hard to tell if this is residual disease versus scar tissue.  -We stopped his chemo after last scan and he is on cancer surveillance now. -Personally reviewed his follow-up CT scan from February 14, 2023, which showed slightly increase the size of the 2 nodule in the right lung, most consistent with disease progression.  No other new metastasis. -He completed SBRT on 04/11/2023

## 2023-11-29 NOTE — Progress Notes (Signed)
 [facility]   Telephone:([phone] Fax:(336) [phone]   Clinic Follow up Note   Patient Care Team: Russell Court, DO as PCP - General (Family Medicine) Tobb, Kardie, DO as PCP - Cardiology (Cardiology) Lei Pump, MD as PCP - Electrophysiology (Cardiology) Sonja Franklin, MD as Attending Physician (Hematology and Oncology) 11/29/2023  I connected with Thomas Knight on 11/29/23 at  3:45 PM EDT by telephone and verified that I am speaking with the correct person using two identifiers.   I discussed the limitations, risks, security and privacy concerns of performing an evaluation and management service by telephone and the availability of in person appointments. I also discussed with the patient that there may be a patient responsible charge related to this service. The patient expressed understanding and agreed to proceed.   Patient's location: Home Provider's location:  Office    CHIEF COMPLAINT: Follow-up of metastatic colon cancer   CURRENT THERAPY:   Oncology history Cancer of right colon (HCC) -stage II (pT3, N0), metastatic disease to lung and possible nodes in July 2023, MSS, KRAS/NRAS/BRAF wild type   -found on screening colonoscopy. S/p right hemicolectomy 01/09/21. -lung metastasis confirmed in RUL by bronchoscopy on 02/09/22. -[employer] showed MSS, low tumor burden, KRAS/NRAS/BRAF wild-type, no other targetable mutations. The benefit of EGFR inhibitor is much less in right-sided colon cancer, so I will not give it as first line but may consider in subsequent lines.  -he started FOLFOX on 03/03/22. He tolerated well overall with diarrhea for several days. Bevacizumab  added with C2 (9/20).  -CT scan 05/26/2022 showed improved lung mets, but showed new infiltrative lung change, possible related to his recent URI -repeated CT chest on 07/26/2022 showed stable known lung mets (overall excellent response, with minimum residual dsease) and resolved  previously see inflammatory change in RLL -I have changed treatment to maintenance xeloda  and beva every 3 weeks, he started C1 on 08/18/2022.  -due to recurrent diarrhea, I stopped Xeloda  after 2 cycles -we have changed his treatment to 5-fu pump infusion and beva every 2 weeks on 4/24, he tolerated reasonably well, diarrhea has resolved now. -Restaging CT scan yesterday showed stable appearance of previously scattered small pulmonary nodules, measuring 4-13mm, no new lesions.  Overall has much improved compared to his initial CT scan from July 2023, and it is a hard to tell if this is residual disease versus scar tissue.  -We stopped his chemo after last scan and he is on cancer surveillance now. -Personally reviewed his follow-up CT scan from February 14, 2023, which showed slightly increase the size of the 2 nodule in the right lung, most consistent with disease progression.  No other new metastasis. -He completed SBRT on 04/11/2023   Assessment & Plan Metastatic colon cancer with lung nodules  Metastatic colon cancer with a new 0.9 cm nodule in the lower lobe of the left lung. Previous radiation to the upper lobe of the right lung with no solid tumor visible, but radiation changes present. Guardian test on May 8th was negative, indicating no detectable tumor DNA in the blood, suggesting a 80-90% chance of no active cancer.  - Order CT scan in the first week of July to monitor lung nodules. -The procedure by Dr. Sidney Drain is not recommended currently due to recent cardiac surgery and anticoagulation therapy, which increases bleeding risk. Radiation by Dr. Jeryl Moris is considered safer if the nodule grows. - Schedule follow-up appointment in the second week of July to review CT scan results. -  If the left lung nodule shows growth, consider referral to Dr. Jeryl Moris for radiation treatment. - Monitor the 0.3 cm nodule in the middle lobe of the right lung and the previously radiated area in the upper lobe of the  right lung for changes.  Atrial fibrillation Recent cardiac ablation procedure performed last Thursday with successful return to normal rhythm. Currently on Eliquis , carvedilol , amlodipine , and Entresto  for cardiac management. No current symptoms reported, and he feels well. - Continue current cardiac medications: Eliquis , carvedilol , amlodipine , and Entresto .  Plan - Will repeat a lab and a CT chest without contrast in 1 month - Follow-up 1 week after CT and lab   SUMMARY OF ONCOLOGIC HISTORY: Oncology History Overview Note   Cancer Staging  Cancer of right colon [facility]) Staging form: Colon and Rectum, AJCC 8th Edition - Pathologic stage from 01/09/2021: Stage IIA (pT3, pN0, cM0) - Signed by Sonja Marshall, MD on 02/12/2022 Total positive nodes: 0 Histologic grading system: 4 grade system Histologic grade (G): G2 Residual tumor (R): R0 - None     Cancer of right colon (HCC)  10/2020 Procedure   Colonoscopy-Dr. Nickey Barn     11/06/2020 Imaging   CT CHEST ABDOMEN PELVIS W CONTRAST   IMPRESSION: 1. 4.6 cm partially circumferential apple-core type neoplastic process involving the mid transverse colon. No findings for locoregional adenopathy or distant metastatic disease. 2. Enlarged prostate gland with median lobe hypertrophy impressing on the base of the bladder. 3. 16 mm left thyroid  nodule. Recommend follow-up thyroid  ultrasound examination.   01/09/2021 Initial Diagnosis   Colon cancer (HCC)   01/09/2021 Pathology Results   B. COLON, RIGHT, RESECTION:  -  Adenocarcinoma, moderately differentiated, 3.0 cm  -  No carcinoma identified in seventeen lymph nodes (0/17)  -  Margins uninvolved by carcinoma  -  Tubular adenoma (x2)  -  Benign appendix  -  See oncology table and comment below   Margin Status for Invasive Carcinoma: All margins negative for  invasive carcinoma       Distance from Invasive Carcinoma to Radial (Circumferential)       Margin Status for Non-Invasive Tumor:  All margins negative for  high-grade dysplasia / intramucosal carcinoma and low-grade dysplasia  Regional Lymph Nodes:       Number of Lymph Nodes with Tumor: 0       Number of Lymph Nodes Examined: 17  Tumor Deposits: 0  Distant Metastasis:       Distant Site(s) Involved: Not applicable  Pathologic Stage Classification (pTNM, AJCC 8th Edition): pT3, pN0   MMR Stable    01/09/2021 Cancer Staging   Staging form: Colon and Rectum, AJCC 8th Edition - Pathologic stage from 01/09/2021: Stage IIA (pT3, pN0, cM0) - Signed by Sonja [place], MD on 02/12/2022 Total positive nodes: 0 Histologic grading system: 4 grade system Histologic grade (G): G2 Residual tumor (R): R0 - None   05/2021 Tumor Marker   Patient's tumor was tested for the following markers: CEA. Results of the tumor marker test revealed 2.1.   12/11/2021 Procedure   Colonoscopy-Dr. Nickey Barn  There was evidence of a prior functional end-to-end ileo-colonic anastomosis in the transverse colon. This was patent and was characterized by healthy appearing mucosa. The anastomosis was traversed. Findings: Scattered small and large-mouthed diverticula were found in the sigmoid colon. - Patent functional end-to-end ileo-colonic anastomosis, characterized by healthy appearing mucosa. - Diverticulosis in the sigmoid colon. - No specimens collected.   01/25/2022 Imaging   CT CHEST ABDOMEN PELVIS W  CONTRAST   IMPRESSION: 1. New right-sided pulmonary nodules measure up to 11 mm, nonspecific but concerning for metastatic disease. Consider further evaluation with nuclear medicine PET/CT. 2. New nodularity/lymph nodes in the central mesentery, concerning for nodal disease involvement. 3. Slightly increased size of prominent retroperitoneal lymph nodes, nonspecific but also somewhat concerning for nodal disease involvement. 4. Surgical change of partial colectomy without evidence of local recurrence. 5. Stable prominent mediastinal lymph  nodes, nonspecific but stability is reassuring. Attention on follow-up imaging suggested. 6. Stable prominent right external iliac lymph node, nonspecific but stability is reassuring. Attention on follow-up imaging suggested. 7. Stable hypodense 8 mm hepatic lesion technically too small to accurately characterize but stability is reassuring. Attention on follow-up imaging suggested. 8. 1.9 cm incidental left thyroid  nodule. Recommend thyroid  US . Reference: J Am Coll Radiol. 2015 Feb;12(2): 143-50 9.  Aortic Atherosclerosis (ICD10-I70.0).   02/09/2022 Procedure   Bronchoscopy under the care of Dr. Thelda Finney    02/09/2022 Pathology Results   FINAL MICROSCOPIC DIAGNOSIS:   A. LUNG, RUL, FINE NEEDLE ASPIRATION  BIOPSY FORCEPS:  Adenocarcinoma with morphologic features consistent with metastasis from a colorectal primary.  Please see comment.   Comment: The following immunostains are performed with appropriate controls :  TTF-1: Negative .  Napsin A: Negative .  CK7: Negative.  CK20: Positive.  CDX2: Positive .   The above immuno histochemical profile is supportive of the rendered diagnosis.    08/18/2022 - 08/18/2022 Chemotherapy   Patient is on Treatment Plan : COLORECTAL Bevacizumab  q14d     Colon cancer metastasized to lung [facility])  02/09/2022 Pathology Results   FINAL MICROSCOPIC DIAGNOSIS:   A. LUNG, RUL, FINE NEEDLE ASPIRATION  BIOPSY FORCEPS:  Adenocarcinoma with morphologic features consistent with metastasis from a colorectal primary.  Please see comment.   Comment: The following immunostains are performed with appropriate controls :  TTF-1: Negative .  Napsin A: Negative .  CK7: Negative.  CK20: Positive.  CDX2: Positive .   The above immuno histochemical profile is supportive of the rendered diagnosis.    02/12/2022 Initial Diagnosis   Colon cancer metastasized to lung (HCC)   03/03/2022 - 07/31/2022 Chemotherapy   Patient is on Treatment Plan : COLORECTAL FOLFOX +  Bevacizumab  q14d     07/26/2022 Imaging    IMPRESSION: 1. Near-complete interval resolution of nodular and masslike areas of irregular consolidation, likely resolving infection or organizing pneumonia/drug reaction. 2. Additional previously seen pulmonary nodules are stable, consistent with treated pulmonary metastatic disease. 3. Mild peribronchovascular reticular opacities which are most pronounced in the mid and lower lungs, similar to prior exam, progressed when compared with prior dated January 15, 2022. Findings can be seen in the setting of interstitial lung disease, pattern is most consistent with fibrotic NSIP. 4. Aortic Atherosclerosis (ICD10-I70.0).   07/26/2022 Imaging    IMPRESSION: 1. Near-complete interval resolution of nodular and masslike areas of irregular consolidation, likely resolving infection or organizing pneumonia/drug reaction. 2. Additional previously seen pulmonary nodules are stable, consistent with treated pulmonary metastatic disease. 3. Mild peribronchovascular reticular opacities which are most pronounced in the mid and lower lungs, similar to prior exam, progressed when compared with prior dated January 15, 2022. Findings can be seen in the setting of interstitial lung disease, pattern is most consistent with fibrotic NSIP. 4. Aortic Atherosclerosis (ICD10-I70.0).   09/08/2022 - 09/08/2022 Chemotherapy   Patient is on Treatment Plan : COLORECTAL Bevacizumab  q21d     10/20/2022 -  Chemotherapy   Patient is on  Treatment Plan : COLORECTAL 5FU + Leucovorin  (Modified DeGramont) + Bevacizumab  q14d       Discussed the use of AI scribe software for clinical note transcription with the patient, who gave verbal consent to proceed.  History of Present Illness Thomas Knight is a 78 year old male with metastatic colon cancer who presents for a follow-up visit. He is accompanied by Marthenia Slater, his wife.  He has metastatic colon cancer with a new 0.9 cm nodule in the  left lung and a 0.3 cm nodule in the middle lobe of the right lung. A tumor in the upper lobe of the right lung was previously treated with radiation. A recent Guardant test on May 8th shows no detectable tumor DNA in the blood. He feels good and has a good appetite.     REVIEW OF SYSTEMS:   Constitutional: Denies fevers, chills or abnormal weight loss Eyes: Denies blurriness of vision Ears, nose, mouth, throat, and face: Denies mucositis or sore throat Respiratory: Denies cough, dyspnea or wheezes Cardiovascular: Denies palpitation, chest discomfort or lower extremity swelling Gastrointestinal:  Denies nausea, heartburn or change in bowel habits Skin: Denies abnormal skin rashes Lymphatics: Denies new lymphadenopathy or easy bruising Neurological:Denies numbness, tingling or new weaknesses Behavioral/Psych: Mood is stable, no new changes  All other systems were reviewed with the patient and are negative.  MEDICAL HISTORY:  Past Medical History:  Diagnosis Date   Bilateral leg edema 08/24/2019   Cancer (HCC)    skin ca, colon   Chronic cough    Chronic pain of left knee 12/01/2015   COVID-19    06/2019   Dysrhythmia    afib  was cardioverted   Essential hypertension 08/24/2019   Mixed hyperlipidemia 08/24/2019   Obesity (BMI 30-39.9) 08/24/2019   Persistent atrial fibrillation (HCC) 08/24/2019    SURGICAL HISTORY: Past Surgical History:  Procedure Laterality Date   ACHILLES TENDON REPAIR Left 2012   Ruptured   ATRIAL FIBRILLATION ABLATION N/A 11/24/2023   Procedure: ATRIAL FIBRILLATION ABLATION;  Surgeon: Lei Pump, MD;  Location: [facility];  Service: Cardiovascular;  Laterality: N/A;   BASAL CELL CARCINOMA EXCISION  01/2023   right nose, back   BRONCHIAL BIOPSY  02/09/2022   Procedure: BRONCHIAL BIOPSIES;  Surgeon: Prudy Brownie, DO;  Location: [facility] ENDOSCOPY;  Service: Pulmonary;;   BRONCHIAL NEEDLE ASPIRATION BIOPSY  02/09/2022   Procedure:  BRONCHIAL NEEDLE ASPIRATION BIOPSIES;  Surgeon: Prudy Brownie, DO;  Location: [facility] ENDOSCOPY;  Service: Pulmonary;;   COLONOSCOPY WITH PROPOFOL  N/A 12/11/2021   Procedure: COLONOSCOPY WITH PROPOFOL ;  Surgeon: Alvis Jourdain, MD;  Location: [facility] ENDOSCOPY;  Service: Gastroenterology;  Laterality: N/A;   HERNIA REPAIR     umbilical hernia   IR IMAGING GUIDED PORT INSERTION  02/18/2022   SKIN SURGERY Left    Basal cell carcinoma on left forearm   XI ROBOTIC ASSISTED LOWER ANTERIOR RESECTION N/A 01/09/2021   Procedure: XI ROBOTIC ASSISTED RIGHT HEMI COLECTOMY, LYSIS OF ADHESIONS, SMALL BOWEL RESECTION, INTRAOPERATIVE ASSESMENT OF PERFUSION, PARTIAL OMENTECTOMY;  Surgeon: Melvenia Stabs, MD;  Location: [facility] ORS;  Service: General;  Laterality: N/A;    I have reviewed the social history and family history with the patient and they are unchanged from previous note.  ALLERGIES:  is allergic to furosemide  and tramadol .  MEDICATIONS:  Current Outpatient Medications  Medication Sig Dispense Refill   acetaminophen  (TYLENOL ) 500 MG tablet Take 500-1,000 mg by mouth every 6 (six) hours as needed (pain.).  amiodarone  (PACERONE ) 200 MG tablet Take 200 mg by mouth daily.     amLODipine  (NORVASC ) 5 MG tablet Take 1 tablet (5 mg total) by mouth daily. 90 tablet 3   ascorbic acid  (VITAMIN C) 500 MG tablet Take 500 mg by mouth daily.     Calcium  Carbonate-Vit D-Min (CALCIUM  600+D3 PLUS MINERALS) 600-800 MG-UNIT TABS Take 1 tablet by mouth daily.     carvedilol  (COREG ) 12.5 MG tablet Take 1 tablet (12.5 mg total) by mouth 2 (two) times daily. 180 tablet 3   Cholecalciferol  (D3-1000) 25 MCG (1000 UT) capsule Take 1,000 Units by mouth daily.     diphenoxylate -atropine  (LOMOTIL ) 2.5-0.025 MG tablet Take 1 tablet by mouth 4 (four) times daily as needed for diarrhea or loose stools. 30 tablet 3   ELIQUIS  5 MG TABS tablet Take 1 tablet (5 mg total) by mouth 2 (two) times daily. 180 tablet 1    lidocaine -prilocaine  (EMLA ) cream Apply 1 Application topically as needed (every 3 months).     Misc Natural Products (JOINT SUPPORT PO) Take 1 tablet by mouth daily. Instaflex Advanced Joint Support     nitroGLYCERIN  (NITROSTAT ) 0.4 MG SL tablet Place 1 tablet (0.4 mg total) under the tongue every 5 (five) minutes as needed. 25 tablet 5   potassium chloride  SA (KLOR-CON  M) 20 MEQ tablet Take 1 tablet (20 mEq total) by mouth daily. (Patient taking differently: Take 20 mEq by mouth daily as needed (at the MD request).) 30 tablet 1   sacubitril -valsartan  (ENTRESTO ) 97-103 MG Take 1 tablet by mouth 2 (two) times daily. 180 tablet 3   vitamin B-12 (CYANOCOBALAMIN ) 1000 MCG tablet Take 1,000 mcg by mouth every evening.     Zinc 50 MG CAPS Take 50 mg by mouth every evening.     No current facility-administered medications for this visit.   Facility-Administered Medications Ordered in Other Visits  Medication Dose Route Frequency Provider Last Rate Last Admin   sodium chloride  flush (NS) 0.9 % injection 10 mL  10 mL Intracatheter PRN Sonja [place], MD   10 mL at 11/19/22 0910    PHYSICAL EXAMINATION: Not performed   LABORATORY DATA:  I have reviewed the data as listed    Latest Ref Rng & Units 10/27/2023   10:50 AM 09/19/2023   10:48 AM 08/01/2023   10:31 AM  CBC  WBC 4.0 - 10.5 K/uL 5.3  5.0  5.3   Hemoglobin 13.0 - 17.0 g/dL 44.0  34.7  42.5   Hematocrit 39.0 - 52.0 % 45.3  46.7  46.7   Platelets 150 - 400 K/uL 156  161  163         Latest Ref Rng & Units 10/27/2023   10:50 AM 09/19/2023   10:48 AM 08/01/2023   10:31 AM  CMP  Glucose 70 - 99 mg/dL 97  90  956   BUN 8 - 23 mg/dL 14  13  11    Creatinine 0.61 - 1.24 mg/dL 3.87  5.64  3.32   Sodium 135 - 145 mmol/L 141  140  141   Potassium 3.5 - 5.1 mmol/L 3.8  4.0  4.0   Chloride 98 - 111 mmol/L 108  108  108   CO2 22 - 32 mmol/L 30  28  28    Calcium  8.9 - 10.3 mg/dL 8.5  8.7  8.7   Total Protein 6.5 - 8.1 g/dL 6.5  6.9  6.7   Total  Bilirubin 0.0 - 1.2 mg/dL 0.7  0.6  0.6   Alkaline Phos 38 - 126 U/L 57  55  58   AST 15 - 41 U/L 19  12  15    ALT 0 - 44 U/L 24  11  11        RADIOGRAPHIC STUDIES: I have personally reviewed the radiological images as listed and agreed with the findings in the report. No results found.     I discussed the assessment and treatment plan with the patient. The patient was provided an opportunity to ask questions and all were answered. The patient agreed with the plan and demonstrated an understanding of the instructions.   The patient was advised to call back or seek an in-person evaluation if the symptoms worsen or if the condition fails to improve as anticipated.  I provided 25 minutes of non face-to-face telephone visit time during this encounter, including review of chart and various tests results, discussions about plan of care and coordination of care plan.    Sonja [place], MD 11/29/23

## 2023-12-22 ENCOUNTER — Encounter (HOSPITAL_COMMUNITY): Payer: Self-pay

## 2023-12-22 ENCOUNTER — Ambulatory Visit (HOSPITAL_COMMUNITY): Admitting: Internal Medicine

## 2023-12-22 ENCOUNTER — Ambulatory Visit (HOSPITAL_COMMUNITY)
Admission: RE | Admit: 2023-12-22 | Discharge: 2023-12-22 | Disposition: A | Discharge status: Home / Self Care | Source: Ambulatory Visit | Attending: Internal Medicine | Admitting: Internal Medicine

## 2023-12-22 VITALS — BP 144/80 | HR 52 | Ht 71.0 in | Wt 249.8 lb

## 2023-12-22 DIAGNOSIS — Z79899 Other long term (current) drug therapy: Secondary | ICD-10-CM | POA: Diagnosis not present

## 2023-12-22 DIAGNOSIS — I4819 Other persistent atrial fibrillation: Secondary | ICD-10-CM

## 2023-12-22 DIAGNOSIS — Z5181 Encounter for therapeutic drug level monitoring: Secondary | ICD-10-CM

## 2023-12-22 DIAGNOSIS — I4891 Unspecified atrial fibrillation: Secondary | ICD-10-CM

## 2023-12-22 DIAGNOSIS — D6869 Other thrombophilia: Secondary | ICD-10-CM

## 2023-12-22 NOTE — Progress Notes (Signed)
 Primary Care Physician: Conley Dene BROCKS, DO Primary Cardiologist: Kardie Tobb, DO Electrophysiologist: Will Gladis Norton, MD     Referring Physician: Dr. Norton [place] ADANTE COURINGTON is a 78 y.o. male with a history of colon cancer metastatic disease to lung, HTN, CAD, OSA, chronic diastolic HF, HLD, and atrial fibrillation who presents for consultation in the [facility]. Patient is on Eliquis  5 mg BID for a CHADS2VASC score of 5.  On evaluation today, patient is currently in NSR. S/p Afib/flutter ablation on 11/24/23 by Dr. Norton. No episodes of Afib since ablation. No chest pain or SOB. Leg sites healed without issue. No missed doses of anticoagulant.  Today, he denies symptoms of orthopnea, PND, lower extremity edema, dizziness, presyncope, syncope, snoring, daytime somnolence, bleeding, or neurologic sequela. The patient is tolerating medications without difficulties and is otherwise without complaint today.    he has a BMI of Body mass index is 34.84 kg/m.SABRA Filed Weights   12/22/23 1353  Weight: 113.3 kg    Current Outpatient Medications  Medication Sig Dispense Refill   acetaminophen  (TYLENOL ) 500 MG tablet Take 500-1,000 mg by mouth every 6 (six) hours as needed (pain.). (Patient taking differently: Take 500-1,000 mg by mouth as needed (pain.).)     amiodarone  (PACERONE ) 200 MG tablet Take 200 mg by mouth daily.     amLODipine  (NORVASC ) 5 MG tablet Take 1 tablet (5 mg total) by mouth daily. 90 tablet 3   ascorbic acid  (VITAMIN C) 500 MG tablet Take 500 mg by mouth daily.     Calcium  Carbonate-Vit D-Min (CALCIUM  600+D3 PLUS MINERALS) 600-800 MG-UNIT TABS Take 1 tablet by mouth daily.     carvedilol  (COREG ) 12.5 MG tablet Take 1 tablet (12.5 mg total) by mouth 2 (two) times daily. 180 tablet 3   Cholecalciferol  (D3-1000) 25 MCG (1000 UT) capsule Take 1,000 Units by mouth daily.     diphenoxylate -atropine  (LOMOTIL ) 2.5-0.025 MG tablet Take 1  tablet by mouth 4 (four) times daily as needed for diarrhea or loose stools. (Patient taking differently: Take 1 tablet by mouth as needed for diarrhea or loose stools.) 30 tablet 3   ELIQUIS  5 MG TABS tablet Take 1 tablet (5 mg total) by mouth 2 (two) times daily. 180 tablet 1   lidocaine -prilocaine  (EMLA ) cream Apply 1 Application topically as needed (every 3 months).     Misc Natural Products (JOINT SUPPORT PO) Take 1 tablet by mouth daily. Instaflex Advanced Joint Support     nitroGLYCERIN  (NITROSTAT ) 0.4 MG SL tablet Place 1 tablet (0.4 mg total) under the tongue every 5 (five) minutes as needed. 25 tablet 5   sacubitril -valsartan  (ENTRESTO ) 97-103 MG Take 1 tablet by mouth 2 (two) times daily. 180 tablet 3   vitamin B-12 (CYANOCOBALAMIN ) 1000 MCG tablet Take 1,000 mcg by mouth every evening.     Zinc 50 MG CAPS Take 50 mg by mouth every evening.     potassium chloride  SA (KLOR-CON  M) 20 MEQ tablet Take 1 tablet (20 mEq total) by mouth daily. (Patient not taking: Reported on 12/22/2023) 30 tablet 1   No current facility-administered medications for this encounter.   Facility-Administered Medications Ordered in Other Encounters  Medication Dose Route Frequency Provider Last Rate Last Admin   sodium chloride  flush (NS) 0.9 % injection 10 mL  10 mL Intracatheter PRN [name], MD   10 mL at 11/19/22 0910    Atrial Fibrillation Management history:  Previous antiarrhythmic drugs:  amiodarone  Previous cardioversions: none Previous ablations: 11/24/23 Anticoagulation history: Eliquis    ROS- All systems are reviewed and negative except as per the HPI above.  Physical Exam: BP (!) 144/80   Pulse (!) 52   Ht 5' 11 (1.803 m)   Wt 113.3 kg   BMI 34.84 kg/m   GEN: Well nourished, well developed in no acute distress NECK: No JVD; No carotid bruits CARDIAC: Regular rate and rhythm, no murmurs, rubs, gallops RESPIRATORY:  Clear to auscultation without rales, wheezing or rhonchi  ABDOMEN:  Soft, non-tender, non-distended EXTREMITIES:  No edema; No deformity   EKG today demonstrates  Vent. rate 52 BPM PR interval 216 ms QRS duration 82 ms QT/QTcB 418/388 ms P-R-T axes 65 18 12 Sinus bradycardia with 1st degree A-V block Otherwise normal ECG When compared with ECG of 24-Nov-2023 11:21, Non-specific change in ST segment in Inferior leads  Echo 06/30/23 demonstrated   1. Left ventricular ejection fraction, by estimation, is 55 to 60%. The  left ventricle has normal function. The left ventricle has no regional  wall motion abnormalities. There is mild concentric left ventricular  hypertrophy. Left ventricular diastolic  parameters are indeterminate. The average left ventricular global  longitudinal strain is 16.1 %. The global longitudinal strain is abnormal.   2. Right ventricular systolic function is normal. The right ventricular  size is normal. There is normal pulmonary artery systolic pressure.   3. Left atrial size was mildly dilated.   4. The mitral valve is normal in structure. No evidence of mitral valve  regurgitation. No evidence of mitral stenosis.   5. The aortic valve is tricuspid. Aortic valve regurgitation is not  visualized. No aortic stenosis is present.   6. There is mild dilatation of the descending aorta, measuring 35 mm.   7. The inferior vena cava is normal in size with greater than 50%  respiratory variability, suggesting right atrial pressure of 3 mmHg.   ASSESSMENT & PLAN CHA2DS2-VASc Score = 5  The patient's score is based upon: CHF History: 1 HTN History: 1 Diabetes History: 0 Stroke History: 0 Vascular Disease History: 1 Age Score: 2 Gender Score: 0       ASSESSMENT AND PLAN: Persistent Atrial Fibrillation (ICD10:  I48.19) The patient's CHA2DS2-VASc score is 5, indicating a 7.2% annual risk of stroke.   S/p Afib/flutter ablation on 11/24/23 by Dr. Inocencio.  He is currently in NSR. Continue coreg  12.5 mg BID.    Secondary  Hypercoagulable State (ICD10:  D68.69) The patient is at significant risk for stroke/thromboembolism based upon his CHA2DS2-VASc Score of 5.  Continue Apixaban  (Eliquis ).  Continue Eliquis  without interruption.   High risk medication monitoring (ICD10: [id number]) Patient requires ongoing monitoring for anti-arrhythmic medication which has the potential to cause life threatening arrhythmias or AV block. Qtc stable. Continue amiodarone  200 mg daily.    Follow up with Afib clinic as scheduled.   Terra Pac, PA-C  Af[facility] Clinic [facility] [address] [place], [place] [address] [phone]

## 2023-12-29 ENCOUNTER — Other Ambulatory Visit: Payer: Self-pay

## 2023-12-29 ENCOUNTER — Ambulatory Visit (HOSPITAL_COMMUNITY)
Admission: RE | Admit: 2023-12-29 | Discharge: 2023-12-29 | Disposition: A | Discharge status: Home / Self Care | Source: Ambulatory Visit | Attending: Hematology | Admitting: Hematology

## 2023-12-29 ENCOUNTER — Inpatient Hospital Stay: Attending: Physician Assistant

## 2023-12-29 DIAGNOSIS — C182 Malignant neoplasm of ascending colon: Secondary | ICD-10-CM

## 2023-12-29 DIAGNOSIS — I4891 Unspecified atrial fibrillation: Secondary | ICD-10-CM | POA: Diagnosis not present

## 2023-12-29 DIAGNOSIS — Z923 Personal history of irradiation: Secondary | ICD-10-CM | POA: Insufficient documentation

## 2023-12-29 DIAGNOSIS — C189 Malignant neoplasm of colon, unspecified: Secondary | ICD-10-CM | POA: Insufficient documentation

## 2023-12-29 DIAGNOSIS — C7800 Secondary malignant neoplasm of unspecified lung: Secondary | ICD-10-CM | POA: Insufficient documentation

## 2023-12-29 DIAGNOSIS — R600 Localized edema: Secondary | ICD-10-CM | POA: Insufficient documentation

## 2023-12-29 DIAGNOSIS — C7801 Secondary malignant neoplasm of right lung: Secondary | ICD-10-CM | POA: Insufficient documentation

## 2023-12-29 DIAGNOSIS — G8929 Other chronic pain: Secondary | ICD-10-CM | POA: Insufficient documentation

## 2023-12-29 DIAGNOSIS — E860 Dehydration: Secondary | ICD-10-CM

## 2023-12-29 DIAGNOSIS — C184 Malignant neoplasm of transverse colon: Secondary | ICD-10-CM | POA: Diagnosis not present

## 2023-12-29 DIAGNOSIS — I4819 Other persistent atrial fibrillation: Secondary | ICD-10-CM | POA: Diagnosis not present

## 2023-12-29 DIAGNOSIS — Z452 Encounter for adjustment and management of vascular access device: Secondary | ICD-10-CM | POA: Insufficient documentation

## 2023-12-29 DIAGNOSIS — E782 Mixed hyperlipidemia: Secondary | ICD-10-CM | POA: Diagnosis not present

## 2023-12-29 DIAGNOSIS — R918 Other nonspecific abnormal finding of lung field: Secondary | ICD-10-CM | POA: Insufficient documentation

## 2023-12-29 DIAGNOSIS — Z801 Family history of malignant neoplasm of trachea, bronchus and lung: Secondary | ICD-10-CM | POA: Diagnosis not present

## 2023-12-29 DIAGNOSIS — Z79899 Other long term (current) drug therapy: Secondary | ICD-10-CM | POA: Diagnosis not present

## 2023-12-29 DIAGNOSIS — I700 Atherosclerosis of aorta: Secondary | ICD-10-CM | POA: Insufficient documentation

## 2023-12-29 DIAGNOSIS — I10 Essential (primary) hypertension: Secondary | ICD-10-CM | POA: Diagnosis not present

## 2023-12-29 DIAGNOSIS — Z85828 Personal history of other malignant neoplasm of skin: Secondary | ICD-10-CM | POA: Insufficient documentation

## 2023-12-29 DIAGNOSIS — E669 Obesity, unspecified: Secondary | ICD-10-CM | POA: Insufficient documentation

## 2023-12-29 DIAGNOSIS — Z8616 Personal history of COVID-19: Secondary | ICD-10-CM | POA: Diagnosis not present

## 2023-12-29 DIAGNOSIS — Z7901 Long term (current) use of anticoagulants: Secondary | ICD-10-CM | POA: Insufficient documentation

## 2023-12-29 LAB — COMPREHENSIVE METABOLIC PANEL WITH GFR
ALT: 13 U/L (ref 0–44)
AST: 15 U/L (ref 15–41)
Albumin: 3.7 g/dL (ref 3.5–5.0)
Alkaline Phosphatase: 48 U/L (ref 38–126)
Anion gap: 4 — ABNORMAL LOW (ref 5–15)
BUN: 14 mg/dL (ref 8–23)
CO2: 30 mmol/L (ref 22–32)
Calcium: 8.9 mg/dL (ref 8.9–10.3)
Chloride: 107 mmol/L (ref 98–111)
Creatinine, Ser: 0.93 mg/dL (ref 0.61–1.24)
GFR, Estimated: >60 mL/min (ref >60)
Glucose, Bld: 101 mg/dL — ABNORMAL HIGH (ref 70–99)
Potassium: 4.1 mmol/L (ref 3.5–5.1)
Sodium: 141 mmol/L (ref 135–145)
Total Bilirubin: 0.6 mg/dL (ref 0.0–1.2)
Total Protein: 6.5 g/dL (ref 6.5–8.1)

## 2023-12-29 LAB — CBC WITH DIFFERENTIAL/PLATELET
Abs Immature Granulocytes: 0.01 10*3/uL (ref 0.00–0.07)
Basophils Absolute: 0 10*3/uL (ref 0.0–0.1)
Basophils Relative: 1 %
Eosinophils Absolute: 0.1 10*3/uL (ref 0.0–0.5)
Eosinophils Relative: 2 %
HCT: 43.4 % (ref 39.0–52.0)
Hemoglobin: 14.7 g/dL (ref 13.0–17.0)
Immature Granulocytes: 0 %
Lymphocytes Relative: 21 %
Lymphs Abs: 1 10*3/uL (ref 0.7–4.0)
MCH: 31.7 pg (ref 26.0–34.0)
MCHC: 33.9 g/dL (ref 30.0–36.0)
MCV: 93.7 fL (ref 80.0–100.0)
Monocytes Absolute: 0.6 10*3/uL (ref 0.1–1.0)
Monocytes Relative: 13 %
Neutro Abs: 3 10*3/uL (ref 1.7–7.7)
Neutrophils Relative %: 63 %
Platelets: 148 10*3/uL — ABNORMAL LOW (ref 150–400)
RBC: 4.63 MIL/uL (ref 4.22–5.81)
RDW: 15.2 % (ref 11.5–15.5)
WBC: 4.6 10*3/uL (ref 4.0–10.5)
nRBC: 0 % (ref 0.0–0.2)

## 2023-12-29 LAB — CEA (ACCESS): CEA (CHCC): 3 ng/mL (ref 0.00–5.00)

## 2023-12-29 MED ORDER — HEPARIN SOD (PORK) LOCK FLUSH 100 UNIT/ML IV SOLN
500.0000 [IU] | Freq: Once | INTRAVENOUS | Status: AC
Start: 2023-12-29 — End: 2023-12-29
  Administered 2023-12-29: 500 [IU]

## 2023-12-29 MED ORDER — SODIUM CHLORIDE 0.9% FLUSH
10.0000 mL | Freq: Once | INTRAVENOUS | Status: AC
  Administered 2023-12-29: 10 mL

## 2024-01-04 NOTE — Assessment & Plan Note (Signed)
-  stage II (pT3, N0), metastatic disease to lung and possible nodes in July 2023, MSS, KRAS/NRAS/BRAF wild type   -found on screening colonoscopy. S/p right hemicolectomy 01/09/21. -lung metastasis confirmed in [facility] by bronchoscopy on 02/09/22. -[facility] showed MSS, low tumor burden, KRAS/NRAS/BRAF wild-type, no other targetable mutations. The benefit of EGFR inhibitor is much less in right-sided colon cancer, so I will not give it as first line but may consider in subsequent lines.  -he started FOLFOX on 03/03/22. He tolerated well overall with diarrhea for several days. Bevacizumab added with C2 (9/20).  -CT scan 05/26/2022 showed improved lung mets, but showed new infiltrative lung change, possible related to his recent URI -repeated CT chest on 07/26/2022 showed stable known lung mets (overall excellent response, with minimum residual dsease) and resolved previously see inflammatory change in RLL -I have changed treatment to maintenance xeloda and beva every 3 weeks, he started C1 on 08/18/2022.  -due to recurrent diarrhea, I stopped Xeloda after 2 cycles -we have changed his treatment to 5-fu pump infusion and beva every 2 weeks on 4/24, he tolerated reasonably well, diarrhea has resolved now. -Restaging CT scan yesterday showed stable appearance of previously scattered small pulmonary nodules, measuring 4-75mm, no new lesions.  Overall has much improved compared to his initial CT scan from July 2023, and it is a hard to tell if this is residual disease versus scar tissue.  -We stopped his chemo after last scan and he is on cancer surveillance now. -Personally reviewed his follow-up CT scan from February 14, 2023, which showed slightly increase the size of the 2 nodule in the right lung, most consistent with disease progression.  No other new metastasis. -He completed SBRT on 04/11/2023

## 2024-01-05 ENCOUNTER — Inpatient Hospital Stay: Admitting: Hematology

## 2024-01-05 ENCOUNTER — Encounter: Payer: Self-pay | Admitting: Hematology

## 2024-01-05 VITALS — BP 138/70 | HR 53 | Temp 98.3°F | Resp 16 | Ht 71.0 in | Wt 248.8 lb

## 2024-01-05 DIAGNOSIS — Z7901 Long term (current) use of anticoagulants: Secondary | ICD-10-CM | POA: Diagnosis not present

## 2024-01-05 DIAGNOSIS — I10 Essential (primary) hypertension: Secondary | ICD-10-CM | POA: Diagnosis not present

## 2024-01-05 DIAGNOSIS — C7801 Secondary malignant neoplasm of right lung: Secondary | ICD-10-CM | POA: Diagnosis not present

## 2024-01-05 DIAGNOSIS — R600 Localized edema: Secondary | ICD-10-CM | POA: Diagnosis not present

## 2024-01-05 DIAGNOSIS — Z8616 Personal history of COVID-19: Secondary | ICD-10-CM | POA: Diagnosis not present

## 2024-01-05 DIAGNOSIS — Z923 Personal history of irradiation: Secondary | ICD-10-CM | POA: Diagnosis not present

## 2024-01-05 DIAGNOSIS — G8929 Other chronic pain: Secondary | ICD-10-CM | POA: Diagnosis not present

## 2024-01-05 DIAGNOSIS — I4891 Unspecified atrial fibrillation: Secondary | ICD-10-CM | POA: Diagnosis not present

## 2024-01-05 DIAGNOSIS — R918 Other nonspecific abnormal finding of lung field: Secondary | ICD-10-CM | POA: Diagnosis not present

## 2024-01-05 DIAGNOSIS — Z85828 Personal history of other malignant neoplasm of skin: Secondary | ICD-10-CM | POA: Diagnosis not present

## 2024-01-05 DIAGNOSIS — E669 Obesity, unspecified: Secondary | ICD-10-CM | POA: Diagnosis not present

## 2024-01-05 DIAGNOSIS — C182 Malignant neoplasm of ascending colon: Secondary | ICD-10-CM | POA: Diagnosis not present

## 2024-01-05 DIAGNOSIS — Z79899 Other long term (current) drug therapy: Secondary | ICD-10-CM | POA: Diagnosis not present

## 2024-01-05 DIAGNOSIS — Z801 Family history of malignant neoplasm of trachea, bronchus and lung: Secondary | ICD-10-CM | POA: Diagnosis not present

## 2024-01-05 DIAGNOSIS — C184 Malignant neoplasm of transverse colon: Secondary | ICD-10-CM | POA: Diagnosis not present

## 2024-01-05 DIAGNOSIS — I4819 Other persistent atrial fibrillation: Secondary | ICD-10-CM | POA: Diagnosis not present

## 2024-01-05 DIAGNOSIS — I700 Atherosclerosis of aorta: Secondary | ICD-10-CM | POA: Diagnosis not present

## 2024-01-05 DIAGNOSIS — E782 Mixed hyperlipidemia: Secondary | ICD-10-CM | POA: Diagnosis not present

## 2024-01-05 DIAGNOSIS — Z452 Encounter for adjustment and management of vascular access device: Secondary | ICD-10-CM | POA: Diagnosis not present

## 2024-01-05 NOTE — Progress Notes (Signed)
 [facility]   [phone]) [phone] Fax:(336) [phone]   Clinic Follow up Note   Patient Care Team: Conley Dene BROCKS, DO as PCP - General (Family Medicine) Sheena Pugh, DO as PCP - Cardiology (Cardiology) Inocencio Soyla Lunger, MD as PCP - Electrophysiology (Cardiology) [name], MD as Attending Physician (Hematology and Oncology)  Date of Service:  01/05/2024  CHIEF COMPLAINT: f/u of metastatic colon cancer  CURRENT THERAPY:  Observation  Oncology History   Cancer of right colon [facility]) -stage II (pT3, N0), metastatic disease to lung and possible nodes in July 2023, MSS, KRAS/NRAS/BRAF wild type   -found on screening colonoscopy. S/p right hemicolectomy 01/09/21. -lung metastasis confirmed in RUL by bronchoscopy on 02/09/22. -[facility] showed MSS, low tumor burden, KRAS/NRAS/BRAF wild-type, no other targetable mutations. The benefit of EGFR inhibitor is much less in right-sided colon cancer, so I will not give it as first line but may consider in subsequent lines.  -he started FOLFOX on 03/03/22. He tolerated well overall with diarrhea for several days. Bevacizumab  added with C2 (9/20).  -CT scan 05/26/2022 showed improved lung mets, but showed new infiltrative lung change, possible related to his recent URI -repeated CT chest on 07/26/2022 showed stable known lung mets (overall excellent response, with minimum residual dsease) and resolved previously see inflammatory change in RLL -I have changed treatment to maintenance xeloda  and beva every 3 weeks, he started C1 on 08/18/2022.  -due to recurrent diarrhea, I stopped Xeloda  after 2 cycles -we have changed his treatment to 5-fu pump infusion and beva every 2 weeks on 4/24, he tolerated reasonably well, diarrhea has resolved now. -Restaging CT scan yesterday showed stable appearance of previously scattered small pulmonary nodules, measuring 4-83mm, no new lesions.  Overall has much improved compared to his initial CT  scan from July 2023, and it is a hard to tell if this is residual disease versus scar tissue.  -We stopped his chemo after last scan and he is on cancer surveillance now. -Personally reviewed his follow-up CT scan from February 14, 2023, which showed slightly increase the size of the 2 nodule in the right lung, most consistent with disease progression.  No other new metastasis. -He completed SBRT on 04/11/2023   Assessment & Plan Metastatic colon cancer with lung involvement Recent CT scan shows growth in nodules in the left lower lobe and right middle lobe of the lung, 5-60mm size. The left lower lobe nodule has grown slightly and appears more solid, raising suspicion for cancer. The right middle lobe nodule is very small but also concerning for cancer. Previous radiation treatment has resulted in chronic inflammation and scarring in the lungs. Blood test ([facility]) was negative, indicating negative circulating tumor cells  - We reviewed his imaging in our thoracic tumor conference this morning.  Due to the slowly increased size of lung nodule, especially from last December 2024, and appearance of the nodules, we feel those are highly suspicious for lung metastasis.  I will refer him to Dr. Dewey for radiation treatment consultation. He agrees.  We also discussed the option of ablation by pulmonologist, however due to his anticoagulation for recent atrial fibrillation ablation, he cannot do invasive procedure in the next 2 to 3 months. - Schedule Signatera blood test on the same day as Dr. Smitty consultation. If it's positive, will f/u in future  - Proceed with radiation treatment for the left lung nodule and possibly the right lung nodule. - Follow up with CT scan three months post-radiation treatment.  Radiation-induced lung changes Chronic inflammation and scarring in the lungs due to previous radiation treatment, causing cough and wheezing, particularly when lying down. No evidence of bronchitis on  CT scan.  Atrial fibrillation, post-ablation Atrial fibrillation status post-ablation on Nov 24, 2023. Currently in normal rhythm. On blood thinner (Eliquis ) which cannot be discontinued due to heart condition. - Continue Eliquis  as prescribed. - Follow up with cardiologist as scheduled.  Peripheral edema Mild peripheral edema, possibly related to decreased water intake. - Increase water intake.  Plan - He is clinically doing very well, asymptomatic except mild cough - I personally reviewed his restaging CT images and compared to previous scans, we will refer him to radiation oncologist Dr. Dewey to consider SBRT treatment.  It was reviewed this morning in thoracic tumor conference. - Will obtain Signatera in the next week - Lab and follow-up in 6 weeks.   SUMMARY OF ONCOLOGIC HISTORY: Oncology History Overview Note   Cancer Staging  Cancer of right colon [facility]) Staging form: Colon and Rectum, AJCC 8th Edition - Pathologic stage from 01/09/2021: Stage IIA (pT3, pN0, cM0) - Signed by [name], MD on 02/12/2022 Total positive nodes: 0 Histologic grading system: 4 grade system Histologic grade (G): G2 Residual tumor (R): R0 - None     Cancer of right colon (HCC)  10/2020 Procedure   Colonoscopy-Dr. Rollin     11/06/2020 Imaging   CT CHEST ABDOMEN PELVIS W CONTRAST   IMPRESSION: 1. 4.6 cm partially circumferential apple-core type neoplastic process involving the mid transverse colon. No findings for locoregional adenopathy or distant metastatic disease. 2. Enlarged prostate gland with median lobe hypertrophy impressing on the base of the bladder. 3. 16 mm left thyroid  nodule. Recommend follow-up thyroid  ultrasound examination.   01/09/2021 Initial Diagnosis   Colon cancer (HCC)   01/09/2021 Pathology Results   B. COLON, RIGHT, RESECTION:  -  Adenocarcinoma, moderately differentiated, 3.0 cm  -  No carcinoma identified in seventeen lymph nodes (0/17)  -  Margins uninvolved  by carcinoma  -  Tubular adenoma (x2)  -  Benign appendix  -  See oncology table and comment below   Margin Status for Invasive Carcinoma: All margins negative for  invasive carcinoma       Distance from Invasive Carcinoma to Radial (Circumferential)       Margin Status for Non-Invasive Tumor: All margins negative for  high-grade dysplasia / intramucosal carcinoma and low-grade dysplasia  Regional Lymph Nodes:       Number of Lymph Nodes with Tumor: 0       Number of Lymph Nodes Examined: 17  Tumor Deposits: 0  Distant Metastasis:       Distant Site(s) Involved: Not applicable  Pathologic Stage Classification (pTNM, AJCC 8th Edition): pT3, pN0   MMR Stable    01/09/2021 Cancer Staging   Staging form: Colon and Rectum, AJCC 8th Edition - Pathologic stage from 01/09/2021: Stage IIA (pT3, pN0, cM0) - Signed by [name], MD on 02/12/2022 Total positive nodes: 0 Histologic grading system: 4 grade system Histologic grade (G): G2 Residual tumor (R): R0 - None   05/2021 Tumor Marker   Patient's tumor was tested for the following markers: CEA. Results of the tumor marker test revealed 2.1.   12/11/2021 Procedure   Colonoscopy-Dr. Rollin  There was evidence of a prior functional end-to-end ileo-colonic anastomosis in the transverse colon. This was patent and was characterized by healthy appearing mucosa. The anastomosis was traversed. Findings: Scattered small and large-mouthed diverticula  were found in the sigmoid colon. - Patent functional end-to-end ileo-colonic anastomosis, characterized by healthy appearing mucosa. - Diverticulosis in the sigmoid colon. - No specimens collected.   01/25/2022 Imaging   CT CHEST ABDOMEN PELVIS W CONTRAST   IMPRESSION: 1. New right-sided pulmonary nodules measure up to 11 mm, nonspecific but concerning for metastatic disease. Consider further evaluation with nuclear medicine PET/CT. 2. New nodularity/lymph nodes in the central mesentery,  concerning for nodal disease involvement. 3. Slightly increased size of prominent retroperitoneal lymph nodes, nonspecific but also somewhat concerning for nodal disease involvement. 4. Surgical change of partial colectomy without evidence of local recurrence. 5. Stable prominent mediastinal lymph nodes, nonspecific but stability is reassuring. Attention on follow-up imaging suggested. 6. Stable prominent right external iliac lymph node, nonspecific but stability is reassuring. Attention on follow-up imaging suggested. 7. Stable hypodense 8 mm hepatic lesion technically too small to accurately characterize but stability is reassuring. Attention on follow-up imaging suggested. 8. 1.9 cm incidental left thyroid  nodule. Recommend thyroid  US . Reference: J Am Coll Radiol. 2015 Feb;12(2): 143-50 9.  Aortic Atherosclerosis (ICD10-I70.0).   02/09/2022 Procedure   Bronchoscopy under the care of Dr. Brenna    02/09/2022 Pathology Results   FINAL MICROSCOPIC DIAGNOSIS:   A. LUNG, RUL, FINE NEEDLE ASPIRATION  BIOPSY FORCEPS:  Adenocarcinoma with morphologic features consistent with metastasis from a colorectal primary.  Please see comment.   Comment: The following immunostains are performed with appropriate controls :  TTF-1: Negative .  Napsin A: Negative .  CK7: Negative.  CK20: Positive.  CDX2: Positive .   The above immuno histochemical profile is supportive of the rendered diagnosis.    08/18/2022 - 08/18/2022 Chemotherapy   Patient is on Treatment Plan : COLORECTAL Bevacizumab  q14d     Colon cancer metastasized to lung [facility])  02/09/2022 Pathology Results   FINAL MICROSCOPIC DIAGNOSIS:   A. LUNG, RUL, FINE NEEDLE ASPIRATION  BIOPSY FORCEPS:  Adenocarcinoma with morphologic features consistent with metastasis from a colorectal primary.  Please see comment.   Comment: The following immunostains are performed with appropriate controls :  TTF-1: Negative .  Napsin A: Negative .   CK7: Negative.  CK20: Positive.  CDX2: Positive .   The above immuno histochemical profile is supportive of the rendered diagnosis.    02/12/2022 Initial Diagnosis   Colon cancer metastasized to lung (HCC)   03/03/2022 - 07/31/2022 Chemotherapy   Patient is on Treatment Plan : COLORECTAL FOLFOX + Bevacizumab  q14d     07/26/2022 Imaging    IMPRESSION: 1. Near-complete interval resolution of nodular and masslike areas of irregular consolidation, likely resolving infection or organizing pneumonia/drug reaction. 2. Additional previously seen pulmonary nodules are stable, consistent with treated pulmonary metastatic disease. 3. Mild peribronchovascular reticular opacities which are most pronounced in the mid and lower lungs, similar to prior exam, progressed when compared with prior dated January 15, 2022. Findings can be seen in the setting of interstitial lung disease, pattern is most consistent with fibrotic NSIP. 4. Aortic Atherosclerosis (ICD10-I70.0).   07/26/2022 Imaging    IMPRESSION: 1. Near-complete interval resolution of nodular and masslike areas of irregular consolidation, likely resolving infection or organizing pneumonia/drug reaction. 2. Additional previously seen pulmonary nodules are stable, consistent with treated pulmonary metastatic disease. 3. Mild peribronchovascular reticular opacities which are most pronounced in the mid and lower lungs, similar to prior exam, progressed when compared with prior dated January 15, 2022. Findings can be seen in the setting of interstitial lung disease, pattern is most  consistent with fibrotic NSIP. 4. Aortic Atherosclerosis (ICD10-I70.0).   09/08/2022 - 09/08/2022 Chemotherapy   Patient is on Treatment Plan : COLORECTAL Bevacizumab  q21d     10/20/2022 -  Chemotherapy   Patient is on Treatment Plan : COLORECTAL 5FU + Leucovorin  (Modified DeGramont) + Bevacizumab  q14d        Discussed the use of AI scribe software for clinical  note transcription with the patient, who gave verbal consent to proceed.  History of Present Illness Thomas Knight is a 78 year old male with metastatic colon cancer who presents for follow-up.  He has metastatic colon cancer with prior treatment including chemotherapy and radiation therapy. He is under surveillance for lung nodules. A recent CT scan shows changes in the nodules, with a nodule in the upper lobe of the right lung previously treated with radiation, and new nodules in the left lower lobe and right middle lobe.  He experiences nocturnal wheezing when lying down, relieved by using two pillows, and sporadic coughing. His appetite is good, and he has regained weight after a previous loss of 48 pounds. He is able to taste and enjoy food.     All other systems were reviewed with the patient and are negative.  MEDICAL HISTORY:  Past Medical History:  Diagnosis Date   Bilateral leg edema 08/24/2019   Cancer (HCC)    skin ca, colon   Chronic cough    Chronic pain of left knee 12/01/2015   COVID-19    06/2019   Dysrhythmia    afib  was cardioverted   Essential hypertension 08/24/2019   Mixed hyperlipidemia 08/24/2019   Obesity (BMI 30-39.9) 08/24/2019   Persistent atrial fibrillation (HCC) 08/24/2019    SURGICAL HISTORY: Past Surgical History:  Procedure Laterality Date   ACHILLES TENDON REPAIR Left 2012   Ruptured   ATRIAL FIBRILLATION ABLATION N/A 11/24/2023   Procedure: ATRIAL FIBRILLATION ABLATION;  Surgeon: Inocencio Soyla Lunger, MD;  Location: [facility];  Service: Cardiovascular;  Laterality: N/A;   BASAL CELL CARCINOMA EXCISION  01/2023   right nose, back   BRONCHIAL BIOPSY  02/09/2022   Procedure: BRONCHIAL BIOPSIES;  Surgeon: Brenna Adine CROME, DO;  Location: [facility] ENDOS[facility];  Service: Pulmonary;;   BRONCHIAL NEEDLE ASPIRATION BIOPSY  02/09/2022   Procedure: BRONCHIAL NEEDLE ASPIRATION BIOPSIES;  Surgeon: Brenna Adine CROME, DO;  Location: [facility] ENDOS[facility];   Service: Pulmonary;;   COLONOSCOPY WITH PROPOFOL  N/A 12/11/2021   Procedure: COLONOSCOPY WITH PROPOFOL ;  Surgeon: Rollin Dover, MD;  Location: [facility] ENDOSCOPY;  Service: Gastroenterology;  Laterality: N/A;   HERNIA REPAIR     umbilical hernia   IR IMAGING GUIDED PORT INSERTION  02/18/2022   SKIN SURGERY Left    Basal cell carcinoma on left forearm   XI ROBOTIC ASSISTED LOWER ANTERIOR RESECTION N/A 01/09/2021   Procedure: XI ROBOTIC ASSISTED RIGHT HEMI COLECTOMY, LYSIS OF ADHESIONS, SMALL BOWEL RESECTION, INTRAOPERATIVE ASSESMENT OF PERFUSION, PARTIAL OMENTECTOMY;  Surgeon: Teresa Lonni CHRISTELLA, MD;  Location: [facility] ORS;  Service: General;  Laterality: N/A;    I have reviewed the social history and family history with the patient and they are unchanged from previous note.  ALLERGIES:  is allergic to furosemide  and tramadol .  MEDICATIONS:  Current Outpatient Medications  Medication Sig Dispense Refill   acetaminophen  (TYLENOL ) 500 MG tablet Take 500-1,000 mg by mouth every 6 (six) hours as needed (pain.). (Patient taking differently: Take 500-1,000 mg by mouth as needed (pain.).)     amiodarone  (PACERONE ) 200 MG tablet Take  200 mg by mouth daily.     amLODipine  (NORVASC ) 5 MG tablet Take 1 tablet (5 mg total) by mouth daily. 90 tablet 3   ascorbic acid  (VITAMIN C) 500 MG tablet Take 500 mg by mouth daily.     Calcium  Carbonate-Vit D-Min (CALCIUM  600+D3 PLUS MINERALS) 600-800 MG-UNIT TABS Take 1 tablet by mouth daily.     carvedilol  (COREG ) 12.5 MG tablet Take 1 tablet (12.5 mg total) by mouth 2 (two) times daily. 180 tablet 3   Cholecalciferol  (D3-1000) 25 MCG (1000 UT) capsule Take 1,000 Units by mouth daily.     diphenoxylate -atropine  (LOMOTIL ) 2.5-0.025 MG tablet Take 1 tablet by mouth 4 (four) times daily as needed for diarrhea or loose stools. (Patient taking differently: Take 1 tablet by mouth as needed for diarrhea or loose stools.) 30 tablet 3   ELIQUIS  5 MG TABS tablet Take 1 tablet (5  mg total) by mouth 2 (two) times daily. 180 tablet 1   lidocaine -prilocaine  (EMLA ) cream Apply 1 Application topically as needed (every 3 months).     Misc Natural Products (JOINT SUPPORT PO) Take 1 tablet by mouth daily. Instaflex Advanced Joint Support     nitroGLYCERIN  (NITROSTAT ) 0.4 MG SL tablet Place 1 tablet (0.4 mg total) under the tongue every 5 (five) minutes as needed. 25 tablet 5   potassium chloride  SA (KLOR-CON  M) 20 MEQ tablet Take 1 tablet (20 mEq total) by mouth daily. 30 tablet 1   sacubitril -valsartan  (ENTRESTO ) 97-103 MG Take 1 tablet by mouth 2 (two) times daily. 180 tablet 3   vitamin B-12 (CYANOCOBALAMIN ) 1000 MCG tablet Take 1,000 mcg by mouth every evening.     Zinc 50 MG CAPS Take 50 mg by mouth every evening.     No current facility-administered medications for this visit.   Facility-Administered Medications Ordered in Other Visits  Medication Dose Route Frequency Provider Last Rate Last Admin   sodium chloride  flush (NS) 0.9 % injection 10 mL  10 mL Intracatheter PRN [name], MD   10 mL at 11/19/22 0910    PHYSICAL EXAMINATION: ECOG PERFORMANCE STATUS: 0 - Asymptomatic  Vitals:   01/05/24 1048  BP: 138/70  Pulse: (!) 53  Resp: 16  Temp: 98.3 F (36.8 C)  SpO2: 98%   Wt Readings from Last 3 Encounters:  01/05/24 112.9 kg (248 lb 12.8 oz)  12/22/23 113.3 kg (249 lb 12.8 oz)  11/24/23 108.9 kg (240 lb)     GENERAL:alert, no distress and comfortable SKIN: skin color, texture, turgor are normal, no rashes or significant lesions EYES: normal, Conjunctiva are pink and non-injected, sclera clear NECK: supple, thyroid  normal size, non-tender, without nodularity LYMPH:  no palpable lymphadenopathy in the cervical, axillary  LUNGS: clear to auscultation and percussion with normal breathing effort HEART: regular rate & rhythm and no murmurs and no lower extremity edema ABDOMEN:abdomen soft, non-tender and normal bowel sounds Musculoskeletal:no cyanosis of  digits and no clubbing  NEURO: alert & oriented x 3 with fluent speech, no focal motor/sensory deficits  Physical Exam    LABORATORY DATA:  I have reviewed the data as listed    Latest Ref Rng & Units 12/29/2023   10:36 AM 10/27/2023   10:50 AM 09/19/2023   10:48 AM  CBC  WBC 4.0 - 10.5 K/uL 4.6  5.3  5.0   Hemoglobin 13.0 - 17.0 g/dL 85.2  84.5  84.5   Hematocrit 39.0 - 52.0 % 43.4  45.3  46.7   Platelets 150 -  400 K/uL 148  156  161         Latest Ref Rng & Units 12/29/2023   10:36 AM 10/27/2023   10:50 AM 09/19/2023   10:48 AM  CMP  Glucose 70 - 99 mg/dL 898  97  90   BUN 8 - 23 mg/dL 14  14  13    Creatinine 0.61 - 1.24 mg/dL 9.06  8.86  8.93   Sodium 135 - 145 mmol/L 141  141  140   Potassium 3.5 - 5.1 mmol/L 4.1  3.8  4.0   Chloride 98 - 111 mmol/L 107  108  108   CO2 22 - 32 mmol/L 30  30  28    Calcium  8.9 - 10.3 mg/dL 8.9  8.5  8.7   Total Protein 6.5 - 8.1 g/dL 6.5  6.5  6.9   Total Bilirubin 0.0 - 1.2 mg/dL 0.6  0.7  0.6   Alkaline Phos 38 - 126 U/L 48  57  55   AST 15 - 41 U/L 15  19  12    ALT 0 - 44 U/L 13  24  11        RADIOGRAPHIC STUDIES: I have personally reviewed the radiological images as listed and agreed with the findings in the report. No results found.    No orders of the defined types were placed in this encounter.  All questions were answered. The patient knows to call the clinic with any problems, questions or concerns. No barriers to learning was detected. The total time spent in the appointment was 40 minutes, including review of chart and various tests results, discussions about plan of care and coordination of care plan     Onita Mattock, MD 01/05/2024

## 2024-01-06 ENCOUNTER — Other Ambulatory Visit: Payer: Self-pay

## 2024-01-06 DIAGNOSIS — C182 Malignant neoplasm of ascending colon: Secondary | ICD-10-CM

## 2024-01-06 NOTE — Progress Notes (Signed)
 As per Dr. Lanny, placed order in portal for Signatera successfully, uploaded all paper work with the order, placed kit in lab to be drawn on appointed date, to be determined when he starts radiation.

## 2024-01-09 ENCOUNTER — Other Ambulatory Visit: Payer: Self-pay

## 2024-01-10 ENCOUNTER — Other Ambulatory Visit: Payer: Self-pay

## 2024-01-16 ENCOUNTER — Other Ambulatory Visit: Payer: Self-pay

## 2024-01-17 ENCOUNTER — Telehealth: Payer: Self-pay | Admitting: Radiation Oncology

## 2024-01-17 NOTE — Telephone Encounter (Signed)
 Called patient to schedule a consultation w. PA Donald. No answer, LVM for a return call.

## 2024-01-19 ENCOUNTER — Other Ambulatory Visit

## 2024-01-19 ENCOUNTER — Other Ambulatory Visit: Payer: Self-pay

## 2024-01-23 NOTE — Progress Notes (Signed)
 Radiation Oncology         [phone] ________________________________  Name: Thomas Knight        MRN: [id number]  Date of Service: 01/25/2024 DOB: 05-13-1946  RR:Wjanmd, Dene BROCKS, DO  Thomas Callander, MD     REFERRING PHYSICIAN: [name], MD   DIAGNOSIS: The primary encounter diagnosis was Cancer of right colon [facility]). A diagnosis of Malignant neoplasm metastatic to lung, unspecified laterality [facility]) was also pertinent to this visit.   HISTORY OF PRESENT ILLNESS: Thomas Knight is a 78 y.o. male seen at the request of Dr. Lanny with recurrent metastatic colon cancer involving the lung.  The patient was originally diagnosed in 2022 with a Stage IIA, pT3N0M0, adenocarcinoma of the right colon.  Following right hemicolectomy on 01/09/2021 he was being monitored in surveillance.  A lung nodule was noted on imaging in the right upper lobe in July 23 measuring 11 mm and an additional posterior peripheral right upper lobe nodule measuring 10 mm and a new 3 mm central right upper lobe nodule was also identified.  A PET scan on 02/04/2022 showed no hypermetabolic changes but identified the nodules seen, the SUV max was 1.81 and the largest lesion but the blood pool was 2.07.  A biopsy on 02/09/2022 showed adenocarcinoma with morphologic features consistent with colorectal primary.  He was treated with systemic therapy, and his last systemic dose of chemotherapy was on 12/08/2022.  CT imaging on 02/14/2023 showed slight increase in the 2 right upper lobe nodules, one measuring 7 mm in greatest dimension the other 1.1 cm.  So he was treated definitively with stereotactic body radiotherapy (SBRT).  He has been following closely with Dr. Lanny and CT imaging scans in December 2024 were reassuring showing posttreatment change including  post radiation fibrosis and no new metastatic disease.   A PET scan on 08/01/2023 showed linear focal hypermetabolism in the posterior right lower lobe and was felt to be artifactual.   Follow-up CT chest without on 10/27/2023 showed a left lower lobe nodule measuring 9 mm in retrospect increased from 6 mm in February 2024, and a 3 mm peripheral right middle lobe nodule that was new.  He had a repeat scan on 12/29/2023 that confirmed right subcarinal tissue measuring 1 cm was stable, and  the right middle lobe nodule increased in size to 5 mm and stability in a subpleural nodular opacity at the left lower lobe and persistent 9 mm left lower lobe nodule.  Given these findings he is seen to consider additional SBRT.   PREVIOUS RADIATION THERAPY: No  04/06/23-04/11/23: SBRT Treatment Plan Name: Lung_R_1_SBRT Site: Lung, RUL Technique: SBRT/SRT-IMRT Mode: Photon Dose Per Fraction: 18 Gy Prescribed Dose (Delivered / Prescribed): 54 Gy / 54 Gy Prescribed Fxs (Delivered / Prescribed): 3 / 3   Plan Name: Lung_R_2_SBRT Site: Lung, RUL Technique: SBRT/SRT-IMRT Mode: Photon Dose Per Fraction: 18 Gy Prescribed Dose (Delivered / Prescribed): 54 Gy / 54 Gy Prescribed Fxs (Delivered / Prescribed): 3 / 3  PAST MEDICAL HISTORY:  Past Medical History:  Diagnosis Date   Bilateral leg edema 08/24/2019   Cancer (HCC)    skin ca, colon   Chronic cough    Chronic pain of left knee 12/01/2015   COVID-19    06/2019   Dysrhythmia    afib  was cardioverted   Essential hypertension 08/24/2019   Mixed hyperlipidemia 08/24/2019   Obesity (BMI 30-39.9) 08/24/2019   Persistent atrial fibrillation (HCC) 08/24/2019  PAST SURGICAL HISTORY: Past Surgical History:  Procedure Laterality Date   ACHILLES TENDON REPAIR Left 2012   Ruptured   ATRIAL FIBRILLATION ABLATION N/A 11/24/2023   Procedure: ATRIAL FIBRILLATION ABLATION;  Surgeon: Inocencio Soyla Lunger, MD;  [facility]: [facility];  Service: Cardiovascular;  Laterality: N/A;   BASAL CELL CARCINOMA EXCISION  01/2023   right nose, back   BRONCHIAL BIOPSY  02/09/2022   Procedure: BRONCHIAL BIOPSIES;  Surgeon: Thomas Adine CROME, DO;   Location: [facility];  Service: Pulmonary;;   BRONCHIAL NEEDLE ASPIRATION BIOPSY  02/09/2022   Procedure: BRONCHIAL NEEDLE ASPIRATION BIOPSIES;  Surgeon: Thomas Adine CROME, DO;  Location: [facility];  Service: Pulmonary;;   COLONOSCOPY WITH PROPOFOL  N/A 12/11/2021   Procedure: COLONOSCOPY WITH PROPOFOL ;  Surgeon: Thomas Dover, MD;  Location: [facility];  Service: Gastroenterology;  Laterality: N/A;   HERNIA REPAIR     umbilical hernia   IR IMAGING GUIDED PORT INSERTION  02/18/2022   SKIN SURGERY Left    Basal cell carcinoma on left forearm   XI ROBOTIC ASSISTED LOWER ANTERIOR RESECTION N/A 01/09/2021   Procedure: XI ROBOTIC ASSISTED RIGHT HEMI COLECTOMY, LYSIS OF ADHESIONS, SMALL BOWEL RESECTION, INTRAOPERATIVE ASSESMENT OF PERFUSION, PARTIAL OMENTECTOMY;  Surgeon: Thomas Lonni HERO, MD;  Location: [facility] ORS;  Service: General;  Laterality: N/A;     FAMILY HISTORY:  Family History  Problem Relation Age of Onset   Lung cancer Father      SOCIAL HISTORY:  reports that he has never smoked. He has never used smokeless tobacco. He reports that he does not drink alcohol and does not use drugs. The patient is married and lives in [place]. 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 He's accompanied by his wife. ***   ALLERGIES: Furosemide  and Tramadol    MEDICATIONS:  Current Outpatient Medications  Medication Sig Dispense Refill   acetaminophen  (TYLENOL ) 500 MG tablet Take 500-1,000 mg by mouth every 6 (six) hours as needed (pain.). (Patient taking differently: Take 500-1,000 mg by mouth as needed (pain.).)     amiodarone  (PACERONE ) 200 MG tablet Take 200 mg by mouth daily.     amLODipine  (NORVASC ) 5 MG tablet Take 1 tablet (5 mg total) by mouth daily. 90 tablet 3   ascorbic acid  (VITAMIN C) 500 MG tablet Take 500 mg by mouth daily.     Calcium  Carbonate-Vit D-Min (CALCIUM  600+D3 PLUS MINERALS) 600-800 MG-UNIT TABS Take 1 tablet by mouth  daily.     carvedilol  (COREG ) 12.5 MG tablet Take 1 tablet (12.5 mg total) by mouth 2 (two) times daily. 180 tablet 3   Cholecalciferol  (D3-1000) 25 MCG (1000 UT) capsule Take 1,000 Units by mouth daily.     diphenoxylate -atropine  (LOMOTIL ) 2.5-0.025 MG tablet Take 1 tablet by mouth 4 (four) times daily as needed for diarrhea or loose stools. (Patient taking differently: Take 1 tablet by mouth as needed for diarrhea or loose stools.) 30 tablet 3   ELIQUIS  5 MG TABS tablet Take 1 tablet (5 mg total) by mouth 2 (two) times daily. 180 tablet 1   lidocaine -prilocaine  (EMLA ) cream Apply 1 Application topically as needed (every 3 months).     Misc Natural Products (JOINT SUPPORT PO) Take 1 tablet by mouth daily. Instaflex Advanced Joint Support     nitroGLYCERIN  (NITROSTAT ) 0.4 MG SL tablet Place 1 tablet (0.4 mg total) under the tongue every 5 (five) minutes as needed. 25 tablet 5   potassium chloride  SA (KLOR-CON  M) 20 MEQ tablet  Take 1 tablet (20 mEq total) by mouth daily. 30 tablet 1   sacubitril -valsartan  (ENTRESTO ) 97-103 MG Take 1 tablet by mouth 2 (two) times daily. 180 tablet 3   vitamin B-12 (CYANOCOBALAMIN ) 1000 MCG tablet Take 1,000 mcg by mouth every evening.     Zinc 50 MG CAPS Take 50 mg by mouth every evening.     No current facility-administered medications for this visit.   Facility-Administered Medications Ordered in Other Visits  Medication Dose Route Frequency Provider Last Rate Last Admin   sodium chloride  flush (NS) 0.9 % injection 10 mL  10 mL Intracatheter PRN Thomas Callander, MD   10 mL at 11/19/22 0910     REVIEW OF SYSTEMS: On review of systems, the patient reports that he ***  PHYSICAL EXAM:  Wt Readings from Last 3 Encounters:  01/05/24 248 lb 12.8 oz (112.9 kg)  12/22/23 249 lb 12.8 oz (113.3 kg)  11/24/23 240 lb (108.9 kg)   Temp Readings from Last 3 Encounters:  01/05/24 98.3 F (36.8 C) (Temporal)  11/24/23 98.4 F (36.9 C)  11/03/23 98.4 F (36.9 C)  (Temporal)   BP Readings from Last 3 Encounters:  01/05/24 138/70  12/22/23 (!) 144/80  11/24/23 133/74   Pulse Readings from Last 3 Encounters:  01/05/24 (!) 53  12/22/23 (!) 52  11/24/23 (!) 57  Repeat BP was 176/98   /10  In general this is a well appearing Caucasian male in no acute distress. He's alert and oriented x4 and appropriate throughout the examination. Cardiopulmonary assessment is negative for acute distress and he exhibits normal effort.     ECOG = 1  0 - Asymptomatic (Fully active, able to carry on all predisease activities without restriction)  1 - Symptomatic but completely ambulatory (Restricted in physically strenuous activity but ambulatory and able to carry out work of a light or sedentary nature. For example, light housework, office work)  2 - Symptomatic, <50% in bed during the day (Ambulatory and capable of all self care but unable to carry out any work activities. Up and about more than 50% of waking hours)  3 - Symptomatic, >50% in bed, but not bedbound (Capable of only limited self-care, confined to bed or chair 50% or more of waking hours)  4 - Bedbound (Completely disabled. Cannot carry on any self-care. Totally confined to bed or chair)  5 - Death   Raylene MM, Creech RH, Tormey DC, et al. [phone]). Toxicity and response criteria of the [facility]. Am. DOROTHA Bridges. Oncol. 5 (6): 649-55    LABORATORY DATA:  Lab Results  Component Value Date   WBC 4.6 12/29/2023   HGB 14.7 12/29/2023   HCT 43.4 12/29/2023   MCV 93.7 12/29/2023   PLT 148 (L) 12/29/2023   Lab Results  Component Value Date   NA 141 12/29/2023   K 4.1 12/29/2023   CL 107 12/29/2023   CO2 30 12/29/2023   Lab Results  Component Value Date   ALT 13 12/29/2023   AST 15 12/29/2023   ALKPHOS 48 12/29/2023   BILITOT 0.6 12/29/2023      RADIOGRAPHY: CT Chest Wo Contrast Result Date: 01/01/2024 CLINICAL DATA:  Colon cancer, stage IV. Colon cancer  metastasized to lung. EXAM: CT CHEST WITHOUT CONTRAST TECHNIQUE: Multidetector CT imaging of the chest was performed following the standard protocol without IV contrast. RADIATION DOSE REDUCTION: This exam was performed according to the departmental dose-optimization program which includes automated exposure control, adjustment of the  mA and/or kV according to patient size and/or use of iterative reconstruction technique. COMPARISON:  CT chest 10/27/2023 and thyroid  ultrasound 02/09/2021 FINDINGS: Cardiovascular: Ascending thoracic aorta measures approximately 3.8 cm. Coronary artery calcifications. Heart size is normal. Right jugular Port-A-Cath with the tip in the SVC. Mediastinum/Nodes: Right subcarinal tissue on image 78/2 measures 1.0 cm in the short axis and stable. No significant mediastinal lymph node enlargement. 2.1 cm low-density nodule in left thyroid  lobe corresponds with previous thyroid  ultrasound findings. No axillary lymph node enlargement. Lungs/Pleura: Trachea and mainstem bronchi are patent. Bronchiectasis in both lungs. Architecture distortion with bronchiectasis in the medial right lung and right hilum with patchy parenchymal densities. Findings are similar to the previous examination and most compatible with post treatment changes. Nodule in the periphery of the right lung on image 56/8 measures 5 mm and previously measured 3 mm. This nodule appears to be in the right middle lobe. Subtle patchy ground-glass opacities in the left upper lobe are minimally changed. Subtle subpleural nodular opacity in the left lower lobe superior segment on image 58/8 has not significantly changed. Nodule in the left lower lobe on image 96/8 measures 9 mm and not significantly changed from the recent comparison. No pleural effusions. Upper Abdomen: Postsurgical bowel changes in the anterior abdomen. No acute abnormality in the visualized upper abdomen. Stable 1.0 cm hypodensity in the anterior aspect of the  liver. Musculoskeletal: No acute bone abnormality. No suspicious osseous lesions. IMPRESSION: 1. Mild enlargement of a right middle lobe pulmonary nodule. Nodule measures 5 mm and previously measured 3 mm. Findings are concerning for an enlarging metastatic pulmonary nodule. 2. Stable 9 mm nodule in the left lower lobe. This nodule remains suspicious. 3. Post treatment changes in the right lung. 4. Aortic Atherosclerosis (ICD10-I70.0). Electronically Signed   By: Juliene Balder M.D.   On: 01/01/2024 13:19       IMPRESSION/PLAN: 1. Recurrent Metastatic Stage IIA, pT3N0M0, adenocarcinoma of the right colon involving the lungs.  Dr. Dewey discusses the recent imaging and focuses primarily on the right middle lobe nodule and left lower lobe nodule.  He offers a course of stereotactic body radiotherapy (SBRT) to the RML and LLL targets. We reviewed the risks, benefits, short, and long term effects of radiotherapy, as well as the locally curative though overall palliative intent, and the patient is interested in proceeding. Dr. Dewey discusses the delivery and logistics of radiotherapy and anticipates a course of ***  In a visit lasting 60 minutes, greater than 50% of the time was spent face to face discussing the patient's condition, in preparation for the discussion, and coordinating the patient's care.   The above documentation reflects my direct findings during this shared patient visit. Please see the separate note by Dr. Dewey on this date for the remainder of the patient's plan of care.    [name] Husband, [place]   **Disclaimer: This note was dictated with voice recognition software. Similar sounding words can inadvertently be transcribed and this note may contain transcription errors which may not have been corrected upon publication of note.**

## 2024-01-24 NOTE — Progress Notes (Incomplete)
 Thoracic Location of Tumor / Histology: Metastatic colon cancer with RT lung involvement   Patient presented with lung metastasis confirmed in RUL by bronchoscopy on 02/09/22.   Biopsies of RT lung (if applicable) revealed:  FINAL MICROSCOPIC DIAGNOSIS: 02/09/2022  A. LUNG, RUL, FINE NEEDLE ASPIRATION  BIOPSY FORCEPS:  Adenocarcinoma with morphologic features consistent with metastasis from  a colorectal primary.  Please see comment.  Comment: The following immunostains are performed with appropriate  controls :  TTF-1: Negative.  Napsin A: Negative.  CK7: Negative.  CK20: Positive.  CDX2: Positive .  The above immuno histochemical profile is supportive of the rendered  diagnosis.  This case was also reviewed by Dr. Belvie who concurs with the above  interpretation.   Past/Anticipated interventions by pulmonary, if any:  Past/Anticipated interventions by cardiothoracic surgery, if any: Inocencio Soyla Lunger, MD  11/24/2023 Procedure Laterality Anesthesia  ATRIAL FIBRILLATION ABLATION N/A General    Past/Anticipated interventions by medical oncology, if any: Dr. Lanny 01/05/2024 Assessment & Plan Metastatic colon cancer with lung involvement Recent CT scan shows growth in nodules in the left lower lobe and right middle lobe of the lung, 5-52mm size. The left lower lobe nodule has grown slightly and appears more solid, raising suspicion for cancer. The right middle lobe nodule is very small but also concerning for cancer. Previous radiation treatment has resulted in chronic inflammation and scarring in the lungs. Blood test ([facility]) was negative, indicating negative circulating tumor cells  - We reviewed his imaging in our thoracic tumor conference this morning.  Due to the slowly increased size of lung nodule, especially from last December 2024, and appearance of the nodules, we feel those are highly suspicious for lung metastasis.  I will refer him to Dr. Dewey for radiation treatment  consultation. He agrees.  We also discussed the option of ablation by pulmonologist, however due to his anticoagulation for recent atrial fibrillation ablation, he cannot do invasive procedure in the next 2 to 3 months. - Schedule Signatera blood test on the same day as Dr. Smitty consultation. If it's positive, will f/u in future  - Proceed with radiation treatment for the left lung nodule and possibly the right lung nodule. - Follow up with CT scan three months post-radiation treatment. Radiation-induced lung changes Chronic inflammation and scarring in the lungs due to previous radiation treatment, causing cough and wheezing, particularly when lying down. No evidence of bronchitis on CT scan. Atrial fibrillation, post-ablation Atrial fibrillation status post-ablation on Nov 24, 2023. Currently in normal rhythm. On blood thinner (Eliquis ) which cannot be discontinued due to heart condition. - Continue Eliquis  as prescribed. - Follow up with cardiologist as scheduled. Peripheral edema Mild peripheral edema, possibly related to decreased water intake. - Increase water intake. Plan - He is clinically doing very well, asymptomatic except mild cough - I personally reviewed his restaging CT images and compared to previous scans, we will refer him to radiation oncologist Dr. Dewey to consider SBRT treatment.  It was reviewed this morning in thoracic tumor conference. - Will obtain Signatera in the next week - Lab and follow-up in 6 weeks.   Tobacco/Marijuana/Snuff/ETOH use: No  Signs/Symptoms Weight changes, if any: {:18581} Respiratory complaints, if any: Mild cough Hemoptysis, if any: {:18581} Pain issues, if any:  {:18581}  SAFETY ISSUES: Prior radiation? Yes-First Treatment Date: 2023-04-06 - Last Treatment Date: 2023-04-11 Plan Name: Lung_R_1_SBRT Site: Lung, Right Pacemaker/ICD? no  Possible current pregnancy?no (Pt is male) Is the patient on methotrexate? no  Current  Complaints  / other details:  ***  Vitals: ***   This concludes the interaction.  Rosaline Minerva, LPN

## 2024-01-25 ENCOUNTER — Encounter: Payer: Self-pay | Admitting: Radiation Oncology

## 2024-01-25 ENCOUNTER — Ambulatory Visit
Admission: RE | Admit: 2024-01-25 | Discharge: 2024-01-25 | Disposition: A | Discharge status: Home / Self Care | Source: Ambulatory Visit | Attending: Radiation Oncology | Admitting: Radiation Oncology

## 2024-01-25 ENCOUNTER — Inpatient Hospital Stay

## 2024-01-25 VITALS — BP 165/80 | HR 62 | Temp 98.2°F | Resp 19 | Ht 71.0 in | Wt 248.0 lb

## 2024-01-25 DIAGNOSIS — Z85828 Personal history of other malignant neoplasm of skin: Secondary | ICD-10-CM | POA: Insufficient documentation

## 2024-01-25 DIAGNOSIS — Z801 Family history of malignant neoplasm of trachea, bronchus and lung: Secondary | ICD-10-CM | POA: Insufficient documentation

## 2024-01-25 DIAGNOSIS — Z7901 Long term (current) use of anticoagulants: Secondary | ICD-10-CM | POA: Diagnosis not present

## 2024-01-25 DIAGNOSIS — C189 Malignant neoplasm of colon, unspecified: Secondary | ICD-10-CM | POA: Diagnosis not present

## 2024-01-25 DIAGNOSIS — C182 Malignant neoplasm of ascending colon: Secondary | ICD-10-CM

## 2024-01-25 DIAGNOSIS — I4819 Other persistent atrial fibrillation: Secondary | ICD-10-CM | POA: Insufficient documentation

## 2024-01-25 DIAGNOSIS — C7801 Secondary malignant neoplasm of right lung: Secondary | ICD-10-CM | POA: Diagnosis not present

## 2024-01-25 DIAGNOSIS — R600 Localized edema: Secondary | ICD-10-CM | POA: Diagnosis not present

## 2024-01-25 DIAGNOSIS — Z923 Personal history of irradiation: Secondary | ICD-10-CM | POA: Diagnosis not present

## 2024-01-25 DIAGNOSIS — I10 Essential (primary) hypertension: Secondary | ICD-10-CM | POA: Diagnosis not present

## 2024-01-25 DIAGNOSIS — Z452 Encounter for adjustment and management of vascular access device: Secondary | ICD-10-CM | POA: Diagnosis not present

## 2024-01-25 DIAGNOSIS — I700 Atherosclerosis of aorta: Secondary | ICD-10-CM | POA: Diagnosis not present

## 2024-01-25 DIAGNOSIS — Z8616 Personal history of COVID-19: Secondary | ICD-10-CM | POA: Diagnosis not present

## 2024-01-25 DIAGNOSIS — E669 Obesity, unspecified: Secondary | ICD-10-CM | POA: Diagnosis not present

## 2024-01-25 DIAGNOSIS — C184 Malignant neoplasm of transverse colon: Secondary | ICD-10-CM | POA: Insufficient documentation

## 2024-01-25 DIAGNOSIS — I4891 Unspecified atrial fibrillation: Secondary | ICD-10-CM | POA: Diagnosis not present

## 2024-01-25 DIAGNOSIS — E782 Mixed hyperlipidemia: Secondary | ICD-10-CM | POA: Insufficient documentation

## 2024-01-25 DIAGNOSIS — R918 Other nonspecific abnormal finding of lung field: Secondary | ICD-10-CM | POA: Diagnosis not present

## 2024-01-25 DIAGNOSIS — Z79899 Other long term (current) drug therapy: Secondary | ICD-10-CM | POA: Diagnosis not present

## 2024-01-25 DIAGNOSIS — G8929 Other chronic pain: Secondary | ICD-10-CM | POA: Insufficient documentation

## 2024-01-25 DIAGNOSIS — C7800 Secondary malignant neoplasm of unspecified lung: Secondary | ICD-10-CM

## 2024-01-25 LAB — COMPREHENSIVE METABOLIC PANEL WITH GFR
ALT: 12 U/L (ref 0–44)
AST: 16 U/L (ref 15–41)
Albumin: 3.9 g/dL (ref 3.5–5.0)
Alkaline Phosphatase: 49 U/L (ref 38–126)
Anion gap: 3 — ABNORMAL LOW (ref 5–15)
BUN: 13 mg/dL (ref 8–23)
CO2: 32 mmol/L (ref 22–32)
Calcium: 9 mg/dL (ref 8.9–10.3)
Chloride: 106 mmol/L (ref 98–111)
Creatinine, Ser: 1.08 mg/dL (ref 0.61–1.24)
GFR, Estimated: >60 mL/min (ref >60)
Glucose, Bld: 98 mg/dL (ref 70–99)
Potassium: 4.3 mmol/L (ref 3.5–5.1)
Sodium: 141 mmol/L (ref 135–145)
Total Bilirubin: 0.6 mg/dL (ref 0.0–1.2)
Total Protein: 6.9 g/dL (ref 6.5–8.1)

## 2024-01-25 LAB — CBC WITH DIFFERENTIAL/PLATELET
Abs Immature Granulocytes: 0.01 K/uL (ref 0.00–0.07)
Basophils Absolute: 0.1 K/uL (ref 0.0–0.1)
Basophils Relative: 1 %
Eosinophils Absolute: 0.1 K/uL (ref 0.0–0.5)
Eosinophils Relative: 2 %
HCT: 45.6 % (ref 39.0–52.0)
Hemoglobin: 15.2 g/dL (ref 13.0–17.0)
Immature Granulocytes: 0 %
Lymphocytes Relative: 23 %
Lymphs Abs: 1.2 K/uL (ref 0.7–4.0)
MCH: 32.1 pg (ref 26.0–34.0)
MCHC: 33.3 g/dL (ref 30.0–36.0)
MCV: 96.4 fL (ref 80.0–100.0)
Monocytes Absolute: 0.6 K/uL (ref 0.1–1.0)
Monocytes Relative: 11 %
Neutro Abs: 3.4 K/uL (ref 1.7–7.7)
Neutrophils Relative %: 63 %
Platelets: 155 K/uL (ref 150–400)
RBC: 4.73 MIL/uL (ref 4.22–5.81)
RDW: 15 % (ref 11.5–15.5)
WBC: 5.4 K/uL (ref 4.0–10.5)
nRBC: 0 % (ref 0.0–0.2)

## 2024-01-25 LAB — GENETIC SCREENING ORDER

## 2024-01-25 LAB — CEA (ACCESS): CEA (CHCC): 2.83 ng/mL (ref 0.00–5.00)

## 2024-01-25 NOTE — Addendum Note (Signed)
 Encounter addended by: Margrette Camelia HERO, LPN on: 2/69/7974 2:02 PM  Actions taken: Visit diagnoses modified

## 2024-01-26 DIAGNOSIS — C189 Malignant neoplasm of colon, unspecified: Secondary | ICD-10-CM | POA: Diagnosis not present

## 2024-01-26 DIAGNOSIS — C7801 Secondary malignant neoplasm of right lung: Secondary | ICD-10-CM | POA: Diagnosis not present

## 2024-01-30 ENCOUNTER — Ambulatory Visit
Admission: RE | Admit: 2024-01-30 | Discharge: 2024-01-30 | Disposition: A | Discharge status: Home / Self Care | Source: Ambulatory Visit | Attending: Radiation Oncology | Admitting: Radiation Oncology

## 2024-01-30 DIAGNOSIS — C7801 Secondary malignant neoplasm of right lung: Secondary | ICD-10-CM | POA: Diagnosis not present

## 2024-01-30 DIAGNOSIS — Z510 Encounter for antineoplastic radiation therapy: Secondary | ICD-10-CM | POA: Insufficient documentation

## 2024-01-30 DIAGNOSIS — C182 Malignant neoplasm of ascending colon: Secondary | ICD-10-CM | POA: Diagnosis not present

## 2024-01-30 DIAGNOSIS — C189 Malignant neoplasm of colon, unspecified: Secondary | ICD-10-CM | POA: Diagnosis not present

## 2024-02-06 ENCOUNTER — Telehealth: Payer: Self-pay | Admitting: Cardiology

## 2024-02-06 DIAGNOSIS — J209 Acute bronchitis, unspecified: Secondary | ICD-10-CM | POA: Diagnosis not present

## 2024-02-06 DIAGNOSIS — Z6836 Body mass index (BMI) 36.0-36.9, adult: Secondary | ICD-10-CM | POA: Diagnosis not present

## 2024-02-06 NOTE — Telephone Encounter (Signed)
 Pt c/o swelling/edema: STAT if pt has developed SOB within 24 hours  If swelling, where is the swelling located? Legs  How much weight have you gained and in what time span? Ankles  Have you gained 2 pounds in a day or 5 pounds in a week? N/A  Do you have a log of your daily weights (if so, list)? No  Are you currently taking a fluid pill? No  Are you currently SOB? No  Have you traveled recently in a car or plane for an extended period of time?

## 2024-02-06 NOTE — Telephone Encounter (Signed)
 Pt reports swelling in tops of feet and ankles. The last 2 weeks he has noticed swelling and it is hard to get his dress shoes on. Swelling goes down at night, gets worse after getting up- throughout the day.  Maybe some mild shortness of breath with activity. Also, becomes fatigued quicker.   Done with Chemo over a year- in June.   Getting ready to go back on radiation this Thursday.    Pt reports that he is allergic to lasix . He reports that he no longer takes Potassium- taken off of list.   Has visit with AF clinic 9/2  He does try to elevate legs throughout the day. Not wearing compression socks, but will get some. Instructed to wear during day, but not at night. Asked about salt/sodium intake- he reports he does not use a lot.   Informed that I would send this information to his provider and be back in touch with any recommendations.SABRA

## 2024-02-08 DIAGNOSIS — C189 Malignant neoplasm of colon, unspecified: Secondary | ICD-10-CM | POA: Diagnosis not present

## 2024-02-08 DIAGNOSIS — C7801 Secondary malignant neoplasm of right lung: Secondary | ICD-10-CM | POA: Diagnosis not present

## 2024-02-08 DIAGNOSIS — Z510 Encounter for antineoplastic radiation therapy: Secondary | ICD-10-CM | POA: Diagnosis not present

## 2024-02-08 DIAGNOSIS — C182 Malignant neoplasm of ascending colon: Secondary | ICD-10-CM | POA: Diagnosis not present

## 2024-02-09 ENCOUNTER — Ambulatory Visit
Admission: RE | Admit: 2024-02-09 | Discharge: 2024-02-09 | Disposition: A | Discharge status: Home / Self Care | Source: Ambulatory Visit | Attending: Radiation Oncology | Admitting: Radiation Oncology

## 2024-02-09 ENCOUNTER — Other Ambulatory Visit: Payer: Self-pay

## 2024-02-09 DIAGNOSIS — C7801 Secondary malignant neoplasm of right lung: Secondary | ICD-10-CM | POA: Diagnosis not present

## 2024-02-09 DIAGNOSIS — C182 Malignant neoplasm of ascending colon: Secondary | ICD-10-CM | POA: Diagnosis not present

## 2024-02-09 DIAGNOSIS — Z510 Encounter for antineoplastic radiation therapy: Secondary | ICD-10-CM | POA: Diagnosis not present

## 2024-02-09 LAB — RAD ONC ARIA SESSION SUMMARY
Course Elapsed Days: 0
Plan Fractions Treated to Date: 1
Plan Fractions Treated to Date: 1
Plan Prescribed Dose Per Fraction: 18 Gy
Plan Prescribed Dose Per Fraction: 18 Gy
Plan Total Fractions Prescribed: 3
Plan Total Fractions Prescribed: 3
Plan Total Prescribed Dose: 54 Gy
Plan Total Prescribed Dose: 54 Gy
Reference Point Dosage Given to Date: 18 Gy
Reference Point Dosage Given to Date: 18 Gy
Reference Point Session Dosage Given: 18 Gy
Reference Point Session Dosage Given: 18 Gy
Session Number: 1

## 2024-02-10 ENCOUNTER — Ambulatory Visit: Admitting: Radiation Oncology

## 2024-02-11 LAB — SIGNATERA
SIGNATERA MTM READOUT: 0 MTM/ml
SIGNATERA TEST RESULT: NEGATIVE

## 2024-02-13 ENCOUNTER — Other Ambulatory Visit: Payer: Self-pay

## 2024-02-13 ENCOUNTER — Ambulatory Visit
Admission: RE | Admit: 2024-02-13 | Discharge: 2024-02-13 | Disposition: A | Discharge status: Home / Self Care | Source: Ambulatory Visit | Attending: Radiation Oncology | Admitting: Radiation Oncology

## 2024-02-13 DIAGNOSIS — Z510 Encounter for antineoplastic radiation therapy: Secondary | ICD-10-CM | POA: Diagnosis not present

## 2024-02-13 DIAGNOSIS — C182 Malignant neoplasm of ascending colon: Secondary | ICD-10-CM | POA: Diagnosis not present

## 2024-02-13 DIAGNOSIS — C7801 Secondary malignant neoplasm of right lung: Secondary | ICD-10-CM | POA: Diagnosis not present

## 2024-02-13 LAB — RAD ONC ARIA SESSION SUMMARY
Course Elapsed Days: 4
Plan Fractions Treated to Date: 2
Plan Fractions Treated to Date: 2
Plan Prescribed Dose Per Fraction: 18 Gy
Plan Prescribed Dose Per Fraction: 18 Gy
Plan Total Fractions Prescribed: 3
Plan Total Fractions Prescribed: 3
Plan Total Prescribed Dose: 54 Gy
Plan Total Prescribed Dose: 54 Gy
Reference Point Dosage Given to Date: 36 Gy
Reference Point Dosage Given to Date: 36 Gy
Reference Point Session Dosage Given: 18 Gy
Reference Point Session Dosage Given: 18 Gy
Session Number: 2

## 2024-02-14 ENCOUNTER — Ambulatory Visit: Admitting: Radiation Oncology

## 2024-02-15 ENCOUNTER — Ambulatory Visit
Admission: RE | Admit: 2024-02-15 | Discharge: 2024-02-15 | Disposition: A | Discharge status: Home / Self Care | Source: Ambulatory Visit | Attending: Radiation Oncology | Admitting: Radiation Oncology

## 2024-02-15 ENCOUNTER — Other Ambulatory Visit: Payer: Self-pay

## 2024-02-15 ENCOUNTER — Ambulatory Visit
Admission: RE | Admit: 2024-02-15 | Discharge: 2024-02-15 | Disposition: A | Discharge status: Home / Self Care | Source: Ambulatory Visit | Attending: Radiation Oncology

## 2024-02-15 DIAGNOSIS — C182 Malignant neoplasm of ascending colon: Secondary | ICD-10-CM | POA: Diagnosis not present

## 2024-02-15 DIAGNOSIS — Z510 Encounter for antineoplastic radiation therapy: Secondary | ICD-10-CM | POA: Diagnosis not present

## 2024-02-15 DIAGNOSIS — C189 Malignant neoplasm of colon, unspecified: Secondary | ICD-10-CM | POA: Diagnosis not present

## 2024-02-15 DIAGNOSIS — C7801 Secondary malignant neoplasm of right lung: Secondary | ICD-10-CM | POA: Diagnosis not present

## 2024-02-15 LAB — RAD ONC ARIA SESSION SUMMARY
Course Elapsed Days: 6
Plan Fractions Treated to Date: 3
Plan Fractions Treated to Date: 3
Plan Prescribed Dose Per Fraction: 18 Gy
Plan Prescribed Dose Per Fraction: 18 Gy
Plan Total Fractions Prescribed: 3
Plan Total Fractions Prescribed: 3
Plan Total Prescribed Dose: 54 Gy
Plan Total Prescribed Dose: 54 Gy
Reference Point Dosage Given to Date: 54 Gy
Reference Point Dosage Given to Date: 54 Gy
Reference Point Session Dosage Given: 18 Gy
Reference Point Session Dosage Given: 18 Gy
Session Number: 3

## 2024-02-15 NOTE — Progress Notes (Signed)
  Radiation Oncology         [phone] ________________________________  Name: Thomas Knight MRN: [id number]  Date: 02/15/2024  DOB: 09-Mar-1946  End of Treatment Note  Diagnosis:   Recurrent Metastatic Stage IIA, pT3N0M0, adenocarcinoma of the right colon involving the lungs   Indication for treatment:  Curative       Radiation treatment dates:   02/09/24-02/15/24  Site/dose:   The tumor in the LLL, RML were treated with a course of stereotactic body radiation treatment. The patient received 54 Gy In 3 fractions at 18 G per fraction.  Narrative: The patient tolerated radiation treatment relatively well.   The patient did not have any signs of acute toxicity during treatment. He has noticed lower extremity edema and persistent hypertension.   Plan: The patient has completed radiation treatment. The patient will receive a call in about one month from the radiation oncology department. He will continue follow up with Dr. Lanny as well. He is going to follow up with his PCP and cardiology team to ask about discontinuing his amlodipine .     [name] Husband, PAC

## 2024-02-16 ENCOUNTER — Encounter: Payer: Self-pay | Admitting: Hematology

## 2024-02-16 ENCOUNTER — Inpatient Hospital Stay: Attending: Physician Assistant

## 2024-02-16 ENCOUNTER — Inpatient Hospital Stay (HOSPITAL_BASED_OUTPATIENT_CLINIC_OR_DEPARTMENT_OTHER): Admitting: Hematology

## 2024-02-16 VITALS — BP 114/60 | HR 54 | Temp 98.4°F | Resp 17 | Wt 254.5 lb

## 2024-02-16 DIAGNOSIS — C7801 Secondary malignant neoplasm of right lung: Secondary | ICD-10-CM

## 2024-02-16 DIAGNOSIS — C7800 Secondary malignant neoplasm of unspecified lung: Secondary | ICD-10-CM | POA: Insufficient documentation

## 2024-02-16 DIAGNOSIS — Z923 Personal history of irradiation: Secondary | ICD-10-CM | POA: Insufficient documentation

## 2024-02-16 DIAGNOSIS — Z79899 Other long term (current) drug therapy: Secondary | ICD-10-CM | POA: Insufficient documentation

## 2024-02-16 DIAGNOSIS — R0609 Other forms of dyspnea: Secondary | ICD-10-CM | POA: Diagnosis not present

## 2024-02-16 DIAGNOSIS — C182 Malignant neoplasm of ascending colon: Secondary | ICD-10-CM

## 2024-02-16 DIAGNOSIS — R062 Wheezing: Secondary | ICD-10-CM | POA: Diagnosis not present

## 2024-02-16 DIAGNOSIS — E860 Dehydration: Secondary | ICD-10-CM

## 2024-02-16 LAB — COMPREHENSIVE METABOLIC PANEL WITH GFR
ALT: 10 U/L (ref 0–44)
AST: 12 U/L — ABNORMAL LOW (ref 15–41)
Albumin: 3.8 g/dL (ref 3.5–5.0)
Alkaline Phosphatase: 47 U/L (ref 38–126)
Anion gap: 3 — ABNORMAL LOW (ref 5–15)
BUN: 16 mg/dL (ref 8–23)
CO2: 30 mmol/L (ref 22–32)
Calcium: 8.4 mg/dL — ABNORMAL LOW (ref 8.9–10.3)
Chloride: 106 mmol/L (ref 98–111)
Creatinine, Ser: 0.97 mg/dL (ref 0.61–1.24)
GFR, Estimated: >60 mL/min (ref >60)
Glucose, Bld: 102 mg/dL — ABNORMAL HIGH (ref 70–99)
Potassium: 4.2 mmol/L (ref 3.5–5.1)
Sodium: 139 mmol/L (ref 135–145)
Total Bilirubin: 0.6 mg/dL (ref 0.0–1.2)
Total Protein: 6.3 g/dL — ABNORMAL LOW (ref 6.5–8.1)

## 2024-02-16 LAB — CBC WITH DIFFERENTIAL/PLATELET
Abs Immature Granulocytes: 0.03 K/uL (ref 0.00–0.07)
Basophils Absolute: 0.1 K/uL (ref 0.0–0.1)
Basophils Relative: 1 %
Eosinophils Absolute: 0.2 K/uL (ref 0.0–0.5)
Eosinophils Relative: 2 %
HCT: 42.6 % (ref 39.0–52.0)
Hemoglobin: 14.5 g/dL (ref 13.0–17.0)
Immature Granulocytes: 1 %
Lymphocytes Relative: 9 %
Lymphs Abs: 0.6 K/uL — ABNORMAL LOW (ref 0.7–4.0)
MCH: 32.3 pg (ref 26.0–34.0)
MCHC: 34 g/dL (ref 30.0–36.0)
MCV: 94.9 fL (ref 80.0–100.0)
Monocytes Absolute: 0.6 K/uL (ref 0.1–1.0)
Monocytes Relative: 10 %
Neutro Abs: 5 K/uL (ref 1.7–7.7)
Neutrophils Relative %: 77 %
Platelets: 141 K/uL — ABNORMAL LOW (ref 150–400)
RBC: 4.49 MIL/uL (ref 4.22–5.81)
RDW: 15.2 % (ref 11.5–15.5)
WBC: 6.4 K/uL (ref 4.0–10.5)
nRBC: 0 % (ref 0.0–0.2)

## 2024-02-16 LAB — CEA (ACCESS): CEA (CHCC): 2.98 ng/mL (ref 0.00–5.00)

## 2024-02-16 MED ORDER — SODIUM CHLORIDE 0.9% FLUSH
10.0000 mL | Freq: Once | INTRAVENOUS | Status: AC | PRN
  Administered 2024-02-16: 10 mL

## 2024-02-16 NOTE — Assessment & Plan Note (Signed)
-  stage II (pT3, N0), metastatic disease to lung and possible nodes in July 2023, MSS, KRAS/NRAS/BRAF wild type   -found on screening colonoscopy. S/p right hemicolectomy 01/09/21. -lung metastasis confirmed in RUL by bronchoscopy on 02/09/22. -[facility] showed MSS, low tumor burden, KRAS/NRAS/BRAF wild-type, no other targetable mutations. The benefit of EGFR inhibitor is much less in right-sided colon cancer, so I will not give it as first line but may consider in subsequent lines.  -he started FOLFOX on 03/03/22. He tolerated well overall with diarrhea for several days. Bevacizumab  added with C2 (9/20).  -CT scan 05/26/2022 showed improved lung mets, but showed new infiltrative lung change, possible related to his recent URI -repeated CT chest on 07/26/2022 showed stable known lung mets (overall excellent response, with minimum residual dsease) and resolved previously see inflammatory change in RLL -I have changed treatment to maintenance xeloda  and beva every 3 weeks, he started C1 on 08/18/2022.  -due to recurrent diarrhea, I stopped Xeloda  after 2 cycles -we have changed his treatment to 5-fu pump infusion and beva every 2 weeks on 4/24, he tolerated reasonably well, diarrhea has resolved now. -Restaging CT scan yesterday showed stable appearance of previously scattered small pulmonary nodules, measuring 4-45mm, no new lesions.  Overall has much improved compared to his initial CT scan from July 2023, and it is a hard to tell if this is residual disease versus scar tissue.  -We stopped his chemo after last scan and he is on cancer surveillance now. -Personally reviewed his follow-up CT scan from February 14, 2023, which showed slightly increase the size of the 2 nodule in the right lung, most consistent with disease progression.  No other new metastasis. -He completed SBRT on 04/11/2023, and second course in 01/2024 to LUL and RML lung nodules

## 2024-02-16 NOTE — Radiation Completion Notes (Signed)
 Patient Name: Thomas Knight, Thomas Knight MRN: [id number] Date of Birth: 1946/06/18 Referring Physician: ONITA MATTOCK, M.D. Date of Service: 2024-02-16 Radiation Oncologist: Adina Barge, M.D. [facility] [place]                             RADIATION ONCOLOGY END OF TREATMENT NOTE     Diagnosis: C78.01 Secondary malignant neoplasm of right lung Staging on 2021-01-09: Cancer of right colon (HCC) T=pT3, N=pN0, M=cM0 Intent: Curative     ==========DELIVERED PLANS==========  First Treatment Date: 2024-02-09 Last Treatment Date: 2024-02-15   Plan Name: Lung_RML_SBRT Site: Lung, Right Technique: SBRT/SRT-IMRT Mode: Photon Dose Per Fraction: 18 Gy Prescribed Dose (Delivered / Prescribed): 54 Gy / 54 Gy Prescribed Fxs (Delivered / Prescribed): 3 / 3   Plan Name: Lung_LLL_SBRT Site: Lung, Left Technique: SBRT/SRT-IMRT Mode: Photon Dose Per Fraction: 18 Gy Prescribed Dose (Delivered / Prescribed): 54 Gy / 54 Gy Prescribed Fxs (Delivered / Prescribed): 3 / 3     ==========ON TREATMENT VISIT DATES========== 2024-02-09, 2024-02-09, 2024-02-13, 2024-02-13, 2024-02-15, 2024-02-15, 2024-02-15     ==========UPCOMING VISITS========== 05/14/2024 [facility] EST PT 20 MATTOCK ONITA, MD  05/07/2024 [facility] PORT FLUSH W/LAB [facility] FL[facility]  03/26/2024 [facility] PORT FLUSH W/LAB [facility]  02/28/2024 [facility] OFFICE VISIT 30 Terra Fairy JINNY DEVONNA  02/21/2024 [facility] OFFICE VISIT Carlin Delon BROCKS, NP  02/16/2024 [facility] EST PT 20 MATTOCK ONITA, MD  02/16/2024 [facility] PORT FLUSH W/LAB [facility]        ==========APPENDIX - ON TREATMENT VISIT NOTES==========   See weekly On Treatment Notes in Epic for details in the Media tab (listed as Progress notes on the On Treatment Visit Dates listed above).

## 2024-02-16 NOTE — Progress Notes (Signed)
 [name]   [phone] [phone]) [phone]   Clinic Follow up Note   Patient Care Team: Conley Dene BROCKS, DO as PCP - General (Family Medicine) Sheena Pugh, DO as PCP - Cardiology (Cardiology) Inocencio Soyla Lunger, MD as PCP - Electrophysiology (Cardiology) [name], MD as Attending Physician (Hematology and Oncology)  Date of Service:  02/16/2024  CHIEF COMPLAINT: f/u of metastatic right colon cancer  CURRENT THERAPY:  Observation  Oncology History   Cancer of right colon [facility]) -stage II (pT3, N0), metastatic disease to lung and possible nodes in July 2023, MSS, KRAS/NRAS/BRAF wild type   -found on screening colonoscopy. S/p right hemicolectomy 01/09/21. -lung metastasis confirmed in RUL by bronchoscopy on 02/09/22. -FoundationOne showed MSS, low tumor burden, KRAS/NRAS/BRAF wild-type, no other targetable mutations. The benefit of EGFR inhibitor is much less in right-sided colon cancer, so I will not give it as first line but may consider in subsequent lines.  -he started FOLFOX on 03/03/22. He tolerated well overall with diarrhea for several days. Bevacizumab  added with C2 (9/20).  -CT scan 05/26/2022 showed improved lung mets, but showed new infiltrative lung change, possible related to his recent URI -repeated CT chest on 07/26/2022 showed stable known lung mets (overall excellent response, with minimum residual dsease) and resolved previously see inflammatory change in RLL -I have changed treatment to maintenance xeloda  and beva every 3 weeks, he started C1 on 08/18/2022.  -due to recurrent diarrhea, I stopped Xeloda  after 2 cycles -we have changed his treatment to 5-fu pump infusion and beva every 2 weeks on 4/24, he tolerated reasonably well, diarrhea has resolved now. -Restaging CT scan yesterday showed stable appearance of previously scattered small pulmonary nodules, measuring 4-36mm, no new lesions.  Overall has much improved compared to his  initial CT scan from July 2023, and it is a hard to tell if this is residual disease versus scar tissue.  -We stopped his chemo after last scan and he is on cancer surveillance now. -Personally reviewed his follow-up CT scan from February 14, 2023, which showed slightly increase the size of the 2 nodule in the right lung, most consistent with disease progression.  No other new metastasis. -He completed SBRT on 04/11/2023, and second course in 01/2024 to LUL and RML lung nodules   Assessment & Plan Metastatic colon cancer to lung, status post recent lung radiation Completed lung radiation therapy without complications. Blood counts are well-managed, with a slightly low platelet count but not concerning. Kidney and liver functions are normal. Recent Signatera test returned negative, indicating no detectable circulating tumor DNA, though not 100% sensitive. Negative result is a positive sign, but residual disease may still be present below detectable levels. - Order CT scan in three months - Repeat Signatera test in three months - Schedule follow-up appointment in three months - Perform lab work and CT scan one week prior to follow-up appointment  Wheezing likely secondary to recent lung radiation Mild intermittent wheezing, particularly when lying down, possibly related to recent lung radiation causing inflammation. Inhaler provides relief. Differential includes cardiac-related wheezing due to heart disease, as wheezing worsens when lying flat and improves with elevation. - Use inhaler as needed for wheezing - Elevate head with two pillows when sleeping to assess improvement - Discuss wheezing with cardiologist at upcoming appointment  Bilateral lower extremity edema Swelling in both ankles and feet, possibly related to amlodipine  use. Other potential causes include heart-related issues, as swelling is associated with difficulty wearing dress shoes and  improves with elevation and compression  stockings. - Continue using compression stockings - Elevate feet regularly - Reduce salt intake - Increase water, fruit, and vegetable intake - Discuss potential medication adjustment with cardiologist at upcoming appointment   Plan - Follow-up in 3 months with lab and CT chest, abdomen pelvis with contrast 1 week before - Lab and port flush in 6 weeks, including Signatera  SUMMARY OF ONCOLOGIC HISTORY: Oncology History Overview Note   Cancer Staging  Cancer of right colon [facility]) Staging form: Colon and Rectum, AJCC 8th Edition - Pathologic stage from 01/09/2021: Stage IIA (pT3, pN0, cM0) - Signed by [name], MD on 02/12/2022 Total positive nodes: 0 Histologic grading system: 4 grade system Histologic grade (G): G2 Residual tumor (R): R0 - None     Cancer of right colon (HCC)  10/2020 Procedure   Colonoscopy-Dr. Rollin     11/06/2020 Imaging   CT CHEST ABDOMEN PELVIS W CONTRAST   IMPRESSION: 1. 4.6 cm partially circumferential apple-core type neoplastic process involving the mid transverse colon. No findings for locoregional adenopathy or distant metastatic disease. 2. Enlarged prostate gland with median lobe hypertrophy impressing on the base of the bladder. 3. 16 mm left thyroid  nodule. Recommend follow-up thyroid  ultrasound examination.   01/09/2021 Initial Diagnosis   Colon cancer (HCC)   01/09/2021 Pathology Results   B. COLON, RIGHT, RESECTION:  -  Adenocarcinoma, moderately differentiated, 3.0 cm  -  No carcinoma identified in seventeen lymph nodes (0/17)  -  Margins uninvolved by carcinoma  -  Tubular adenoma (x2)  -  Benign appendix  -  See oncology table and comment below   Margin Status for Invasive Carcinoma: All margins negative for  invasive carcinoma       Distance from Invasive Carcinoma to Radial (Circumferential)       Margin Status for Non-Invasive Tumor: All margins negative for  high-grade dysplasia / intramucosal carcinoma and low-grade  dysplasia  Regional Lymph Nodes:       Number of Lymph Nodes with Tumor: 0       Number of Lymph Nodes Examined: 17  Tumor Deposits: 0  Distant Metastasis:       Distant Site(s) Involved: Not applicable  Pathologic Stage Classification (pTNM, AJCC 8th Edition): pT3, pN0   MMR Stable    01/09/2021 Cancer Staging   Staging form: Colon and Rectum, AJCC 8th Edition - Pathologic stage from 01/09/2021: Stage IIA (pT3, pN0, cM0) - Signed by [name], MD on 02/12/2022 Total positive nodes: 0 Histologic grading system: 4 grade system Histologic grade (G): G2 Residual tumor (R): R0 - None   05/2021 Tumor Marker   Patient's tumor was tested for the following markers: CEA. Results of the tumor marker test revealed 2.1.   12/11/2021 Procedure   Colonoscopy-Dr. Rollin  There was evidence of a prior functional end-to-end ileo-colonic anastomosis in the transverse colon. This was patent and was characterized by healthy appearing mucosa. The anastomosis was traversed. Findings: Scattered small and large-mouthed diverticula were found in the sigmoid colon. - Patent functional end-to-end ileo-colonic anastomosis, characterized by healthy appearing mucosa. - Diverticulosis in the sigmoid colon. - No specimens collected.   01/25/2022 Imaging   CT CHEST ABDOMEN PELVIS W CONTRAST   IMPRESSION: 1. New right-sided pulmonary nodules measure up to 11 mm, nonspecific but concerning for metastatic disease. Consider further evaluation with nuclear medicine PET/CT. 2. New nodularity/lymph nodes in the central mesentery, concerning for nodal disease involvement. 3. Slightly increased size of prominent retroperitoneal  lymph nodes, nonspecific but also somewhat concerning for nodal disease involvement. 4. Surgical change of partial colectomy without evidence of local recurrence. 5. Stable prominent mediastinal lymph nodes, nonspecific but stability is reassuring. Attention on follow-up imaging  suggested. 6. Stable prominent right external iliac lymph node, nonspecific but stability is reassuring. Attention on follow-up imaging suggested. 7. Stable hypodense 8 mm hepatic lesion technically too small to accurately characterize but stability is reassuring. Attention on follow-up imaging suggested. 8. 1.9 cm incidental left thyroid  nodule. Recommend thyroid  US . Reference: J Am Coll Radiol. 2015 Feb;12(2): 143-50 9.  Aortic Atherosclerosis (ICD10-I70.0).   02/09/2022 Procedure   Bronchoscopy under the care of Dr. Brenna    02/09/2022 Pathology Results   FINAL MICROSCOPIC DIAGNOSIS:   A. LUNG, RUL, FINE NEEDLE ASPIRATION  BIOPSY FORCEPS:  Adenocarcinoma with morphologic features consistent with metastasis from a colorectal primary.  Please see comment.   Comment: The following immunostains are performed with appropriate controls :  TTF-1: Negative .  Napsin A: Negative .  CK7: Negative.  CK20: Positive.  CDX2: Positive .   The above immuno histochemical profile is supportive of the rendered diagnosis.    08/18/2022 - 08/18/2022 Chemotherapy   Patient is on Treatment Plan : COLORECTAL Bevacizumab  q14d     Metastasis to lung [facility])  02/09/2022 Pathology Results   FINAL MICROSCOPIC DIAGNOSIS:   A. LUNG, RUL, FINE NEEDLE ASPIRATION  BIOPSY FORCEPS:  Adenocarcinoma with morphologic features consistent with metastasis from a colorectal primary.  Please see comment.   Comment: The following immunostains are performed with appropriate controls :  TTF-1: Negative .  Napsin A: Negative .  CK7: Negative.  CK20: Positive.  CDX2: Positive .   The above immuno histochemical profile is supportive of the rendered diagnosis.    02/12/2022 Initial Diagnosis   Colon cancer metastasized to lung (HCC)   03/03/2022 - 07/31/2022 Chemotherapy   Patient is on Treatment Plan : COLORECTAL FOLFOX + Bevacizumab  q14d     07/26/2022 Imaging    IMPRESSION: 1. Near-complete interval resolution of  nodular and masslike areas of irregular consolidation, likely resolving infection or organizing pneumonia/drug reaction. 2. Additional previously seen pulmonary nodules are stable, consistent with treated pulmonary metastatic disease. 3. Mild peribronchovascular reticular opacities which are most pronounced in the mid and lower lungs, similar to prior exam, progressed when compared with prior dated January 15, 2022. Findings can be seen in the setting of interstitial lung disease, pattern is most consistent with fibrotic NSIP. 4. Aortic Atherosclerosis (ICD10-I70.0).   07/26/2022 Imaging    IMPRESSION: 1. Near-complete interval resolution of nodular and masslike areas of irregular consolidation, likely resolving infection or organizing pneumonia/drug reaction. 2. Additional previously seen pulmonary nodules are stable, consistent with treated pulmonary metastatic disease. 3. Mild peribronchovascular reticular opacities which are most pronounced in the mid and lower lungs, similar to prior exam, progressed when compared with prior dated January 15, 2022. Findings can be seen in the setting of interstitial lung disease, pattern is most consistent with fibrotic NSIP. 4. Aortic Atherosclerosis (ICD10-I70.0).   09/08/2022 - 09/08/2022 Chemotherapy   Patient is on Treatment Plan : COLORECTAL Bevacizumab  q21d     10/20/2022 -  Chemotherapy   Patient is on Treatment Plan : COLORECTAL 5FU + Leucovorin  (Modified [place]Gramont) + Bevacizumab  q14d        Discussed the use of AI scribe software for clinical note transcription with the patient, who gave verbal consent to proceed.  History of Present Illness Thomas Knight is a  78 year old male with metastatic colon cancer to the lung who presents for follow-up. He is accompanied by his wife, Mrs. Gendreau, and his daughter is listening in via phone.  He recently completed a course of radiation therapy without issues during the sessions. However, he  developed a severe cough last week, which did not occur during the radiation sessions. His recent Signatera test was negative.  He has bilateral ankle and foot swelling, which he manages with compression stockings, foot elevation, reduced salt intake, increased water consumption, and a diet rich in fruits and vegetables. He is concerned that amlodipine  may contribute to the swelling.  He experiences wheezing, particularly when lying down at night, which has worsened over the past week or two. He finds relief using an inhaler. No history of smoking.     All other systems were reviewed with the patient and are negative.  MEDICAL HISTORY:  Past Medical History:  Diagnosis Date   Bilateral leg edema 08/24/2019   Cancer (HCC)    skin ca, colon   Chronic cough    Chronic pain of left knee 12/01/2015   COVID-19    06/2019   Dysrhythmia    afib  was cardioverted   Essential hypertension 08/24/2019   Mixed hyperlipidemia 08/24/2019   Obesity (BMI 30-39.9) 08/24/2019   Persistent atrial fibrillation (HCC) 08/24/2019    SURGICAL HISTORY: Past Surgical History:  Procedure Laterality Date   ACHILLES TENDON REPAIR Left 2012   Ruptured   ATRIAL FIBRILLATION ABLATION N/A 11/24/2023   Procedure: ATRIAL FIBRILLATION ABLATION;  Surgeon: Inocencio Soyla Lunger, MD;  Location: [facility];  Service: Cardiovascular;  Laterality: N/A;   BASAL CELL CARCINOMA EXCISION  01/2023   right nose, back   BRONCHIAL BIOPSY  02/09/2022   Procedure: BRONCHIAL BIOPSIES;  Surgeon: Brenna Adine CROME, DO;  Location: [facility] ENDOSCOPY;  Service: Pulmonary;;   BRONCHIAL NEEDLE ASPIRATION BIOPSY  02/09/2022   Procedure: BRONCHIAL NEEDLE ASPIRATION BIOPSIES;  Surgeon: Brenna Adine CROME, DO;  Location: [facility] ENDOSCOPY;  Service: Pulmonary;;   COLONOSCOPY WITH PROPOFOL  N/A 12/11/2021   Procedure: COLONOSCOPY WITH PROPOFOL ;  Surgeon: Rollin Dover, MD;  Location: [facility] ENDOSCOPY;  Service: Gastroenterology;  Laterality: N/A;    HERNIA REPAIR     umbilical hernia   IR IMAGING GUIDED PORT INSERTION  02/18/2022   SKIN SURGERY Left    Basal cell carcinoma on left forearm   XI ROBOTIC ASSISTED LOWER ANTERIOR RESECTION N/A 01/09/2021   Procedure: XI ROBOTIC ASSISTED RIGHT HEMI COLECTOMY, LYSIS OF ADHESIONS, SMALL BOWEL RESECTION, INTRAOPERATIVE ASSESMENT OF PERFUSION, PARTIAL OMENTECTOMY;  Surgeon: Teresa Lonni HERO, MD;  Location: [facility] ORS;  Service: General;  Laterality: N/A;    I have reviewed the social history and family history with the patient and they are unchanged from previous note.  ALLERGIES:  is allergic to furosemide  and tramadol .  MEDICATIONS:  Current Outpatient Medications  Medication Sig Dispense Refill   acetaminophen  (TYLENOL ) 500 MG tablet Take 500-1,000 mg by mouth every 6 (six) hours as needed (pain.). (Patient taking differently: Take 500-1,000 mg by mouth as needed (pain.).)     amiodarone  (PACERONE ) 200 MG tablet Take 200 mg by mouth daily.     amLODipine  (NORVASC ) 5 MG tablet Take 1 tablet (5 mg total) by mouth daily. 90 tablet 3   ascorbic acid  (VITAMIN C) 500 MG tablet Take 500 mg by mouth daily.     Calcium  Carbonate-Vit D-Min (CALCIUM  600+D3 PLUS MINERALS) 600-800 MG-UNIT TABS Take 1 tablet by mouth  daily.     carvedilol  (COREG ) 12.5 MG tablet Take 1 tablet (12.5 mg total) by mouth 2 (two) times daily. 180 tablet 3   Cholecalciferol  (D3-1000) 25 MCG (1000 UT) capsule Take 1,000 Units by mouth daily.     diphenoxylate -atropine  (LOMOTIL ) 2.5-0.025 MG tablet Take 1 tablet by mouth 4 (four) times daily as needed for diarrhea or loose stools. (Patient taking differently: Take 1 tablet by mouth as needed for diarrhea or loose stools.) 30 tablet 3   ELIQUIS  5 MG TABS tablet Take 1 tablet (5 mg total) by mouth 2 (two) times daily. 180 tablet 1   lidocaine -prilocaine  (EMLA ) cream Apply 1 Application topically as needed (every 3 months).     Misc Natural Products (JOINT SUPPORT PO) Take 1 tablet by  mouth daily. Instaflex Advanced Joint Support     nitroGLYCERIN  (NITROSTAT ) 0.4 MG SL tablet Place 1 tablet (0.4 mg total) under the tongue every 5 (five) minutes as needed. 25 tablet 5   sacubitril -valsartan  (ENTRESTO ) 97-103 MG Take 1 tablet by mouth 2 (two) times daily. 180 tablet 3   vitamin B-12 (CYANOCOBALAMIN ) 1000 MCG tablet Take 1,000 mcg by mouth every evening.     Zinc 50 MG CAPS Take 50 mg by mouth every evening.     No current facility-administered medications for this visit.   Facility-Administered Medications Ordered in Other Visits  Medication Dose Route Frequency Provider Last Rate Last Admin   sodium chloride  flush (NS) 0.9 % injection 10 mL  10 mL Intracatheter PRN [name], MD   10 mL at 11/19/22 0910    PHYSICAL EXAMINATION: ECOG PERFORMANCE STATUS: 1 - Symptomatic but completely ambulatory  Vitals:   02/16/24 1051  BP: 114/60  Pulse: (!) 54  Resp: 17  Temp: 98.4 F (36.9 C)  SpO2: 96%   Wt Readings from Last 3 Encounters:  02/16/24 254 lb 8 oz (115.4 kg)  01/25/24 248 lb (112.5 kg)  01/05/24 248 lb 12.8 oz (112.9 kg)     GENERAL:alert, no distress and comfortable SKIN: skin color, texture, turgor are normal, no rashes or significant lesions EYES: normal, Conjunctiva are pink and non-injected, sclera clear NECK: supple, thyroid  normal size, non-tender, without nodularity LYMPH:  no palpable lymphadenopathy in the cervical, axillary  LUNGS: clear to auscultation and percussion with normal breathing effort HEART: regular rate & rhythm and no murmurs and no lower extremity edema ABDOMEN:abdomen soft, non-tender and normal bowel sounds Musculoskeletal:no cyanosis of digits and no clubbing  NEURO: alert & oriented x 3 with fluent speech, no focal motor/sensory deficits  Physical Exam VITALS: BP- 156/78 CHEST: Lungs clear to auscultation bilaterally. EXTREMITIES: Bilateral ankle and dorsal foot edema.  LABORATORY DATA:  I have reviewed the data as  listed    Latest Ref Rng & Units 02/16/2024   10:30 AM 01/25/2024    1:25 PM 12/29/2023   10:36 AM  CBC  WBC 4.0 - 10.5 K/uL 6.4  5.4  4.6   Hemoglobin 13.0 - 17.0 g/dL 85.4  84.7  85.2   Hematocrit 39.0 - 52.0 % 42.6  45.6  43.4   Platelets 150 - 400 K/uL 141  155  148         Latest Ref Rng & Units 02/16/2024   10:30 AM 01/25/2024    1:25 PM 12/29/2023   10:36 AM  CMP  Glucose 70 - 99 mg/dL 897  98  898   BUN 8 - 23 mg/dL 16  13  14    Creatinine  0.61 - 1.24 mg/dL 9.02  8.91  9.06   Sodium 135 - 145 mmol/L 139  141  141   Potassium 3.5 - 5.1 mmol/L 4.2  4.3  4.1   Chloride 98 - 111 mmol/L 106  106  107   CO2 22 - 32 mmol/L 30  32  30   Calcium  8.9 - 10.3 mg/dL 8.4  9.0  8.9   Total Protein 6.5 - 8.1 g/dL 6.3  6.9  6.5   Total Bilirubin 0.0 - 1.2 mg/dL 0.6  0.6  0.6   Alkaline Phos 38 - 126 U/L 47  49  48   AST 15 - 41 U/L 12  16  15    ALT 0 - 44 U/L 10  12  13        RADIOGRAPHIC STUDIES: I have personally reviewed the radiological images as listed and agreed with the findings in the report. No results found.    Orders Placed This Encounter  Procedures   CT CHEST ABDOMEN PELVIS W CONTRAST    Standing Status:   Future    Expected Date:   05/09/2024    Expiration Date:   02/15/2025    If indicated for the ordered procedure, I authorize the administration of contrast media per Radiology protocol:   Yes    Does the patient have a contrast media/X-ray dye allergy?:   No    Preferred imaging location?:   [facility]    If indicated for the ordered procedure, I authorize the administration of oral contrast media per Radiology protocol:   Yes   All questions were answered. The patient knows to call the clinic with any problems, questions or concerns. No barriers to learning was detected. The total time spent in the appointment was 30 minutes, including review of chart and various tests results, discussions about plan of care and coordination of care plan     Onita Mattock,  MD 02/16/2024

## 2024-02-17 ENCOUNTER — Other Ambulatory Visit: Payer: Self-pay

## 2024-02-17 ENCOUNTER — Ambulatory Visit: Admitting: Radiation Oncology

## 2024-02-20 ENCOUNTER — Ambulatory Visit: Admitting: Radiation Oncology

## 2024-02-20 NOTE — Progress Notes (Unsigned)
 Cardiology Office Note   Date:  02/23/2024  ID:  KEELAN TRIPODI, DOB April 12, 1946, MRN [id number] PCP: Conley Dene BROCKS, DO  [facility] Providers Cardiologist:  Dub Huntsman, DO Electrophysiologist:  Will Gladis Norton, MD     History of Present Illness Thomas Knight is a 78 y.o. male with a past medical history of atrial fibrillation, HFpEF, CAD, hypertension, history of lung cancer, OSA, history of colon cancer, dyslipidemia, PFO.  11/24/2023 atrial fibrillation ablation 11/07/2023 cardiac CT calcium  score of 106, 29th percentile, PFO with left-to-right shunt 06/30/2023 echo EF 55 to 60%, mild concentric LVH, GLS abnormal, LA mildly dilated, mild dilatation of descending aorta 35 mm 06/02/2023 monitor atrial fibrillation occurred continuously, rates were well-controlled 02/25/2020 coronary CTA calcium  score of 12, 13th percentile  He is a longstanding patient of Dr. Huntsman for the management of his atrial fibrillation.  With several cardioversions that were ultimately unsuccessful.  He had a coronary CTA in 2021 revealing mild nonobstructive CAD.  He underwent a cardiac CT in 2025 revealed a calcium  score of 106, PFO was noted with left-to-right shunt, this was in preparation for atrial fibrillation ablation that occurred on 11/24/2023.  He presents today with concerns of pedal edema and shortness of breath that have been worsening over the last 3 weeks.  He is a very active individual, is also been bothered by fatigue.  He apparently has also been diagnosed with upper respiratory type symptoms, he was on prednisone as well as antibiotic, currently wheezing and was given an albuterol inhaler as well.  He has been wearing compression socks his pedal edema is not as pronounced today for him as it has been. He denies chest pain, palpitations, pnd, orthopnea, n, v, dizziness, syncope, weight gain, or early satiety.        ROS: Review of Systems  Constitutional:  Positive for  malaise/fatigue.  Respiratory:  Positive for shortness of breath and wheezing.   Cardiovascular:  Positive for leg swelling.  All other systems reviewed and are negative.    Studies Reviewed      Cardiac Studies & Procedures   ______________________________________________________________________________________________     ECHOCARDIOGRAM  ECHOCARDIOGRAM COMPLETE 06/30/2023  Narrative ECHOCARDIOGRAM REPORT    Patient Name:   Thomas Knight Date of Exam: 06/30/2023 Medical Rec #:  [id number]     Height:       71.0 in Accession #:    [id number]    Weight:       234.4 lb Date of Birth:  December 22, 1945     BSA:          2.256 m Patient Age:    77 years      BP:           160/88 mmHg Patient Gender: M             HR:           60 bpm. Exam Location:  [facility]  Procedure: 2D Echo, Cardiac Doppler, Color Doppler and Strain Analysis  Indications:    Depressed left ventricular ejection fraction [R09.89 (ICD-10-CM)]  History:        Patient has prior history of Echocardiogram examinations, most recent 12/22/2022. CHF, CAD, Arrythmias:Atrial Fibrillation; Signs/Symptoms:Shortness of Breath.  Sonographer:    Lynwood Silvas RDCS Referring Phys: [id number] KARDIE TOBB  IMPRESSIONS   1. Left ventricular ejection fraction, by estimation, is 55 to 60%. The left ventricle has normal function. The left ventricle has no regional wall motion abnormalities. There is mild  concentric left ventricular hypertrophy. Left ventricular diastolic parameters are indeterminate. The average left ventricular global longitudinal strain is 16.1 %. The global longitudinal strain is abnormal. 2. Right ventricular systolic function is normal. The right ventricular size is normal. There is normal pulmonary artery systolic pressure. 3. Left atrial size was mildly dilated. 4. The mitral valve is normal in structure. No evidence of mitral valve regurgitation. No evidence of mitral stenosis. 5. The aortic valve is tricuspid.  Aortic valve regurgitation is not visualized. No aortic stenosis is present. 6. There is mild dilatation of the descending aorta, measuring 35 mm. 7. The inferior vena cava is normal in size with greater than 50% respiratory variability, suggesting right atrial pressure of 3 mmHg.  FINDINGS Left Ventricle: Left ventricular ejection fraction, by estimation, is 55 to 60%. The left ventricle has normal function. The left ventricle has no regional wall motion abnormalities. The average left ventricular global longitudinal strain is 16.1 %. The global longitudinal strain is abnormal. The left ventricular internal cavity size was normal in size. There is mild concentric left ventricular hypertrophy. Left ventricular diastolic parameters are indeterminate. Indeterminate filling pressures.  Right Ventricle: The right ventricular size is normal. No increase in right ventricular wall thickness. Right ventricular systolic function is normal. There is normal pulmonary artery systolic pressure. The tricuspid regurgitant velocity is 2.20 m/s, and with an assumed right atrial pressure of 3 mmHg, the estimated right ventricular systolic pressure is 22.4 mmHg.  Left Atrium: Left atrial size was mildly dilated.  Right Atrium: Right atrial size was normal in size.  Pericardium: There is no evidence of pericardial effusion.  Mitral Valve: The mitral valve is normal in structure. No evidence of mitral valve regurgitation. No evidence of mitral valve stenosis.  Tricuspid Valve: The tricuspid valve is normal in structure. Tricuspid valve regurgitation is mild . No evidence of tricuspid stenosis.  Aortic Valve: The aortic valve is tricuspid. Aortic valve regurgitation is not visualized. No aortic stenosis is present.  Pulmonic Valve: The pulmonic valve was normal in structure. Pulmonic valve regurgitation is not visualized. No evidence of pulmonic stenosis.  Aorta: The aortic root is normal in size and structure  and the ascending aorta was not well visualized. There is mild dilatation of the descending aorta, measuring 35 mm.  Venous: A normal flow pattern is recorded from the right upper pulmonary vein. The inferior vena cava is normal in size with greater than 50% respiratory variability, suggesting right atrial pressure of 3 mmHg.  IAS/Shunts: No atrial level shunt detected by color flow Doppler.   LEFT VENTRICLE PLAX 2D LVIDd:         4.20 cm   Diastology LVIDs:         2.80 cm   LV e' medial:    9.14 cm/s LV PW:         1.20 cm   LV E/e' medial:  12.9 LV IVS:        1.20 cm   LV e' lateral:   10.10 cm/s LVOT diam:     2.00 cm   LV E/e' lateral: 11.7 LV SV:         56 LV SV Index:   25        2D Longitudinal Strain LVOT Area:     3.14 cm  2D Strain GLS Avg:     16.1 %   RIGHT VENTRICLE             IVC RV Basal diam:  3.50  cm     IVC diam: 1.70 cm RV S prime:     13.70 cm/s  LEFT ATRIUM           Index        RIGHT ATRIUM           Index LA diam:      4.30 cm 1.91 cm/m   RA Area:     22.40 cm LA Vol (A2C): 85.4 ml 37.86 ml/m  RA Volume:   69.90 ml  30.99 ml/m LA Vol (A4C): 82.3 ml 36.49 ml/m AORTIC VALVE LVOT Vmax:   87.17 cm/s LVOT Vmean:  57.833 cm/s LVOT VTI:    0.178 m  AORTA Ao Root diam: 3.40 cm Ao Asc diam:  3.50 cm Ao Desc diam: 3.50 cm  MV E velocity: 118.00 cm/s  TRICUSPID VALVE MV A velocity: 117.00 cm/s  TR Peak grad:   19.4 mmHg MV E/A ratio:  1.01         TR Vmax:        220.00 cm/s  SHUNTS Systemic VTI:  0.18 m Systemic Diam: 2.00 cm  Redell Leiter MD Electronically signed by Redell Leiter MD Signature Date/Time: 06/30/2023/5:09:36 PM    Final    MONITORS  LONG TERM MONITOR (3-14 DAYS) 06/30/2023  Narrative Patch Wear Time:  13 days and 23 hours (2024-12-12T10:48:47-0500 to 2024-12-26T10:48:31-0500)  Atrial Fibrillation occurred continuously (100% burden), ranging from 34-132 bpm (avg of 64 bpm). 1 Pause occurred lasting 3.4 secs (18 bpm).  Isolated VEs were rare (<1.0%), VE Couplets were rare (<1.0%), and no VE Triplets were present. MD notification criteria for Slow Atrial Fibrillation met - report posted prior to notification per account request (TY).  Conclusion: This study showed evidence of continuous atrial fibrillation.  There was 3.4-second pause which occurred overnight suspected patient was sleeping at this time.  This pause occurred in the setting of atrial fibrillation.  Currently there is no indication for pacemaker at this time.  He will benefit from being evaluated by our EP colleagues as well.   CT SCANS  CT CORONARY MORPH W/CTA COR W/SCORE 02/25/2020  Addendum 02/25/2020  5:00 PM ADDENDUM REPORT: 02/25/2020 12:51  ADDENDUM: OVER-READ INTERPRETATION  CT CHEST  The following report is an over-read performed by radiologist Dr. Rockey Jacquet [facility] on 02/25/2020. This over-read does not include interpretation of cardiac or coronary anatomy or pathology. The coronary CTA interpretation by the cardiologist is attached.  COMPARISON:  07/08/2019 chest graph  FINDINGS:  No pleural fluid.  Clear imaged lungs.  Normal aortic caliber. Tortuous thoracic aorta. No central pulmonary embolism, on this non-dedicated study. No imaged thoracic adenopathy.  Hepatic steatosis.  Normal imaged portions of the spleen, stomach.  No acute osseous abnormality.  IMPRESSION:  No acute findings in the imaged extracardiac chest.   Electronically Signed By: Rockey Kilts M.D. On: 02/25/2020 12:51  Narrative EXAM: Cardiac/Coronary  CT  TECHNIQUE: The patient was scanned on a [employer].  FINDINGS: A 120 kV prospective scan was triggered in the descending thoracic aorta at 111 HU's. Axial non-contrast 3 mm slices were carried out through the heart. The data set was analyzed on a dedicated work station and scored using the Agatson method. Gantry rotation speed was 250 msecs and  collimation was .6 mm. No beta blockade and 0.8 mg of sl NTG was given. The 3D data set was reconstructed in 5% intervals of the 67-82 % of the R-R cycle. Diastolic phases were analyzed  on a dedicated work station using MPR, MIP and VRT modes. The patient received 80 cc of contrast.  Aorta: Normal size. Scattered calcifications in the aortic arch. No dissection.  Aortic Valve:  Trileaflet.  No calcifications.  Coronary Arteries:  Normal coronary origin.  Left dominance.  RCA is a small non-dominant artery that give rise to an acute RVM branch. There is no plaque.  Left main is a short artery that gives rise to LAD and LCX arteries. There is no plaque.  LAD is a large vessel that gives rise to a large branching diagonal. There is minimal calcified plaque in the proximal and mid LAD with associated stenosis of 0-24%.  LCX is a dominant artery that gives rise to one large OM1 branch and PDA. There is no plaque.  Other findings:  Normal pulmonary vein drainage into the left atrium.  Normal let atrial appendage without a thrombus.  Normal size of the pulmonary artery.  IMPRESSION: 1. Coronary calcium  score of 12. This was 13th percentile for age and sex matched control.  2.  Normal coronary origin with right dominance.  3.  Minimal atherosclerosis.  CAD RADS 1.  4.  Consider non-atherosclerotic causes of chest pain.  5.  Recommend preventive therapy and risk factor modification.  Wilbert Turner  Electronically Signed: By: Wilbert Bihari On: 02/25/2020 11:38     ______________________________________________________________________________________________      Risk Assessment/Calculations  CHA2DS2-VASc Score = 5   This indicates a 7.2% annual risk of stroke. The patient's score is based upon: CHF History: 1 HTN History: 1 Diabetes History: 0 Stroke History: 0 Vascular Disease History: 1 Age Score: 2 Gender Score: 0         Physical Exam VS:  BP 110/60    Pulse (!) 58   Ht 5' 11 (1.803 m)   Wt 250 lb (113.4 kg)   SpO2 95%   BMI 34.87 kg/m        Wt Readings from Last 3 Encounters:  02/21/24 250 lb (113.4 kg)  02/16/24 254 lb 8 oz (115.4 kg)  01/25/24 248 lb (112.5 kg)    GEN: Well nourished, well developed in no acute distress NECK: No JVD; No carotid bruits CARDIAC: RRR, no murmurs, rubs, gallops RESPIRATORY:  +wheezing and rhonchi ABDOMEN: Soft, non-tender, non-distended EXTREMITIES:  +1 pitting edema mid tibia; No deformity   ASSESSMENT AND PLAN Paroxysmal atrial fibrillation/hypercoagulable state-s/p ablation earlier this year and he is maintaining sinus rhythm.  He will see the A-fib clinic next week and he mentions they have plans on stopping his amiodarone .  Continue Eliquis  5 mg twice daily--no indication for dose reduction and he is tolerating this without any adverse bleeding effects, CHA2DS2-VASc score of 5.  Continue Coreg  12.5 mg twice daily.  HFpEF-NYHA class II for shortness of breath but I do not think this is related to his heart failure today.  Continue Entresto  97 to 23 mg twice daily, Coreg  12.5 mg twice daily.  Repeating echocardiogram and proBNP.  Shortness of breath and pedal edema-he does not appear to be volume overloaded but there is trace edema appreciated.  He has adventitious breath sounds and I think most likely the cause of his shortness of breath is related to his upper respiratory illness that he has been dealing with.  We will repeat an echocardiogram and check proBNP for completeness.  Hypertension-his blood pressure is well-controlled at 110/60 continue Norvasc  5 mg daily, Entresto  97-23 mg twice daily, Coreg  12.5 mg twice daily.  His blood pressure may be on the low normal side for him this may be because he is not feeling well, will have him keep a blood pressure log to make sure we do not need to make any adjustments to his medications, would likely stop his Norvasc  if his blood pressure is low.        Dispo: Echocardiogram, proBNP, blood pressure log for 2 weeks, keep follow-up with Dr. Sheena.   Signed, Delon JAYSON Hoover, NP

## 2024-02-21 ENCOUNTER — Other Ambulatory Visit: Payer: Self-pay | Admitting: *Deleted

## 2024-02-21 ENCOUNTER — Ambulatory Visit: Attending: Cardiology | Admitting: Cardiology

## 2024-02-21 ENCOUNTER — Other Ambulatory Visit: Payer: Self-pay

## 2024-02-21 ENCOUNTER — Encounter: Payer: Self-pay | Admitting: Cardiology

## 2024-02-21 ENCOUNTER — Ambulatory Visit: Payer: Medicare HMO | Admitting: Family Medicine

## 2024-02-21 ENCOUNTER — Encounter: Payer: Self-pay | Admitting: *Deleted

## 2024-02-21 VITALS — BP 110/60 | HR 58 | Ht 71.0 in | Wt 250.0 lb

## 2024-02-21 DIAGNOSIS — I10 Essential (primary) hypertension: Secondary | ICD-10-CM

## 2024-02-21 DIAGNOSIS — I5032 Chronic diastolic (congestive) heart failure: Secondary | ICD-10-CM | POA: Diagnosis not present

## 2024-02-21 DIAGNOSIS — I480 Paroxysmal atrial fibrillation: Secondary | ICD-10-CM | POA: Diagnosis not present

## 2024-02-21 DIAGNOSIS — D6859 Other primary thrombophilia: Secondary | ICD-10-CM | POA: Diagnosis not present

## 2024-02-21 DIAGNOSIS — R195 Other fecal abnormalities: Secondary | ICD-10-CM | POA: Insufficient documentation

## 2024-02-21 DIAGNOSIS — R0600 Dyspnea, unspecified: Secondary | ICD-10-CM

## 2024-02-21 DIAGNOSIS — Z85038 Personal history of other malignant neoplasm of large intestine: Secondary | ICD-10-CM | POA: Insufficient documentation

## 2024-02-21 DIAGNOSIS — K5730 Diverticulosis of large intestine without perforation or abscess without bleeding: Secondary | ICD-10-CM | POA: Insufficient documentation

## 2024-02-21 DIAGNOSIS — E669 Obesity, unspecified: Secondary | ICD-10-CM | POA: Insufficient documentation

## 2024-02-21 MED ORDER — ELIQUIS 5 MG PO TABS
5.0000 mg | ORAL_TABLET | Freq: Two times a day (BID) | ORAL | 1 refills | Status: AC
Start: 2024-02-21 — End: ?

## 2024-02-21 MED ORDER — AMLODIPINE BESYLATE 5 MG PO TABS: 5.0000 mg | ORAL_TABLET | Freq: Every day | ORAL | 3 refills | Status: DC

## 2024-02-21 NOTE — Telephone Encounter (Signed)
 Prescription refill request for Eliquis  received. Indication: AF Last office visit: 12/22/23  JINNY Heinrich PA-C Scr: 0.97 on 02/16/24  Epic Age: 78 Weight: 113.3kg  Based on above findings Eliquis  5mg  twice daily is the appropriate dose.  Refill approved.

## 2024-02-21 NOTE — Patient Instructions (Signed)
 Medication Instructions:  Your physician recommends that you continue on your current medications as directed. Please refer to the Current Medication list given to you today.  *If you need a refill on your cardiac medications before your next appointment, please call your pharmacy*  Lab Work: Your physician recommends that you return for lab work in: Today for ProBNP  If you have labs (blood work) drawn today and your tests are completely normal, you will receive your results only by: MyChart Message (if you have MyChart) OR A paper copy in the mail If you have any lab test that is abnormal or we need to change your treatment, we will call you to review the results.  Testing/Procedures: Your physician has requested that you have an echocardiogram. Echocardiography is a painless test that uses sound waves to create images of your heart. It provides your doctor with information about the size and shape of your heart and how well your heart's chambers and valves are working. This procedure takes approximately one hour. There are no restrictions for this procedure. Please do NOT wear cologne, perfume, aftershave, or lotions (deodorant is allowed). Please arrive 15 minutes prior to your appointment time.  Please note: We ask at that you not bring children with you during ultrasound (echo/ vascular) testing. Due to room size and safety concerns, children are not allowed in the ultrasound rooms during exams. Our front office staff cannot provide observation of children in our lobby area while testing is being conducted. An adult accompanying a patient to their appointment will only be allowed in the ultrasound room at the discretion of the ultrasound technician under special circumstances. We apologize for any inconvenience.   Follow-Up: At [facility], you and your health needs are our priority.  As part of our continuing mission to provide you with exceptional heart care, our providers are  all part of one team.  This team includes your primary Cardiologist (physician) and Advanced Practice Providers or APPs (Physician Assistants and Nurse Practitioners) who all work together to provide you with the care you need, when you need it.  Your next appointment:  F/U with Dr. Kardie Tobb    Provider:   Kardie Tobb, DO    We recommend signing up for the patient portal called MyChart.  Sign up information is provided on this After Visit Summary.  MyChart is used to connect with patients for Virtual Visits (Telemedicine).  Patients are able to view lab/test results, encounter notes, upcoming appointments, etc.  Non-urgent messages can be sent to your provider as well.   To learn more about what you can do with MyChart, go to [email or web address].   Other Instructions Check and record BP two times daily, once in the morning 1-2 hours after taking medication and once in the evening before dinner. You can drop off log at our office or send it through MyChart.

## 2024-02-22 ENCOUNTER — Ambulatory Visit: Payer: Self-pay | Admitting: Cardiology

## 2024-02-22 ENCOUNTER — Other Ambulatory Visit: Payer: Self-pay

## 2024-02-22 LAB — PRO B NATRIURETIC PEPTIDE: NT-Pro BNP: 167 pg/mL (ref 0–486)

## 2024-02-28 ENCOUNTER — Ambulatory Visit (HOSPITAL_COMMUNITY)
Admit: 2024-02-28 | Discharge: 2024-02-28 | Disposition: A | Discharge status: Home / Self Care | Attending: Internal Medicine | Admitting: Internal Medicine

## 2024-02-28 ENCOUNTER — Encounter (HOSPITAL_COMMUNITY): Payer: Self-pay | Admitting: Internal Medicine

## 2024-02-28 VITALS — BP 146/90 | HR 56 | Ht 71.0 in | Wt 250.0 lb

## 2024-02-28 DIAGNOSIS — D6869 Other thrombophilia: Secondary | ICD-10-CM

## 2024-02-28 DIAGNOSIS — I4819 Other persistent atrial fibrillation: Secondary | ICD-10-CM

## 2024-02-28 NOTE — Progress Notes (Signed)
 Primary Care Physician: Conley Dene BROCKS, DO Primary Cardiologist: Kardie Tobb, DO Electrophysiologist: Will Gladis Norton, MD     Referring Physician: Dr. Norton Austria Thomas Knight is a 78 y.o. male with a history of colon cancer metastatic disease to lung, HTN, CAD, OSA, chronic diastolic HF, HLD, and atrial fibrillation who presents for consultation in the [facility]. Patient is on Eliquis  5 mg BID for a CHADS2VASC score of 5.  On evaluation today, patient is currently in NSR. S/p Afib/flutter ablation on 11/24/23 by Dr. Norton. No episodes of Afib since ablation. No chest pain or SOB. Leg sites healed without issue. No missed doses of anticoagulant.  On follow up 02/28/24, patient is currently in NSR. He has noted overall low Afib burden in the past 90 days since ablation. He is taking amiodarone  200 mg daily. No bleeding issues on Eliquis .   Today, he denies symptoms of orthopnea, PND, lower extremity edema, dizziness, presyncope, syncope, snoring, daytime somnolence, bleeding, or neurologic sequela. The patient is tolerating medications without difficulties and is otherwise without complaint today.    he has a BMI of Body mass index is 34.87 kg/m.SABRA Filed Weights   02/28/24 1338  Weight: 113.4 kg     Current Outpatient Medications  Medication Sig Dispense Refill   acetaminophen  (TYLENOL ) 500 MG tablet Take 500-1,000 mg by mouth every 6 (six) hours as needed (pain.). (Patient taking differently: Take 500-1,000 mg by mouth as needed (pain.).)     amLODipine  (NORVASC ) 5 MG tablet Take 1 tablet (5 mg total) by mouth daily. 90 tablet 3   ascorbic acid  (VITAMIN C) 500 MG tablet Take 500 mg by mouth daily.     Calcium  Carbonate-Vit D-Min (CALCIUM  600+D3 PLUS MINERALS) 600-800 MG-UNIT TABS Take 1 tablet by mouth daily.     carvedilol  (COREG ) 12.5 MG tablet Take 1 tablet (12.5 mg total) by mouth 2 (two) times daily. 180 tablet 3   Cholecalciferol   (D3-1000) 25 MCG (1000 UT) capsule Take 1,000 Units by mouth daily.     diphenoxylate -atropine  (LOMOTIL ) 2.5-0.025 MG tablet Take 1 tablet by mouth 4 (four) times daily as needed for diarrhea or loose stools. (Patient taking differently: Take 1 tablet by mouth as needed for diarrhea or loose stools.) 30 tablet 3   ELIQUIS  5 MG TABS tablet Take 1 tablet (5 mg total) by mouth 2 (two) times daily. 180 tablet 1   lidocaine -prilocaine  (EMLA ) cream Apply 1 Application topically as needed (every 3 months).     Misc Natural Products (JOINT SUPPORT PO) Take 1 tablet by mouth daily. Instaflex Advanced Joint Support     nitroGLYCERIN  (NITROSTAT ) 0.4 MG SL tablet Place 1 tablet (0.4 mg total) under the tongue every 5 (five) minutes as needed. 25 tablet 5   sacubitril -valsartan  (ENTRESTO ) 97-103 MG Take 1 tablet by mouth 2 (two) times daily. 180 tablet 3   vitamin B-12 (CYANOCOBALAMIN ) 1000 MCG tablet Take 1,000 mcg by mouth every evening.     Zinc 50 MG CAPS Take 50 mg by mouth every evening.     No current facility-administered medications for this encounter.   Facility-Administered Medications Ordered in Other Encounters  Medication Dose Route Frequency Provider Last Rate Last Admin   sodium chloride  flush (NS) 0.9 % injection 10 mL  10 mL Intracatheter PRN [name], MD   10 mL at 11/19/22 0910    Atrial Fibrillation Management history:  Previous antiarrhythmic drugs: amiodarone  Previous cardioversions: none Previous  ablations: 11/24/23 Anticoagulation history: Eliquis    ROS- All systems are reviewed and negative except as per the HPI above.  Physical Exam: BP (!) 146/90   Pulse (!) 56   Ht 5' 11 (1.803 m)   Wt 113.4 kg   BMI 34.87 kg/m   GEN- The patient is well appearing, alert and oriented x 3 today.   Neck - no JVD or carotid bruit noted Lungs- Clear to ausculation bilaterally, normal work of breathing Heart- Regular rate and rhythm, no murmurs, rubs or gallops, PMI not laterally  displaced Extremities- no clubbing, cyanosis, or edema Skin - no rash or ecchymosis noted   EKG today demonstrates  Vent. rate 56 BPM PR interval 226 ms QRS duration 82 ms QT/QTcB 404/389 ms P-R-T axes 75 20 39 Sinus bradycardia with 1st degree A-V block Otherwise normal ECG When compared with ECG of 21-Feb-2024 14:53, No significant change was found  Echo 06/30/23 demonstrated   1. Left ventricular ejection fraction, by estimation, is 55 to 60%. The  left ventricle has normal function. The left ventricle has no regional  wall motion abnormalities. There is mild concentric left ventricular  hypertrophy. Left ventricular diastolic  parameters are indeterminate. The average left ventricular global  longitudinal strain is 16.1 %. The global longitudinal strain is abnormal.   2. Right ventricular systolic function is normal. The right ventricular  size is normal. There is normal pulmonary artery systolic pressure.   3. Left atrial size was mildly dilated.   4. The mitral valve is normal in structure. No evidence of mitral valve  regurgitation. No evidence of mitral stenosis.   5. The aortic valve is tricuspid. Aortic valve regurgitation is not  visualized. No aortic stenosis is present.   6. There is mild dilatation of the descending aorta, measuring 35 mm.   7. The inferior vena cava is normal in size with greater than 50%  respiratory variability, suggesting right atrial pressure of 3 mmHg.   ASSESSMENT & PLAN CHA2DS2-VASc Score = 5  The patient's score is based upon: CHF History: 1 HTN History: 1 Diabetes History: 0 Stroke History: 0 Vascular Disease History: 1 Age Score: 2 Gender Score: 0       ASSESSMENT AND PLAN: Persistent Atrial Fibrillation (ICD10:  I48.19) The patient's CHA2DS2-VASc score is 5, indicating a 7.2% annual risk of stroke.   S/p Afib/flutter ablation on 11/24/23 by Dr. Inocencio.  Patient is currently in NSR. Stop amiodarone  today.   Secondary  Hypercoagulable State (ICD10:  D68.69) The patient is at significant risk for stroke/thromboembolism based upon his CHA2DS2-VASc Score of 5.  Continue Apixaban  (Eliquis ).  Continue OAC.     Follow up 6 months with Dr. Inocencio.    Terra Pac, PA-C  Af[facility] [address] [place], [place] [address] [phone]

## 2024-02-28 NOTE — Patient Instructions (Signed)
 Stop amiodarone

## 2024-03-09 DIAGNOSIS — C4401 Basal cell carcinoma of skin of lip: Secondary | ICD-10-CM | POA: Diagnosis not present

## 2024-03-09 DIAGNOSIS — D485 Neoplasm of uncertain behavior of skin: Secondary | ICD-10-CM | POA: Diagnosis not present

## 2024-03-19 ENCOUNTER — Ambulatory Visit
Admission: RE | Admit: 2024-03-19 | Discharge: 2024-03-19 | Disposition: A | Discharge status: Home / Self Care | Source: Ambulatory Visit | Attending: Radiation Oncology | Admitting: Radiation Oncology

## 2024-03-19 DIAGNOSIS — C7801 Secondary malignant neoplasm of right lung: Secondary | ICD-10-CM | POA: Insufficient documentation

## 2024-03-21 ENCOUNTER — Ambulatory Visit: Attending: Cardiology

## 2024-03-21 ENCOUNTER — Telehealth: Payer: Self-pay | Admitting: Cardiology

## 2024-03-21 DIAGNOSIS — R0600 Dyspnea, unspecified: Secondary | ICD-10-CM

## 2024-03-21 LAB — ECHOCARDIOGRAM COMPLETE
Area-P 1/2: 3.62 cm2
S' Lateral: 3.8 cm

## 2024-03-21 NOTE — Telephone Encounter (Signed)
 LVM per DPR- per Dr. Vanetta Shawl note regarding normal Echo results. Encouraged to call with any questions. Routed to PCP.

## 2024-03-21 NOTE — Telephone Encounter (Signed)
 Received his BP log, BP looks to be well controlled, continue current medications. I was more worried that his BP was dropping too low causing him to feel fatigued, but it looks to be well controlled.

## 2024-03-22 ENCOUNTER — Other Ambulatory Visit: Payer: Self-pay

## 2024-03-22 DIAGNOSIS — I7789 Other specified disorders of arteries and arterioles: Secondary | ICD-10-CM

## 2024-03-23 NOTE — Progress Notes (Signed)
  Radiation Oncology         [phone] ________________________________  Name: Thomas Knight MRN: [id number]  Date of Service: 03/19/2024  DOB: February 28, 1946  Post Treatment Telephone Note  Diagnosis:   C78.01 Secondary malignant neoplasm of right lung Staging on 2021-01-09: Cancer of right colon (HCC) T=pT3, N=pN0, M=cM0   The patient was available for call today.  The patient did  note fatigue during radiation. The patient did  note skin changes in the field of radiation during therapy.    The patient is scheduled for CT scans every 3-6 months for ongoing surveillance. He/She was encouraged to call if  he  has not received a call to schedule CT imaging, or if  he  develops concerns or questions regarding radiation.  Spoke with patient 03/23/2024

## 2024-03-26 ENCOUNTER — Inpatient Hospital Stay: Attending: Physician Assistant

## 2024-03-26 DIAGNOSIS — C182 Malignant neoplasm of ascending colon: Secondary | ICD-10-CM | POA: Diagnosis not present

## 2024-03-26 LAB — CBC WITH DIFFERENTIAL/PLATELET
Abs Immature Granulocytes: 0.02 K/uL (ref 0.00–0.07)
Basophils Absolute: 0.1 K/uL (ref 0.0–0.1)
Basophils Relative: 1 %
Eosinophils Absolute: 0.2 K/uL (ref 0.0–0.5)
Eosinophils Relative: 4 %
HCT: 43 % (ref 39.0–52.0)
Hemoglobin: 14.7 g/dL (ref 13.0–17.0)
Immature Granulocytes: 0 %
Lymphocytes Relative: 18 %
Lymphs Abs: 0.8 K/uL (ref 0.7–4.0)
MCH: 32.5 pg (ref 26.0–34.0)
MCHC: 34.2 g/dL (ref 30.0–36.0)
MCV: 94.9 fL (ref 80.0–100.0)
Monocytes Absolute: 0.6 K/uL (ref 0.1–1.0)
Monocytes Relative: 14 %
Neutro Abs: 2.9 K/uL (ref 1.7–7.7)
Neutrophils Relative %: 63 %
Platelets: 144 K/uL — ABNORMAL LOW (ref 150–400)
RBC: 4.53 MIL/uL (ref 4.22–5.81)
RDW: 14.5 % (ref 11.5–15.5)
WBC: 4.6 K/uL (ref 4.0–10.5)
nRBC: 0 % (ref 0.0–0.2)

## 2024-03-26 LAB — COMPREHENSIVE METABOLIC PANEL WITH GFR
ALT: 12 U/L (ref 0–44)
AST: 14 U/L — ABNORMAL LOW (ref 15–41)
Albumin: 3.8 g/dL (ref 3.5–5.0)
Alkaline Phosphatase: 51 U/L (ref 38–126)
Anion gap: 4 — ABNORMAL LOW (ref 5–15)
BUN: 13 mg/dL (ref 8–23)
CO2: 30 mmol/L (ref 22–32)
Calcium: 8.6 mg/dL — ABNORMAL LOW (ref 8.9–10.3)
Chloride: 107 mmol/L (ref 98–111)
Creatinine, Ser: 1.04 mg/dL (ref 0.61–1.24)
GFR, Estimated: >60 mL/min (ref >60)
Glucose, Bld: 123 mg/dL — ABNORMAL HIGH (ref 70–99)
Potassium: 4 mmol/L (ref 3.5–5.1)
Sodium: 141 mmol/L (ref 135–145)
Total Bilirubin: 0.4 mg/dL (ref 0.0–1.2)
Total Protein: 6.5 g/dL (ref 6.5–8.1)

## 2024-03-26 LAB — CEA (ACCESS): CEA (CHCC): 3.89 ng/mL (ref 0.00–5.00)

## 2024-04-04 ENCOUNTER — Other Ambulatory Visit: Payer: Self-pay | Admitting: Hematology

## 2024-04-10 ENCOUNTER — Telehealth: Payer: Self-pay

## 2024-04-10 NOTE — Telephone Encounter (Signed)
 Received blood pressure log via mail. Dr. Sheena would like to increase Amlodipine  to 10 mg once daily of 5 mg twice daily. Called pt to ask his preference, no answer. Left message for pt to return the call.

## 2024-04-12 ENCOUNTER — Telehealth: Payer: Self-pay

## 2024-04-12 ENCOUNTER — Other Ambulatory Visit: Payer: Self-pay | Admitting: Hematology

## 2024-04-12 DIAGNOSIS — C189 Malignant neoplasm of colon, unspecified: Secondary | ICD-10-CM

## 2024-04-12 DIAGNOSIS — R197 Diarrhea, unspecified: Secondary | ICD-10-CM

## 2024-04-12 MED ORDER — AMLODIPINE BESYLATE 5 MG PO TABS
5.0000 mg | ORAL_TABLET | Freq: Two times a day (BID) | ORAL | 3 refills | Status: AC
Start: 2024-04-12 — End: 2024-07-11

## 2024-04-12 MED ORDER — DIPHENOXYLATE-ATROPINE 2.5-0.025 MG PO TABS
1.0000 | ORAL_TABLET | Freq: Four times a day (QID) | ORAL | 1 refills | Status: AC | PRN
Start: 2024-04-12 — End: ?

## 2024-04-12 NOTE — Addendum Note (Signed)
 Addended by: MELIDA ROLIN HERO on: 04/12/2024 10:23 AM   Modules accepted: Orders

## 2024-04-12 NOTE — Telephone Encounter (Signed)
 Pt returned call.  Pt would prefer to take Amlodipine  5 mg twice daily. Updated prescription sent to preferred pharmacy.

## 2024-04-12 NOTE — Telephone Encounter (Signed)
 Pt called requesting a refill on his Lomotil  for his persistent diarrhea.  Pt requested if the refill could be sent to [facility].  Notified Dr. Lanny and her Team of the pt's request.

## 2024-05-04 ENCOUNTER — Other Ambulatory Visit: Payer: Self-pay

## 2024-05-04 NOTE — Progress Notes (Signed)
 As per Dr. Lanny, [facility] kit and paperwork sent to lab to be drawn on 11/10.

## 2024-05-07 ENCOUNTER — Other Ambulatory Visit: Payer: Self-pay

## 2024-05-07 ENCOUNTER — Inpatient Hospital Stay: Attending: Physician Assistant

## 2024-05-07 ENCOUNTER — Ambulatory Visit (HOSPITAL_COMMUNITY)
Admission: RE | Admit: 2024-05-07 | Discharge: 2024-05-07 | Disposition: A | Discharge status: Home / Self Care | Source: Ambulatory Visit | Attending: Hematology | Admitting: Hematology

## 2024-05-07 DIAGNOSIS — N400 Enlarged prostate without lower urinary tract symptoms: Secondary | ICD-10-CM | POA: Diagnosis not present

## 2024-05-07 DIAGNOSIS — R600 Localized edema: Secondary | ICD-10-CM | POA: Insufficient documentation

## 2024-05-07 DIAGNOSIS — C7801 Secondary malignant neoplasm of right lung: Secondary | ICD-10-CM | POA: Insufficient documentation

## 2024-05-07 DIAGNOSIS — C182 Malignant neoplasm of ascending colon: Secondary | ICD-10-CM | POA: Insufficient documentation

## 2024-05-07 DIAGNOSIS — K7689 Other specified diseases of liver: Secondary | ICD-10-CM | POA: Diagnosis not present

## 2024-05-07 DIAGNOSIS — I700 Atherosclerosis of aorta: Secondary | ICD-10-CM | POA: Diagnosis not present

## 2024-05-07 DIAGNOSIS — E041 Nontoxic single thyroid nodule: Secondary | ICD-10-CM | POA: Diagnosis not present

## 2024-05-07 DIAGNOSIS — J701 Chronic and other pulmonary manifestations due to radiation: Secondary | ICD-10-CM | POA: Diagnosis not present

## 2024-05-07 LAB — CMP (CANCER CENTER ONLY)
ALT: 13 U/L (ref 0–44)
AST: 15 U/L (ref 15–41)
Albumin: 3.9 g/dL (ref 3.5–5.0)
Alkaline Phosphatase: 59 U/L (ref 38–126)
Anion gap: 5 (ref 5–15)
BUN: 14 mg/dL (ref 8–23)
CO2: 27 mmol/L (ref 22–32)
Calcium: 8.9 mg/dL (ref 8.9–10.3)
Chloride: 106 mmol/L (ref 98–111)
Creatinine: 1.01 mg/dL (ref 0.61–1.24)
GFR, Estimated: >60 mL/min (ref >60)
Glucose, Bld: 115 mg/dL — ABNORMAL HIGH (ref 70–99)
Potassium: 4.1 mmol/L (ref 3.5–5.1)
Sodium: 138 mmol/L (ref 135–145)
Total Bilirubin: 0.5 mg/dL (ref 0.0–1.2)
Total Protein: 7 g/dL (ref 6.5–8.1)

## 2024-05-07 LAB — CBC WITH DIFFERENTIAL (CANCER CENTER ONLY)
Abs Immature Granulocytes: 0.01 K/uL (ref 0.00–0.07)
Basophils Absolute: 0.1 K/uL (ref 0.0–0.1)
Basophils Relative: 1 %
Eosinophils Absolute: 0.2 K/uL (ref 0.0–0.5)
Eosinophils Relative: 3 %
HCT: 44.3 % (ref 39.0–52.0)
Hemoglobin: 15.1 g/dL (ref 13.0–17.0)
Immature Granulocytes: 0 %
Lymphocytes Relative: 12 %
Lymphs Abs: 0.8 K/uL (ref 0.7–4.0)
MCH: 32.5 pg (ref 26.0–34.0)
MCHC: 34.1 g/dL (ref 30.0–36.0)
MCV: 95.3 fL (ref 80.0–100.0)
Monocytes Absolute: 0.8 K/uL (ref 0.1–1.0)
Monocytes Relative: 12 %
Neutro Abs: 4.9 K/uL (ref 1.7–7.7)
Neutrophils Relative %: 72 %
Platelet Count: 172 K/uL (ref 150–400)
RBC: 4.65 MIL/uL (ref 4.22–5.81)
RDW: 14.1 % (ref 11.5–15.5)
WBC Count: 6.9 K/uL (ref 4.0–10.5)
nRBC: 0 % (ref 0.0–0.2)

## 2024-05-07 LAB — MISCELLANEOUS TEST

## 2024-05-07 LAB — CEA (ACCESS): CEA (CHCC): 4.28 ng/mL (ref 0.00–5.00)

## 2024-05-07 MED ORDER — HEPARIN SOD (PORK) LOCK FLUSH 100 UNIT/ML IV SOLN
500.0000 [IU] | Freq: Once | INTRAVENOUS | Status: AC
  Administered 2024-05-07: 500 [IU] via INTRAVENOUS

## 2024-05-07 MED ORDER — IOHEXOL 9 MG/ML PO SOLN
500.0000 mL | ORAL | Status: AC
  Administered 2024-05-07 (×2): 500 mL via ORAL

## 2024-05-07 MED ORDER — HEPARIN SOD (PORK) LOCK FLUSH 100 UNIT/ML IV SOLN
INTRAVENOUS | Status: AC
  Filled 2024-05-07: qty 5

## 2024-05-07 MED ORDER — SODIUM CHLORIDE (PF) 0.9 % IJ SOLN
INTRAMUSCULAR | Status: AC
  Filled 2024-05-07: qty 50

## 2024-05-07 MED ORDER — IOHEXOL 300 MG/ML  SOLN
100.0000 mL | Freq: Once | INTRAMUSCULAR | Status: AC | PRN
  Administered 2024-05-07: 100 mL via INTRAVENOUS

## 2024-05-13 LAB — SIGNATERA
SIGNATERA MTM READOUT: 0 MTM/ml
SIGNATERA TEST RESULT: NEGATIVE

## 2024-05-13 NOTE — Assessment & Plan Note (Signed)
-  stage II (pT3, N0), metastatic disease to lung and possible nodes in July 2023, MSS, KRAS/NRAS/BRAF wild type   -found on screening colonoscopy. S/p right hemicolectomy 01/09/21. -lung metastasis confirmed in RUL by bronchoscopy on 02/09/22. -FoundationOne showed MSS, low tumor burden, KRAS/NRAS/BRAF wild-type, no other targetable mutations. The benefit of EGFR inhibitor is much less in right-sided colon cancer, so I will not give it as first line but may consider in subsequent lines.  -he started FOLFOX on 03/03/22. He tolerated well overall with diarrhea for several days. Bevacizumab  added with C2 (9/20).  -CT scan 05/26/2022 showed improved lung mets, but showed new infiltrative lung change, possible related to his recent URI -repeated CT chest on 07/26/2022 showed stable known lung mets (overall excellent response, with minimum residual dsease) and resolved previously see inflammatory change in RLL -I have changed treatment to maintenance xeloda  and beva every 3 weeks, he started C1 on 08/18/2022.  -due to recurrent diarrhea, I stopped Xeloda  after 2 cycles -we have changed his treatment to 5-fu pump infusion and beva every 2 weeks on 4/24, he tolerated reasonably well, diarrhea has resolved now. -Restaging CT scan yesterday showed stable appearance of previously scattered small pulmonary nodules, measuring 4-37mm, no new lesions.  Overall has much improved compared to his initial CT scan from July 2023, and it is a hard to tell if this is residual disease versus scar tissue.  -We stopped his chemo after last scan and he is on cancer surveillance now. -Personally reviewed his follow-up CT scan from February 14, 2023, which showed slightly increase the size of the 2 nodule in the right lung, most consistent with disease progression.  No other new metastasis. -He completed SBRT on 04/11/2023, and second course in 01/2024 to LUL and RML lung nodules  -CT 05/07/2024 showed post-radiation change, no new lesions

## 2024-05-14 ENCOUNTER — Inpatient Hospital Stay (HOSPITAL_BASED_OUTPATIENT_CLINIC_OR_DEPARTMENT_OTHER): Admitting: Hematology

## 2024-05-14 VITALS — BP 137/69 | HR 62 | Temp 98.1°F | Resp 16 | Ht 71.0 in | Wt 259.9 lb

## 2024-05-14 DIAGNOSIS — C182 Malignant neoplasm of ascending colon: Secondary | ICD-10-CM | POA: Diagnosis not present

## 2024-05-14 DIAGNOSIS — R600 Localized edema: Secondary | ICD-10-CM | POA: Diagnosis not present

## 2024-05-14 DIAGNOSIS — C7801 Secondary malignant neoplasm of right lung: Secondary | ICD-10-CM | POA: Diagnosis not present

## 2024-05-14 DIAGNOSIS — J701 Chronic and other pulmonary manifestations due to radiation: Secondary | ICD-10-CM | POA: Diagnosis not present

## 2024-05-14 NOTE — Progress Notes (Signed)
 [name]   [phone] [phone]   Clinic Follow up Note   Patient Care Team: Conley Dene BROCKS, DO as PCP - General (Family Medicine) Sheena Pugh, DO as PCP - Cardiology (Cardiology) Inocencio Soyla Lunger, MD as PCP - Electrophysiology (Cardiology) [name], MD as Attending Physician (Hematology and Oncology)  Date of Service:  05/14/2024  CHIEF COMPLAINT: f/u of metastatic colon cancer to lungs  CURRENT THERAPY:  Cancer surveillance  Oncology History   Cancer of right colon [facility]) -stage II (pT3, N0), metastatic disease to lung and possible nodes in July 2023, MSS, KRAS/NRAS/BRAF wild type   -found on screening colonoscopy. S/p right hemicolectomy 01/09/21. -lung metastasis confirmed in RUL by bronchoscopy on 02/09/22. -FoundationOne showed MSS, low tumor burden, KRAS/NRAS/BRAF wild-type, no other targetable mutations. The benefit of EGFR inhibitor is much less in right-sided colon cancer, so I will not give it as first line but may consider in subsequent lines.  -he started FOLFOX on 03/03/22. He tolerated well overall with diarrhea for several days. Bevacizumab  added with C2 (9/20).  -CT scan 05/26/2022 showed improved lung mets, but showed new infiltrative lung change, possible related to his recent URI -repeated CT chest on 07/26/2022 showed stable known lung mets (overall excellent response, with minimum residual dsease) and resolved previously see inflammatory change in RLL -I have changed treatment to maintenance xeloda  and beva every 3 weeks, he started C1 on 08/18/2022.  -due to recurrent diarrhea, I stopped Xeloda  after 2 cycles -we have changed his treatment to 5-fu pump infusion and beva every 2 weeks on 4/24, he tolerated reasonably well, diarrhea has resolved now. -Restaging CT scan yesterday showed stable appearance of previously scattered small pulmonary nodules, measuring 4-59mm, no new lesions.  Overall has much improved compared  to his initial CT scan from July 2023, and it is a hard to tell if this is residual disease versus scar tissue.  -We stopped his chemo after last scan and he is on cancer surveillance now. -Personally reviewed his follow-up CT scan from February 14, 2023, which showed slightly increase the size of the 2 nodule in the right lung, most consistent with disease progression.  No other new metastasis. -He completed SBRT on 04/11/2023, and second course in 01/2024 to LUL and RML lung nodules  -CT 05/07/2024 showed post-radiation change, no new lesions   Assessment & Plan Metastatic colon cancer to lung, currently no evidence of active disease No evidence of active disease on recent CT scan. Previous treated area shows inflammation and scar tissue from radiation. No new nodules or growths observed. Signatera test for circulating tumor DNA was negative. PET scan not indicated at this time due to potential false positives from radiation-induced inflammation. - Continue surveillance with CT scan of the chest every three months for at least one year, then extend to every four to six months. - Continue Signatera testing as needed. - Maintain port for at least another year.  Radiation-induced pulmonary fibrosis and inflammation Pulmonary fibrosis and inflammation secondary to radiation therapy. Symptoms include cough and shortness of breath, particularly when lying flat. Symptoms are expected to improve over time but may persist due to existing scar tissue. - Encouraged use of spirometer for breathing exercises. - Advised follow-up with cardiologist to assess for potential cardiac contribution to symptoms.  Stable indeterminate pulmonary nodules Indeterminate pulmonary nodules remain stable. Right middle lobe nodule is 3 mm with no change in size. Left lung nodules are below 5 mm and difficult to  assess for malignancy. No active disease suspected based on current imaging and blood tests. - Continue monitoring  with CT scans every three months. - Will discuss potential future ablation procedure if nodules grow or new nodules appear.  Bilateral ankle edema and orthopnea Intermittent bilateral ankle edema, worse throughout the day. Compression stockings have been helpful. No associated pain, but difficulty with shoe fitting. Possible cardiac contribution to edema. - Recommended obtaining new compression stockings from a medical device company for proper fit. - Advised follow-up with cardiologist to evaluate for potential cardiac causes of edema.  Plan - I personally reviewed his surveillance CT scan images and compared to previous CT scans.  Accepted the radiation changes in his lungs, and stable tiny lung nodules, no high concern for new metastasis. - Continue cancer surveillance, plan to repeat a CT chest without contrast in 3 months - Continue port flush every 6 weeks - I encouraged him to follow-up with his cardiologist for leg edema and orthopnea -f/u in 3 months    SUMMARY OF ONCOLOGIC HISTORY: Oncology History Overview Note   Cancer Staging  Cancer of right colon [facility]) Staging form: Colon and Rectum, AJCC 8th Edition - Pathologic stage from 01/09/2021: Stage IIA (pT3, pN0, cM0) - Signed by [name], MD on 02/12/2022 Total positive nodes: 0 Histologic grading system: 4 grade system Histologic grade (G): G2 Residual tumor (R): R0 - None     Cancer of right colon (HCC)  10/2020 Procedure   Colonoscopy-Dr. Rollin     11/06/2020 Imaging   CT CHEST ABDOMEN PELVIS W CONTRAST   IMPRESSION: 1. 4.6 cm partially circumferential apple-core type neoplastic process involving the mid transverse colon. No findings for locoregional adenopathy or distant metastatic disease. 2. Enlarged prostate gland with median lobe hypertrophy impressing on the base of the bladder. 3. 16 mm left thyroid  nodule. Recommend follow-up thyroid  ultrasound examination.   01/09/2021 Initial Diagnosis   Colon cancer  (HCC)   01/09/2021 Pathology Results   B. COLON, RIGHT, RESECTION:  -  Adenocarcinoma, moderately differentiated, 3.0 cm  -  No carcinoma identified in seventeen lymph nodes (0/17)  -  Margins uninvolved by carcinoma  -  Tubular adenoma (x2)  -  Benign appendix  -  See oncology table and comment below   Margin Status for Invasive Carcinoma: All margins negative for  invasive carcinoma       Distance from Invasive Carcinoma to Radial (Circumferential)       Margin Status for Non-Invasive Tumor: All margins negative for  high-grade dysplasia / intramucosal carcinoma and low-grade dysplasia  Regional Lymph Nodes:       Number of Lymph Nodes with Tumor: 0       Number of Lymph Nodes Examined: 17  Tumor Deposits: 0  Distant Metastasis:       Distant Site(s) Involved: Not applicable  Pathologic Stage Classification (pTNM, AJCC 8th Edition): pT3, pN0   MMR Stable    01/09/2021 Cancer Staging   Staging form: Colon and Rectum, AJCC 8th Edition - Pathologic stage from 01/09/2021: Stage IIA (pT3, pN0, cM0) - Signed by [name], MD on 02/12/2022 Total positive nodes: 0 Histologic grading system: 4 grade system Histologic grade (G): G2 Residual tumor (R): R0 - None   05/2021 Tumor Marker   Patient's tumor was tested for the following markers: CEA. Results of the tumor marker test revealed 2.1.   12/11/2021 Procedure   Colonoscopy-Dr. Rollin  There was evidence of a prior functional end-to-end ileo-colonic anastomosis in  the transverse colon. This was patent and was characterized by healthy appearing mucosa. The anastomosis was traversed. Findings: Scattered small and large-mouthed diverticula were found in the sigmoid colon. - Patent functional end-to-end ileo-colonic anastomosis, characterized by healthy appearing mucosa. - Diverticulosis in the sigmoid colon. - No specimens collected.   01/25/2022 Imaging   CT CHEST ABDOMEN PELVIS W CONTRAST   IMPRESSION: 1. New right-sided  pulmonary nodules measure up to 11 mm, nonspecific but concerning for metastatic disease. Consider further evaluation with nuclear medicine PET/CT. 2. New nodularity/lymph nodes in the central mesentery, concerning for nodal disease involvement. 3. Slightly increased size of prominent retroperitoneal lymph nodes, nonspecific but also somewhat concerning for nodal disease involvement. 4. Surgical change of partial colectomy without evidence of local recurrence. 5. Stable prominent mediastinal lymph nodes, nonspecific but stability is reassuring. Attention on follow-up imaging suggested. 6. Stable prominent right external iliac lymph node, nonspecific but stability is reassuring. Attention on follow-up imaging suggested. 7. Stable hypodense 8 mm hepatic lesion technically too small to accurately characterize but stability is reassuring. Attention on follow-up imaging suggested. 8. 1.9 cm incidental left thyroid  nodule. Recommend thyroid  US . Reference: J Am Coll Radiol. 2015 Feb;12(2): 143-50 9.  Aortic Atherosclerosis (ICD10-I70.0).   02/09/2022 Procedure   Bronchoscopy under the care of Dr. Brenna    02/09/2022 Pathology Results   FINAL MICROSCOPIC DIAGNOSIS:   A. LUNG, RUL, FINE NEEDLE ASPIRATION  BIOPSY FORCEPS:  Adenocarcinoma with morphologic features consistent with metastasis from a colorectal primary.  Please see comment.   Comment: The following immunostains are performed with appropriate controls :  TTF-1: Negative .  Napsin A: Negative .  CK7: Negative.  CK20: Positive.  CDX2: Positive .   The above immuno histochemical profile is supportive of the rendered diagnosis.    08/18/2022 - 08/18/2022 Chemotherapy   Patient is on Treatment Plan : COLORECTAL Bevacizumab  q14d     Metastasis to lung (HCC)  02/09/2022 Pathology Results   FINAL MICROSCOPIC DIAGNOSIS:   A. LUNG, RUL, FINE NEEDLE ASPIRATION  BIOPSY FORCEPS:  Adenocarcinoma with morphologic features consistent  with metastasis from a colorectal primary.  Please see comment.   Comment: The following immunostains are performed with appropriate controls :  TTF-1: Negative .  Napsin A: Negative .  CK7: Negative.  CK20: Positive.  CDX2: Positive .   The above immuno histochemical profile is supportive of the rendered diagnosis.    02/12/2022 Initial Diagnosis   Colon cancer metastasized to lung (HCC)   03/03/2022 - 07/31/2022 Chemotherapy   Patient is on Treatment Plan : COLORECTAL FOLFOX + Bevacizumab  q14d     07/26/2022 Imaging    IMPRESSION: 1. Near-complete interval resolution of nodular and masslike areas of irregular consolidation, likely resolving infection or organizing pneumonia/drug reaction. 2. Additional previously seen pulmonary nodules are stable, consistent with treated pulmonary metastatic disease. 3. Mild peribronchovascular reticular opacities which are most pronounced in the mid and lower lungs, similar to prior exam, progressed when compared with prior dated January 15, 2022. Findings can be seen in the setting of interstitial lung disease, pattern is most consistent with fibrotic NSIP. 4. Aortic Atherosclerosis (ICD10-I70.0).   07/26/2022 Imaging    IMPRESSION: 1. Near-complete interval resolution of nodular and masslike areas of irregular consolidation, likely resolving infection or organizing pneumonia/drug reaction. 2. Additional previously seen pulmonary nodules are stable, consistent with treated pulmonary metastatic disease. 3. Mild peribronchovascular reticular opacities which are most pronounced in the mid and lower lungs, similar to prior exam, progressed when  compared with prior dated January 15, 2022. Findings can be seen in the setting of interstitial lung disease, pattern is most consistent with fibrotic NSIP. 4. Aortic Atherosclerosis (ICD10-I70.0).   09/08/2022 - 09/08/2022 Chemotherapy   Patient is on Treatment Plan : COLORECTAL Bevacizumab  q21d      10/20/2022 -  Chemotherapy   Patient is on Treatment Plan : COLORECTAL 5FU + Leucovorin  (Modified DeGramont) + Bevacizumab  q14d        Discussed the use of AI scribe software for clinical note transcription with the patient, who gave verbal consent to proceed.  History of Present Illness Thomas Knight is a 78 year old male with metastatic colon cancer to the lung who presents for follow-up.  He experiences increased fatigue, particularly with extensive walking. He has gained five to six pounds since starting treatment, but his appetite has improved, allowing him to enjoy food again.  Bilateral ankle swelling occurs, worsening throughout the day and improving by morning. Compression socks provide some relief, though they are difficult to put on. The swelling affects the tops of his feet and complicates wearing dress shoes.  He experiences wheezing when lying down at night and a dry cough, especially when using a spirometer for daily breathing exercises. There is no fever, and he rarely produces phlegm.  A blood test for circulating tumor DNA is negative. A stable 3mm nodule in the middle lobe of the right lung remains unchanged in size.     All other systems were reviewed with the patient and are negative.  MEDICAL HISTORY:  Past Medical History:  Diagnosis Date   Bilateral leg edema 08/24/2019   Cancer (HCC)    skin ca, colon   Chronic cough    Chronic pain of left knee 12/01/2015   COVID-19    06/2019   Dysrhythmia    afib  was cardioverted   Essential hypertension 08/24/2019   Mixed hyperlipidemia 08/24/2019   Obesity (BMI 30-39.9) 08/24/2019   Persistent atrial fibrillation (HCC) 08/24/2019    SURGICAL HISTORY: Past Surgical History:  Procedure Laterality Date   ACHILLES TENDON REPAIR Left 2012   Ruptured   ATRIAL FIBRILLATION ABLATION N/A 11/24/2023   Procedure: ATRIAL FIBRILLATION ABLATION;  Surgeon: Inocencio Soyla Lunger, MD;  Location: [facility];   Service: Cardiovascular;  Laterality: N/A;   BASAL CELL CARCINOMA EXCISION  01/2023   right nose, back   BRONCHIAL BIOPSY  02/09/2022   Procedure: BRONCHIAL BIOPSIES;  Surgeon: Brenna Adine CROME, DO;  Location: [facility]OS[facility];  Service: Pulmonary;;   BRONCHIAL NEEDLE ASPIRATION BIOPSY  02/09/2022   Procedure: BRONCHIAL NEEDLE ASPIRATION BIOPSIES;  Surgeon: Brenna Adine CROME, DO;  Location: [facility]OS[facility];  Service: Pulmonary;;   COLONOSCOPY WITH PROPOFOL  N/A 12/11/2021   Procedure: COLONOSCOPY WITH PROPOFOL ;  Surgeon: Rollin Dover, MD;  Location: [facility]OS[facility];  Service: Gastroenterology;  Laterality: N/A;   HERNIA REPAIR     umbilical hernia   IR IMAGING GUIDED PORT INSERTION  02/18/2022   SKIN SURGERY Left    Basal cell carcinoma on left forearm   XI ROBOTIC ASSISTED LOWER ANTERIOR RESECTION N/A 01/09/2021   Procedure: XI ROBOTIC ASSISTED RIGHT HEMI COLECTOMY, LYSIS OF ADHESIONS, SMALL BOWEL RESECTION, INTRAOPERATIVE ASSESMENT OF PERFUSION, PARTIAL OMENTECTOMY;  Surgeon: Teresa Lonni CHRISTELLA, MD;  Location: [facility] ORS;  Service: General;  Laterality: N/A;    I have reviewed the social history and family history with the patient and they are unchanged from previous note.  ALLERGIES:  is allergic to furosemide  and  tramadol .  MEDICATIONS:  Current Outpatient Medications  Medication Sig Dispense Refill   acetaminophen  (TYLENOL ) 500 MG tablet Take 500-1,000 mg by mouth every 6 (six) hours as needed (pain.). (Patient taking differently: Take 500-1,000 mg by mouth as needed (pain.).)     amLODipine  (NORVASC ) 5 MG tablet Take 1 tablet (5 mg total) by mouth 2 (two) times daily. 180 tablet 3   ascorbic acid  (VITAMIN C) 500 MG tablet Take 500 mg by mouth daily.     Calcium  Carbonate-Vit D-Min (CALCIUM  600+D3 PLUS MINERALS) 600-800 MG-UNIT TABS Take 1 tablet by mouth daily.     carvedilol  (COREG ) 12.5 MG tablet Take 1 tablet (12.5 mg total) by mouth 2 (two) times daily. 180 tablet 3   Cholecalciferol   (D3-1000) 25 MCG (1000 UT) capsule Take 1,000 Units by mouth daily.     diphenoxylate -atropine  (LOMOTIL ) 2.5-0.025 MG tablet Take 1 tablet by mouth 4 (four) times daily as needed for diarrhea or loose stools. 30 tablet 1   ELIQUIS  5 MG TABS tablet Take 1 tablet (5 mg total) by mouth 2 (two) times daily. 180 tablet 1   lidocaine -prilocaine  (EMLA ) cream Apply 1 Application topically as needed (every 3 months).     Misc Natural Products (JOINT SUPPORT PO) Take 1 tablet by mouth daily. Instaflex Advanced Joint Support     nitroGLYCERIN  (NITROSTAT ) 0.4 MG SL tablet Place 1 tablet (0.4 mg total) under the tongue every 5 (five) minutes as needed. 25 tablet 5   sacubitril -valsartan  (ENTRESTO ) 97-103 MG Take 1 tablet by mouth 2 (two) times daily. 180 tablet 3   vitamin B-12 (CYANOCOBALAMIN ) 1000 MCG tablet Take 1,000 mcg by mouth every evening.     Zinc 50 MG CAPS Take 50 mg by mouth every evening.     No current facility-administered medications for this visit.   Facility-Administered Medications Ordered in Other Visits  Medication Dose Route Frequency Provider Last Rate Last Admin   sodium chloride  flush (NS) 0.9 % injection 10 mL  10 mL Intracatheter PRN [name], MD   10 mL at 11/19/22 0910    PHYSICAL EXAMINATION: ECOG PERFORMANCE STATUS: 1 - Symptomatic but completely ambulatory  Vitals:   05/14/24 1000  BP: 137/69  Pulse: 62  Resp: 16  Temp: 98.1 F (36.7 C)  SpO2: 93%   Wt Readings from Last 3 Encounters:  05/14/24 259 lb 14.4 oz (117.9 kg)  02/28/24 250 lb (113.4 kg)  02/21/24 250 lb (113.4 kg)     GENERAL:alert, no distress and comfortable SKIN: skin color, texture, turgor are normal, no rashes or significant lesions EYES: normal, Conjunctiva are pink and non-injected, sclera clear NECK: supple, thyroid  normal size, non-tender, without nodularity LYMPH:  no palpable lymphadenopathy in the cervical, axillary  LUNGS: clear to auscultation and percussion with normal breathing  effort HEART: regular rate & rhythm and no murmurs and no lower extremity edema ABDOMEN:abdomen soft, non-tender and normal bowel sounds Musculoskeletal:no cyanosis of digits and no clubbing  NEURO: alert & oriented x 3 with fluent speech, no focal motor/sensory deficits  Physical Exam CARDIOVASCULAR: Heart sounds normal.  LABORATORY DATA:  I have reviewed the data as listed    Latest Ref Rng & Units 05/07/2024    9:49 AM 03/26/2024   10:21 AM 02/16/2024   10:30 AM  CBC  WBC 4.0 - 10.5 K/uL 6.9  4.6  6.4   Hemoglobin 13.0 - 17.0 g/dL 84.8  85.2  85.4   Hematocrit 39.0 - 52.0 % 44.3  43.0  42.6   Platelets 150 - 400 K/uL 172  144  141         Latest Ref Rng & Units 05/07/2024    9:49 AM 03/26/2024   10:21 AM 02/16/2024   10:30 AM  CMP  Glucose 70 - 99 mg/dL 884  876  897   BUN 8 - 23 mg/dL 14  13  16    Creatinine 0.61 - 1.24 mg/dL 8.98  8.95  9.02   Sodium 135 - 145 mmol/L 138  141  139   Potassium 3.5 - 5.1 mmol/L 4.1  4.0  4.2   Chloride 98 - 111 mmol/L 106  107  106   CO2 22 - 32 mmol/L 27  30  30    Calcium  8.9 - 10.3 mg/dL 8.9  8.6  8.4   Total Protein 6.5 - 8.1 g/dL 7.0  6.5  6.3   Total Bilirubin 0.0 - 1.2 mg/dL 0.5  0.4  0.6   Alkaline Phos 38 - 126 U/L 59  51  47   AST 15 - 41 U/L 15  14  12    ALT 0 - 44 U/L 13  12  10        RADIOGRAPHIC STUDIES: I have personally reviewed the radiological images as listed and agreed with the findings in the report. No results found.    Orders Placed This Encounter  Procedures   CT Chest Wo Contrast    Standing Status:   Future    Expected Date:   08/07/2024    Expiration Date:   05/14/2025    Preferred imaging location?:   [facility]   Patient and her family had multiple questions.  All questions were answered. The patient knows to call the clinic with any problems, questions or concerns. No barriers to learning was detected. The total time spent in the appointment was 40 minutes, including review of chart and  various tests results, discussions about plan of care and coordination of care plan     Onita Mattock, MD 05/14/2024

## 2024-05-15 ENCOUNTER — Other Ambulatory Visit: Payer: Self-pay

## 2024-05-16 ENCOUNTER — Encounter: Payer: Self-pay | Admitting: Cardiology

## 2024-05-16 ENCOUNTER — Ambulatory Visit: Attending: Cardiology | Admitting: Cardiology

## 2024-05-16 VITALS — BP 128/70 | HR 60 | Ht 71.0 in | Wt 256.8 lb

## 2024-05-16 DIAGNOSIS — I2510 Atherosclerotic heart disease of native coronary artery without angina pectoris: Secondary | ICD-10-CM

## 2024-05-16 DIAGNOSIS — Z79899 Other long term (current) drug therapy: Secondary | ICD-10-CM | POA: Diagnosis not present

## 2024-05-16 DIAGNOSIS — G4733 Obstructive sleep apnea (adult) (pediatric): Secondary | ICD-10-CM

## 2024-05-16 DIAGNOSIS — R0609 Other forms of dyspnea: Secondary | ICD-10-CM | POA: Diagnosis not present

## 2024-05-16 DIAGNOSIS — I5032 Chronic diastolic (congestive) heart failure: Secondary | ICD-10-CM

## 2024-05-16 MED ORDER — TORSEMIDE 20 MG PO TABS: 20.0000 mg | ORAL_TABLET | ORAL | 3 refills | Status: DC

## 2024-05-16 MED ORDER — POTASSIUM CHLORIDE CRYS ER 20 MEQ PO TBCR: 20.0000 meq | EXTENDED_RELEASE_TABLET | ORAL | 3 refills | Status: DC

## 2024-05-16 NOTE — Progress Notes (Signed)
 Cardiology Office Note:    Date:  05/19/2024   ID:  JAYANTH SZCZESNIAK, DOB [birth year], MRN [id number]  PCP:  Conley Dene BROCKS, DO  Cardiologist:  Alta Shober, DO  Electrophysiologist:  Soyla Gladis Norton, MD   Referring MD: Conley Dene BROCKS, DO    I am really tired  History of Present Illness:    Thomas Knight is a 78 y.o. male with a hx of minimal coronary artery disease seen on coronary CTA, paroxysmal atrial fibrillation on Eliquis  twice daily and metoprolol  25 mg twice a day, he is status post cardioversion and ablation in May 2025, hypertension, hyperlipidemia, morbid obesity, colon cancer status post chemotherapy and radiation currently in remission.  He tells me he has been experiencing some leg swelling and some fatigue.  But most bothersome if this shortness of breath, coughing.  He did not denies any chest pain or chest discomfort.   Past Medical History:  Diagnosis Date   Bilateral leg edema 08/24/2019   Cancer (HCC)    skin ca, colon   Chronic cough    Chronic pain of left knee 12/01/2015   COVID-19    06/2019   Dysrhythmia    afib  was cardioverted   Essential hypertension 08/24/2019   Mixed hyperlipidemia 08/24/2019   Obesity (BMI 30-39.9) 08/24/2019   Persistent atrial fibrillation (HCC) 08/24/2019    Past Surgical History:  Procedure Laterality Date   ACHILLES TENDON REPAIR Left 2012   Ruptured   ATRIAL FIBRILLATION ABLATION N/A 11/24/2023   Procedure: ATRIAL FIBRILLATION ABLATION;  Surgeon: Norton Soyla Gladis, MD;  Location: [facility];  Service: Cardiovascular;  Laterality: N/A;   BASAL CELL CARCINOMA EXCISION  01/2023   right nose, back   BRONCHIAL BIOPSY  02/09/2022   Procedure: BRONCHIAL BIOPSIES;  Surgeon: Brenna Adine CROME, DO;  Location: [facility];  Service: Pulmonary;;   BRONCHIAL NEEDLE ASPIRATION BIOPSY  02/09/2022   Procedure: BRONCHIAL NEEDLE ASPIRATION BIOPSIES;  Surgeon: Brenna Adine CROME, DO;  Location: [facility];   Service: Pulmonary;;   COLONOSCOPY WITH PROPOFOL  N/A 12/11/2021   Procedure: COLONOSCOPY WITH PROPOFOL ;  Surgeon: Rollin Dover, MD;  Location: [facility];  Service: Gastroenterology;  Laterality: N/A;   HERNIA REPAIR     umbilical hernia   IR IMAGING GUIDED PORT INSERTION  02/18/2022   SKIN SURGERY Left    Basal cell carcinoma on left forearm   XI ROBOTIC ASSISTED LOWER ANTERIOR RESECTION N/A 01/09/2021   Procedure: XI ROBOTIC ASSISTED RIGHT HEMI COLECTOMY, LYSIS OF ADHESIONS, SMALL BOWEL RESECTION, INTRAOPERATIVE ASSESMENT OF PERFUSION, PARTIAL OMENTECTOMY;  Surgeon: Teresa Lonni CHRISTELLA, MD;  Location: [facility];  Service: General;  Laterality: N/A;    Current Medications: Current Meds  Medication Sig   acetaminophen  (TYLENOL ) 500 MG tablet Take 500-1,000 mg by mouth every 6 (six) hours as needed (pain.). (Patient taking differently: Take 500-1,000 mg by mouth as needed (pain.).)   amLODipine  (NORVASC ) 5 MG tablet Take 1 tablet (5 mg total) by mouth 2 (two) times daily.   ascorbic acid  (VITAMIN C) 500 MG tablet Take 500 mg by mouth daily.   Calcium  Carbonate-Vit D-Min (CALCIUM  600+D3 PLUS MINERALS) 600-800 MG-UNIT TABS Take 1 tablet by mouth daily.   carvedilol  (COREG ) 12.5 MG tablet Take 1 tablet (12.5 mg total) by mouth 2 (two) times daily.   Cholecalciferol  (D3-1000) 25 MCG (1000 UT) capsule Take 1,000 Units by mouth daily.   diphenoxylate -atropine  (LOMOTIL ) 2.5-0.025 MG tablet Take 1 tablet by mouth 4 (four) times daily  as needed for diarrhea or loose stools.   ELIQUIS  5 MG TABS tablet Take 1 tablet (5 mg total) by mouth 2 (two) times daily.   lidocaine -prilocaine  (EMLA ) cream Apply 1 Application topically as needed (every 3 months).   Misc Natural Products (JOINT SUPPORT PO) Take 1 tablet by mouth daily. Instaflex Advanced Joint Support   nitroGLYCERIN  (NITROSTAT ) 0.4 MG SL tablet Place 1 tablet (0.4 mg total) under the tongue every 5 (five) minutes as needed.   potassium chloride  SA  (KLOR-CON  M20) 20 MEQ tablet Take 1 tablet (20 mEq total) by mouth once a week.   sacubitril -valsartan  (ENTRESTO ) 97-103 MG Take 1 tablet by mouth 2 (two) times daily.   torsemide  (DEMADEX ) 20 MG tablet Take 1 tablet (20 mg total) by mouth once a week.   vitamin B-12 (CYANOCOBALAMIN ) 1000 MCG tablet Take 1,000 mcg by mouth every evening.   Zinc 50 MG CAPS Take 50 mg by mouth every evening.     Allergies:   Furosemide  and Tramadol    Social History   Socioeconomic History   Marital status: Married    Spouse name: Not on file   Number of children: Not on file   Years of education: Not on file   Highest education level: Not on file  Occupational History   Not on file  Tobacco Use   Smoking status: Never   Smokeless tobacco: Never   Tobacco comments:    Never smoked 02/28/24  Vaping Use   Vaping status: Never Used  Substance and Sexual Activity   Alcohol use: Never   Drug use: Never   Sexual activity: Not Currently  Other Topics Concern   Not on file  Social History Narrative   Not on file   Social Drivers of Health   Financial Resource Strain: Not on file  Food Insecurity: No Food Insecurity (01/25/2024)   Hunger Vital Sign    Worried About Running Out of Food in the Last Year: Never true    Ran Out of Food in the Last Year: Never true  Transportation Needs: No Transportation Needs (01/25/2024)   PRAPARE - Administrator, Civil Service (Medical): No    Lack of Transportation (Non-Medical): No  Physical Activity: Not on file  Stress: Not on file  Social Connections: Not on file     Family History: The patient's family history includes Lung cancer in his father.  ROS:   Review of Systems  Constitution: Reports fatigue.  Negative for decreased appetite, fever and weight gain.  HENT: Negative for congestion, ear discharge, hoarse voice and sore throat.   Eyes: Negative for discharge, redness, vision loss in right eye and visual halos.  Cardiovascular:  Negative for chest pain, dyspnea on exertion, leg swelling, orthopnea and palpitations.  Respiratory: Negative for cough, hemoptysis, shortness of breath and snoring.   Endocrine: Negative for heat intolerance and polyphagia.  Hematologic/Lymphatic: Negative for bleeding problem. Does not bruise/bleed easily.  Skin: Negative for flushing, nail changes, rash and suspicious lesions.  Musculoskeletal: Negative for arthritis, joint pain, muscle cramps, myalgias, neck pain and stiffness.  Gastrointestinal: Negative for abdominal pain, bowel incontinence, diarrhea and excessive appetite.  Genitourinary: Negative for decreased libido, genital sores and incomplete emptying.  Neurological: Negative for brief paralysis, focal weakness, headaches and loss of balance.  Psychiatric/Behavioral: Negative for altered mental status, depression and suicidal ideas.  Allergic/Immunologic: Negative for HIV exposure and persistent infections.    EKGs/Labs/Other Studies Reviewed:    The following studies were reviewed  today:   EKG:  The ekg ordered today demonstrates atrial fibrillation with controlled ventricular rate  Recent Labs: 07/06/2023: Magnesium 2.1; TSH 5.330 02/21/2024: NT-Pro BNP 167 05/07/2024: ALT 13; BUN 14; Creatinine 1.01; Hemoglobin 15.1; Platelet Count 172; Potassium 4.1; Sodium 138  Recent Lipid Panel No results found for: CHOL, TRIG, HDL, CHOLHDL, VLDL, LDLCALC, LDLDIRECT  Physical Exam:    VS:  BP 128/70 (BP Location: Left Arm, Patient Position: Sitting, Cuff Size: Large)   Pulse 60   Ht 5' 11 (1.803 m)   Wt 256 lb 12.8 oz (116.5 kg)   SpO2 92%   BMI 35.82 kg/m     Wt Readings from Last 3 Encounters:  05/16/24 256 lb 12.8 oz (116.5 kg)  05/14/24 259 lb 14.4 oz (117.9 kg)  02/28/24 250 lb (113.4 kg)     GEN: Well nourished, well developed in no acute distress HEENT: Normal NECK: No JVD; No carotid bruits LYMPHATICS: No lymphadenopathy CARDIAC: S1S2 noted,RRR,  no murmurs, rubs, gallops RESPIRATORY:  Clear to auscultation without rales, wheezing or rhonchi  ABDOMEN: Soft, non-tender, non-distended, +bowel sounds, no guarding. EXTREMITIES: +1 bilateral lower edema, No cyanosis, no clubbing MUSCULOSKELETAL:  No deformity  SKIN: Warm and dry NEUROLOGIC:  Alert and oriented x 3, non-focal PSYCHIATRIC:  Normal affect, good insight  ASSESSMENT:    1. Medication management   2. DOE (dyspnea on exertion)   3. Mild CAD   4. Chronic heart failure with preserved ejection fraction (HCC)   5. Morbid obesity (HCC)   6. OSA (obstructive sleep apnea)    PLAN:    Atrial Fibrillation -he is status post ablation he is doing well.  He is currently only on carvedilol  12.5 mg twice daily.  He has been transition off amiodarone .  Continued Eliquis  for stroke prevention.  Recent echocardiogram showed normal ejection fraction   Hypertension -blood pressure is at target  Dilated cardiomyopathy/chronic diastolic heart failure-with recovered ejection fraction.  Continue current medication regimen.  Clinically some evidence of lower extremity edema will use torsemide  20 mg once a week or potassium supplement.  Will have blood work in 2 weeks.  Previous allergic reaction to Lasix  so we will cautiously watch the patient on this.  CAD-no anginal symptoms  OSA continue current management    Follow-up in 6 months to reassess rhythm status and blood pressure control.  The patient is in agreement with the above plan. The patient left the office in stable condition.  The patient will follow up in   Medication Adjustments/Labs and Tests Ordered: Current medicines are reviewed at length with the patient today.  Concerns regarding medicines are outlined above.  Orders Placed This Encounter  Procedures   Comp Met (CMET)   Magnesium   Pulmonary function test   Meds ordered this encounter  Medications   potassium chloride  SA (KLOR-CON  M20) 20 MEQ tablet    Sig: Take 1  tablet (20 mEq total) by mouth once a week.    Dispense:  18 tablet    Refill:  3   torsemide  (DEMADEX ) 20 MG tablet    Sig: Take 1 tablet (20 mg total) by mouth once a week.    Dispense:  18 tablet    Refill:  3    Patient Instructions  Medication Instructions:  Your physician has recommended you make the following change in your medication:  START: Potassium 20 mEq once weekly START: Torsemide  20 mg once weekly *If you need a refill on your cardiac medications before your  next appointment, please call your pharmacy*  Lab Work: CMET, Mag in 2 weeks.  If you have labs (blood work) drawn today and your tests are completely normal, you will receive your results only by: MyChart Message (if you have MyChart) OR A paper copy in the mail If you have any lab test that is abnormal or we need to change your treatment, we will call you to review the results.  Testing/Procedures: Pulmonary Function Tests Pulmonary function tests (PFTs) are breathing tests that are used to: Measure how well your lungs work. Find out what is causing your lung problems. Find the best treatment for you. You may have PFTs: If you have a condition that affects your lungs, such as asthma or chronic obstructive pulmonary disease (COPD). To watch for changes in your lung function over time if you have a long-term (chronic) lung disease. If you are an it trainer. PFTs check the effects of being exposed to chemicals over a long period of time. To check lung function: Before having surgery or other procedures. If you smoke. To check if prescribed medicines or treatments are helping your lungs. Tell a health care provider about: Any allergies you have. All medicines you are taking, including inhaler or nebulizer medicines, vitamins, herbs, eye drops, creams, and over-the-counter medicines. Any bleeding problems you have. Any surgeries you have had, especially recent surgery of the eye, abdomen, or  chest. These can make PFTs difficult or unsafe. Any medical conditions you have, including chest pain or heart problems, tuberculosis, or respiratory infections such as pneumonia, a cold, or the flu. Any fear of being in closed spaces (claustrophobia). Some of your tests may be in a closed space. What are the risks? Your health care provider will talk with you about risks. These may include: Feeling light-headed due to fast, deep breathing known as overbreathing or hyperventilation. An asthma attack from deep breathing. What happens before the test? Take over-the-counter and prescription medicines only as told by your health care provider. If you take inhaler or nebulizer medicines, ask your health care provider which medicines you should take on the day of your testing. Some inhaler medicines may interfere with PFTs if they are taken shortly before the tests. Follow instructions from your health care provider about what you may eat and drink. These may include: Avoiding eating large meals. Avoiding using caffeine before the testing. Not drinkingalcohol for up to 4 hours before the test. Do not use any products that contain nicotine or tobacco for up to 4 hours before your test. These products include cigarettes, chewing tobacco, and vaping devices, such as e-cigarettes. These can affect your test results. If you need help quitting, ask your health care provider. Wear comfortable clothing that will not get in the way of your breathing. Avoid exercise that takes a lot of effort (strenuous exercise) for at least 30 minutes before the test. What happens during the test?  You will be given: A soft noseclip to wear. This allows all of your breaths to go through your mouth instead of your nose. A germ-free (sterile) mouthpiece. It will be attached to a spirometer machine that measures your breathing. You will be asked to do breathing exercises. The exercises will be done by breathing in (inhaling) and  breathing out (exhaling). You may have to repeat the exercises many times before the testing is complete. You will need to follow instructions exactly as told to get accurate results. Make sure to blow as hard and as fast  as you can when you are told to do so. You may be given a medicine called a bronchodilator. This makes the small air passages in your lungs larger so you can breathe easier. The tests will be repeated after the medicine takes effect. You will be watched for any problems, such as feeling faint or dizzy, or having trouble breathing. The procedure may vary among health care providers and hospitals. What can I expect after the test? Your results will be compared with the expected lung function of someone with healthy lungs who is similar to you in several ways. These ways include age, sex, height, weight, and race or ethnicity. This is done to show how your lung function compares with normal lung function (percent predicted). The percent predicted helps your health care provider know if your lung function is normal or not. If you have had PFTs done before, your health care provider will compare your current results with past results. This shows if your lung function is better, worse, or the same as before. It is up to you to get the results of your procedure. Ask your health care provider, or the department that is doing the procedure, when your results will be ready. After you get your results, talk with your health care provider about treatment options, if necessary. This information is not intended to replace advice given to you by your health care provider. Make sure you discuss any questions you have with your health care provider. Document Revised: 01/04/2022 Document Reviewed: 01/04/2022 Elsevier Patient Education  2024 Elsevier Inc.  Follow-Up: At [facility], you and your health needs are our priority.  As part of our continuing mission to provide you with exceptional  heart care, our providers are all part of one team.  This team includes your primary Cardiologist (physician) and Advanced Practice Providers or APPs (Physician Assistants and Nurse Practitioners) who all work together to provide you with the care you need, when you need it.  Your next appointment:   6 month(s)  Provider:   Leilanni Halvorson, DO     Adopting a Healthy Lifestyle.  Know what a healthy weight is for you (roughly BMI <25) and aim to maintain this   Aim for 7+ servings of fruits and vegetables daily   65-80+ fluid ounces of water or unsweet tea for healthy kidneys   Limit to max 1 drink of alcohol per day; avoid smoking/tobacco   Limit animal fats in diet for cholesterol and heart health - choose grass fed whenever available   Avoid highly processed foods, and foods high in saturated/trans fats   Aim for low stress - take time to unwind and care for your mental health   Aim for 150 min of moderate intensity exercise weekly for heart health, and weights twice weekly for bone health   Aim for 7-9 hours of sleep daily   When it comes to diets, agreement about the perfect plan isnt easy to find, even among the experts. Experts at the [facility] [employer] developed an idea known as the Healthy Eating Plate. Just imagine a plate divided into logical, healthy portions.   The emphasis is on diet quality:   Load up on vegetables and fruits - one-half of your plate: Aim for color and variety, and remember that potatoes dont count.   Go for whole grains - one-quarter of your plate: Whole wheat, barley, wheat berries, quinoa, oats, brown rice, and foods made with them. If you want pasta,  go with whole wheat pasta.   Protein power - one-quarter of your plate: Fish, chicken, beans, and nuts are all healthy, versatile protein sources. Limit red meat.   The diet, however, does go beyond the plate, offering a few other suggestions.   Use healthy plant oils, such as olive,  canola, soy, corn, sunflower and peanut. Check the labels, and avoid partially hydrogenated oil, which have unhealthy trans fats.   If youre thirsty, drink water. Coffee and tea are good in moderation, but skip sugary drinks and limit milk and dairy products to one or two daily servings.   The type of carbohydrate in the diet is more important than the amount. Some sources of carbohydrates, such as vegetables, fruits, whole grains, and beans-are healthier than others.   Finally, stay active  Signed, Dub Huntsman, DO  05/19/2024 8:13 AM    [facility]

## 2024-05-16 NOTE — Patient Instructions (Addendum)
 Medication Instructions:  Your physician has recommended you make the following change in your medication:  START: Potassium 20 mEq once weekly START: Torsemide 20 mg once weekly *If you need a refill on your cardiac medications before your next appointment, please call your pharmacy*  Lab Work: CMET, Mag in 2 weeks.  If you have labs (blood work) drawn today and your tests are completely normal, you will receive your results only by: MyChart Message (if you have MyChart) OR A paper copy in the mail If you have any lab test that is abnormal or we need to change your treatment, we will call you to review the results.  Testing/Procedures: Pulmonary Function Tests Pulmonary function tests (PFTs) are breathing tests that are used to: Measure how well your lungs work. Find out what is causing your lung problems. Find the best treatment for you. You may have PFTs: If you have a condition that affects your lungs, such as asthma or chronic obstructive pulmonary disease (COPD). To watch for changes in your lung function over time if you have a long-term (chronic) lung disease. If you are an it trainer. PFTs check the effects of being exposed to chemicals over a long period of time. To check lung function: Before having surgery or other procedures. If you smoke. To check if prescribed medicines or treatments are helping your lungs. Tell a health care provider about: Any allergies you have. All medicines you are taking, including inhaler or nebulizer medicines, vitamins, herbs, eye drops, creams, and over-the-counter medicines. Any bleeding problems you have. Any surgeries you have had, especially recent surgery of the eye, abdomen, or chest. These can make PFTs difficult or unsafe. Any medical conditions you have, including chest pain or heart problems, tuberculosis, or respiratory infections such as pneumonia, a cold, or the flu. Any fear of being in closed spaces  (claustrophobia). Some of your tests may be in a closed space. What are the risks? Your health care provider will talk with you about risks. These may include: Feeling light-headed due to fast, deep breathing known as overbreathing or hyperventilation. An asthma attack from deep breathing. What happens before the test? Take over-the-counter and prescription medicines only as told by your health care provider. If you take inhaler or nebulizer medicines, ask your health care provider which medicines you should take on the day of your testing. Some inhaler medicines may interfere with PFTs if they are taken shortly before the tests. Follow instructions from your health care provider about what you may eat and drink. These may include: Avoiding eating large meals. Avoiding using caffeine before the testing. Not drinkingalcohol for up to 4 hours before the test. Do not use any products that contain nicotine or tobacco for up to 4 hours before your test. These products include cigarettes, chewing tobacco, and vaping devices, such as e-cigarettes. These can affect your test results. If you need help quitting, ask your health care provider. Wear comfortable clothing that will not get in the way of your breathing. Avoid exercise that takes a lot of effort (strenuous exercise) for at least 30 minutes before the test. What happens during the test?  You will be given: A soft noseclip to wear. This allows all of your breaths to go through your mouth instead of your nose. A germ-free (sterile) mouthpiece. It will be attached to a spirometer machine that measures your breathing. You will be asked to do breathing exercises. The exercises will be done by breathing in (inhaling) and breathing  out (exhaling). You may have to repeat the exercises many times before the testing is complete. You will need to follow instructions exactly as told to get accurate results. Make sure to blow as hard and as fast as you can  when you are told to do so. You may be given a medicine called a bronchodilator. This makes the small air passages in your lungs larger so you can breathe easier. The tests will be repeated after the medicine takes effect. You will be watched for any problems, such as feeling faint or dizzy, or having trouble breathing. The procedure may vary among health care providers and hospitals. What can I expect after the test? Your results will be compared with the expected lung function of someone with healthy lungs who is similar to you in several ways. These ways include age, sex, height, weight, and race or ethnicity. This is done to show how your lung function compares with normal lung function (percent predicted). The percent predicted helps your health care provider know if your lung function is normal or not. If you have had PFTs done before, your health care provider will compare your current results with past results. This shows if your lung function is better, worse, or the same as before. It is up to you to get the results of your procedure. Ask your health care provider, or the department that is doing the procedure, when your results will be ready. After you get your results, talk with your health care provider about treatment options, if necessary. This information is not intended to replace advice given to you by your health care provider. Make sure you discuss any questions you have with your health care provider. Document Revised: 01/04/2022 Document Reviewed: 01/04/2022 Elsevier Patient Education  2024 Elsevier Inc.  Follow-Up: At [facility], you and your health needs are our priority.  As part of our continuing mission to provide you with exceptional heart care, our providers are all part of one team.  This team includes your primary Cardiologist (physician) and Advanced Practice Providers or APPs (Physician Assistants and Nurse Practitioners) who all work together to provide you  with the care you need, when you need it.  Your next appointment:   6 month(s)  Provider:   Kardie Tobb, DO

## 2024-05-17 DIAGNOSIS — L578 Other skin changes due to chronic exposure to nonionizing radiation: Secondary | ICD-10-CM | POA: Diagnosis not present

## 2024-05-17 DIAGNOSIS — C4401 Basal cell carcinoma of skin of lip: Secondary | ICD-10-CM | POA: Diagnosis not present

## 2024-05-17 DIAGNOSIS — L814 Other melanin hyperpigmentation: Secondary | ICD-10-CM | POA: Diagnosis not present

## 2024-05-17 DIAGNOSIS — L988 Other specified disorders of the skin and subcutaneous tissue: Secondary | ICD-10-CM | POA: Diagnosis not present

## 2024-05-28 DIAGNOSIS — Z79899 Other long term (current) drug therapy: Secondary | ICD-10-CM | POA: Diagnosis not present

## 2024-05-29 ENCOUNTER — Encounter: Payer: Self-pay | Admitting: Hematology

## 2024-05-29 ENCOUNTER — Ambulatory Visit: Payer: Self-pay | Admitting: Cardiology

## 2024-05-29 LAB — COMPREHENSIVE METABOLIC PANEL WITH GFR
ALT: 13 IU/L (ref 0–44)
AST: 19 IU/L (ref 0–40)
Albumin: 3.8 g/dL (ref 3.8–4.8)
Alkaline Phosphatase: 69 IU/L (ref 47–123)
BUN/Creatinine Ratio: 14 (ref 10–24)
BUN: 14 mg/dL (ref 8–27)
Bilirubin Total: 0.3 mg/dL (ref 0.0–1.2)
CO2: 25 mmol/L (ref 20–29)
Calcium: 9.1 mg/dL (ref 8.6–10.2)
Chloride: 103 mmol/L (ref 96–106)
Creatinine, Ser: 0.97 mg/dL (ref 0.76–1.27)
Globulin, Total: 2.7 g/dL (ref 1.5–4.5)
Glucose: 83 mg/dL (ref 70–99)
Potassium: 5.3 mmol/L — ABNORMAL HIGH (ref 3.5–5.2)
Sodium: 141 mmol/L (ref 134–144)
Total Protein: 6.5 g/dL (ref 6.0–8.5)
eGFR: 79 mL/min/1.73 (ref >59)

## 2024-05-29 LAB — MAGNESIUM: Magnesium: 2.2 mg/dL (ref 1.6–2.3)

## 2024-05-31 DIAGNOSIS — Z08 Encounter for follow-up examination after completed treatment for malignant neoplasm: Secondary | ICD-10-CM | POA: Diagnosis not present

## 2024-05-31 DIAGNOSIS — Z85828 Personal history of other malignant neoplasm of skin: Secondary | ICD-10-CM | POA: Diagnosis not present

## 2024-06-04 ENCOUNTER — Inpatient Hospital Stay (HOSPITAL_COMMUNITY): Admission: RE | Admit: 2024-06-04 | Discharge: 2024-06-04 | Attending: Cardiology

## 2024-06-04 DIAGNOSIS — R0609 Other forms of dyspnea: Secondary | ICD-10-CM

## 2024-06-04 LAB — PULMONARY FUNCTION TEST
DL/VA % pred: 110 %
DL/VA: 4.28 ml/min/mmHg/L
DLCO unc % pred: 63 %
DLCO unc: 16.09 ml/min/mmHg
FEF 25-75 Post: 3.26 L/s
FEF 25-75 Pre: 2.86 L/s
FEF2575-%Change-Post: 14 %
FEF2575-%Pred-Post: 154 %
FEF2575-%Pred-Pre: 134 %
FEV1-%Change-Post: 2 %
FEV1-%Pred-Post: 64 %
FEV1-%Pred-Pre: 62 %
FEV1-Post: 1.95 L
FEV1-Pre: 1.91 L
FEV1FVC-%Change-Post: 1 %
FEV1FVC-%Pred-Pre: 123 %
FEV6-%Change-Post: 0 %
FEV6-%Pred-Post: 54 %
FEV6-%Pred-Pre: 54 %
FEV6-Post: 2.16 L
FEV6-Pre: 2.15 L
FEV6FVC-%Pred-Post: 106 %
FEV6FVC-%Pred-Pre: 106 %
FVC-%Change-Post: 0 %
FVC-%Pred-Post: 50 %
FVC-%Pred-Pre: 50 %
FVC-Post: 2.16 L
FVC-Pre: 2.15 L
Post FEV1/FVC ratio: 90 %
Post FEV6/FVC ratio: 100 %
Pre FEV1/FVC ratio: 89 %
Pre FEV6/FVC Ratio: 100 %
RV % pred: 81 %
RV: 2.19 L
TLC % pred: 65 %
TLC: 4.73 L

## 2024-06-04 MED ORDER — ALBUTEROL SULFATE (2.5 MG/3ML) 0.083% IN NEBU
2.5000 mg | INHALATION_SOLUTION | Freq: Once | RESPIRATORY_TRACT | Status: AC
  Administered 2024-06-04: 2.5 mg via RESPIRATORY_TRACT

## 2024-06-12 NOTE — Telephone Encounter (Signed)
 Pt called to f/u and he also wanted to have labs drawn at the [place] office if possible. He also stated he has not heard anything from [facility] please advise

## 2024-06-18 ENCOUNTER — Other Ambulatory Visit: Payer: Self-pay | Admitting: Hematology

## 2024-06-22 DIAGNOSIS — E875 Hyperkalemia: Secondary | ICD-10-CM | POA: Diagnosis not present

## 2024-06-23 LAB — BASIC METABOLIC PANEL WITH GFR
BUN/Creatinine Ratio: 18 (ref 10–24)
BUN: 19 mg/dL (ref 8–27)
CO2: 24 mmol/L (ref 20–29)
Calcium: 9.1 mg/dL (ref 8.6–10.2)
Chloride: 104 mmol/L (ref 96–106)
Creatinine, Ser: 1.04 mg/dL (ref 0.76–1.27)
Glucose: 89 mg/dL (ref 70–99)
Potassium: 4.4 mmol/L (ref 3.5–5.2)
Sodium: 142 mmol/L (ref 134–144)
eGFR: 73 mL/min/1.73

## 2024-06-25 ENCOUNTER — Inpatient Hospital Stay: Attending: Physician Assistant

## 2024-06-25 ENCOUNTER — Ambulatory Visit: Payer: Self-pay | Admitting: Nurse Practitioner

## 2024-06-25 DIAGNOSIS — Z95828 Presence of other vascular implants and grafts: Secondary | ICD-10-CM

## 2024-06-25 DIAGNOSIS — C182 Malignant neoplasm of ascending colon: Secondary | ICD-10-CM | POA: Insufficient documentation

## 2024-06-25 LAB — CEA (ACCESS): CEA (CHCC): 3.63 ng/mL (ref 0.00–5.00)

## 2024-06-25 LAB — CMP (CANCER CENTER ONLY)
ALT: 12 U/L (ref 0–44)
AST: 21 U/L (ref 15–41)
Albumin: 3.8 g/dL (ref 3.5–5.0)
Alkaline Phosphatase: 65 U/L (ref 38–126)
Anion gap: 8 (ref 5–15)
BUN: 11 mg/dL (ref 8–23)
CO2: 26 mmol/L (ref 22–32)
Calcium: 8.7 mg/dL — ABNORMAL LOW (ref 8.9–10.3)
Chloride: 107 mmol/L (ref 98–111)
Creatinine: 1.06 mg/dL (ref 0.61–1.24)
GFR, Estimated: >60 mL/min
Glucose, Bld: 124 mg/dL — ABNORMAL HIGH (ref 70–99)
Potassium: 4.3 mmol/L (ref 3.5–5.1)
Sodium: 141 mmol/L (ref 135–145)
Total Bilirubin: 0.4 mg/dL (ref 0.0–1.2)
Total Protein: 6.7 g/dL (ref 6.5–8.1)

## 2024-06-25 LAB — CBC WITH DIFFERENTIAL (CANCER CENTER ONLY)
Abs Immature Granulocytes: 0.01 K/uL (ref 0.00–0.07)
Basophils Absolute: 0.1 K/uL (ref 0.0–0.1)
Basophils Relative: 1 %
Eosinophils Absolute: 0.2 K/uL (ref 0.0–0.5)
Eosinophils Relative: 3 %
HCT: 41.9 % (ref 39.0–52.0)
Hemoglobin: 14.1 g/dL (ref 13.0–17.0)
Immature Granulocytes: 0 %
Lymphocytes Relative: 15 %
Lymphs Abs: 0.9 K/uL (ref 0.7–4.0)
MCH: 31.6 pg (ref 26.0–34.0)
MCHC: 33.7 g/dL (ref 30.0–36.0)
MCV: 93.9 fL (ref 80.0–100.0)
Monocytes Absolute: 0.9 K/uL (ref 0.1–1.0)
Monocytes Relative: 14 %
Neutro Abs: 4 K/uL (ref 1.7–7.7)
Neutrophils Relative %: 67 %
Platelet Count: 163 K/uL (ref 150–400)
RBC: 4.46 MIL/uL (ref 4.22–5.81)
RDW: 13.9 % (ref 11.5–15.5)
WBC Count: 6 K/uL (ref 4.0–10.5)
nRBC: 0 % (ref 0.0–0.2)

## 2024-07-05 ENCOUNTER — Ambulatory Visit (INDEPENDENT_AMBULATORY_CARE_PROVIDER_SITE_OTHER): Admitting: Internal Medicine

## 2024-07-05 ENCOUNTER — Encounter: Payer: Self-pay | Admitting: Internal Medicine

## 2024-07-05 VITALS — BP 134/68 | HR 66 | Temp 97.8°F | Ht 71.0 in | Wt 259.8 lb

## 2024-07-05 DIAGNOSIS — J45991 Cough variant asthma: Secondary | ICD-10-CM | POA: Insufficient documentation

## 2024-07-05 DIAGNOSIS — R0609 Other forms of dyspnea: Secondary | ICD-10-CM | POA: Diagnosis not present

## 2024-07-05 NOTE — Assessment & Plan Note (Addendum)
 Onset x years worse in spring and fall - 07/05/2024  After extensive coaching inhaler device,  effectiveness =    50% from a baseline of < 25% with hfa - 07/05/2024 max rx for gerd plus noct H1 4-8 mg hs   Response to AIR Supra suggests asthma until  hfa technique  reviewed and realized not much was getting to lung so for now rec >>> saba prn and work on technique   I spent extra time with pt today reviewing appropriate use of albuterol  for prn use on exertion with the following points: 1) saba is for relief of sob that does not improve by walking a slower pace or resting but rather if the pt does not improve after trying this first. 2) If the pt is convinced, as many are, that saba helps recover from activity faster then it's easy to tell if this is the case by re-challenging : ie stop, take the inhaler, then p 5 minutes try the exact same activity (intensity of workload) that just caused the symptoms and see if they are substantially diminished or not after saba 3) if there is an activity that reproducibly causes the symptoms, try the saba 15 min before the activity on alternate days   If in fact the saba really does help, then fine to continue to use it prn but advised may need to look closer at the maintenance regimen being used to achieve better control of airways disease with exertion.   >>> Max for GERD and noct pnds with 1st gen H1 blockers per guidelines    >>> remember Entresto   may contibute to cough (as below outllined) an may not be needed based on his last echo but for now would leave entresto  as is unless some other reason so stop it.   The sacubitrilat component of entresto  is not and ACEi but it does lead to higher levels of bradykinin (the culprit in ACEi related cough) because it reduces Neprilysin based clearance of bradykinin. The typical symptoms are dry daytime cough (9% per PI) or complaints of a new sensation of globus or excess PNDS.

## 2024-07-05 NOTE — Patient Instructions (Addendum)
 GERD (REFLUX)  is an extremely common cause of respiratory symptoms just like yours , many times with no obvious heartburn at all.    It can be treated with medication, but also with lifestyle changes including elevation of the head of your bed (ideally with 6 -8inch blocks under the headboard of your bed),  Smoking cessation, avoidance of late meals, excessive alcohol, and avoid fatty foods, chocolate, peppermint, colas, red wine, and acidic juices such as orange juice.  NO MINT OR MENTHOL PRODUCTS SO NO COUGH DROPS  USE SUGARLESS CANDY INSTEAD (Jolley ranchers or Stover's or Life Savers) or even ice chips will also do - the key is to swallow to prevent all throat clearing. NO OIL BASED VITAMINS - use powdered substitutes.  Avoid fish oil when coughing.   Pantoprazole  (protonix ) 40 mg   Take  30-60 min before first meal of the day and Pepcid  (famotidine )  20 mg after supper until return to office - this is the best way to tell whether stomach acid is contributing to your problem.    If still coughing at bedtime add  CHLORPHENIRAMINE  4 mg  (Allergy Relief 4mg   start with just a dose or two an hour before bedtime (with the Pepcid  above)  to see if cough at bedtime resolves  .Use your albuterol  as a rescue medication to be used if you can't catch stop coughing or recover  your breath by resting, slowing your pace,  or doing a relaxed purse lip breathing pattern.  - Ok to use up to 2 puffs  every 4 hours if you must but call for  appointment if use goes up over your usual need - Don't leave home without it !!  (think of it like a spare tire or starter fluid for your car)   Also  Ok to try albuterol  15 min before an activity (on alternating days)  that you know would usually make you short of breath and see if it makes any difference and if makes none then don't take albuterol  after activity unless you can't catch your breath as this means it's the resting that helps, not the albuterol .  Discuss with  Dr Sheena the fluid in your legs    Please schedule a follow up office visit in 6-8  weeks, call sooner if needed  - remember to bring inhaler with you                  .

## 2024-07-05 NOTE — Progress Notes (Signed)
 "  Thomas Knight, male    DOB: 07/14/1945   MRN: [id number]   Brief patient profile:  53 yowm   never smoker  Icard pt with met colon ca to lung referred to pulmonary clinic 07/05/2024 by hfa for cough all his life mostly spring and summer and new leg swelling doe         History of Present Illness  07/05/2024  Pulmonary/ 1st office eval/[facility] on Entresto   Chief Complaint  Patient presents with   Consult    Review PFT from 06/04/2024 by PCP Dr. Sheena.  Chronic cough and has had SOB and wheezing in last 6 weeks  Dyspnea:  flat surface nl  pace but can't do steps  Cough: sporadic/ worse with deep breath and hold / freq throat clearing  Sleep: flat with 2 pillows and immediate cough  x years  SABA use: used air supra in fall seemed to help breathing and odcoughig 02 [id number]     No obvious day to day or daytime pattern/variability or assoc excess/ purulent sputum or mucus plugs or hemoptysis or cp or chest tightness,  or overt sinus or hb symptoms.    Also denies any obvious fluctuation of symptoms with weather or environmental changes or other aggravating or alleviating factors except as outlined above   No unusual exposure hx or h/o childhood pna/ asthma or knowledge of premature birth.  Current Allergies, Complete Past Medical History, Past Surgical History, Family History, and Social History were reviewed in [employer] record.  ROS  The following are not active complaints unless bolded Hoarseness, sore throat, dysphagia, dental problems, itching, sneezing,  nasal congestion or discharge of excess mucus or purulent secretions, ear ache,   fever, chills, sweats, unintended wt loss or wt gain, classically pleuritic or exertional cp,  orthopnea pnd or arm/hand swelling  or leg swelling, presyncope, palpitations, abdominal pain, anorexia, nausea, vomiting, diarrhea  or change in bowel habits or change in bladder habits, change in stools or change in urine, dysuria,  hematuria,  rash, arthralgias, visual complaints, headache, numbness, weakness or ataxia or problems with walking or coordination,  change in mood or  memory.             Outpatient Medications Prior to Visit  Medication Sig Dispense Refill   acetaminophen  (TYLENOL ) 500 MG tablet Take 500-1,000 mg by mouth every 6 (six) hours as needed (pain.).     amLODipine  (NORVASC ) 5 MG tablet Take 1 tablet (5 mg total) by mouth 2 (two) times daily. 180 tablet 3   ascorbic acid  (VITAMIN C) 500 MG tablet Take 500 mg by mouth daily.     Calcium  Carbonate-Vit D-Min (CALCIUM  600+D3 PLUS MINERALS) 600-800 MG-UNIT TABS Take 1 tablet by mouth daily.     carvedilol  (COREG ) 12.5 MG tablet Take 1 tablet (12.5 mg total) by mouth 2 (two) times daily. 180 tablet 3   Cholecalciferol  (D3-1000) 25 MCG (1000 UT) capsule Take 1,000 Units by mouth daily.     diphenoxylate -atropine  (LOMOTIL ) 2.5-0.025 MG tablet Take 1 tablet by mouth 4 (four) times daily as needed for diarrhea or loose stools. 30 tablet 1   ELIQUIS  5 MG TABS tablet Take 1 tablet (5 mg total) by mouth 2 (two) times daily. 180 tablet 1   lidocaine -prilocaine  (EMLA ) cream Apply 1 Application topically as needed (every 3 months).     Misc Natural Products (JOINT SUPPORT PO) Take 1 tablet by mouth daily. Instaflex Advanced Joint Support  nitroGLYCERIN  (NITROSTAT ) 0.4 MG SL tablet Place 1 tablet (0.4 mg total) under the tongue every 5 (five) minutes as needed. 25 tablet 5   sacubitril -valsartan  (ENTRESTO ) 97-103 MG Take 1 tablet by mouth 2 (two) times daily. 180 tablet 3   vitamin B-12 (CYANOCOBALAMIN ) 1000 MCG tablet Take 1,000 mcg by mouth every evening.     Zinc 50 MG CAPS Take 50 mg by mouth every evening.     potassium chloride  SA (KLOR-CON  M20) 20 MEQ tablet Take 1 tablet (20 mEq total) by mouth once a week. 18 tablet 3   torsemide  (DEMADEX ) 20 MG tablet Take 1 tablet (20 mg total) by mouth once a week. 18 tablet 3   No facility-administered medications  prior to visit.    Past Medical History:  Diagnosis Date   Bilateral leg edema 08/24/2019   Cancer (HCC)    skin ca, colon   Chronic cough    Chronic pain of left knee 12/01/2015   COVID-19    06/2019   Dysrhythmia    afib  was cardioverted   Essential hypertension 08/24/2019   Mixed hyperlipidemia 08/24/2019   Obesity (BMI 30-39.9) 08/24/2019   Persistent atrial fibrillation (HCC) 08/24/2019      Objective:     BP 134/68   Pulse 66   Temp 97.8 F (36.6 C) (Temporal)   Ht 5' 11 (1.803 m)   Wt 259 lb 12.8 oz (117.8 kg)   SpO2 94% Comment: room air  BMI 36.23 kg/m   SpO2: 94 % (room air) pleasant amb mod obese (by BMI ) wm nad   HEENT : Oropharynx  min erythema/ no excess pnd/ cobblestoning      Nasal turbinates mod edema s polyps    NECK :  without  apparent JVD/ palpable Nodes/TM    LUNGS: no acc muscle use,  Nl contour chest which is clear to A and P bilaterally without cough on insp or exp maneuvers   CV:  RRR  no s3 or murmur or increase in P2, and  1+ pitting both LEs  ABD:  quite obes but soft and nontender   MS:  Gait nl   ext warm without deformities Or obvious joint restrictions  calf tenderness, cyanosis or clubbing    SKIN: warm and dry without lesions    NEURO:  alert, approp, nl sensorium with  no motor or cerebellar deficits apparent.       CT chest with contrast 05/07/24 1. Similar right perihilar masslike consolidation with bronchiectasis/bronchiolectasis and associated architectural distortion and patchy parenchymal densities compatible with posttreatment change. 2. Previously described left lower lobe pulmonary nodule is obscured by new perihilar irregular consolidations with associated architectural distortion and ground-glass, likely reflecting interval posttreatment change. 3. Stable solid 3 mm right middle lobe pulmonary nodule.   Assessment   Assessment & Plan Cough variant asthma Onset x years worse in spring and fall -  07/05/2024  After extensive coaching inhaler device,  effectiveness =    50% from a baseline of < 25% with hfa - 07/05/2024 max rx for gerd plus noct H1 4-8 mg hs   Response to AIR Supra suggests asthma until  hfa technique  reviewed and realized not much was getting to lung so for now rec >>> saba prn and work on technique   I spent extra time with pt today reviewing appropriate use of albuterol  for prn use on exertion with the following points: 1) saba is for relief of sob that does not  improve by walking a slower pace or resting but rather if the pt does not improve after trying this first. 2) If the pt is convinced, as many are, that saba helps recover from activity faster then it's easy to tell if this is the case by re-challenging : ie stop, take the inhaler, then p 5 minutes try the exact same activity (intensity of workload) that just caused the symptoms and see if they are substantially diminished or not after saba 3) if there is an activity that reproducibly causes the symptoms, try the saba 15 min before the activity on alternate days   If in fact the saba really does help, then fine to continue to use it prn but advised may need to look closer at the maintenance regimen being used to achieve better control of airways disease with exertion.   >>> Max for GERD and noct pnds with 1st gen H1 blockers per guidelines    >>> remember Entresto   may contibute to cough (as below outllined) an may not be needed based on his last echo but for now would leave entresto  as is unless some other reason so stop it.   The sacubitrilat component of entresto  is not and ACEi but it does lead to higher levels of bradykinin (the culprit in ACEi related cough) because it reduces Neprilysin based clearance of bradykinin. The typical symptoms are dry daytime cough (9% per PI) or complaints of a new sensation of globus or excess PNDS.    DOE (dyspnea on exertion) Never smoker Echo 01/2024  mild LVH with nl LA   PFT's  06/04/24  FEV1 1.95 (64 % ) ratio 0.90  p 2 % improvement from saba p 0 prior to study with DLCO  16 (63%)   and FV curve nl  and ERV 15 at wt 254  - 07/05/2024   Walked on RA  x  3  lap(s) =  approx 750  ft  @ brisk pace, stopped due to end of study with doe on last lap but  with lowest 02 sats 90%     No evidence of ILD related to RT or chemo - most likely   this is mild  diastolic dysfunction plus obesity/ conditioning at this point so rec  >>>  sub max ex daily as tolerated and f/u in 6-8  weeks   Each maintenance medication was reviewed in detail including emphasizing most importantly the difference between maintenance and prns and under what circumstances the prns are to be triggered using an action plan format where appropriate.  Total time for H and P, chart review, counseling, reviewing hfa device(s) , directly observing portions of ambulatory 02 saturation study/ and generating customized AVS unique to this office visit / same day charting = 65 min new pt eval                  AVS  Patient Instructions  GERD (REFLUX)  is an extremely common cause of respiratory symptoms just like yours , many times with no obvious heartburn at all.    It can be treated with medication, but also with lifestyle changes including elevation of the head of your bed (ideally with 6 -8inch blocks under the headboard of your bed),  Smoking cessation, avoidance of late meals, excessive alcohol, and avoid fatty foods, chocolate, peppermint, colas, red wine, and acidic juices such as orange juice.  NO MINT OR MENTHOL PRODUCTS SO NO COUGH DROPS  USE SUGARLESS CANDY INSTEAD (Jolley ranchers  or Stover's or Life Savers) or even ice chips will also do - the key is to swallow to prevent all throat clearing. NO OIL BASED VITAMINS - use powdered substitutes.  Avoid fish oil when coughing.   Pantoprazole  (protonix ) 40 mg   Take  30-60 min before first meal of the day and Pepcid  (famotidine )  20 mg after supper  until return to office - this is the best way to tell whether stomach acid is contributing to your problem.    If still coughing at bedtime add  CHLORPHENIRAMINE  4 mg  (Allergy Relief 4mg   start with just a dose or two an hour before bedtime (with the Pepcid  above)  to see if cough at bedtime resolves  .Use your albuterol  as a rescue medication to be used if you can't catch stop coughing or recover  your breath by resting, slowing your pace,  or doing a relaxed purse lip breathing pattern.  - Ok to use up to 2 puffs  every 4 hours if you must but call for  appointment if use goes up over your usual need - Don't leave home without it !!  (think of it like a spare tire or starter fluid for your car)   Also  Ok to try albuterol  15 min before an activity (on alternating days)  that you know would usually make you short of breath and see if it makes any difference and if makes none then don't take albuterol  after activity unless you can't catch your breath as this means it's the resting that helps, not the albuterol .  Discuss with Dr Sheena the fluid in your legs    Please schedule a follow up office visit in 6-8  weeks, call sooner if needed  - remember to bring inhaler with you                  .   Ozell America, MD 07/05/2024       "

## 2024-07-05 NOTE — Assessment & Plan Note (Addendum)
 Never smoker Echo 01/2024  mild LVH with nl LA  PFT's  06/04/24  FEV1 1.95 (64 % ) ratio 0.90  p 2 % improvement from saba p 0 prior to study with DLCO  16 (63%)   and FV curve nl  and ERV 15 at wt 254  - 07/05/2024   Walked on RA  x  3  lap(s) =  approx 750  ft  @ brisk pace, stopped due to end of study with doe on last lap but  with lowest 02 sats 90%     No evidence of ILD related to RT or chemo - most likely   this is mild  diastolic dysfunction plus obesity/ conditioning at this point so rec  >>>  sub max ex daily as tolerated and f/u in 6-8  weeks   Each maintenance medication was reviewed in detail including emphasizing most importantly the difference between maintenance and prns and under what circumstances the prns are to be triggered using an action plan format where appropriate.  Total time for H and P, chart review, counseling, reviewing hfa device(s) , directly observing portions of ambulatory 02 saturation study/ and generating customized AVS unique to this office visit / same day charting = 65 min new pt eval

## 2024-07-06 ENCOUNTER — Other Ambulatory Visit: Payer: Self-pay

## 2024-07-06 ENCOUNTER — Telehealth: Payer: Self-pay | Admitting: Cardiology

## 2024-07-06 ENCOUNTER — Telehealth: Payer: Self-pay

## 2024-07-06 DIAGNOSIS — K219 Gastro-esophageal reflux disease without esophagitis: Secondary | ICD-10-CM

## 2024-07-06 MED ORDER — PANTOPRAZOLE SODIUM 40 MG PO TBEC: DELAYED_RELEASE_TABLET | ORAL | 5 refills | Status: AC

## 2024-07-06 MED ORDER — FAMOTIDINE 20 MG PO TABS: ORAL_TABLET | ORAL | 5 refills | Status: AC

## 2024-07-06 MED ORDER — FAMOTIDINE 20 MG PO TABS: ORAL_TABLET | ORAL | 5 refills | Status: DC

## 2024-07-06 NOTE — Addendum Note (Signed)
 Addended by: Khaila Velarde M on: 07/06/2024 02:29 PM   Modules accepted: Orders

## 2024-07-06 NOTE — Telephone Encounter (Signed)
 Pt c/o swelling/edema: STAT if pt has developed SOB within 24 hours  If swelling, where is the swelling located?   Ankles and top of feet  How much weight have you gained and in what time span?   Yes - 5 pounds  Have you gained 2 pounds in a day or 5 pounds in a week?   No  Do you have a log of your daily weights (if so, list)?   Some 259 - 1/8  Patient noted his normal weight is 254 pounds  Are you currently taking a fluid pill?  No  Are you currently SOB?    SOB  Have you traveled recently in a car or plane for an extended period of time?   Patient wants to know next steps regarding the swelling in his ankles and feet and stated his BP yesterday was 134/68, HR 66 and Oxygen 94%.  Patient also stated he did see his pulmonologist (Dr. Darlean) and he was diagnosed with Thalia.  Patient stated Dr. Darlean prescribed - Protonix , Pepcid , Chlorpheniramine.

## 2024-07-06 NOTE — Telephone Encounter (Signed)
 Spoke with Pt. Pt states he has swelling on top of feet and in ankles. Denies pain, redness and states they are equal on both sides. Tried wearing compression stockings, pulling feet up, and decreasing salt intake with little improvement. Does not currently take fluid pill. Can not get dress shoes on. Pt wanted to remind Dr Sheena of severe react to lasix .  Pt also wanted Dr Sheena to know: Dr Darlean says pt has GERD.

## 2024-07-06 NOTE — Telephone Encounter (Signed)
 Copied from CRM [id number]. Topic: Clinical - Prescription Issue >> Jul 06, 2024 10:54 AM Isabell A wrote: Reason for CRM: Patient states pharmacy never received any prescriptions he was prescribed from his visit yesterday.    Pharmacy [facility] - [place], [place] - [address], [place] [address] Phone: [phone]  Fax: [phone]  Will send in rx per chart note instructions per Dr. Darlean:  Pantoprazole  (protonix ) 40 mg   Take  30-60 min before first meal of the day and Pepcid  (famotidine )  20 mg after supper until return to office.  Will place as orders only encounter.  Sent in rx to patients pharmacy.

## 2024-07-09 NOTE — Telephone Encounter (Signed)
 Spoke with pt. Pt states he did not have a reaction with the torsemide  and potassium together. He is still having swelling. Advised pt I would get back with Dr Sheena for advisement and we would reach back out to him. Pt stated understanding.

## 2024-07-10 MED ORDER — POTASSIUM CHLORIDE CRYS ER 10 MEQ PO TBCR: 10.0000 meq | EXTENDED_RELEASE_TABLET | Freq: Every day | ORAL | 3 refills | Status: DC

## 2024-07-10 MED ORDER — TORSEMIDE 20 MG PO TABS: 20.0000 mg | ORAL_TABLET | Freq: Every day | ORAL | 3 refills | Status: DC

## 2024-07-10 NOTE — Addendum Note (Signed)
 Addended by: MELIDA ROLIN HERO on: 07/10/2024 09:45 AM   Modules accepted: Orders

## 2024-07-11 NOTE — Telephone Encounter (Signed)
 Patient called to follow-up on next steps regarding his medication and swelling in his ankles and tops of his feet.

## 2024-07-12 MED ORDER — POTASSIUM CHLORIDE CRYS ER 10 MEQ PO TBCR: 10.0000 meq | EXTENDED_RELEASE_TABLET | ORAL | 3 refills | Status: DC

## 2024-07-12 MED ORDER — POTASSIUM CHLORIDE CRYS ER 20 MEQ PO TBCR: 20.0000 meq | EXTENDED_RELEASE_TABLET | ORAL | 3 refills | Status: AC

## 2024-07-12 MED ORDER — TORSEMIDE 20 MG PO TABS: 20.0000 mg | ORAL_TABLET | ORAL | 3 refills | Status: AC

## 2024-07-12 NOTE — Telephone Encounter (Signed)
 Gave instructions per Dr Sheena to start taking Torsemide  30 every other day along side Potassium 10meq every other day. Went over the use of compression socks, elevating legs, and limiting fluid intake to reduce swelling. Verbalizes understanding.

## 2024-07-12 NOTE — Telephone Encounter (Signed)
 Spoke with pt to clarify he is to take  Torsemide  20 mg every other day and Potassium 20 mEq every other day.  Corrected prescription sent to preferred pharmacy.   Contacted [facility] to get corrected medication prepared for the patient. Spoke to staff, correct Potassium 20 mEq every other day will be filled for pt.

## 2024-07-12 NOTE — Addendum Note (Signed)
 Addended by: JOSHUA ANDREZ PARAS on: 07/12/2024 08:56 AM   Modules accepted: Orders

## 2024-07-12 NOTE — Addendum Note (Signed)
 Addended by: MELIDA ROLIN HERO on: 07/12/2024 12:35 PM   Modules accepted: Orders

## 2024-07-16 ENCOUNTER — Other Ambulatory Visit: Payer: Self-pay

## 2024-07-16 MED ORDER — CARVEDILOL 12.5 MG PO TABS: 12.5000 mg | ORAL_TABLET | Freq: Two times a day (BID) | ORAL | 3 refills | Status: AC

## 2024-07-30 NOTE — Progress Notes (Signed)
 Thomas Knight                                          MRN: [id number]   07/30/2024   The [facility] Quality Team Specialist reviewed this patient medical record for the purposes of chart review for care gap closure. The following were reviewed: abstraction for care gap closure-controlling blood pressure.    [facility] Quality Team

## 2024-08-03 ENCOUNTER — Ambulatory Visit (HOSPITAL_COMMUNITY): Admission: RE | Admit: 2024-08-03 | Source: Ambulatory Visit

## 2024-08-03 DIAGNOSIS — C182 Malignant neoplasm of ascending colon: Secondary | ICD-10-CM

## 2024-08-09 ENCOUNTER — Inpatient Hospital Stay: Attending: Physician Assistant

## 2024-08-09 ENCOUNTER — Inpatient Hospital Stay: Admitting: Hematology

## 2024-09-03 ENCOUNTER — Ambulatory Visit: Admitting: Internal Medicine
